# Patient Record
Sex: Male | Born: 1960 | Race: White | Hispanic: No | Marital: Single | Smoking: Current every day smoker
Health system: Southern US, Community
[De-identification: ages and names within clinical notes are randomized; demographics above are authoritative.]

---

## 2017-04-02 DIAGNOSIS — F1010 Alcohol abuse, uncomplicated: Secondary | ICD-10-CM | POA: Diagnosis not present

## 2017-04-02 DIAGNOSIS — K639 Disease of intestine, unspecified: Secondary | ICD-10-CM | POA: Diagnosis not present

## 2017-04-02 DIAGNOSIS — R109 Unspecified abdominal pain: Secondary | ICD-10-CM

## 2017-04-02 DIAGNOSIS — E876 Hypokalemia: Secondary | ICD-10-CM

## 2017-04-03 DIAGNOSIS — F1010 Alcohol abuse, uncomplicated: Secondary | ICD-10-CM | POA: Diagnosis not present

## 2017-04-03 DIAGNOSIS — K639 Disease of intestine, unspecified: Secondary | ICD-10-CM | POA: Diagnosis not present

## 2017-04-03 DIAGNOSIS — R109 Unspecified abdominal pain: Secondary | ICD-10-CM | POA: Diagnosis not present

## 2017-04-03 DIAGNOSIS — E876 Hypokalemia: Secondary | ICD-10-CM | POA: Diagnosis not present

## 2017-04-04 DIAGNOSIS — E876 Hypokalemia: Secondary | ICD-10-CM | POA: Diagnosis not present

## 2017-04-04 DIAGNOSIS — R109 Unspecified abdominal pain: Secondary | ICD-10-CM | POA: Diagnosis not present

## 2017-04-04 DIAGNOSIS — K639 Disease of intestine, unspecified: Secondary | ICD-10-CM | POA: Diagnosis not present

## 2017-04-04 DIAGNOSIS — F1010 Alcohol abuse, uncomplicated: Secondary | ICD-10-CM | POA: Diagnosis not present

## 2017-04-05 DIAGNOSIS — R109 Unspecified abdominal pain: Secondary | ICD-10-CM | POA: Diagnosis not present

## 2017-04-05 DIAGNOSIS — E876 Hypokalemia: Secondary | ICD-10-CM | POA: Diagnosis not present

## 2017-04-05 DIAGNOSIS — F1010 Alcohol abuse, uncomplicated: Secondary | ICD-10-CM | POA: Diagnosis not present

## 2017-04-05 DIAGNOSIS — K639 Disease of intestine, unspecified: Secondary | ICD-10-CM | POA: Diagnosis not present

## 2017-04-06 DIAGNOSIS — E876 Hypokalemia: Secondary | ICD-10-CM | POA: Diagnosis not present

## 2017-04-06 DIAGNOSIS — R109 Unspecified abdominal pain: Secondary | ICD-10-CM | POA: Diagnosis not present

## 2017-04-06 DIAGNOSIS — F1010 Alcohol abuse, uncomplicated: Secondary | ICD-10-CM | POA: Diagnosis not present

## 2017-04-06 DIAGNOSIS — K639 Disease of intestine, unspecified: Secondary | ICD-10-CM | POA: Diagnosis not present

## 2017-04-07 DIAGNOSIS — K639 Disease of intestine, unspecified: Secondary | ICD-10-CM | POA: Diagnosis not present

## 2017-04-07 DIAGNOSIS — F1010 Alcohol abuse, uncomplicated: Secondary | ICD-10-CM | POA: Diagnosis not present

## 2017-04-07 DIAGNOSIS — E876 Hypokalemia: Secondary | ICD-10-CM | POA: Diagnosis not present

## 2017-04-07 DIAGNOSIS — R109 Unspecified abdominal pain: Secondary | ICD-10-CM | POA: Diagnosis not present

## 2017-04-08 DIAGNOSIS — K639 Disease of intestine, unspecified: Secondary | ICD-10-CM | POA: Diagnosis not present

## 2017-04-08 DIAGNOSIS — R109 Unspecified abdominal pain: Secondary | ICD-10-CM | POA: Diagnosis not present

## 2017-04-08 DIAGNOSIS — F1010 Alcohol abuse, uncomplicated: Secondary | ICD-10-CM | POA: Diagnosis not present

## 2017-04-08 DIAGNOSIS — E876 Hypokalemia: Secondary | ICD-10-CM | POA: Diagnosis not present

## 2017-04-09 DIAGNOSIS — R109 Unspecified abdominal pain: Secondary | ICD-10-CM | POA: Diagnosis not present

## 2017-04-09 DIAGNOSIS — E876 Hypokalemia: Secondary | ICD-10-CM | POA: Diagnosis not present

## 2017-04-09 DIAGNOSIS — K639 Disease of intestine, unspecified: Secondary | ICD-10-CM | POA: Diagnosis not present

## 2017-04-09 DIAGNOSIS — F1010 Alcohol abuse, uncomplicated: Secondary | ICD-10-CM | POA: Diagnosis not present

## 2017-04-10 DIAGNOSIS — F1010 Alcohol abuse, uncomplicated: Secondary | ICD-10-CM | POA: Diagnosis not present

## 2017-04-10 DIAGNOSIS — K639 Disease of intestine, unspecified: Secondary | ICD-10-CM | POA: Diagnosis not present

## 2017-04-10 DIAGNOSIS — R109 Unspecified abdominal pain: Secondary | ICD-10-CM | POA: Diagnosis not present

## 2017-04-10 DIAGNOSIS — E876 Hypokalemia: Secondary | ICD-10-CM | POA: Diagnosis not present

## 2017-04-11 ENCOUNTER — Inpatient Hospital Stay (HOSPITAL_COMMUNITY): Payer: Medicaid Other

## 2017-04-11 ENCOUNTER — Inpatient Hospital Stay (HOSPITAL_COMMUNITY)
Admission: AD | Admit: 2017-04-11 | Discharge: 2017-05-20 | DRG: 853 | Disposition: A | Discharge status: Home / Self Care | Payer: Medicaid Other | Source: Other Acute Inpatient Hospital | Attending: Internal Medicine | Admitting: Internal Medicine

## 2017-04-11 DIAGNOSIS — R0600 Dyspnea, unspecified: Secondary | ICD-10-CM

## 2017-04-11 DIAGNOSIS — J80 Acute respiratory distress syndrome: Secondary | ICD-10-CM | POA: Diagnosis not present

## 2017-04-11 DIAGNOSIS — B3749 Other urogenital candidiasis: Secondary | ICD-10-CM | POA: Diagnosis not present

## 2017-04-11 DIAGNOSIS — J811 Chronic pulmonary edema: Secondary | ICD-10-CM

## 2017-04-11 DIAGNOSIS — E162 Hypoglycemia, unspecified: Secondary | ICD-10-CM | POA: Diagnosis not present

## 2017-04-11 DIAGNOSIS — Z515 Encounter for palliative care: Secondary | ICD-10-CM

## 2017-04-11 DIAGNOSIS — N189 Chronic kidney disease, unspecified: Secondary | ICD-10-CM | POA: Diagnosis present

## 2017-04-11 DIAGNOSIS — Z0189 Encounter for other specified special examinations: Secondary | ICD-10-CM | POA: Diagnosis not present

## 2017-04-11 DIAGNOSIS — F10231 Alcohol dependence with withdrawal delirium: Secondary | ICD-10-CM | POA: Diagnosis present

## 2017-04-11 DIAGNOSIS — R000 Tachycardia, unspecified: Secondary | ICD-10-CM

## 2017-04-11 DIAGNOSIS — R0902 Hypoxemia: Secondary | ICD-10-CM | POA: Diagnosis not present

## 2017-04-11 DIAGNOSIS — B9620 Unspecified Escherichia coli [E. coli] as the cause of diseases classified elsewhere: Secondary | ICD-10-CM | POA: Diagnosis not present

## 2017-04-11 DIAGNOSIS — R0689 Other abnormalities of breathing: Secondary | ICD-10-CM

## 2017-04-11 DIAGNOSIS — R64 Cachexia: Secondary | ICD-10-CM | POA: Diagnosis present

## 2017-04-11 DIAGNOSIS — J9601 Acute respiratory failure with hypoxia: Secondary | ICD-10-CM | POA: Diagnosis not present

## 2017-04-11 DIAGNOSIS — Y848 Other medical procedures as the cause of abnormal reaction of the patient, or of later complication, without mention of misadventure at the time of the procedure: Secondary | ICD-10-CM | POA: Diagnosis not present

## 2017-04-11 DIAGNOSIS — E43 Unspecified severe protein-calorie malnutrition: Secondary | ICD-10-CM | POA: Diagnosis present

## 2017-04-11 DIAGNOSIS — D638 Anemia in other chronic diseases classified elsewhere: Secondary | ICD-10-CM

## 2017-04-11 DIAGNOSIS — Z720 Tobacco use: Secondary | ICD-10-CM | POA: Diagnosis not present

## 2017-04-11 DIAGNOSIS — Z9889 Other specified postprocedural states: Secondary | ICD-10-CM | POA: Diagnosis not present

## 2017-04-11 DIAGNOSIS — K660 Peritoneal adhesions (postprocedural) (postinfection): Secondary | ICD-10-CM | POA: Diagnosis present

## 2017-04-11 DIAGNOSIS — K651 Peritoneal abscess: Secondary | ICD-10-CM | POA: Diagnosis not present

## 2017-04-11 DIAGNOSIS — T82898A Other specified complication of vascular prosthetic devices, implants and grafts, initial encounter: Secondary | ICD-10-CM | POA: Diagnosis present

## 2017-04-11 DIAGNOSIS — T17908A Unspecified foreign body in respiratory tract, part unspecified causing other injury, initial encounter: Secondary | ICD-10-CM

## 2017-04-11 DIAGNOSIS — G8918 Other acute postprocedural pain: Secondary | ICD-10-CM

## 2017-04-11 DIAGNOSIS — R109 Unspecified abdominal pain: Secondary | ICD-10-CM | POA: Diagnosis not present

## 2017-04-11 DIAGNOSIS — N179 Acute kidney failure, unspecified: Secondary | ICD-10-CM

## 2017-04-11 DIAGNOSIS — Z681 Body mass index (BMI) 19 or less, adult: Secondary | ICD-10-CM | POA: Diagnosis not present

## 2017-04-11 DIAGNOSIS — Z452 Encounter for adjustment and management of vascular access device: Secondary | ICD-10-CM | POA: Diagnosis not present

## 2017-04-11 DIAGNOSIS — R0682 Tachypnea, not elsewhere classified: Secondary | ICD-10-CM | POA: Diagnosis not present

## 2017-04-11 DIAGNOSIS — K567 Ileus, unspecified: Secondary | ICD-10-CM | POA: Diagnosis not present

## 2017-04-11 DIAGNOSIS — D72829 Elevated white blood cell count, unspecified: Secondary | ICD-10-CM

## 2017-04-11 DIAGNOSIS — R188 Other ascites: Secondary | ICD-10-CM | POA: Diagnosis not present

## 2017-04-11 DIAGNOSIS — L8990 Pressure ulcer of unspecified site, unspecified stage: Secondary | ICD-10-CM | POA: Diagnosis present

## 2017-04-11 DIAGNOSIS — R6521 Severe sepsis with septic shock: Secondary | ICD-10-CM | POA: Diagnosis present

## 2017-04-11 DIAGNOSIS — Z0000 Encounter for general adult medical examination without abnormal findings: Secondary | ICD-10-CM | POA: Diagnosis not present

## 2017-04-11 DIAGNOSIS — K5720 Diverticulitis of large intestine with perforation and abscess without bleeding: Secondary | ICD-10-CM | POA: Diagnosis not present

## 2017-04-11 DIAGNOSIS — X58XXXA Exposure to other specified factors, initial encounter: Secondary | ICD-10-CM | POA: Diagnosis not present

## 2017-04-11 DIAGNOSIS — E876 Hypokalemia: Secondary | ICD-10-CM | POA: Diagnosis present

## 2017-04-11 DIAGNOSIS — E8770 Fluid overload, unspecified: Secondary | ICD-10-CM | POA: Diagnosis present

## 2017-04-11 DIAGNOSIS — J690 Pneumonitis due to inhalation of food and vomit: Secondary | ICD-10-CM | POA: Diagnosis not present

## 2017-04-11 DIAGNOSIS — E7220 Disorder of urea cycle metabolism, unspecified: Secondary | ICD-10-CM | POA: Diagnosis not present

## 2017-04-11 DIAGNOSIS — K750 Abscess of liver: Secondary | ICD-10-CM | POA: Diagnosis present

## 2017-04-11 DIAGNOSIS — K632 Fistula of intestine: Secondary | ICD-10-CM | POA: Diagnosis present

## 2017-04-11 DIAGNOSIS — J189 Pneumonia, unspecified organism: Secondary | ICD-10-CM | POA: Diagnosis not present

## 2017-04-11 DIAGNOSIS — L988 Other specified disorders of the skin and subcutaneous tissue: Secondary | ICD-10-CM | POA: Diagnosis present

## 2017-04-11 DIAGNOSIS — F17210 Nicotine dependence, cigarettes, uncomplicated: Secondary | ICD-10-CM | POA: Diagnosis present

## 2017-04-11 DIAGNOSIS — R0603 Acute respiratory distress: Secondary | ICD-10-CM | POA: Diagnosis not present

## 2017-04-11 DIAGNOSIS — F1010 Alcohol abuse, uncomplicated: Secondary | ICD-10-CM | POA: Diagnosis not present

## 2017-04-11 DIAGNOSIS — N170 Acute kidney failure with tubular necrosis: Secondary | ICD-10-CM | POA: Diagnosis present

## 2017-04-11 DIAGNOSIS — R0602 Shortness of breath: Secondary | ICD-10-CM | POA: Diagnosis not present

## 2017-04-11 DIAGNOSIS — D62 Acute posthemorrhagic anemia: Secondary | ICD-10-CM | POA: Diagnosis not present

## 2017-04-11 DIAGNOSIS — Y95 Nosocomial condition: Secondary | ICD-10-CM | POA: Diagnosis not present

## 2017-04-11 DIAGNOSIS — G4700 Insomnia, unspecified: Secondary | ICD-10-CM | POA: Diagnosis not present

## 2017-04-11 DIAGNOSIS — Z978 Presence of other specified devices: Secondary | ICD-10-CM

## 2017-04-11 DIAGNOSIS — J90 Pleural effusion, not elsewhere classified: Secondary | ICD-10-CM | POA: Diagnosis present

## 2017-04-11 DIAGNOSIS — J449 Chronic obstructive pulmonary disease, unspecified: Secondary | ICD-10-CM | POA: Diagnosis present

## 2017-04-11 DIAGNOSIS — Z781 Physical restraint status: Secondary | ICD-10-CM

## 2017-04-11 DIAGNOSIS — R627 Adult failure to thrive: Secondary | ICD-10-CM | POA: Diagnosis present

## 2017-04-11 DIAGNOSIS — D649 Anemia, unspecified: Secondary | ICD-10-CM | POA: Diagnosis not present

## 2017-04-11 DIAGNOSIS — Z9289 Personal history of other medical treatment: Secondary | ICD-10-CM | POA: Diagnosis not present

## 2017-04-11 DIAGNOSIS — T17918A Gastric contents in respiratory tract, part unspecified causing other injury, initial encounter: Secondary | ICD-10-CM | POA: Diagnosis not present

## 2017-04-11 DIAGNOSIS — A419 Sepsis, unspecified organism: Principal | ICD-10-CM | POA: Diagnosis present

## 2017-04-11 DIAGNOSIS — G9340 Encephalopathy, unspecified: Secondary | ICD-10-CM | POA: Diagnosis not present

## 2017-04-11 DIAGNOSIS — R197 Diarrhea, unspecified: Secondary | ICD-10-CM | POA: Diagnosis not present

## 2017-04-11 DIAGNOSIS — J939 Pneumothorax, unspecified: Secondary | ICD-10-CM

## 2017-04-11 DIAGNOSIS — R34 Anuria and oliguria: Secondary | ICD-10-CM | POA: Diagnosis not present

## 2017-04-11 DIAGNOSIS — T17908S Unspecified foreign body in respiratory tract, part unspecified causing other injury, sequela: Secondary | ICD-10-CM | POA: Diagnosis not present

## 2017-04-11 DIAGNOSIS — T17908D Unspecified foreign body in respiratory tract, part unspecified causing other injury, subsequent encounter: Secondary | ICD-10-CM | POA: Diagnosis not present

## 2017-04-11 DIAGNOSIS — K639 Disease of intestine, unspecified: Secondary | ICD-10-CM | POA: Diagnosis not present

## 2017-04-11 DIAGNOSIS — F10239 Alcohol dependence with withdrawal, unspecified: Secondary | ICD-10-CM | POA: Diagnosis not present

## 2017-04-11 LAB — POCT I-STAT 3, ART BLOOD GAS (G3+)
BICARBONATE: 27.3 mmol/L (ref 20.0–28.0)
O2 SAT: 97 %
TCO2: 29 mmol/L (ref 22–32)
pCO2 arterial: 55 mmHg — ABNORMAL HIGH (ref 32.0–48.0)
pH, Arterial: 7.304 — ABNORMAL LOW (ref 7.350–7.450)
pO2, Arterial: 103 mmHg (ref 83.0–108.0)

## 2017-04-11 LAB — BASIC METABOLIC PANEL
ANION GAP: 10 (ref 5–15)
BUN: 31 mg/dL — ABNORMAL HIGH (ref 6–20)
CHLORIDE: 97 mmol/L — AB (ref 101–111)
CO2: 26 mmol/L (ref 22–32)
Calcium: 7.6 mg/dL — ABNORMAL LOW (ref 8.9–10.3)
Creatinine, Ser: 3.61 mg/dL — ABNORMAL HIGH (ref 0.61–1.24)
GFR calc non Af Amer: 18 mL/min — ABNORMAL LOW (ref >60)
GFR, EST AFRICAN AMERICAN: 20 mL/min — AB (ref >60)
Glucose, Bld: 143 mg/dL — ABNORMAL HIGH (ref 65–99)
POTASSIUM: 3.6 mmol/L (ref 3.5–5.1)
Sodium: 133 mmol/L — ABNORMAL LOW (ref 135–145)

## 2017-04-11 LAB — CBC WITH DIFFERENTIAL/PLATELET
Basophils Absolute: 0 10*3/uL (ref 0.0–0.1)
Basophils Relative: 0 %
Eosinophils Absolute: 0.1 10*3/uL (ref 0.0–0.7)
Eosinophils Relative: 1 %
HEMATOCRIT: 24 % — AB (ref 39.0–52.0)
HEMOGLOBIN: 7.9 g/dL — AB (ref 13.0–17.0)
LYMPHS ABS: 0.7 10*3/uL (ref 0.7–4.0)
LYMPHS PCT: 5 %
MCH: 31.7 pg (ref 26.0–34.0)
MCHC: 32.9 g/dL (ref 30.0–36.0)
MCV: 96.4 fL (ref 78.0–100.0)
MONOS PCT: 5 %
Monocytes Absolute: 0.7 10*3/uL (ref 0.1–1.0)
NEUTROS ABS: 12.6 10*3/uL — AB (ref 1.7–7.7)
NEUTROS PCT: 89 %
Platelets: 261 10*3/uL (ref 150–400)
RBC: 2.49 MIL/uL — ABNORMAL LOW (ref 4.22–5.81)
RDW: 17.4 % — ABNORMAL HIGH (ref 11.5–15.5)
WBC: 14.2 10*3/uL — ABNORMAL HIGH (ref 4.0–10.5)

## 2017-04-11 LAB — MRSA PCR SCREENING: MRSA by PCR: NEGATIVE

## 2017-04-11 LAB — GLUCOSE, CAPILLARY
GLUCOSE-CAPILLARY: 136 mg/dL — AB (ref 65–99)
Glucose-Capillary: 133 mg/dL — ABNORMAL HIGH (ref 65–99)

## 2017-04-11 MED ORDER — NOREPINEPHRINE BITARTRATE 1 MG/ML IV SOLN
0.0000 ug/min | INTRAVENOUS | Status: DC
  Administered 2017-04-11 (×2): 2 ug/min via INTRAVENOUS
  Filled 2017-04-11: qty 4

## 2017-04-11 MED ORDER — ASPIRIN 300 MG RE SUPP: 300.0000 mg | RECTAL | Status: DC

## 2017-04-11 MED ORDER — PANTOPRAZOLE SODIUM 40 MG IV SOLR
40.0000 mg | Freq: Every day | INTRAVENOUS | Status: DC
  Administered 2017-04-12 – 2017-04-16 (×5): 40 mg via INTRAVENOUS
  Filled 2017-04-11 (×6): qty 40

## 2017-04-11 MED ORDER — SODIUM CHLORIDE 0.9 % IV SOLN
0.4000 ug/kg/h | INTRAVENOUS | Status: DC
  Administered 2017-04-11: 0.5 ug/kg/h via INTRAVENOUS
  Filled 2017-04-11: qty 2

## 2017-04-11 MED ORDER — LEVOFLOXACIN IN D5W 750 MG/150ML IV SOLN: 750.0000 mg | INTRAVENOUS | Status: DC

## 2017-04-11 MED ORDER — DEXTROSE 10 % IV SOLN
INTRAVENOUS | Status: DC
  Administered 2017-04-11: 40 mL/h via INTRAVENOUS

## 2017-04-11 MED ORDER — FENTANYL CITRATE (PF) 100 MCG/2ML IJ SOLN
50.0000 ug | Freq: Once | INTRAMUSCULAR | Status: AC
  Administered 2017-04-11: 50 ug via INTRAVENOUS

## 2017-04-11 MED ORDER — ASPIRIN 81 MG PO CHEW: 324.0000 mg | CHEWABLE_TABLET | ORAL | Status: DC

## 2017-04-11 MED ORDER — MIDAZOLAM HCL 2 MG/2ML IJ SOLN
2.0000 mg | INTRAMUSCULAR | Status: DC | PRN
  Administered 2017-04-12 – 2017-04-17 (×27): 2 mg via INTRAVENOUS
  Filled 2017-04-11 (×27): qty 2

## 2017-04-11 MED ORDER — FENTANYL CITRATE (PF) 100 MCG/2ML IJ SOLN
INTRAMUSCULAR | Status: AC
  Filled 2017-04-11: qty 2

## 2017-04-11 MED ORDER — ALTEPLASE 2 MG IJ SOLR
2.0000 mg | Freq: Once | INTRAMUSCULAR | Status: AC
  Administered 2017-04-11: 2 mg
  Filled 2017-04-11: qty 2

## 2017-04-11 MED ORDER — SODIUM CHLORIDE 0.9 % IV SOLN
250.0000 mL | INTRAVENOUS | Status: DC | PRN
  Administered 2017-04-29: 250 mL via INTRAVENOUS

## 2017-04-11 MED ORDER — DEXMEDETOMIDINE HCL IN NACL 400 MCG/100ML IV SOLN
0.4000 ug/kg/h | INTRAVENOUS | Status: DC
  Administered 2017-04-11: 1.4 ug/kg/h via INTRAVENOUS
  Administered 2017-04-11: 1.2 ug/kg/h via INTRAVENOUS
  Administered 2017-04-12 (×3): 1.1 ug/kg/h via INTRAVENOUS
  Administered 2017-04-12 (×2): 1.7 ug/kg/h via INTRAVENOUS
  Administered 2017-04-13: 0.6 ug/kg/h via INTRAVENOUS
  Administered 2017-04-13: 1.2 ug/kg/h via INTRAVENOUS
  Administered 2017-04-13 (×2): 1 ug/kg/h via INTRAVENOUS
  Administered 2017-04-14 (×2): 1.2 ug/kg/h via INTRAVENOUS
  Administered 2017-04-14: 1.4 ug/kg/h via INTRAVENOUS
  Administered 2017-04-14: 1 ug/kg/h via INTRAVENOUS
  Administered 2017-04-14: 1.4 ug/kg/h via INTRAVENOUS
  Administered 2017-04-15: 1.5 ug/kg/h via INTRAVENOUS
  Administered 2017-04-15: 1.2 ug/kg/h via INTRAVENOUS
  Administered 2017-04-15: 1.6 ug/kg/h via INTRAVENOUS
  Administered 2017-04-15: 1.5 ug/kg/h via INTRAVENOUS
  Administered 2017-04-15 – 2017-04-16 (×2): 1.6 ug/kg/h via INTRAVENOUS
  Administered 2017-04-16: 1 ug/kg/h via INTRAVENOUS
  Administered 2017-04-16: 1.6 ug/kg/h via INTRAVENOUS
  Administered 2017-04-16: 1.4 ug/kg/h via INTRAVENOUS
  Administered 2017-04-16: 1.6 ug/kg/h via INTRAVENOUS
  Administered 2017-04-16: 1.4 ug/kg/h via INTRAVENOUS
  Administered 2017-04-17 (×3): 1.6 ug/kg/h via INTRAVENOUS
  Filled 2017-04-11 (×30): qty 100

## 2017-04-11 MED ORDER — ASPIRIN 81 MG PO CHEW
324.0000 mg | CHEWABLE_TABLET | ORAL | Status: AC
  Administered 2017-04-11: 324 mg
  Filled 2017-04-11: qty 4

## 2017-04-11 MED ORDER — SODIUM CHLORIDE 0.9 % IV SOLN
INTRAVENOUS | Status: DC
  Administered 2017-04-11 – 2017-04-14 (×2): via INTRAVENOUS

## 2017-04-11 MED ORDER — ASPIRIN 300 MG RE SUPP: 300.0000 mg | RECTAL | Status: AC

## 2017-04-11 MED ORDER — ENOXAPARIN SODIUM 30 MG/0.3ML ~~LOC~~ SOLN
30.0000 mg | SUBCUTANEOUS | Status: DC
  Filled 2017-04-11: qty 0.3

## 2017-04-11 NOTE — Progress Notes (Signed)
Pt arrived on the unit. Vent 60/5/16/600 NG lws (green), foley, abdominal drain tan/green drainage,  Flexiseal, restraints IVs: Precedex at 1, TPN at 40 ml, Levo at 2. VS stable 123/89, 75, 92% 18 RASS is -2 to 2 during admission  Restraints are removed, Levo paused.    [facility]link is notified. Waiting on MD and orders

## 2017-04-11 NOTE — Progress Notes (Signed)
eLink Physician-Brief Progress Note Patient Name: Dominic Phillips DOB: 09-Feb-1961 MRN: [id number]   Date of Service  04/11/2017  HPI/Events of Note  Agitation - Currently on Precedex IV infusion at 1.2 mcg/kg/hour.   eICU Interventions  Will order: 1. Increase Precedex IV infusion ceiling to 1.7 mcg/kg/hour. 2. Versed 2 mg IV Q 2 hours PRN sedation or agitation.      Intervention Category Major Interventions: Other: Minor Interventions: Agitation / anxiety - evaluation and management  Lysle Dingwall 04/11/2017, 9:13 PM

## 2017-04-11 NOTE — Progress Notes (Signed)
Family state that pt drinks 12 beers a day, takes "a lot Goody powders". Lives and caregiver to his 54 yo son who is disable and total care. Care giver helps at times. Son is in care of caregiver.

## 2017-04-11 NOTE — H&P (Signed)
PULMONARY / CRITICAL CARE MEDICINE   Name: Dominic Phillips MRN: [id number] DOB: 1961-03-03    ADMISSION DATE:  04/11/2017 CONSULTATION DATE:  04/11/2017  REFERRING MD:  Emeline Gins Elgergawy  CHIEF COMPLAINT:  ARDS secondary to aspiration pneumonia complicating a diverticular abscess  HISTORY OF PRESENT ILLNESS:   This is a 56 y.o. W M transferred from [facility] for management of ARDS and progressive renal insufficiency. This gentleman has a long history of heavy ETOH abuse (~ 18 beers per day) and presented to [place] on 04/02/2017 with a c/o of abdominal pain without fevers, leukocytosis or diarrhea. An abd CT revealed pericolonic inflammation/fluid collection, for which he was admitted for diverticular abscess. Was consulted by Gen Surg who suggested placement of a catheter by interventional radiology; subsequent cultures grew E. Coli, for which Zosyn was initiated. Hospital course was complicated by the development of DTs, treated with 1 beer tid and Ativan prn. He slowly improved but worsened on 10/23 with increased WBC and progressive hypoxemia. Repeat abdominal CT showed improving abscess but developing SBO and multilobar pneumonia. Hypoxemia could not be corrected with BiPAP, along with worsening renal function such that he was intubated on 10/25 and required pressors for hemodynamic support. CT chest/abdomen/pelvis on 10/26 showed bilateral pneumonia with volume overload; no change in the RLQ percutaneous drain catheter. With rising creat to 3.1 and 290 cc U/O over 24 hours, transfer was requested.   PAST MEDICAL HISTORY :  He  has no past medical history on file. Daughter is not knowledgeable about the patient's Phx other than his heavy ETOH abuse and heavy smoking  PAST SURGICAL HISTORY: He  has no past surgical history on file.  No Known Allergies  No current facility-administered medications on file prior to encounter.    No current outpatient prescriptions on file prior to  encounter.    FAMILY HISTORY:  His has no family status information on file.    SOCIAL HISTORY: He  has been a 2-3ppd smoker for many years per the daughter  REVIEW OF SYSTEMS:   Patient is intubated and daughter is unable to provide  SUBJECTIVE:  Sedated  VITAL SIGNS: BP (!) 80/61   Pulse 70   Temp 98.5 F (36.9 C) (Oral)   Resp 17   Ht 5\' 11"  (1.803 m)   Wt 68.3 kg (150 lb 9.2 oz)   SpO2 95%   BMI 21.00 kg/m   HEMODYNAMICS: On Levophed at 2 mcg    VENTILATOR SETTINGS: Vent Mode: PRVC FiO2 (%):  [50 %-60 %] 50 % Set Rate:  [16 bmp] 16 bmp Vt Set:  [600 mL] 600 mL PEEP:  [5 cmH20] 5 cmH20 Plateau Pressure:  [22 cmH20-24 cmH20] 22 cmH20  INTAKE / OUTPUT: I/O last 3 completed shifts: In: 219.6 [I.V.:89.6; Other:130] Out: 110 [Urine:100; Stool:10]  PHYSICAL EXAMINATION: General:  WD slightly thin W M sedated, intubated and ventilated Neuro:  No focal findings HEENT:  PERRL. ETT/OGT in place Cardiovascular:  RRR, no murmurs. Symmetric pulses without bruits but decreased in feet Lungs:  No crackles anteriorly and laterally Abdomen:  Somewhat tense with dec. BS. Unable to appreciate any organomegaly Musculoskeletal:  No joint deformities Skin:  Tr-1+ edema  LABS:  BMET  Recent Labs Lab 04/11/17 1850  NA 133*  K 3.6  CL 97*  CO2 26  BUN 31*  CREATININE 3.61*  GLUCOSE 143*    Electrolytes  Recent Labs Lab 04/11/17 1850  CALCIUM 7.6*    CBC  Recent Labs Lab 04/11/17  1850  WBC 14.2*  HGB 7.9*  HCT 24.0*  PLT 261    Coag's No results for input(s): APTT, INR in the last 168 hours.  Sepsis Markers No results for input(s): LATICACIDVEN, PROCALCITON, O2SATVEN in the last 168 hours.  ABG  Recent Labs Lab 04/11/17 1703  PHART 7.304*  PCO2ART 55.0*  PO2ART 103.0    Liver Enzymes No results for input(s): AST, ALT, ALKPHOS, BILITOT, ALBUMIN in the last 168 hours.  Cardiac Enzymes No results for input(s): TROPONINI, PROBNP in  the last 168 hours.  Glucose  Recent Labs Lab 04/11/17 1435 04/11/17 2043  GLUCAP 133* 136*    Imaging Dg Chest 1 View  Result Date: 04/11/2017 CLINICAL DATA:  Intubation. EXAM: CHEST 1 VIEW COMPARISON:  None. FINDINGS: Endotracheal tube is in place with tip approximately 6.0 cm above the carina. A nasogastric tube is in place with tip to level of the stomach. Left IJ central line tip overlies the level of the brachiocephalic confluence with the superior vena cava. Heart size is probably normal. There are diffuse pulmonary opacities throughout the lungs bilaterally, more confluent at the right lung base. Moderate right pleural effusion. IMPRESSION: 1. Diffuse bilateral pulmonary infiltrates, right greater than left. 2. Considerations include asymmetric pulmonary edema and/or infectious process. Electronically Signed   By: Nolon Nations M.D.   On: 04/11/2017 15:19   Dg Abd Portable 1v  Result Date: 04/11/2017 CLINICAL DATA:  Encounter for imaging study to confirm orogastric tube placement. EXAM: PORTABLE ABDOMEN - 1 VIEW COMPARISON:  None. FINDINGS: Nasogastric tube is in the left upper abdomen probably in the gastric fundus. There is contrast in the colon but there are dilated loops of small bowel. Small bowel loops measure up to 4.3 cm. Patchy densities in the lower chest. IMPRESSION: Nasogastric tube is in the proximal stomach region. Dilated loops of small bowel in the abdomen. Findings are concerning for at least a partial small bowel obstruction. No comparison abdominal radiographs. Incompletely visualized lung disease. Electronically Signed   By: Markus Daft M.D.   On: 04/11/2017 15:32     STUDIES:  PCXR reviewed  CULTURES: None  ANTIBIOTICS: Will continue Zosyn + Levo  SIGNIFICANT EVENTS: None  LINES/TUBES: ETT; L IJ, Foley  DISCUSSION: PCXR appears more compatible with interstitial edema as I do not see definite acinar shadows. Will adjust vent settings to minimize  FIO2 but increase VE to correct hypercapnea and respiratory acidosis. 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  ASSESSMENT / PLAN:  PULMONARY A: Currently on 50% FIO2 with adequate sats P:   Adjust VE to correct resp. accidosis  CARDIOVASCULAR A:  Was transiently weaned off Levophed, but per RN had to restart P:  Will check CVP to ensure adequate volume status  RENAL A:   AKI P:   Will recheck U/A and urine lytes. Nephrol consult  GASTROINTESTINAL A:   Diverticular abscess P:   On GI prophylaxis. Consult with CCS gen surgery for continued f/u  HEMATOLOGIC A:   [place] anemia, likely secondary to acl hx and acute disease. No indication for tx P:  Follow serially  INFECTIOUS A:   Leukocytosis and procalcitonin of 3.67 yesterday P:   Continue Zosyn + Levo  ENDOCRINE A:   No known prior hx.   P:   However, with continued need for pressors, will check cortisol   NEUROLOGIC A:   Head CT 10/26: No intracranial mass, hemorrhage or extra-axial fluid  collection P:   RASS goal: -2    FAMILY  - Updates: Spoke with family regarding plans     Pulmonary and [place] Pager: [phone]  04/11/2017, 9:15 PM

## 2017-04-11 NOTE — Progress Notes (Signed)
Per Dr. Rennis Harding to keep TPN going until it is run out. Pharmacy was reached about TPN by MD per MD.

## 2017-04-12 ENCOUNTER — Inpatient Hospital Stay (HOSPITAL_COMMUNITY): Payer: Medicaid Other

## 2017-04-12 ENCOUNTER — Encounter (HOSPITAL_COMMUNITY): Payer: Self-pay | Admitting: *Deleted

## 2017-04-12 LAB — COMPREHENSIVE METABOLIC PANEL
ALT: 12 U/L — AB (ref 17–63)
ANION GAP: 11 (ref 5–15)
AST: 44 U/L — ABNORMAL HIGH (ref 15–41)
Albumin: 1.5 g/dL — ABNORMAL LOW (ref 3.5–5.0)
Alkaline Phosphatase: 201 U/L — ABNORMAL HIGH (ref 38–126)
BUN: 35 mg/dL — ABNORMAL HIGH (ref 6–20)
CHLORIDE: 95 mmol/L — AB (ref 101–111)
CO2: 26 mmol/L (ref 22–32)
CREATININE: 3.93 mg/dL — AB (ref 0.61–1.24)
Calcium: 7.9 mg/dL — ABNORMAL LOW (ref 8.9–10.3)
GFR, EST AFRICAN AMERICAN: 18 mL/min — AB (ref >60)
GFR, EST NON AFRICAN AMERICAN: 16 mL/min — AB (ref >60)
Glucose, Bld: 139 mg/dL — ABNORMAL HIGH (ref 65–99)
Potassium: 3.7 mmol/L (ref 3.5–5.1)
SODIUM: 132 mmol/L — AB (ref 135–145)
Total Bilirubin: 0.7 mg/dL (ref 0.3–1.2)
Total Protein: 5.8 g/dL — ABNORMAL LOW (ref 6.5–8.1)

## 2017-04-12 LAB — POCT I-STAT 3, ART BLOOD GAS (G3+)
ACID-BASE DEFICIT: 1 mmol/L (ref 0.0–2.0)
BICARBONATE: 25.3 mmol/L (ref 20.0–28.0)
Bicarbonate: 26.3 mmol/L (ref 20.0–28.0)
O2 SAT: 98 %
O2 SAT: 98 %
PCO2 ART: 48.4 mmHg — AB (ref 32.0–48.0)
PCO2 ART: 47.3 mmHg (ref 32.0–48.0)
PO2 ART: 108 mmHg (ref 83.0–108.0)
PO2 ART: 106 mmHg (ref 83.0–108.0)
Patient temperature: 98.6
TCO2: 28 mmol/L (ref 22–32)
TCO2: 27 mmol/L (ref 22–32)
pH, Arterial: 7.344 — ABNORMAL LOW (ref 7.350–7.450)
pH, Arterial: 7.337 — ABNORMAL LOW (ref 7.350–7.450)

## 2017-04-12 LAB — ECHOCARDIOGRAM COMPLETE
CHL CUP DOP CALC LVOT VTI: 22 cm
CHL CUP MV DEC (S): 135
E decel time: 135 msec
EERAT: 6.31
HEIGHTINCHES: 71 in
LA vol A4C: 41.9 ml
LA vol index: 20.9 mL/m2
LAVOL: 39.7 mL
LV E/e' medial: 6.31
LV e' LATERAL: 13.1 cm/s
LVEEAVG: 6.31
LVOTPV: 90.7 cm/s
MV pk A vel: 93.8 m/s
MVPG: 3 mmHg
MVPKEVEL: 82.7 m/s
RV LATERAL S' VELOCITY: 11.5 cm/s
TAPSE: 20.7 mm
TDI e' lateral: 13.1
TDI e' medial: 10.6
Weight: 2539.7 oz

## 2017-04-12 LAB — DIFFERENTIAL
BASOS PCT: 0 %
Basophils Absolute: 0 10*3/uL (ref 0.0–0.1)
EOS ABS: 0.1 10*3/uL (ref 0.0–0.7)
Eosinophils Relative: 1 %
Lymphocytes Relative: 6 %
Lymphs Abs: 0.9 10*3/uL (ref 0.7–4.0)
MONO ABS: 0.6 10*3/uL (ref 0.1–1.0)
Monocytes Relative: 4 %
NEUTROS ABS: 13.1 10*3/uL — AB (ref 1.7–7.7)
NEUTROS PCT: 89 %

## 2017-04-12 LAB — CBC
HCT: 25.4 % — ABNORMAL LOW (ref 39.0–52.0)
Hemoglobin: 8.4 g/dL — ABNORMAL LOW (ref 13.0–17.0)
MCH: 31.2 pg (ref 26.0–34.0)
MCHC: 33.1 g/dL (ref 30.0–36.0)
MCV: 94.4 fL (ref 78.0–100.0)
PLATELETS: 257 10*3/uL (ref 150–400)
RBC: 2.69 MIL/uL — ABNORMAL LOW (ref 4.22–5.81)
RDW: 17.5 % — AB (ref 11.5–15.5)
WBC: 14.7 10*3/uL — AB (ref 4.0–10.5)

## 2017-04-12 LAB — GLUCOSE, CAPILLARY
Glucose-Capillary: 125 mg/dL — ABNORMAL HIGH (ref 65–99)
Glucose-Capillary: 130 mg/dL — ABNORMAL HIGH (ref 65–99)
Glucose-Capillary: 140 mg/dL — ABNORMAL HIGH (ref 65–99)
Glucose-Capillary: 144 mg/dL — ABNORMAL HIGH (ref 65–99)
Glucose-Capillary: 152 mg/dL — ABNORMAL HIGH (ref 65–99)

## 2017-04-12 LAB — BASIC METABOLIC PANEL
ANION GAP: 11 (ref 5–15)
BUN: 37 mg/dL — AB (ref 6–20)
CHLORIDE: 97 mmol/L — AB (ref 101–111)
CO2: 25 mmol/L (ref 22–32)
Calcium: 7.8 mg/dL — ABNORMAL LOW (ref 8.9–10.3)
Creatinine, Ser: 4.19 mg/dL — ABNORMAL HIGH (ref 0.61–1.24)
GFR calc Af Amer: 17 mL/min — ABNORMAL LOW (ref >60)
GFR, EST NON AFRICAN AMERICAN: 15 mL/min — AB (ref >60)
GLUCOSE: 126 mg/dL — AB (ref 65–99)
POTASSIUM: 3.5 mmol/L (ref 3.5–5.1)
Sodium: 133 mmol/L — ABNORMAL LOW (ref 135–145)

## 2017-04-12 LAB — PREALBUMIN

## 2017-04-12 LAB — MAGNESIUM: MAGNESIUM: 2.2 mg/dL (ref 1.7–2.4)

## 2017-04-12 LAB — TRIGLYCERIDES: Triglycerides: 214 mg/dL — ABNORMAL HIGH (ref <150)

## 2017-04-12 LAB — PHOSPHORUS: PHOSPHORUS: 2 mg/dL — AB (ref 2.5–4.6)

## 2017-04-12 MED ORDER — SODIUM CHLORIDE 0.9% FLUSH
10.0000 mL | INTRAVENOUS | Status: DC | PRN
  Administered 2017-04-13: 10 mL
  Filled 2017-04-12: qty 40

## 2017-04-12 MED ORDER — FOLIC ACID 5 MG/ML IJ SOLN
1.0000 mg | Freq: Every day | INTRAMUSCULAR | Status: AC
  Administered 2017-04-12: 1 mg via INTRAVENOUS
  Filled 2017-04-12: qty 0.2

## 2017-04-12 MED ORDER — DEXTROSE-NACL 5-0.45 % IV SOLN
INTRAVENOUS | Status: DC
  Administered 2017-04-12: 09:00:00 via INTRAVENOUS
  Administered 2017-04-13: 50 mL/h via INTRAVENOUS

## 2017-04-12 MED ORDER — THIAMINE HCL 100 MG/ML IJ SOLN
100.0000 mg | Freq: Every day | INTRAMUSCULAR | Status: AC
  Administered 2017-04-12: 100 mg via INTRAVENOUS
  Filled 2017-04-12: qty 2

## 2017-04-12 MED ORDER — LEVOFLOXACIN IN D5W 500 MG/100ML IV SOLN: 500.0000 mg | INTRAVENOUS | Status: DC

## 2017-04-12 MED ORDER — SODIUM CHLORIDE 0.9% FLUSH
10.0000 mL | Freq: Two times a day (BID) | INTRAVENOUS | Status: DC
  Administered 2017-04-12: 10 mL
  Administered 2017-04-13: 40 mL
  Administered 2017-04-14: 30 mL
  Administered 2017-04-17 – 2017-05-03 (×19): 10 mL

## 2017-04-12 MED ORDER — TRACE MINERALS CR-CU-MN-SE-ZN 10-1000-500-60 MCG/ML IV SOLN
INTRAVENOUS | Status: AC
  Filled 2017-04-12: qty 364.8

## 2017-04-12 MED ORDER — FUROSEMIDE 10 MG/ML IJ SOLN
40.0000 mg | Freq: Once | INTRAMUSCULAR | Status: AC
  Administered 2017-04-12: 40 mg via INTRAVENOUS
  Filled 2017-04-12: qty 4

## 2017-04-12 MED ORDER — ALTEPLASE 2 MG IJ SOLR
2.0000 mg | Freq: Once | INTRAMUSCULAR | Status: AC
  Administered 2017-04-12: 2 mg
  Filled 2017-04-12: qty 2

## 2017-04-12 MED ORDER — IPRATROPIUM-ALBUTEROL 0.5-2.5 (3) MG/3ML IN SOLN
3.0000 mL | Freq: Four times a day (QID) | RESPIRATORY_TRACT | Status: DC
  Administered 2017-04-12 – 2017-04-18 (×24): 3 mL via RESPIRATORY_TRACT
  Filled 2017-04-12 (×23): qty 3

## 2017-04-12 MED ORDER — CHLORHEXIDINE GLUCONATE CLOTH 2 % EX PADS
6.0000 | MEDICATED_PAD | Freq: Every day | CUTANEOUS | Status: DC
  Administered 2017-04-12 – 2017-04-20 (×8): 6 via TOPICAL

## 2017-04-12 MED ORDER — POTASSIUM CHLORIDE 10 MEQ/50ML IV SOLN: 10.0000 meq | INTRAVENOUS | Status: DC

## 2017-04-12 MED ORDER — ORAL CARE MOUTH RINSE
15.0000 mL | OROMUCOSAL | Status: DC
  Administered 2017-04-12 – 2017-04-17 (×49): 15 mL via OROMUCOSAL

## 2017-04-12 MED ORDER — DEXTROSE 5 % IV SOLN
20.0000 mmol | Freq: Once | INTRAVENOUS | Status: DC
  Filled 2017-04-12: qty 6.67

## 2017-04-12 MED ORDER — PIPERACILLIN-TAZOBACTAM 3.375 G IVPB
3.3750 g | Freq: Three times a day (TID) | INTRAVENOUS | Status: DC
  Administered 2017-04-12 – 2017-04-13 (×5): 3.375 g via INTRAVENOUS
  Filled 2017-04-12 (×5): qty 50

## 2017-04-12 MED ORDER — CHLORHEXIDINE GLUCONATE 0.12% ORAL RINSE (MEDLINE KIT)
15.0000 mL | Freq: Two times a day (BID) | OROMUCOSAL | Status: DC
  Administered 2017-04-12 – 2017-04-17 (×10): 15 mL via OROMUCOSAL

## 2017-04-12 MED ORDER — SODIUM PHOSPHATES 45 MMOLE/15ML IV SOLN
10.0000 mmol | Freq: Once | INTRAVENOUS | Status: AC
  Administered 2017-04-12: 10 mmol via INTRAVENOUS
  Filled 2017-04-12: qty 3.33

## 2017-04-12 NOTE — Progress Notes (Signed)
Left IJ Central line cath tip not in proper position for TNA administration. Notified Dr. Chase Caller. Order received to hold TNA for now. Will address new central line placement for TNA in AM.

## 2017-04-12 NOTE — Progress Notes (Signed)
  Echocardiogram 2D Echocardiogram has been performed.  Dominic Phillips 04/12/2017, 11:54 AM

## 2017-04-12 NOTE — Progress Notes (Signed)
[facility] Progress Note Patient Name: Dominic Phillips DOB: 27-Jan-1961 MRN: [id number]   Date of Service  04/12/2017  HPI/Events of Note  Occlusion of distal port of CVL. No response to first dose of Cathflo.  eICU Interventions  Will order second dose of Cathflo.     Intervention Category Major Interventions: Other:  [place] Britten Cornelia Copa 04/12/2017, 5:05 AM

## 2017-04-12 NOTE — Progress Notes (Addendum)
Pharmacy Antibiotic Note  Dominic Phillips is a 56 y.o. male transferred from [facility] on 04/11/2017 with diverticular abcess s/p drainage, now with VDRF and ARF .  Fluid from diverticular abcess grew pan-sensitive E. Coli.  Zosyn and Levaquin started at [facility] has been consulted for Zosyn dosing.  Plan: Zosyn 3.375 g IV q8h Change Levaquin 500 mg IV q48h  Height: 5\' 11"  (180.3 cm) Weight: 150 lb 9.2 oz (68.3 kg) IBW/kg (Calculated) : 75.3  Temp (24hrs), Avg:98.4 F (36.9 C), Min:98 F (36.7 C), Max:98.7 F (37.1 C)   Recent Labs Lab 04/11/17 1850  WBC 14.2*  CREATININE 3.61*    Estimated Creatinine Clearance: 22.6 mL/min (A) (by C-G formula based on SCr of 3.61 mg/dL (H)).    No Known Allergies   Verlena Marlette, Bronson Curb 04/12/2017 1:10 AM

## 2017-04-12 NOTE — Progress Notes (Signed)
Subjective/Chief Complaint: Pt with no acute changes overnight.  Cont' to require pressors    Objective: Vital signs in last 24 hours: Temp:  [98 F (36.7 C)-98.7 F (37.1 C)] 98.5 F (36.9 C) (10/28 0745) Pulse Rate:  [66-77] 77 (10/28 0952) Resp:  [16-30] 17 (10/28 0952) BP: (80-124)/(61-89) 86/61 (10/28 0952) SpO2:  [93 %-100 %] 97 % (10/28 0952) Arterial Line BP: (82-137)/(45-69) 82/45 (10/28 0900) FiO2 (%):  [50 %-60 %] 50 % (10/28 0952) Weight:  [68.3 kg (150 lb 9.2 oz)-72 kg (158 lb 11.7 oz)] 72 kg (158 lb 11.7 oz) (10/28 0500) Last BM Date: 04/11/17  Intake/Output from previous day: 10/27 0701 - 10/28 0700 In: 1520.3 [I.V.:1390.3] Out: 390 [Urine:280; Emesis/NG output:50; Stool:60] Intake/Output this shift: Total I/O In: 250.4 [I.V.:250.4] Out: 20 [Urine:20]  Constitutional: No acute distress,  appears states age. Eyes: Anicteric sclerae, moist conjunctiva, no lid lag Lungs: Clear to auscultation bilaterally, normal respiratory effort CV: regular rate and rhythm, no murmurs, no peripheral edema, pedal pulses 2+ GI: soft, distended, some generalized ttp, hypoactive BS Skin: No rashes, palpation reveals normal turgor Psychiatric: intubated and sedated  Lab Results:   Recent Labs  04/11/17 1850 04/12/17 0600  WBC 14.2* 14.7*  HGB 7.9* 8.4*  HCT 24.0* 25.4*  PLT 261 257   BMET  Recent Labs  04/11/17 1850 04/12/17 0600  NA 133* 132*  K 3.6 3.7  CL 97* 95*  CO2 26 26  GLUCOSE 143* 139*  BUN 31* 35*  CREATININE 3.61* 3.93*  CALCIUM 7.6* 7.9*   ABG  Recent Labs  04/12/17 0227 04/12/17 0852  PHART 7.344* 7.337*  HCO3 26.3 25.3    Studies/Results: Dg Chest 1 View  Result Date: 04/11/2017 CLINICAL DATA:  Intubation. EXAM: CHEST 1 VIEW COMPARISON:  None. FINDINGS: Endotracheal tube is in place with tip approximately 6.0 cm above the carina. A nasogastric tube is in place with tip to level of the stomach. Left IJ central line tip overlies  the level of the brachiocephalic confluence with the superior vena cava. Heart size is probably normal. There are diffuse pulmonary opacities throughout the lungs bilaterally, more confluent at the right lung base. Moderate right pleural effusion. IMPRESSION: 1. Diffuse bilateral pulmonary infiltrates, right greater than left. 2. Considerations include asymmetric pulmonary edema and/or infectious process. Electronically Signed   By: Nolon Nations M.D.   On: 04/11/2017 15:19   Dg Chest Port 1 View  Result Date: 04/12/2017 CLINICAL DATA:  ARDS and progressive renal insufficiency. This gentleman has a long history of heavy ETOH abuse. Pt remains on ventilator. EXAM: PORTABLE CHEST 1 VIEW COMPARISON:  Chest x-ray dated 04/11/2017. FINDINGS: Endotracheal tube well positioned with tip approximately 4 cm above the carina. Left IJ central line appears stable in position with tip approximating the junction of the brachiocephalic vein and upper SVC. Enteric tube passes below the stomach. Hazy reticulonodular opacities are again seen bilaterally, slightly worsened on the left, perhaps slightly improved on the right. Veiled opacities at each lung base suggests small layering pleural effusions and/or atelectasis. No pneumothorax seen. IMPRESSION: 1. Hazy reticulonodular opacities bilaterally, left greater than right, compatible with the given history of ARDS, possibly some component of pulmonary edema. 2. Bibasilar opacities, suggesting small layering pleural effusions and/or atelectasis. 3. Support apparatus appears stable in position, as detailed above. Electronically Signed   By: Franki Cabot M.D.   On: 04/12/2017 07:35   Dg Abd Portable 1v  Result Date: 04/11/2017 CLINICAL DATA:  Encounter for  imaging study to confirm orogastric tube placement. EXAM: PORTABLE ABDOMEN - 1 VIEW COMPARISON:  None. FINDINGS: Nasogastric tube is in the left upper abdomen probably in the gastric fundus. There is contrast in the colon  but there are dilated loops of small bowel. Small bowel loops measure up to 4.3 cm. Patchy densities in the lower chest. IMPRESSION: Nasogastric tube is in the proximal stomach region. Dilated loops of small bowel in the abdomen. Findings are concerning for at least a partial small bowel obstruction. No comparison abdominal radiographs. Incompletely visualized lung disease. Electronically Signed   By: Markus Daft M.D.   On: 04/11/2017 15:32    Anti-infectives: Anti-infectives    Start     Dose/Rate Route Frequency Ordered Stop   04/13/17 2200  levofloxacin (LEVAQUIN) IVPB 500 mg  Status:  Discontinued     500 mg 100 mL/hr over 60 Minutes Intravenous Every 48 hours 04/12/17 0114 04/12/17 0846   04/12/17 0200  piperacillin-tazobactam (ZOSYN) IVPB 3.375 g     3.375 g 12.5 mL/hr over 240 Minutes Intravenous Every 8 hours 04/12/17 0103     04/12/17 0000  levofloxacin (LEVAQUIN) IVPB 750 mg  Status:  Discontinued     750 mg 100 mL/hr over 90 Minutes Intravenous Every 48 hours 04/11/17 2230 04/12/17 0114      Assessment/Plan: 56 y/o M with diverticulitis with abscess s/p IR drain Active Problems:   Acute respiratory distress syndrome (ARDS) (HCC) EtOH dependency Sepsis  -Pt will require con't of TNA.  Consulted pharmacy -Will review CT with RADS -IR drain placed for evac of abscess -Likely ileus -Con't abx Vent weaning per CCM  LOS: 1 day    Rosario Jacks., Anne Hahn 04/12/2017

## 2017-04-12 NOTE — Progress Notes (Signed)
PHARMACY - ADULT TOTAL PARENTERAL NUTRITION CONSULT NOTE   Pharmacy Consult for TPN Indication: prolonged ileus, possible SBO  Patient Measurements: Height: 5\' 11"  (180.3 cm) Weight: 150 lb 9.2 oz (68.3 kg) IBW/kg (Calculated) : 75.3   Body mass index is 21 kg/m. Usual Weight: 58.9 kg  Assessment:  56 year old male with PMH notable for heavy ETOH use and smoking admitted to [facility] on 04/02/17 with abdominal pain and fever found to have diverticular abscess (IR catheter) with course complicated by DTs, possible SBO, pneumonia progressing into respiratory failure requiring intubation 10/25 and pressors for hemodynamic instability and was subsequently transferred to [facility] on 04/11/17 for ARDS and progressive renal insufficiency.   GI: Pre-albumin <5. NGT output 50 mL (bile). Rectal tube output 60 mL.  Endo: CBGs 125-152 Insulin requirements in the past 24 hours:  Lytes: Na/Cl low (132/95), K 3.7. Phos low 2. Mg 2.2. CoCa ~9.9. Renal: SCr up 3.93, BUN 35, est CrCl ~ 20-25 mL/min. UOP low at 0.2 ml/kg/hr. I/O + 1.1L. Pulm: Vent, FiO2 50%, respiratory acidosis.  Cards: BP soft, Levophed at 1 mcg/min. NSR. Hepatobil: AST/ALT up 44/12, AlkPhos up 201. Tbili wnl. TG elevated 214. Neuro: Precedex at 1.7 mcg/kg/hr, prn versed. RASS -2, CPOT 0-2. GCS 8. ID: Zosyn and Levaquin for intra-abdominal infection/HCAP. WBC 14.7, afebrile. MRSA pcr negative.  Best Practices: PPI IV, Lovenox 30 mg TPN Access: Triple Lumen CVC TPN start date: 04/09/17 (at [facility], received 2 days prior to transfer; last received lipids on 10/26)  Nutritional Goals (per RD recommendation on pending): KCal: 1700-2000 Protein: 88-102g *Using total body weight - 68 kg   Current Nutrition:  D10 at 40 mL/hr until TPN starts.   Plan:  Restart Customized TPN (AA15%/CHO/Lip: 57/18/26) at 40 ml/hr Hold 20% lipid emulsion today - monitor TG and clinical status. Stop D10 at 1800 PM when TPN is hung. Today's  TPN has increased Na with Chloride:Acetate ratio set to 2:1. Potassium was reduced by 1/2 from standard. Magnesium was reduced by 1/2 from standard. Phosphorus was reduced by 1/3 from standard.   This provides 55 g of protein and 1056 kCals per day meeting 62% of needs.  Give NaPhos 15mmol IV x1 today. Add MVI and trace elements in TPN Add Thiamine 100mg  and Folic Acid 1mg  to TPN Initiate sensitive SSI q6h and adjust as needed Monitor TPN labs F/U lytes and CBG control   Sloan Leiter, PharmD, BCPS, BCCCP Clinical Pharmacist Clinical phone 04/12/2017 until 3:30PM- [phone] After hours, please call #[phone] 04/12/2017,7:07 AM

## 2017-04-12 NOTE — Progress Notes (Signed)
PULMONARY / CRITICAL CARE MEDICINE   Name: Dominic Phillips MRN: [id number] DOB: 11/26/1960    ADMISSION DATE:  04/11/2017 CONSULTATION DATE:  04/11/2017  REFERRING MD:  Emeline Gins Elgergawy  CHIEF COMPLAINT:  ARDS secondary to aspiration pneumonia complicating a diverticular abscess  BRIEF  This is a 56 y.o. W M transferred from [facility] for management of ARDS and progressive renal insufficiency. This gentleman has a long history of heavy ETOH abuse (~ 18 beers per day) and presented to [facility] on 04/02/2017 with a c/o of abdominal pain without fevers, leukocytosis or diarrhea. An abd CT revealed pericolonic inflammation/fluid collection, for which he was admitted for diverticular abscess. Was consulted by Gen Surg who suggested placement of a catheter by interventional radiology; subsequent cultures grew E. Coli, for which Zosyn was initiated. Hospital course was complicated by the development of DTs, treated with 1 beer tid and Ativan prn. He slowly improved but worsened on 10/23 with increased WBC and progressive hypoxemia. Repeat abdominal CT showed improving abscess but developing SBO and multilobar pneumonia. Hypoxemia could not be corrected with BiPAP, along with worsening renal function such that he was intubated on 10/25 and required pressors for hemodynamic support. CT chest/abdomen/pelvis on 10/26 showed bilateral pneumonia with volume overload; no change in the RLQ percutaneous drain catheter. With rising creat to 3.1 and 290 cc U/O over 24 hours, transfer was requested.  :   EVENTS 10/18 - admit [place] -  04/11/2017 - [facility] (in [place] had Left IJ CVL and RLQ drain and aline)   SUBJECTIVE/OVERNIGHT/INTERVAL HX 04/12/2017 - > on vent 50%, Sedation: precedex 1.5 gttt + prn fent/versed, Off levophed byt might nee d to go back on it. Afebrile. No family at bedside. Making some urine but creat worse to 3.9   VITAL SIGNS: BP 115/79   Pulse 72   Temp 98.5 F (36.9 C)  (Oral)   Resp (!) 23   Ht 5\' 11"  (1.803 m)   Wt 72 kg (158 lb 11.7 oz)   SpO2 94%   BMI 22.14 kg/m   HEMODYNAMICS: Off Levophed at 2 mcg    VENTILATOR SETTINGS: Vent Mode: PRVC FiO2 (%):  [50 %-60 %] 50 % Set Rate:  [16 bmp] 16 bmp Vt Set:  [600 mL] 600 mL PEEP:  [5 cmH20] 5 cmH20 Plateau Pressure:  [22 cmH20-28 cmH20] 27 cmH20  INTAKE / OUTPUT: I/O last 3 completed shifts: In: 1520.3 [I.V.:1390.3; Other:130] Out: 390 [Urine:280; Emesis/NG output:50; Stool:60]  PHYSICAL EXAMINATION:  General Appearance:    Looks criticall ill  Head:    Normocephalic, without obvious abnormality, atraumatic  Eyes:    PERRL - yes, conjunctiva/corneas - clear      Ears:    Normal external ear canals, both ears  Nose:   NG tube - yes  Throat:  ETT TUBE - yes , OG tube - no  Neck:   Supple,  No enlargement/tenderness/nodules     Lungs:     Bialteral Crackles. Barreel chest, Ventilator   Synchrony - yes  Chest wall:    No deformity  Heart:    S1 and S2 normal, no murmur, CVP - no.  Pressors - just came off . Now on precedex  Abdomen:     Soft, no masses, no organomegaly  Genitalia:    Not done  Rectal:   not done  Extremities:   Extremities- cachectic     Skin:   Intact in exposed areas . Sacral area - none reported per rn  Neurologic:   Sedation - precedex -> RASS - -3     PULMONARY  Recent Labs Lab 04/11/17 1703 04/12/17 0227  PHART 7.304* 7.344*  PCO2ART 55.0* 48.4*  PO2ART 103.0 108.0  HCO3 27.3 26.3  TCO2 29 28  O2SAT 97.0 98.0    CBC  Recent Labs Lab 04/11/17 1850 04/12/17 0600  HGB 7.9* 8.4*  HCT 24.0* 25.4*  WBC 14.2* 14.7*  PLT 261 257    COAGULATION No results for input(s): INR in the last 168 hours.  CARDIAC  No results for input(s): TROPONINI in the last 168 hours. No results for input(s): PROBNP in the last 168 hours.   CHEMISTRY  Recent Labs Lab 04/11/17 1850 04/12/17 0600  NA 133* 132*  K 3.6 3.7  CL 97* 95*  CO2 26 26  GLUCOSE  143* 139*  BUN 31* 35*  CREATININE 3.61* 3.93*  CALCIUM 7.6* 7.9*  MG  --  2.2  PHOS  --  2.0*   Estimated Creatinine Clearance: 21.9 mL/min (A) (by C-G formula based on SCr of 3.93 mg/dL (H)).   LIVER  Recent Labs Lab 04/12/17 0600  AST 44*  ALT 12*  ALKPHOS 201*  BILITOT 0.7  PROT 5.8*  ALBUMIN 1.5*     INFECTIOUS No results for input(s): LATICACIDVEN, PROCALCITON in the last 168 hours.   ENDOCRINE CBG (last 3)   Recent Labs  04/12/17 0038 04/12/17 0353 04/12/17 0743  GLUCAP 125* 130* 152*         IMAGING x48h  - image(s) personally visualized  -   highlighted in bold Dg Chest 1 View  Result Date: 04/11/2017 CLINICAL DATA:  Intubation. EXAM: CHEST 1 VIEW COMPARISON:  None. FINDINGS: Endotracheal tube is in place with tip approximately 6.0 cm above the carina. A nasogastric tube is in place with tip to level of the stomach. Left IJ central line tip overlies the level of the brachiocephalic confluence with the superior vena cava. Heart size is probably normal. There are diffuse pulmonary opacities throughout the lungs bilaterally, more confluent at the right lung base. Moderate right pleural effusion. IMPRESSION: 1. Diffuse bilateral pulmonary infiltrates, right greater than left. 2. Considerations include asymmetric pulmonary edema and/or infectious process. Electronically Signed   By: Nolon Nations M.D.   On: 04/11/2017 15:19   Dg Chest Port 1 View  Result Date: 04/12/2017 CLINICAL DATA:  ARDS and progressive renal insufficiency. This gentleman has a long history of heavy ETOH abuse. Pt remains on ventilator. EXAM: PORTABLE CHEST 1 VIEW COMPARISON:  Chest x-ray dated 04/11/2017. FINDINGS: Endotracheal tube well positioned with tip approximately 4 cm above the carina. Left IJ central line appears stable in position with tip approximating the junction of the brachiocephalic vein and upper SVC. Enteric tube passes below the stomach. Hazy reticulonodular  opacities are again seen bilaterally, slightly worsened on the left, perhaps slightly improved on the right. Veiled opacities at each lung base suggests small layering pleural effusions and/or atelectasis. No pneumothorax seen. IMPRESSION: 1. Hazy reticulonodular opacities bilaterally, left greater than right, compatible with the given history of ARDS, possibly some component of pulmonary edema. 2. Bibasilar opacities, suggesting small layering pleural effusions and/or atelectasis. 3. Support apparatus appears stable in position, as detailed above. Electronically Signed   By: Franki Cabot M.D.   On: 04/12/2017 07:35   Dg Abd Portable 1v  Result Date: 04/11/2017 CLINICAL DATA:  Encounter for imaging study to confirm orogastric tube placement. EXAM: PORTABLE ABDOMEN - 1 VIEW COMPARISON:  None.  FINDINGS: Nasogastric tube is in the left upper abdomen probably in the gastric fundus. There is contrast in the colon but there are dilated loops of small bowel. Small bowel loops measure up to 4.3 cm. Patchy densities in the lower chest. IMPRESSION: Nasogastric tube is in the proximal stomach region. Dilated loops of small bowel in the abdomen. Findings are concerning for at least a partial small bowel obstruction. No comparison abdominal radiographs. Incompletely visualized lung disease. Electronically Signed   By: Markus Daft M.D.   On: 04/11/2017 15:32   ;  STUDIES:  PCXR reviewed  CULTURES: None  ANTIBIOTICS: Will continue Zosyn + Levo  SIGNIFICANT EVENTS: None  LINES/TUBES: ETT; L IJ, Foley  DISCUSSION: PCXR appears more compatible with interstitial edema as I do not see definite acinar shadows. Will adjust vent settings to minimize FIO2 but increase VE to correct hypercapnea and respiratory acidosis. 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  ASSESSMENT / PLAN:  PULMONARY A: Seems to have copd at baseline ARDS on cXR but ddx on volume  overload   P:   PRVC  CARDIOVASCULAR A:  Circulatory shock  - fluctuating between on and off levophed need  P:  Lasix x 1 dose Get echo Check trop  RENAL A:   AKI and now in ATN range  - making urine , k ok, bic ok on 04/12/2017  Mild low phos   P:   Hold off nephrol consult Give lasix and reasses Replete phos  GASTROINTESTINAL A:   Diverticular abscess with +/- SBO- s/p RLQ drain at [place] between 04/02/17 and 04/11/17 P:    Consult with [place] gen surgery for continued f/u  - paged 04/12/2017  PPI IV Ask [facility] about TF ; hold on    HEMATOLOGIC  Recent Labs Lab 04/11/17 1850 04/12/17 0600  HGB 7.9* 8.4*  HCT 24.0* 25.4*  WBC 14.2* 14.7*  PLT 261 257    A:   Anemia of critical illness P:  - PRBC for hgb </= 6.9gm%    - exceptions are   -  if ACS susepcted/confirmed then transfuse for hgb </= 8.0gm%,  or    -  active bleeding with hemodynamic instability, then transfuse regardless of hemoglobin value   At at all times try to transfuse 1 unit prbc as possible with exception of active hemorrhage    INFECTIOUS A:   Diverticular abscess at [facility] - s/p drain P:   Continue Zosyn  DC Levo  ENDOCRINE A:   No known prior hx.   P:   ICU hyperglucemia protoocl  NEUROLOGIC A:   Head CT 10/26: No intracranial mass, hemorrhage or extra-axial fluid collection DT at  P:   RASS goal: -2 Precedex gtt to contije    FAMILY  - Updates: Spoke with family regarding plans at admit 04/11/2017 . None at bedside 04/12/2017   The patient is critically ill with multiple organ systems failure and requires high complexity decision making for assessment and support, frequent evaluation and titration of therapies, application of advanced monitoring technologies and extensive interpretation of multiple databases.   Critical Care Time devoted to patient care services described in this note is  30  Minutes. This time reflects time of care of this signee Dr  Brand Males. This critical care time does not reflect procedure time, or teaching time or supervisory time of PA/NP/Med student/Med Resident etc but could involve care discussion time    Dr.  Brand Males, M.D., F.C.C.P Pulmonary and Critical Care Medicine Staff Physician [facility] Pager: [phone], If no answer or between  15:00h - 7:00h: call [phone]  04/12/2017 8:27 AM

## 2017-04-13 ENCOUNTER — Inpatient Hospital Stay (HOSPITAL_COMMUNITY): Payer: Medicaid Other

## 2017-04-13 LAB — HEMOGLOBIN AND HEMATOCRIT, BLOOD
HEMATOCRIT: 23.7 % — AB (ref 39.0–52.0)
Hemoglobin: 7.9 g/dL — ABNORMAL LOW (ref 13.0–17.0)

## 2017-04-13 LAB — PROTEIN / CREATININE RATIO, URINE
Creatinine, Urine: 56.9 mg/dL
PROTEIN CREATININE RATIO: 0.79 mg/mg{creat} — AB (ref 0.00–0.15)
TOTAL PROTEIN, URINE: 45 mg/dL

## 2017-04-13 LAB — IRON AND TIBC
Iron: 16 ug/dL — ABNORMAL LOW (ref 45–182)
Saturation Ratios: 13 % — ABNORMAL LOW (ref 17.9–39.5)
TIBC: 123 ug/dL — ABNORMAL LOW (ref 250–450)
UIBC: 107 ug/dL

## 2017-04-13 LAB — PHOSPHORUS: PHOSPHORUS: 3.1 mg/dL (ref 2.5–4.6)

## 2017-04-13 LAB — POCT I-STAT 3, ART BLOOD GAS (G3+)
Acid-base deficit: 2 mmol/L (ref 0.0–2.0)
Bicarbonate: 24.9 mmol/L (ref 20.0–28.0)
O2 Saturation: 99 %
PCO2 ART: 48.1 mmHg — AB (ref 32.0–48.0)
PH ART: 7.321 — AB (ref 7.350–7.450)
PO2 ART: 179 mmHg — AB (ref 83.0–108.0)
Patient temperature: 98.2
TCO2: 26 mmol/L (ref 22–32)

## 2017-04-13 LAB — CBC WITH DIFFERENTIAL/PLATELET
BASOS PCT: 0 %
Basophils Absolute: 0 10*3/uL (ref 0.0–0.1)
EOS ABS: 0.1 10*3/uL (ref 0.0–0.7)
Eosinophils Relative: 1 %
HCT: 23 % — ABNORMAL LOW (ref 39.0–52.0)
HEMOGLOBIN: 7.7 g/dL — AB (ref 13.0–17.0)
LYMPHS PCT: 7 %
Lymphs Abs: 0.8 10*3/uL (ref 0.7–4.0)
MCH: 31.6 pg (ref 26.0–34.0)
MCHC: 33.5 g/dL (ref 30.0–36.0)
MCV: 94.3 fL (ref 78.0–100.0)
MONOS PCT: 7 %
Monocytes Absolute: 0.8 10*3/uL (ref 0.1–1.0)
NEUTROS ABS: 9.9 10*3/uL — AB (ref 1.7–7.7)
Neutrophils Relative %: 85 %
Platelets: 296 10*3/uL (ref 150–400)
RBC: 2.44 MIL/uL — ABNORMAL LOW (ref 4.22–5.81)
RDW: 17.5 % — ABNORMAL HIGH (ref 11.5–15.5)
WBC: 11.6 10*3/uL — ABNORMAL HIGH (ref 4.0–10.5)

## 2017-04-13 LAB — GLUCOSE, CAPILLARY
GLUCOSE-CAPILLARY: 105 mg/dL — AB (ref 65–99)
GLUCOSE-CAPILLARY: 106 mg/dL — AB (ref 65–99)
GLUCOSE-CAPILLARY: 109 mg/dL — AB (ref 65–99)
GLUCOSE-CAPILLARY: 114 mg/dL — AB (ref 65–99)
GLUCOSE-CAPILLARY: 116 mg/dL — AB (ref 65–99)
GLUCOSE-CAPILLARY: 134 mg/dL — AB (ref 65–99)
Glucose-Capillary: 112 mg/dL — ABNORMAL HIGH (ref 65–99)
Glucose-Capillary: 132 mg/dL — ABNORMAL HIGH (ref 65–99)

## 2017-04-13 LAB — BASIC METABOLIC PANEL
ANION GAP: 12 (ref 5–15)
BUN: 39 mg/dL — ABNORMAL HIGH (ref 6–20)
CALCIUM: 7.8 mg/dL — AB (ref 8.9–10.3)
CO2: 25 mmol/L (ref 22–32)
CREATININE: 4.34 mg/dL — AB (ref 0.61–1.24)
Chloride: 96 mmol/L — ABNORMAL LOW (ref 101–111)
GFR, EST AFRICAN AMERICAN: 16 mL/min — AB (ref >60)
GFR, EST NON AFRICAN AMERICAN: 14 mL/min — AB (ref >60)
GLUCOSE: 119 mg/dL — AB (ref 65–99)
Potassium: 3.6 mmol/L (ref 3.5–5.1)
Sodium: 133 mmol/L — ABNORMAL LOW (ref 135–145)

## 2017-04-13 LAB — COMPREHENSIVE METABOLIC PANEL
ALK PHOS: 188 U/L — AB (ref 38–126)
ALT: 16 U/L — AB (ref 17–63)
AST: 44 U/L — ABNORMAL HIGH (ref 15–41)
Albumin: 1.3 g/dL — ABNORMAL LOW (ref 3.5–5.0)
Anion gap: 10 (ref 5–15)
BILIRUBIN TOTAL: 0.7 mg/dL (ref 0.3–1.2)
BUN: 41 mg/dL — ABNORMAL HIGH (ref 6–20)
CALCIUM: 7.8 mg/dL — AB (ref 8.9–10.3)
CO2: 25 mmol/L (ref 22–32)
CREATININE: 4.56 mg/dL — AB (ref 0.61–1.24)
Chloride: 98 mmol/L — ABNORMAL LOW (ref 101–111)
GFR calc non Af Amer: 13 mL/min — ABNORMAL LOW (ref >60)
GFR, EST AFRICAN AMERICAN: 15 mL/min — AB (ref >60)
Glucose, Bld: 113 mg/dL — ABNORMAL HIGH (ref 65–99)
Potassium: 3.6 mmol/L (ref 3.5–5.1)
SODIUM: 133 mmol/L — AB (ref 135–145)
Total Protein: 5.7 g/dL — ABNORMAL LOW (ref 6.5–8.1)

## 2017-04-13 LAB — PROTIME-INR
INR: 1.24
PROTHROMBIN TIME: 15.5 s — AB (ref 11.4–15.2)

## 2017-04-13 LAB — PREALBUMIN: Prealbumin: <5 mg/dL — ABNORMAL LOW (ref 18–38)

## 2017-04-13 LAB — TROPONIN I: Troponin I: <0.03 ng/mL (ref <0.03)

## 2017-04-13 LAB — FIBRINOGEN: Fibrinogen: >800 mg/dL — ABNORMAL HIGH (ref 210–475)

## 2017-04-13 LAB — MAGNESIUM: Magnesium: 1.9 mg/dL (ref 1.7–2.4)

## 2017-04-13 LAB — SODIUM, URINE, RANDOM: Sodium, Ur: 60 mmol/L

## 2017-04-13 LAB — PLATELET COUNT: Platelets: 298 10*3/uL (ref 150–400)

## 2017-04-13 LAB — LACTIC ACID, PLASMA: Lactic Acid, Venous: 0.9 mmol/L (ref 0.5–1.9)

## 2017-04-13 LAB — TRIGLYCERIDES: Triglycerides: 203 mg/dL — ABNORMAL HIGH (ref <150)

## 2017-04-13 LAB — APTT: APTT: 35 s (ref 24–36)

## 2017-04-13 MED ORDER — HEPARIN SODIUM (PORCINE) 5000 UNIT/ML IJ SOLN
5000.0000 [IU] | Freq: Three times a day (TID) | INTRAMUSCULAR | Status: DC
  Administered 2017-04-13 – 2017-05-05 (×63): 5000 [IU] via SUBCUTANEOUS
  Filled 2017-04-13 (×67): qty 1

## 2017-04-13 MED ORDER — FUROSEMIDE 10 MG/ML IJ SOLN
80.0000 mg | Freq: Once | INTRAMUSCULAR | Status: AC
  Administered 2017-04-13: 80 mg via INTRAVENOUS
  Filled 2017-04-13: qty 8

## 2017-04-13 MED ORDER — DEXTROSE-NACL 5-0.45 % IV SOLN: INTRAVENOUS | Status: DC

## 2017-04-13 MED ORDER — FOLIC ACID 5 MG/ML IJ SOLN
1.0000 mg | Freq: Once | INTRAMUSCULAR | Status: AC
  Administered 2017-04-13: 1 mg via INTRAVENOUS
  Filled 2017-04-13: qty 0.2

## 2017-04-13 MED ORDER — PIPERACILLIN-TAZOBACTAM IN DEX 2-0.25 GM/50ML IV SOLN
2.2500 g | Freq: Three times a day (TID) | INTRAVENOUS | Status: DC
  Administered 2017-04-13 – 2017-04-15 (×5): 2.25 g via INTRAVENOUS
  Filled 2017-04-13 (×6): qty 50

## 2017-04-13 MED ORDER — THIAMINE HCL 100 MG/ML IJ SOLN
100.0000 mg | Freq: Once | INTRAMUSCULAR | Status: AC
  Administered 2017-04-13: 100 mg via INTRAVENOUS
  Filled 2017-04-13: qty 2

## 2017-04-13 MED ORDER — TRACE MINERALS CR-CU-MN-SE-ZN 10-1000-500-60 MCG/ML IV SOLN
INTRAVENOUS | Status: AC
  Administered 2017-04-13: 18:00:00 via INTRAVENOUS
  Filled 2017-04-13: qty 345.6

## 2017-04-13 NOTE — Progress Notes (Addendum)
Pharmacy Antibiotic Note  Dominic Phillips is a 56 y.o. male transferred from [facility] on 04/11/2017 with diverticular abcess s/p drainage, now with VDRF and ARF.  Fluid from diverticular abcess grew pan-sensitive E. Coli.  Zosyn and Levaquin started at [place] has been discontinued. Pharmacy consulted to dose zosyn.   Renal function is worsening - Scr 4.56, CrCl ~19 ml/min. WBC remains elevated at 11.6, pt is afebrile.   Plan: Decrease Zosyn 2.25 g IV q8h  Height: 5\' 11"  (180.3 cm) Weight: 162 lb 0.6 oz (73.5 kg) IBW/kg (Calculated) : 75.3  Temp (24hrs), Avg:98.2 F (36.8 C), Min:97.8 F (36.6 C), Max:98.5 F (36.9 C)   Recent Labs Lab 04/11/17 1850 04/12/17 0600 04/12/17 1843 04/13/17 0050 04/13/17 0520  WBC 14.2* 14.7*  --   --  11.6*  CREATININE 3.61* 3.93* 4.19* 4.34* 4.56*  LATICACIDVEN  --   --   --  0.9  --     Estimated Creatinine Clearance: 19.3 mL/min (A) (by C-G formula based on SCr of 4.56 mg/dL (H)).    No Known Allergies   Leroy Libman, PharmD Pharmacy Resident Pager: [phone]

## 2017-04-13 NOTE — Consult Note (Signed)
Reason for Consult: worsening of renal function  Referring Physician: Dr. Jamelle Rushing Dominic Phillips is an 56 y.o. male PMH ETOH abuse, ? COPD  HPI: Presented to [facility]/18 with 3 weeks abdominal pain and diarrhea, was found to have leukocytosis and a diverticular abscess. A catheter was placed by IR, drainage grew E.coli and he was started on Zosyn. On 10/23 he developed worsening of leukocytosis and progressive hypoxemia and CT revealed SBO and multilobar pneumonia. He was intubated 10/25 and was started on pressors. [facility] when he had a rising crt to 3.1 and 290 ml urine output over 24 hours for consideration of dialysis.  On arrival he had Crt 3.61 which has continued to worsen to 4.56 today with increasing BUN. His urine output has been stable at out 350 ml per day since admission. Phosphorous, potassium, and anion gap have remained stable. This is his first time in the [facility] so we do not have data available regarding his baseline renal function. His sister is at bedside and says that she did not get the sense that he was following with a general practitioner regularly.   Trend in Creatinine: Creatinine, Ser  Date/Time Value Ref Range Status  04/13/2017 05:20 AM 4.56 (H) 0.61 - 1.24 mg/dL Final  04/13/2017 12:50 AM 4.34 (H) 0.61 - 1.24 mg/dL Final  04/12/2017 06:43 PM 4.19 (H) 0.61 - 1.24 mg/dL Final  04/12/2017 06:00 AM 3.93 (H) 0.61 - 1.24 mg/dL Final  04/11/2017 06:50 PM 3.61 (H) 0.61 - 1.24 mg/dL Final    PMH:  History reviewed. No pertinent past medical history.  PSH:  History reviewed. No pertinent surgical history.  Allergies: No Known Allergies  Medications:   Prior to Admission medications   Not on File    Inpatient medications: . chlorhexidine gluconate (MEDLINE KIT)  15 mL Mouth Rinse BID  . Chlorhexidine Gluconate Cloth  6 each Topical Daily  . heparin subcutaneous  5,000 Units Subcutaneous Q8H  . ipratropium-albuterol   3 mL Nebulization Q6H  . mouth rinse  15 mL Mouth Rinse 10 times per day  . pantoprazole (PROTONIX) IV  40 mg Intravenous QHS  . sodium chloride flush  10-40 mL Intracatheter Q12H    Discontinued Meds:   Medications Discontinued During This Encounter  Medication Reason  . dexmedetomidine (PRECEDEX) 200 mcg in sodium chloride 0.9 % 50 mL (4 mcg/mL) infusion   . aspirin chewable tablet 324 mg   . aspirin suppository 300 mg   . levofloxacin (LEVAQUIN) IVPB 750 mg   . potassium chloride 10 mEq in 50 mL *CENTRAL LINE* IVPB   . sodium phosphate 20 mmol in dextrose 5 % 250 mL infusion   . dextrose 10 % infusion   . levofloxacin (LEVAQUIN) IVPB 500 mg   . enoxaparin (LOVENOX) injection 30 mg   . dextrose 5 %-0.45 % sodium chloride infusion   . dextrose 5 %-0.45 % sodium chloride infusion   . piperacillin-tazobactam (ZOSYN) IVPB 3.375 g     Social History:  reports that he has been smoking Cigarettes.  He has been smoking about 3.00 packs per day. He has never used smokeless tobacco. He reports that he drinks about 10.8 oz of alcohol per week . He reports that he does not use drugs.  Family History:  History reviewed. No pertinent family history.  Pertinent items are noted in HPI. Weight change: 11 lb 7.4 oz (5.2 kg)  Intake/Output Summary (Last 24 hours)  at 04/13/17 1242 Last data filed at 04/13/17 1200  Gross per 24 hour  Intake          1503.53 ml  Output              325 ml  Net          1178.53 ml   BP 110/67 (BP Location: Right Arm)   Pulse 89   Temp 98.5 F (36.9 C) (Axillary)   Resp 20   Ht '5\' 11"'$  (1.803 m)   Wt 162 lb 0.6 oz (73.5 kg)   SpO2 100%   BMI 22.60 kg/m  Vitals:   04/13/17 1100 04/13/17 1142 04/13/17 1146 04/13/17 1200  BP: (!) 85/63  (!) 86/63 110/67  Pulse: 85  76 89  Resp: (!) '25  20 20  '$ Temp:  98.5 F (36.9 C)    TempSrc:  Axillary    SpO2: 100%  100% 100%  Weight:      Height:         General: appears older than stated age  HEENT: ETT, NG  tube, L IJ cath  Cardiac: RRR, no murmur appreciated, + JVD  Lungs: intubated but sound clear to auscultation  Abdomen: distended, soft, non tender, foley catheter in place, RLQ drain with brown solid drainage  Ext: 1+ b/l upper ext pitting edema, no lower ext edema   Labs: Basic Metabolic Panel:  Recent Labs Lab 04/11/17 1850 04/12/17 0600 04/12/17 1843 04/13/17 0050 04/13/17 0520  NA 133* 132* 133* 133* 133*  K 3.6 3.7 3.5 3.6 3.6  CL 97* 95* 97* 96* 98*  CO2 '26 26 25 25 25  '$ GLUCOSE 143* 139* 126* 119* 113*  BUN 31* 35* 37* 39* 41*  CREATININE 3.61* 3.93* 4.19* 4.34* 4.56*  ALBUMIN  --  1.5*  --   --  1.3*  CALCIUM 7.6* 7.9* 7.8* 7.8* 7.8*  PHOS  --  2.0*  --   --  3.1   Liver Function Tests:  Recent Labs Lab 04/12/17 0600 04/13/17 0520  AST 44* 44*  ALT 12* 16*  ALKPHOS 201* 188*  BILITOT 0.7 0.7  PROT 5.8* 5.7*  ALBUMIN 1.5* 1.3*   No results for input(s): LIPASE, AMYLASE in the last 168 hours. No results for input(s): AMMONIA in the last 168 hours. CBC:  Recent Labs Lab 04/11/17 1850 04/12/17 0600 04/13/17 0520 04/13/17 0952  WBC 14.2* 14.7* 11.6*  --   NEUTROABS 12.6* 13.1* 9.9*  --   HGB 7.9* 8.4* 7.7* 7.9*  HCT 24.0* 25.4* 23.0* 23.7*  MCV 96.4 94.4 94.3  --   PLT 261 257 296 298   PT/INR: '@LABRCNTIP'$ (inr:5) Cardiac Enzymes: ) Recent Labs Lab 04/13/17 0520  TROPONINI <0.03   CBG:  Recent Labs Lab 04/12/17 2043 04/13/17 0026 04/13/17 0324 04/13/17 0748 04/13/17 1221  GLUCAP 116* 112* 109* 105* 106*    Iron Studies: No results for input(s): IRON, TIBC, TRANSFERRIN, FERRITIN in the last 168 hours.  Xrays/Other Studies: Dg Chest 1 View  Result Date: 04/11/2017 CLINICAL DATA:  Intubation. EXAM: CHEST 1 VIEW COMPARISON:  None. FINDINGS: Endotracheal tube is in place with tip approximately 6.0 cm above the carina. A nasogastric tube is in place with tip to level of the stomach. Left IJ central line tip overlies the level of the  brachiocephalic confluence with the superior vena cava. Heart size is probably normal. There are diffuse pulmonary opacities throughout the lungs bilaterally, more confluent at the right lung base. Moderate right pleural  effusion. IMPRESSION: 1. Diffuse bilateral pulmonary infiltrates, right greater than left. 2. Considerations include asymmetric pulmonary edema and/or infectious process. Electronically Signed   By: Nolon Nations M.D.   On: 04/11/2017 15:19   Dg Chest Port 1 View  Result Date: 04/13/2017 CLINICAL DATA:  Cough, shortness of breath. EXAM: PORTABLE CHEST 1 VIEW COMPARISON:  Radiograph of April 12, 2017. FINDINGS: The heart size and mediastinal contours are within normal limits. Endotracheal and nasogastric tubes are unchanged in position. Stable position of left internal jugular catheter is noted with distal tip in expected position of left brachiocephalic vein. No pneumothorax is noted. Diffuse reticular interstitial densities are noted throughout both lungs most consistent with edema or inflammation. Mild right pleural effusion is noted. The visualized skeletal structures are unremarkable. IMPRESSION: Stable support apparatus. Stable bilateral diffuse lung densities are noted concerning for edema or inflammation. Mild right pleural effusion. Electronically Signed   By: Marijo Conception, M.D.   On: 04/13/2017 08:52   Dg Chest Port 1 View  Result Date: 04/12/2017 CLINICAL DATA:  ARDS and progressive renal insufficiency. This gentleman has a long history of heavy ETOH abuse. Pt remains on ventilator. EXAM: PORTABLE CHEST 1 VIEW COMPARISON:  Chest x-ray dated 04/11/2017. FINDINGS: Endotracheal tube well positioned with tip approximately 4 cm above the carina. Left IJ central line appears stable in position with tip approximating the junction of the brachiocephalic vein and upper SVC. Enteric tube passes below the stomach. Hazy reticulonodular opacities are again seen bilaterally, slightly  worsened on the left, perhaps slightly improved on the right. Veiled opacities at each lung base suggests small layering pleural effusions and/or atelectasis. No pneumothorax seen. IMPRESSION: 1. Hazy reticulonodular opacities bilaterally, left greater than right, compatible with the given history of ARDS, possibly some component of pulmonary edema. 2. Bibasilar opacities, suggesting small layering pleural effusions and/or atelectasis. 3. Support apparatus appears stable in position, as detailed above. Electronically Signed   By: Franki Cabot M.D.   On: 04/12/2017 07:35   Dg Abd Portable 1v  Result Date: 04/11/2017 CLINICAL DATA:  Encounter for imaging study to confirm orogastric tube placement. EXAM: PORTABLE ABDOMEN - 1 VIEW COMPARISON:  None. FINDINGS: Nasogastric tube is in the left upper abdomen probably in the gastric fundus. There is contrast in the colon but there are dilated loops of small bowel. Small bowel loops measure up to 4.3 cm. Patchy densities in the lower chest. IMPRESSION: Nasogastric tube is in the proximal stomach region. Dilated loops of small bowel in the abdomen. Findings are concerning for at least a partial small bowel obstruction. No comparison abdominal radiographs. Incompletely visualized lung disease. Electronically Signed   By: Markus Daft M.D.   On: 04/11/2017 15:32   Assessment/Plan: 1. AKI (unknown b/l) with oliguria possibly secondary to hypoperfusion - Pt presents with worsening crt and BUN and volume overload on exam and CXR with mild right pleural effusion and diffuse opacities concerning for edema v inflammation. Despite this questionable pulmonary edema on CXR he has an FiO2 of 40%. Trial of IV lasix 40 mg yesterday without adequate response in urine output, will try a lasix challenge of IV Lasix 80 mg today and monitor for response.  1. Follow up renal ultrasound and urine protein/crt ratio  2. Anemia - ordered iron studies  3. Mineral metabolic disorder - calcium  wnl when corrected for low albumin, phos WNL  4. VDRF - attempted SBT today, PCCM is managing 5. Cardiac shock - stable, not requiring pressors  6. Diverticular abscess with ?SBO s/p drain placed by IR, on zosyn  7. ETOH dependence - on precedex gtt    Ledell Noss 04/13/2017, 12:42 PM

## 2017-04-13 NOTE — Clinical Social Work Note (Signed)
Clinical Social Work Assessment  Patient Details  Name: Dominic Phillips MRN: [id number] Date of Birth: 1960/09/03  Date of referral:  04/13/17               Reason for consult:  Family Concerns                Permission sought to share information with:  Case Manager Permission granted to share information::  Yes, Verbal Permission Granted  Name::     Lynnea Ferrier   Agency::     Relationship::     Contact Information:     Housing/Transportation Living arrangements for the past 2 months:  Single Family Home (with son ) Source of Information:  Other (Comment Required) (pt's sister ) Patient Interpreter Needed:  None Criminal Activity/Legal Involvement Pertinent to Current Situation/Hospitalization:  No - Comment as needed Significant Relationships:  Adult Children, Other Family Members Lives with:  Self Do you feel safe going back to the place where you live?  Yes Need for family participation in patient care:  Yes (Comment)  Care giving concerns:  CSW spoke with pt's sister and brother-in-law at bedside. During this time there are a number of concerns for this family. Pt has a disable son who needs care around the clock and has a caregiver who provides that, however caregiver is unable to make medical decisions for pt's son. Pt's sister is also concerned about who will make medical decisions for pt being that pt has not expressed who pt would like to be responsible for this.    Social Worker assessment / plan:  CSW spoke with pt's sister and brother-in-law at bedside. During this time CSW was informed that pt is from home where pt cares for pt's son who is disabled. Pt has support from sister and other family members but family is struggling to make medical decisions for pt at this time. CSW offered support and led family to resources that may be able to assist in getting temporary legal guardianship for pt's son with his caretaker [name]) at this time.   Employment status:  Other  (Comment) Insurance information:  Self Pay (Medicaid Pending) PT Recommendations:  Not assessed at this time Information / Referral to community resources:  APS (Comment Required: [place], Name & Number of worker spoken with) ([place])  Patient/Family's Response to care:  Pt's sister is understanding and agreeable to plan of care at this time.   Patient/Family's Understanding of and Emotional Response to Diagnosis, Current Treatment, and Prognosis:  No further questions or concerns have been presented to CSW at this time.   Emotional Assessment Appearance:  Appears older than stated age Attitude/Demeanor/Rapport:  Intubated (Following Commands or Not Following Commands) (pt was a bit agitated. ) Affect (typically observed):  Unable to Assess Orientation:   (pt is intubated at this time. ) Alcohol / Substance use:  Not Applicable Psych involvement (Current and /or in the community):  No (Comment)  Discharge Needs  Concerns to be addressed:  Decision making concerns, Care Coordination, Lack of Support Readmission within the last 30 days:  No Current discharge risk:  None Barriers to Discharge:  No Barriers Identified   Wetzel Bjornstad, [place] 04/13/2017, 11:27 AM

## 2017-04-13 NOTE — Progress Notes (Addendum)
CSW spoke with pt's family at bedside. Family is seeking assistance in getting pt's son the care that he needs while pt in the hospital. Per pt's sister, [name] has a care taker [name]) that has been providing care to pt's son for some time now. Pt's family as well as [name] are worried about if pt's son needs medical treatment, how would he get it if pt is unable to legally give permission to [name] to get the medical help Coralyn Mark needs. CSW reached out to [facility] and was informed that [name] can file a petition for temporary custody and they process would start there. At this time  CSW has given the information to pt's family as well as reached out to [place] (pt's CAP worker) to inform her of this.[name] is support for this family and has been dealing/working with them for awhile now. At this time there are no further CSW needs. CSW signing off, please re consult if new need arises.     [name], MSW, [facility] Clinical Social Worker [phone]

## 2017-04-13 NOTE — Progress Notes (Signed)
PULMONARY / CRITICAL CARE MEDICINE   Name: Dominic Phillips MRN: [id number] DOB: Apr 20, 1961    ADMISSION DATE:  04/11/2017 CONSULTATION DATE:  04/11/2017  REFERRING MD:  Emeline Gins Elgergawy  CHIEF COMPLAINT:  ARDS secondary to aspiration pneumonia complicating a diverticular abscess  BRIEF  56 y.o. W M transferred from [facility] for management of ARDS and progressive renal insufficiency. This gentleman has a long history of heavy ETOH abuse (~ 18 beers per day) and presented to [facility] on 04/02/2017 with a c/o of abdominal pain without fevers, leukocytosis or diarrhea. An abd CT revealed pericolonic inflammation/fluid collection, for which he was admitted for diverticular abscess. Was consulted by Gen Surg who suggested placement of a catheter by interventional radiology; subsequent cultures grew E. Coli, for which Zosyn was initiated. Hospital course was complicated by the development of DTs, treated with 1 beer tid and Ativan prn. He slowly improved but worsened on 10/23 with increased WBC and progressive hypoxemia. Repeat abdominal CT showed improving abscess but developing SBO and multilobar pneumonia. Hypoxemia could not be corrected with BiPAP, along with worsening renal function such that he was intubated on 10/25 and required pressors for hemodynamic support. CT chest/abdomen/pelvis on 10/26 showed bilateral pneumonia with volume overload; no change in the RLQ percutaneous drain catheter. With rising creat to 3.1 and 290 cc U/O over 24 hours, transfer was requested.   EVENTS 10/18 - admit [name]  04/11/2017 - admit [facility] (at [facility] had Left IJ CVL, RLQ drain and Art line)  SUBJECTIVE/OVERNIGHT/INTERVAL HX Afebrile overnight,  Sedation: Precedex 0.6 gtt with intermittent versed for agitation.  Currently off levophed with stable blood pressures in low 144Y systolic.  Making some urine but creat worse to 4.56 this morning. Sister and brother in law at bedside.   VITAL SIGNS: BP  108/89   Pulse 82   Temp 97.8 F (36.6 C) (Axillary)   Resp (!) 26   Ht 5\' 11"  (1.803 m)   Wt 162 lb 0.6 oz (73.5 kg)   SpO2 97%   BMI 22.60 kg/m   HEMODYNAMICS: Off Levophed at 2 mcg  VENTILATOR SETTINGS: Vent Mode: PSV;CPAP FiO2 (%):  [50 %] 50 % Set Rate:  [16 bmp-21 bmp] 21 bmp Vt Set:  [450 mL-600 mL] 450 mL PEEP:  [5 cmH20] 5 cmH20 Pressure Support:  [10 cmH20] 10 cmH20 Plateau Pressure:  [18 cmH20-25 cmH20] 19 cmH20  INTAKE / OUTPUT: I/O last 3 completed shifts: In: 3276.2 [I.V.:2972.8; IV Piggyback:303.3] Out: 645 [Urine:545; Emesis/NG output:50; Stool:50]  UOP 365 cc  PHYSICAL EXAMINATION:  General Appearance:    56 yo male, NAD  Head:    NCAT, ETT in place   Eyes:    PERRL, EOMI         Nose:   NG tube - yes  Throat:  ETT TUBE in place   Neck:   Supple,  No enlargement/tenderness/nodules     Lungs:     Bilateral Crackles. Barrel chest, Synchronous with vent   Chest wall:    No deformity  Heart:    S1 and S2 normal, no MRG   Abdomen:     Soft, no masses, no organomegaly        Extremities:   Extremities- cachectic     Skin:   Intact in exposed areas . Sacral area - none reported per rn     Neurologic:   Sedation - precedex -> RASS - -3   LABS:  LA 0.9 Trop <0.03 Pre-alb <5 Phos 3.1 Mag  1.9  ABG - pH 7.3, pCO2 48.1, bicarb 24.9   PULMONARY  Recent Labs Lab 04/11/17 1703 04/12/17 0227 04/12/17 0852 04/13/17 0218  PHART 7.304* 7.344* 7.337* 7.321*  PCO2ART 55.0* 48.4* 47.3 48.1*  PO2ART 103.0 108.0 106.0 179.0*  HCO3 27.3 26.3 25.3 24.9  TCO2 29 28 27 26   O2SAT 97.0 98.0 98.0 99.0   CBC  Recent Labs Lab 04/11/17 1850 04/12/17 0600 04/13/17 0520  HGB 7.9* 8.4* 7.7*  HCT 24.0* 25.4* 23.0*  WBC 14.2* 14.7* 11.6*  PLT 261 257 296   COAGULATION No results for input(s): INR in the last 168 hours.  CARDIAC    Recent Labs Lab 04/13/17 0520  TROPONINI <0.03   No results for input(s): PROBNP in the last 168  hours.  CHEMISTRY  Recent Labs Lab 04/11/17 1850 04/12/17 0600 04/12/17 1843 04/13/17 0050 04/13/17 0520  NA 133* 132* 133* 133* 133*  K 3.6 3.7 3.5 3.6 3.6  CL 97* 95* 97* 96* 98*  CO2 26 26 25 25 25   GLUCOSE 143* 139* 126* 119* 113*  BUN 31* 35* 37* 39* 41*  CREATININE 3.61* 3.93* 4.19* 4.34* 4.56*  CALCIUM 7.6* 7.9* 7.8* 7.8* 7.8*  MG  --  2.2  --   --  1.9  PHOS  --  2.0*  --   --  3.1   Estimated Creatinine Clearance: 19.3 mL/min (A) (by C-G formula based on SCr of 4.56 mg/dL (H)).  LIVER  Recent Labs Lab 04/12/17 0600 04/13/17 0520  AST 44* 44*  ALT 12* 16*  ALKPHOS 201* 188*  BILITOT 0.7 0.7  PROT 5.8* 5.7*  ALBUMIN 1.5* 1.3*   INFECTIOUS  Recent Labs Lab 04/13/17 0050  LATICACIDVEN 0.9   ENDOCRINE CBG (last 3)   Recent Labs  04/13/17 0026 04/13/17 0324 04/13/17 0748  GLUCAP 112* 109* 105*    IMAGING x48h  - image(s) personally visualized  -   highlighted in bold Dg Chest 1 View  Result Date: 04/11/2017 CLINICAL DATA:  Intubation. EXAM: CHEST 1 VIEW COMPARISON:  None. FINDINGS: Endotracheal tube is in place with tip approximately 6.0 cm above the carina. A nasogastric tube is in place with tip to level of the stomach. Left IJ central line tip overlies the level of the brachiocephalic confluence with the superior vena cava. Heart size is probably normal. There are diffuse pulmonary opacities throughout the lungs bilaterally, more confluent at the right lung base. Moderate right pleural effusion. IMPRESSION: 1. Diffuse bilateral pulmonary infiltrates, right greater than left. 2. Considerations include asymmetric pulmonary edema and/or infectious process. Electronically Signed   By: Nolon Nations M.D.   On: 04/11/2017 15:19   Dg Chest Port 1 View  Result Date: 04/12/2017 CLINICAL DATA:  ARDS and progressive renal insufficiency. This gentleman has a long history of heavy ETOH abuse. Pt remains on ventilator. EXAM: PORTABLE CHEST 1 VIEW COMPARISON:   Chest x-ray dated 04/11/2017. FINDINGS: Endotracheal tube well positioned with tip approximately 4 cm above the carina. Left IJ central line appears stable in position with tip approximating the junction of the brachiocephalic vein and upper SVC. Enteric tube passes below the stomach. Hazy reticulonodular opacities are again seen bilaterally, slightly worsened on the left, perhaps slightly improved on the right. Veiled opacities at each lung base suggests small layering pleural effusions and/or atelectasis. No pneumothorax seen. IMPRESSION: 1. Hazy reticulonodular opacities bilaterally, left greater than right, compatible with the given history of ARDS, possibly some component of pulmonary edema. 2. Bibasilar opacities, suggesting  small layering pleural effusions and/or atelectasis. 3. Support apparatus appears stable in position, as detailed above. Electronically Signed   By: Franki Cabot M.D.   On: 04/12/2017 07:35   Dg Abd Portable 1v  Result Date: 04/11/2017 CLINICAL DATA:  Encounter for imaging study to confirm orogastric tube placement. EXAM: PORTABLE ABDOMEN - 1 VIEW COMPARISON:  None. FINDINGS: Nasogastric tube is in the left upper abdomen probably in the gastric fundus. There is contrast in the colon but there are dilated loops of small bowel. Small bowel loops measure up to 4.3 cm. Patchy densities in the lower chest. IMPRESSION: Nasogastric tube is in the proximal stomach region. Dilated loops of small bowel in the abdomen. Findings are concerning for at least a partial small bowel obstruction. No comparison abdominal radiographs. Incompletely visualized lung disease. Electronically Signed   By: Markus Daft M.D.   On: 04/11/2017 15:32   IMAGING CXR 10.28 - Hazy reticulonodular opacities bilaterally, L>R, compatible with the given history of ARDS, possibly some component of pulmonary edema; bibasilar opacities suggesting small layering pleural effusions and/or atelectasis  ECHO 10/28 >> EF 55-60%  , no regional wall abnormality  CXR 10/29 >>   CULTURES: None  ANTIBIOTICS: Zosyn 10/27 >>   LINES/TUBES: ETT 10/25>>> L IJ 10/25>>> L radial 10/25>>> Liver drain 04/02/22>>> Foley  DISCUSSION: PCXR appears more compatible with interstitial edema as I do not see definite acinar shadows. Will adjust vent settings to minimize FIO2 but increase VE to correct hypercapnea and respiratory acidosis. 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  ASSESSMENT / PLAN:  PULMONARY Seems to have copd at baseline ARDS on cXR but ddx on volume overload  Full vent support Titrate O2 for sat of 88-92%  CARDIOVASCULAR Circulatory shock Stable off levo ECHO with EF 55-60% , no regional wall abnormality  Lasix x 1 dose Neg trop  May need new central line  RENAL AKI and now in ATN range Oliguric Consider nephro consult given worsening SCr  Assess fluid status daily  Daily BMET  Replete lytes as needed   GASTROINTESTINAL Diverticular abscess with +/- SBO- s/p RLQ drain at [place] between 04/02/17 and 04/11/17 IV Protonix  TPN per pharmacy Hold TF given SBO per surgery's recommendations  HEMATOLOGIC  Recent Labs Lab 04/11/17 1850 04/12/17 0600 04/13/17 0520  HGB 7.9* 8.4* 7.7*  HCT 24.0* 25.4* 23.0*  WBC 14.2* 14.7* 11.6*  PLT 261 257 296   Anemia of critical illness Hgb stable 7.7 Monitor CBC  INFECTIOUS Diverticular abscess at [facility] - s/p drain placed by IR  Afebrile with WBC 14.7, Continue Zosyn  DC Levo  ENDOCRINE No known prior hx  CBG's 105-120 ICU hyperglycemia protocol  NEUROLOGIC Head CT 10/26: No intracranial mass, hemorrhage or extra-axial fluid collection ETOH dependency with DT at [place] goal: -2 Precedex gtt to continue   FAMILY  - Updates: Spoke with family regarding plans at admit 04/11/2017 .  Sister and brother in law updated at bedside this morning.   Lovenia Kim, MD [facility] PGY-2   Attending Note:  56 year old alcoholic male presenting with SBO, liver abscess, pulmonary edema, renal failure and respiratory failure.  On exam, diffuse crackles.  I reviewed CXR myself, ETT ok, pulmonary edema and infiltrate noted.  Will start TPN, continue weaning as able.  KVO IVF.  Renal consult to be called.  Hold off extubation at this point.  Family updated bedside.  Continue precedex  drip.  CIWA.  Spoke with family, not sure if patient would want dialysis.  They are discussing this currently.  Social situation evidently is a major issue with a handicapped son that the patient takes care off.  Case manager to be called.  Will continue support for now.  The patient is critically ill with multiple organ systems failure and requires high complexity decision making for assessment and support, frequent evaluation and titration of therapies, application of advanced monitoring technologies and extensive interpretation of multiple databases.   Critical Care Time devoted to patient care services described in this note is  35  Minutes. This time reflects time of care of this signee Dr Jennet Maduro. This critical care time does not reflect procedure time, or teaching time or supervisory time of PA/NP/Med student/Med Resident etc but could involve care discussion time.  Rush Farmer, M.D. [facility] Pulmonary/Critical Care Medicine. Pager: [phone]. After hours pager: [phone].

## 2017-04-13 NOTE — Progress Notes (Signed)
Patient placed back on full support for rest as he is tiring out. Vitals are stable. RT will continue to monitor.

## 2017-04-13 NOTE — Progress Notes (Signed)
[facility] Progress Note Patient Name: Dominic Phillips DOB: [birth year] MRN: [id number]   Date of Service  04/13/2017  HPI/Events of Note  Request to renew bilateral soft wrist restraints.   eICU Interventions  Will renew bilateral soft wrist restraints.      Intervention Category Minor Interventions: Agitation / anxiety - evaluation and management  Alivya Wegman [place] 04/13/2017, 6:29 AM

## 2017-04-13 NOTE — Progress Notes (Signed)
Initial Nutrition Assessment  DOCUMENTATION CODES:   Non-severe (moderate) malnutrition in context of chronic illness  INTERVENTION:   TPN dosing per Pharmacy to meet 100% of nutrition needs  NUTRITION DIAGNOSIS:   Moderate Malnutrition related to chronic illness (ETOH abuse) as evidenced by mild fat depletion, mild muscle depletion.  GOAL:   Patient will meet greater than or equal to 90% of their needs  MONITOR:   Vent status, I & O's, Labs  REASON FOR ASSESSMENT:   Consult New TPN/TNA  ASSESSMENT:   56 year old male with PMH of ETOH abuse who was admitted to [facility] on 10/18 with diverticular abscess; hospital course complicated by DT's, development of a SBO, and PNA; required intubation on 10/25. He was transferred to [facility] on 10/27 with worsening renal failure, ARDS, aspiration PNA.  Discussed patient in ICU rounds and with RN today. Patient was receiving TPN at [facility] for prolonged ileus and possible SBO prior to transfer to [facility].  TPN being resumed today at 30 ml/h to provide 967 kcal, 52 gm protein per day. Patient is currently intubated on ventilator support MV: 9.8 L/min Temp (24hrs), Avg:98.2 F (36.8 C), Min:97.8 F (36.6 C), Max:98.5 F (36.9 C)   Labs reviewed. Sodium 133 (L), triglycerides 203 (H) CBG's: 109-105 Medications reviewed. I/O +2.95 L since admission, admit weight 150 lbs, up 12 lbs since admission to [place]:    Most Recent Value  Orbital Region  Mild depletion  Upper Arm Region  Mild depletion  Thoracic and Lumbar Region  Unable to assess  Buccal Region  Unable to assess  Temple Region  Mild depletion  Clavicle Bone Region  Mild depletion  Clavicle and Acromion Bone Region  Mild depletion  Scapular Bone Region  Unable to assess  Dorsal Hand  Unable to assess  Patellar Region  No depletion  Anterior Thigh Region  No depletion  Posterior Calf Region  Mild depletion  Edema (RD  Assessment)  None  Hair  Reviewed  Eyes  Unable to assess  Mouth  Unable to assess  Skin  Reviewed  Nails  Unable to assess       Diet Order:  Diet NPO time specified TPN ADULT (ION) TPN ADULT (ION)  EDUCATION NEEDS:   No education needs have been identified at this time  Skin:  Skin Assessment: Reviewed RN Assessment  Last BM:  10/28 (rectal tube)  Height:   Ht Readings from Last 1 Encounters:  04/11/17 5\' 11"  (1.803 m)    Weight:   Wt Readings from Last 1 Encounters:  04/13/17 162 lb 0.6 oz (73.5 kg)    Ideal Body Weight:  78.2 kg  BMI:  Body mass index is 22.6 kg/m.  Estimated Nutritional Needs:   Kcal:  1790  Protein:  105-120 gm  Fluid:  1.8 L    Molli Barrows, RD, LDN, [place] Pager [phone] After Hours Pager [phone]

## 2017-04-13 NOTE — Progress Notes (Signed)
Subjective/Chief Complaint: On vent, but awake   Objective: Vital signs in last 24 hours: Temp:  [97.8 F (36.6 C)-98.5 F (36.9 C)] 97.8 F (36.6 C) (10/29 0750) Pulse Rate:  [67-90] 82 (10/29 0804) Resp:  [13-26] 26 (10/29 0804) BP: (86-112)/(61-89) 108/89 (10/29 0804) SpO2:  [94 %-100 %] 97 % (10/29 0804) Arterial Line BP: (82-133)/(45-67) 118/58 (10/29 0700) FiO2 (%):  [50 %] 50 % (10/29 0804) Weight:  [73.5 kg (162 lb 0.6 oz)] 73.5 kg (162 lb 0.6 oz) (10/29 0333) Last BM Date: 04/11/17  Intake/Output from previous day: 10/28 0701 - 10/29 0700 In: 1975.5 [I.V.:1672.1; IV Piggyback:303.3] Out: 365 [Urine:365] Intake/Output this shift: No intake/output data recorded.  Exam: Abdomen full, minimally tender, drain with fluid that is almost stool tinged  Lab Results:   Recent Labs  04/12/17 0600 04/13/17 0520  WBC 14.7* PENDING  HGB 8.4* 7.7*  HCT 25.4* 23.0*  PLT 257 PENDING   BMET  Recent Labs  04/13/17 0050 04/13/17 0520  NA 133* 133*  K 3.6 3.6  CL 96* 98*  CO2 25 25  GLUCOSE 119* 113*  BUN 39* 41*  CREATININE 4.34* 4.56*  CALCIUM 7.8* 7.8*   PT/INR No results for input(s): LABPROT, INR in the last 72 hours. ABG  Recent Labs  04/12/17 0852 04/13/17 0218  PHART 7.337* 7.321*  HCO3 25.3 24.9    Studies/Results: Dg Chest 1 View  Result Date: 04/11/2017 CLINICAL DATA:  Intubation. EXAM: CHEST 1 VIEW COMPARISON:  None. FINDINGS: Endotracheal tube is in place with tip approximately 6.0 cm above the carina. A nasogastric tube is in place with tip to level of the stomach. Left IJ central line tip overlies the level of the brachiocephalic confluence with the superior vena cava. Heart size is probably normal. There are diffuse pulmonary opacities throughout the lungs bilaterally, more confluent at the right lung base. Moderate right pleural effusion. IMPRESSION: 1. Diffuse bilateral pulmonary infiltrates, right greater than left. 2. Considerations  include asymmetric pulmonary edema and/or infectious process. Electronically Signed   By: Nolon Nations M.D.   On: 04/11/2017 15:19   Dg Chest Port 1 View  Result Date: 04/12/2017 CLINICAL DATA:  ARDS and progressive renal insufficiency. This gentleman has a long history of heavy ETOH abuse. Pt remains on ventilator. EXAM: PORTABLE CHEST 1 VIEW COMPARISON:  Chest x-ray dated 04/11/2017. FINDINGS: Endotracheal tube well positioned with tip approximately 4 cm above the carina. Left IJ central line appears stable in position with tip approximating the junction of the brachiocephalic vein and upper SVC. Enteric tube passes below the stomach. Hazy reticulonodular opacities are again seen bilaterally, slightly worsened on the left, perhaps slightly improved on the right. Veiled opacities at each lung base suggests small layering pleural effusions and/or atelectasis. No pneumothorax seen. IMPRESSION: 1. Hazy reticulonodular opacities bilaterally, left greater than right, compatible with the given history of ARDS, possibly some component of pulmonary edema. 2. Bibasilar opacities, suggesting small layering pleural effusions and/or atelectasis. 3. Support apparatus appears stable in position, as detailed above. Electronically Signed   By: Franki Cabot M.D.   On: 04/12/2017 07:35   Dg Abd Portable 1v  Result Date: 04/11/2017 CLINICAL DATA:  Encounter for imaging study to confirm orogastric tube placement. EXAM: PORTABLE ABDOMEN - 1 VIEW COMPARISON:  None. FINDINGS: Nasogastric tube is in the left upper abdomen probably in the gastric fundus. There is contrast in the colon but there are dilated loops of small bowel. Small bowel loops measure up to  4.3 cm. Patchy densities in the lower chest. IMPRESSION: Nasogastric tube is in the proximal stomach region. Dilated loops of small bowel in the abdomen. Findings are concerning for at least a partial small bowel obstruction. No comparison abdominal radiographs.  Incompletely visualized lung disease. Electronically Signed   By: Markus Daft M.D.   On: 04/11/2017 15:32    Anti-infectives: Anti-infectives    Start     Dose/Rate Route Frequency Ordered Stop   04/13/17 2200  levofloxacin (LEVAQUIN) IVPB 500 mg  Status:  Discontinued     500 mg 100 mL/hr over 60 Minutes Intravenous Every 48 hours 04/12/17 0114 04/12/17 0846   04/12/17 0200  piperacillin-tazobactam (ZOSYN) IVPB 3.375 g     3.375 g 12.5 mL/hr over 240 Minutes Intravenous Every 8 hours 04/12/17 0103     04/12/17 0000  levofloxacin (LEVAQUIN) IVPB 750 mg  Status:  Discontinued     750 mg 100 mL/hr over 90 Minutes Intravenous Every 48 hours 04/11/17 2230 04/12/17 0114      Assessment/Plan: 56 y/o M with diverticulitis with abscess s/p IR drain Active Problems:   Acute respiratory distress syndrome (ARDS) (HCC) EtOH dependency Sepsis  Continue NPO and IR placed drain. TNA Could be developing a fistula.  Will leave drain in for a while. No frank peritonitis on exam, so hopefully can avoid an urgent partial colectomy/colostomy  LOS: 2 days    Ninah Moccio A 04/13/2017

## 2017-04-13 NOTE — Progress Notes (Signed)
PHARMACY - ADULT TOTAL PARENTERAL NUTRITION CONSULT NOTE   Pharmacy Consult:  TPN Indication:  Prolonged ileus, possible SBO  Patient Measurements: Height: 5\' 11"  (180.3 cm) Weight: 162 lb 0.6 oz (73.5 kg) IBW/kg (Calculated) : 75.3   Body mass index is 22.6 kg/m. *Using TBW on admit - 68 kg  Assessment:  19 YOM with PMH notable for heavy ETOH use and smoking admitted to [facility] on 04/02/17 with abdominal pain and fever.  Found to have diverticular abscess (IR catheter) and course complicated by DTs, possible SBO, PNA progressing into respiratory failure requiring intubation on 04/09/17 and pressors for hemodynamic instability.  Patient was transferred to [facility] on 04/11/17 for ARDS and progressive renal insufficiency.   TPN was not hung yesterday d/t access issues and D10W was not initiated.  Unsure whether patient had refeeding or not.  GI: BL prealbumin <5. NGT and rectal tube outputs not documented.  PPI IV Endo: no hx DM - CBGs 100-110s Insulin requirements in the past 24 hours: N/A Lytes: Na/CL low 133/98, K+ 3.6 (goal >/= 4 for ileus), Mag 1.9 (goal >/= 2 for ileus) Renal: AKI - SCr up 4.56, BUN up to 41, UOP low at 0.1 ml/kg/hr, net +2.9L since admit Pulm: respiratory acidosis, FiO2 50% - Duonebs Cards: VSS - off Levophed gtt Hepatobil: AST/ALT 44/16, AlkPhos down to 188, tbili WNL.  TG elevated at 203. Neuro: Precedex gtt, PRN Versed.  GCS 12, CPOT 0-4, RASS -2 (goal 0) ID: Zosyn/LVQ for IAI/HCAP - afebrile, WBC 11.6 Best Practices: PPI IV, Lovenox, CHG TPN Access: triple lumen CVC placed 04/11/17 TPN start date: 04/09/17 (at [place], last received lipids on 10/26)  Nutritional Goals (RD rec pending): 1700-2000 kCal and 88-102gm of protein per day   Current Nutrition:  NPO   Plan:  Once access established, restart customized TPN (AA/CHO: 72/31) at 30 ml/hr (goal 55 ml/hr) Hold 20% ILE d/t transfer to the ICU and elevated TG (resume by 11/10 to prevent  EFAD) TPN providing 966 kCal and 52gm of protein per day, meeting >57% of patient's needs  Lytes in TPN: K, Mag and Phos are reduced d/t AKI, max CL Daily multivitamin and trace elements in TPN Add thiamine 100mg  and folic acid 1mg  to TPN d/t hx EtOH use Continue sensitive SSI Q6H.  Watch CBGs closely given that CHO content in TPN is high d/t no lipids D/C IVF when TPN starts per administration order F/U AM labs   Hoa Briggs D. [name], PharmD, BCPS Pager:  [phone] 04/13/2017, 9:34 AM

## 2017-04-14 ENCOUNTER — Inpatient Hospital Stay (HOSPITAL_COMMUNITY): Payer: Medicaid Other

## 2017-04-14 DIAGNOSIS — G9340 Encephalopathy, unspecified: Secondary | ICD-10-CM

## 2017-04-14 DIAGNOSIS — F10239 Alcohol dependence with withdrawal, unspecified: Secondary | ICD-10-CM

## 2017-04-14 LAB — PHOSPHORUS: Phosphorus: 3.2 mg/dL (ref 2.5–4.6)

## 2017-04-14 LAB — BASIC METABOLIC PANEL
Anion gap: 10 (ref 5–15)
BUN: 47 mg/dL — AB (ref 6–20)
CHLORIDE: 100 mmol/L — AB (ref 101–111)
CO2: 24 mmol/L (ref 22–32)
Calcium: 7.9 mg/dL — ABNORMAL LOW (ref 8.9–10.3)
Creatinine, Ser: 4.77 mg/dL — ABNORMAL HIGH (ref 0.61–1.24)
GFR calc Af Amer: 15 mL/min — ABNORMAL LOW (ref >60)
GFR calc non Af Amer: 13 mL/min — ABNORMAL LOW (ref >60)
GLUCOSE: 145 mg/dL — AB (ref 65–99)
POTASSIUM: 3.4 mmol/L — AB (ref 3.5–5.1)
Sodium: 134 mmol/L — ABNORMAL LOW (ref 135–145)

## 2017-04-14 LAB — CBC WITH DIFFERENTIAL/PLATELET
BASOS PCT: 0 %
Basophils Absolute: 0 10*3/uL (ref 0.0–0.1)
EOS PCT: 2 %
Eosinophils Absolute: 0.2 10*3/uL (ref 0.0–0.7)
HEMATOCRIT: 22.5 % — AB (ref 39.0–52.0)
Hemoglobin: 7.5 g/dL — ABNORMAL LOW (ref 13.0–17.0)
LYMPHS PCT: 9 %
Lymphs Abs: 0.9 10*3/uL (ref 0.7–4.0)
MCH: 31.3 pg (ref 26.0–34.0)
MCHC: 33.3 g/dL (ref 30.0–36.0)
MCV: 93.8 fL (ref 78.0–100.0)
MONO ABS: 0.7 10*3/uL (ref 0.1–1.0)
MONOS PCT: 7 %
NEUTROS ABS: 8.7 10*3/uL — AB (ref 1.7–7.7)
Neutrophils Relative %: 82 %
Platelets: 277 10*3/uL (ref 150–400)
RBC: 2.4 MIL/uL — ABNORMAL LOW (ref 4.22–5.81)
RDW: 17.3 % — AB (ref 11.5–15.5)
WBC: 10.6 10*3/uL — ABNORMAL HIGH (ref 4.0–10.5)

## 2017-04-14 LAB — MAGNESIUM: Magnesium: 2 mg/dL (ref 1.7–2.4)

## 2017-04-14 LAB — BLOOD GAS, ARTERIAL
Acid-base deficit: 1.1 mmol/L (ref 0.0–2.0)
Bicarbonate: 24 mmol/L (ref 20.0–28.0)
DRAWN BY: 51133
FIO2: 40
MECHVT: 450 mL
O2 SAT: 97.4 %
PATIENT TEMPERATURE: 98.6
PCO2 ART: 46.9 mmHg (ref 32.0–48.0)
PEEP: 5 cmH2O
PH ART: 7.33 — AB (ref 7.350–7.450)
RATE: 21 resp/min
pO2, Arterial: 107 mmHg (ref 83.0–108.0)

## 2017-04-14 LAB — GLUCOSE, CAPILLARY
GLUCOSE-CAPILLARY: 139 mg/dL — AB (ref 65–99)
GLUCOSE-CAPILLARY: 154 mg/dL — AB (ref 65–99)
GLUCOSE-CAPILLARY: 118 mg/dL — AB (ref 65–99)
Glucose-Capillary: 114 mg/dL — ABNORMAL HIGH (ref 65–99)
Glucose-Capillary: 144 mg/dL — ABNORMAL HIGH (ref 65–99)
Glucose-Capillary: 147 mg/dL — ABNORMAL HIGH (ref 65–99)
Glucose-Capillary: 169 mg/dL — ABNORMAL HIGH (ref 65–99)

## 2017-04-14 MED ORDER — POTASSIUM CHLORIDE 10 MEQ/50ML IV SOLN
10.0000 meq | INTRAVENOUS | Status: AC
  Administered 2017-04-14 (×3): 10 meq via INTRAVENOUS
  Filled 2017-04-14 (×3): qty 50

## 2017-04-14 MED ORDER — LORAZEPAM 2 MG/ML IJ SOLN
0.5000 mg | Freq: Once | INTRAMUSCULAR | Status: AC
  Administered 2017-04-14: 0.5 mg via INTRAVENOUS
  Filled 2017-04-14: qty 1

## 2017-04-14 MED ORDER — TRACE MINERALS CR-CU-MN-SE-ZN 10-1000-500-60 MCG/ML IV SOLN
INTRAVENOUS | Status: AC
  Administered 2017-04-14: 18:00:00 via INTRAVENOUS
  Filled 2017-04-14: qty 536

## 2017-04-14 MED ORDER — CLONAZEPAM 1 MG PO TABS
1.0000 mg | ORAL_TABLET | Freq: Two times a day (BID) | ORAL | Status: DC
  Administered 2017-04-14: 1 mg via ORAL
  Filled 2017-04-14: qty 1

## 2017-04-14 MED ORDER — FUROSEMIDE 10 MG/ML IJ SOLN
80.0000 mg | Freq: Three times a day (TID) | INTRAMUSCULAR | Status: DC
  Administered 2017-04-14 – 2017-04-15 (×5): 80 mg via INTRAVENOUS
  Filled 2017-04-14 (×8): qty 8

## 2017-04-14 MED ORDER — INSULIN ASPART 100 UNIT/ML ~~LOC~~ SOLN
0.0000 [IU] | Freq: Four times a day (QID) | SUBCUTANEOUS | Status: DC
  Administered 2017-04-14 – 2017-04-15 (×3): 2 [IU] via SUBCUTANEOUS

## 2017-04-14 MED ORDER — CLONIDINE ORAL SUSPENSION 10 MCG/ML
0.2000 mg | Freq: Three times a day (TID) | ORAL | Status: DC
  Administered 2017-04-14 – 2017-04-15 (×4): 0.2 mg
  Filled 2017-04-14 (×10): qty 20

## 2017-04-14 NOTE — Progress Notes (Addendum)
PULMONARY / CRITICAL CARE MEDICINE   Name: Dominic Phillips MRN: [id number] DOB: [birth year]    ADMISSION DATE:  04/11/2017 CONSULTATION DATE:  04/11/2017  REFERRING MD:  Emeline Gins Elgergawy  CHIEF COMPLAINT:  ARDS secondary to aspiration pneumonia complicating a diverticular abscess  BRIEF  56 year old alcoholic male presenting with SBO, liver abscess, pulmonary edema, renal failure and respiratory failure. Transferred from [facility] for management of ARDS and progressive renal insufficiency. This gentleman has a long history of heavy ETOH abuse (~ 18 beers per day) and presented to [place] on 04/02/2017 with a c/o of abdominal pain without fevers, leukocytosis or diarrhea. An abd CT revealed pericolonic inflammation/fluid collection, for which he was admitted for diverticular abscess. Was consulted by Gen Surg who suggested placement of a catheter by interventional radiology; subsequent cultures grew E. Coli, for which Zosyn was initiated. Hospital course was complicated by the development of DTs, treated with 1 beer tid and Ativan prn. He slowly improved but worsened on 10/23 with increased WBC and progressive hypoxemia. Repeat abdominal CT showed improving abscess but developing SBO and multilobar pneumonia. Hypoxemia could not be corrected with BiPAP, along with worsening renal function such that he was intubated on 10/25 and required pressors for hemodynamic support. CT chest/abdomen/pelvis on 10/26 showed bilateral pneumonia with volume overload; no change in the RLQ percutaneous drain catheter. With rising creat to 3.1 and 290 cc U/O over 24 hours, transfer was requested.   EVENTS 10/18 - Admit Dominic Phillips  04/11/2017 - Admit Dominic Phillips (at [facility] had Left IJ CVL, RLQ drain and Art line)  SUBJECTIVE/OVERNIGHT/INTERVAL HX Remained afebrile with no acute events overnight.  Sedation: Precedex gtt at 1.2 with intermittent versed given 3x for agitation throughout the night.  Currently off levophed  with stable blood pressures in low 086P systolic.   VITAL SIGNS: BP 103/74   Pulse 82   Temp 97.8 F (36.6 C) (Oral)   Resp (!) 28   Ht 5\' 11"  (1.803 m)   Wt 162 lb 11.2 oz (73.8 kg)   SpO2 97%   BMI 22.69 kg/m   HEMODYNAMICS:  VENTILATOR SETTINGS: Vent Mode: PSV;CPAP FiO2 (%):  [40 %-50 %] 40 % Set Rate:  [21 bmp] 21 bmp Vt Set:  [450 mL] 450 mL PEEP:  [5 cmH20] 5 cmH20 Pressure Support:  [10 cmH20] 10 cmH20 Plateau Pressure:  [18 cmH20-19 cmH20] 18 cmH20  INTAKE / OUTPUT: I/O last 3 completed shifts: In: 1958.9 [I.V.:1858.9; IV Piggyback:100] Out: 455 [Urine:455]  UOP 255 cc  PHYSICAL EXAMINATION:  General Appearance:    56 yo male, NAD   Head:    NCAT, ETT in place   Eyes:    PERRL        Nose:   NG tube - yes  Throat:  ETT TUBE in place   Neck:   Supple,  No enlargement/tenderness/nodules     Lungs:     Bilateral Crackles. Barrel chest, Synchronous with vent   Chest wall:    No deformity  Heart:    S1 and S2 normal, no MRG   Abdomen:     Mildly distended, no masses, no organomegaly        Extremities:   Extremities- cachectic     Skin:   Intact in exposed areas     Neurologic:   Sedation - precedex -> RASS - -3   LABS:  LA 0.9 Trop <0.03 Pre-alb <5 ABG - pH 7.3, pCO2 48.1, bicarb 24.9  Fibrinogen >800 plt 298 INR  15.5 high  APTT 35 wnl Iron 16 low TIBC low 123 Urine Na 60 Phos 3.2 Mag 2.0   PULMONARY  Recent Labs Lab 04/11/17 1703 04/12/17 0227 04/12/17 0852 04/13/17 0218 04/14/17 0425  PHART 7.304* 7.344* 7.337* 7.321* 7.330*  PCO2ART 55.0* 48.4* 47.3 48.1* 46.9  PO2ART 103.0 108.0 106.0 179.0* 107  HCO3 27.3 26.3 25.3 24.9 24.0  TCO2 29 28 27 26   --   O2SAT 97.0 98.0 98.0 99.0 97.4   CBC  Recent Labs Lab 04/12/17 0600 04/13/17 0520 04/13/17 0952 04/14/17 0333  HGB 8.4* 7.7* 7.9* 7.5*  HCT 25.4* 23.0* 23.7* 22.5*  WBC 14.7* 11.6*  --  10.6*  PLT 257 296 298 277   COAGULATION  Recent Labs Lab 04/13/17 0952   INR 1.24   CARDIAC  Recent Labs Lab 04/13/17 0520  TROPONINI <0.03   No results for input(s): PROBNP in the last 168 hours.  CHEMISTRY  Recent Labs Lab 04/12/17 0600 04/12/17 1843 04/13/17 0050 04/13/17 0520 04/14/17 0333  NA 132* 133* 133* 133* 134*  K 3.7 3.5 3.6 3.6 3.4*  CL 95* 97* 96* 98* 100*  CO2 26 25 25 25 24   GLUCOSE 139* 126* 119* 113* 145*  BUN 35* 37* 39* 41* 47*  CREATININE 3.93* 4.19* 4.34* 4.56* 4.77*  CALCIUM 7.9* 7.8* 7.8* 7.8* 7.9*  MG 2.2  --   --  1.9 2.0  PHOS 2.0*  --   --  3.1 3.2   Estimated Creatinine Clearance: 18.5 mL/min (A) (by C-G formula based on SCr of 4.77 mg/dL (H)).  LIVER  Recent Labs Lab 04/12/17 0600 04/13/17 0520 04/13/17 0952  AST 44* 44*  --   ALT 12* 16*  --   ALKPHOS 201* 188*  --   BILITOT 0.7 0.7  --   PROT 5.8* 5.7*  --   ALBUMIN 1.5* 1.3*  --   INR  --   --  1.24   INFECTIOUS  Recent Labs Lab 04/13/17 0050  LATICACIDVEN 0.9   ENDOCRINE CBG (last 3)   Recent Labs  04/13/17 2335 04/14/17 0336 04/14/17 0723  GLUCAP 134* 139* 144*   IMAGING x48h  - image(s) personally visualized  -   highlighted in bold US Renal  Result Date: 04/14/2017 CLINICAL DATA:  Acute kidney injury. EXAM: RENAL / URINARY TRACT ULTRASOUND COMPLETE COMPARISON:  None. FINDINGS: Right Kidney: Length: 12.0 cm. Borderline increased echogenicity. No mass or hydronephrosis visualized. Left Kidney: Length: 11.8 cm. Borderline increased echogenicity. No mass or hydronephrosis visualized. Bladder: Decompressed by Foley catheter and not assessed. Bilateral pleural effusions and intra-abdominal ascites is incidentally noted. Right upper quadrant fluid appears complex. IMPRESSION: 1. No obstructive uropathy. Borderline increased renal echogenicity which can be seen with chronic medical renal disease. 2. Incidental note of intra-abdominal ascites and bilateral pleural effusions. Right upper quadrant fluid is complex. Recommend correlation with  any outside imaging if available. Electronically Signed   By: Jeb Levering M.D.   On: 04/14/2017 00:07   Dg Chest Port 1 View  Result Date: 04/13/2017 CLINICAL DATA:  Cough, shortness of breath. EXAM: PORTABLE CHEST 1 VIEW COMPARISON:  Radiograph of April 12, 2017. FINDINGS: The heart size and mediastinal contours are within normal limits. Endotracheal and nasogastric tubes are unchanged in position. Stable position of left internal jugular catheter is noted with distal tip in expected position of left brachiocephalic vein. No pneumothorax is noted. Diffuse reticular interstitial densities are noted throughout both lungs most consistent with edema or inflammation. Mild  right pleural effusion is noted. The visualized skeletal structures are unremarkable. IMPRESSION: Stable support apparatus. Stable bilateral diffuse lung densities are noted concerning for edema or inflammation. Mild right pleural effusion. Electronically Signed   By: Marijo Conception, M.D.   On: 04/13/2017 08:52   IMAGING CXR 10.28 - Hazy reticulonodular opacities bilaterally, L>R, compatible with the given history of ARDS, possibly some component of pulmonary edema; bibasilar opacities suggesting small layering pleural effusions and/or atelectasis  ECHO 10/28 >> EF 55-60% , no regional wall abnormality  CXR 10/29 >>Stable bilateral diffuse lung densities are noted concerning for edema or inflammation. Mild right pleural effusion. Renal US 10/29>> no obstructive uropathy, borderline increased echogenicity which can be seen with chronic medical renal disease.  Intra-abdominal ascites present and bilateral pleural effusions.   CXR 10/30 >>   CULTURES: None  ANTIBIOTICS: Zosyn 10/27 >>   LINES/TUBES: ETT 10/25>>> L IJ 10/25>>> L radial 10/25>>> Liver drain 04/02/22>>> Foley   ASSESSMENT / PLAN:  PULMONARY Seems to have copd at baseline ARDS on cXR but ddx on volume overload  Full vent support  Titrate O2 for sat of  88-92%  CARDIOVASCULAR Circulatory shock Tele CVP monitoring Pressures are stable off Levophed ECHO with EF 55-60% , no regional wall abnormality  CVP 8 10/29 and lasix held, improved to 14 this morning   RENAL AKI with oliguria  Nephro consulted, lasix today and if no response will consider dialysis in AM Renal US 10/29 with no obstructive uropathy  Iron studies, urine protein/cr ratio Consider HD placement for temporary dialysis  IVF @ KVO  Assess fluid status daily  Daily BMET  Replete lytes as needed   GASTROINTESTINAL Diverticular abscess with +/- SBO- s/p RLQ drain at [place] between 04/02/17 and 04/11/17 IV Protonix  TPN per pharmacy  HEMATOLOGIC  Recent Labs Lab 04/12/17 0600 04/13/17 0520 04/13/17 0952 04/14/17 0333  HGB 8.4* 7.7* 7.9* 7.5*  HCT 25.4* 23.0* 23.7* 22.5*  WBC 14.7* 11.6*  --  10.6*  PLT 257 296 298 277   Anemia of critical illness Hgb stable 7.7 Monitor CBC  INFECTIOUS Diverticular abscess at [facility] - s/p drain placed by IR  Afebrile with WBC 10.6 Continue Zosyn   ENDOCRINE No known prior hx  CBG's 130-140 ICU hyperglycemia protocol  NEUROLOGIC Head CT 10/26: No intracranial mass, hemorrhage or extra-axial fluid collection ETOH dependency with DT at [place] goal: -2 Precedex gtt to continue Versed PRN for agitation  Will wean as able    FAMILY  - Updates: Spoke with family regarding plans at admit 04/11/2017 .  Sister and brother in law updated at bedside this morning.  Case manager called regarding complex social situation at home and support provided.   Lovenia Kim, MD [place] PGY-2   Attending Note:  56 year male with etoh history in withdrawal and with a liver abscess and SBO on TPN.  Renal failure, off pressors.  On exam, lungs are clear.  I reviewed CXR myself, ETT ok.  E. Coli from abscess from [place].  Will consult with ID about choice of abx.  High dose lasix today.  Hold off HD for now.  Continue  weaning.  Hold off intubation.  Replace electrolytes.  Labs in AM ordered.  PCCM will continue to follow.  Add clonidine and clonazepam.  The patient is critically ill with multiple organ systems failure and requires high complexity decision making for assessment and support, frequent evaluation and titration of therapies, application of advanced monitoring  technologies and extensive interpretation of multiple databases.   Critical Care Time devoted to patient care services described in this note is  35  Minutes. This time reflects time of care of this signee Dr Jennet Maduro. This critical care time does not reflect procedure time, or teaching time or supervisory time of PA/NP/Med student/Med Resident etc but could involve care discussion time.  Rush Farmer, M.D. [facility] Pulmonary/Critical Care Medicine. Pager: [phone]. After hours pager: [phone].

## 2017-04-14 NOTE — Progress Notes (Signed)
S: Dominic Phillips remains intubated today, he does not appear uncomfortable. Sister and brother in law are at bedside, they are updated on the plan.   O:BP 121/79   Pulse (!) 102   Temp 97.8 F (36.6 C) (Oral)   Resp (!) 31   Ht _0  (1.803 m)   Wt 162 lb 11.2 oz (73.8 kg)   SpO2 98%   BMI 22.69 kg/m   Intake/Output Summary (Last 24 hours) at 04/14/17 1146 Last data filed at 04/14/17 1025  Gross per 24 hour  Intake          1311.03 ml  Output              275 ml  Net          1036.03 ml   Intake/Output: I/O last 3 completed shifts: In: 1958.9 [I.V.:1858.9; IV Piggyback:100] Out: 455 [Urine:455]  Intake/Output this shift:  Total I/O In: 269.6 [I.V.:269.6] Out: 60 [Urine:60] Weight change: 10.6 oz (0.3 kg) Gen: no acute distress  CVS: distant heart sounds, RRR, no murmur appreciated  Resp: ETT, lungs sound clear over anterior lung fields  Abd: BS -, distended, soft, non tender Ext: 1+ b/l upper extremity pitting edema   Recent Labs Lab 04/11/17 1850 04/12/17 0600 04/12/17 1843 04/13/17 0050 04/13/17 0520 04/14/17 0333  NA 133* 132* 133* 133* 133* 134*  K 3.6 3.7 3.5 3.6 3.6 3.4*  CL 97* 95* 97* 96* 98* 100*  CO2 _1 GLUCOSE 143* 139* 126* 119* 113* 145*  BUN 31* 35* 37* 39* 41* 47*  CREATININE 3.61* 3.93* 4.19* 4.34* 4.56* 4.77*  ALBUMIN  --  1.5*  --   --  1.3*  --   CALCIUM 7.6* 7.9* 7.8* 7.8* 7.8* 7.9*  PHOS  --  2.0*  --   --  3.1 3.2  AST  --  44*  --   --  44*  --   ALT  --  12*  --   --  16*  --    Liver Function Tests:  Recent Labs Lab 04/12/17 0600 04/13/17 0520  AST 44* 44*  ALT 12* 16*  ALKPHOS 201* 188*  BILITOT 0.7 0.7  PROT 5.8* 5.7*  ALBUMIN 1.5* 1.3*   No results for input(s): LIPASE, AMYLASE in the last 168 hours. No results for input(s): AMMONIA in the last 168 hours. CBC:  Recent Labs Lab 04/11/17 1850 04/12/17 0600 04/13/17 0520 04/13/17 0952 04/14/17 0333  WBC 14.2* 14.7* 11.6*  --  10.6*  NEUTROABS  12.6* 13.1* 9.9*  --  8.7*  HGB 7.9* 8.4* 7.7* 7.9* 7.5*  HCT 24.0* 25.4* 23.0* 23.7* 22.5*  MCV 96.4 94.4 94.3  --  93.8  PLT 261 257 296 298 277   Cardiac Enzymes:  Recent Labs Lab 04/13/17 0520  TROPONINI <0.03   CBG:  Recent Labs Lab 04/13/17 1954 04/13/17 2335 04/14/17 0336 04/14/17 0723 04/14/17 1143  GLUCAP 132* 134* 139* 144* 154*    Iron Studies:  Recent Labs  04/13/17 1639  IRON 16*  TIBC 123*   Studies/Results: US Renal  Result Date: 04/14/2017 CLINICAL DATA:  Acute kidney injury. EXAM: RENAL / URINARY TRACT ULTRASOUND COMPLETE COMPARISON:  None. FINDINGS: Right Kidney: Length: 12.0 cm. Borderline increased echogenicity. No mass or hydronephrosis visualized. Left Kidney: Length: 11.8 cm. Borderline increased echogenicity. No mass or hydronephrosis visualized. Bladder: Decompressed by Foley catheter and not assessed. Bilateral pleural effusions and intra-abdominal ascites is incidentally noted. Right  upper quadrant fluid appears complex. IMPRESSION: 1. No obstructive uropathy. Borderline increased renal echogenicity which can be seen with chronic medical renal disease. 2. Incidental note of intra-abdominal ascites and bilateral pleural effusions. Right upper quadrant fluid is complex. Recommend correlation with any outside imaging if available. Electronically Signed   By: Jeb Levering M.D.   On: 04/14/2017 00:07   Dg Chest Port 1 View  Result Date: 04/14/2017 CLINICAL DATA:  History of the ET tube placement EXAM: PORTABLE CHEST 1 VIEW COMPARISON:  04/13/2017 FINDINGS: The ET tube tip is above the carina. Left IJ catheter tip is in the projection of the SVC. There is an enteric tube with tip below the field of view. There is diffuse pulmonary edema. Moderate size right pleural effusion. IMPRESSION: 1. ARDS pattern is unchanged when compared with the previous exam. Electronically Signed   By: Kerby Moors M.D.   On: 04/14/2017 08:23   Dg Chest Port 1  View  Result Date: 04/13/2017 CLINICAL DATA:  Cough, shortness of breath. EXAM: PORTABLE CHEST 1 VIEW COMPARISON:  Radiograph of April 12, 2017. FINDINGS: The heart size and mediastinal contours are within normal limits. Endotracheal and nasogastric tubes are unchanged in position. Stable position of left internal jugular catheter is noted with distal tip in expected position of left brachiocephalic vein. No pneumothorax is noted. Diffuse reticular interstitial densities are noted throughout both lungs most consistent with edema or inflammation. Mild right pleural effusion is noted. The visualized skeletal structures are unremarkable. IMPRESSION: Stable support apparatus. Stable bilateral diffuse lung densities are noted concerning for edema or inflammation. Mild right pleural effusion. Electronically Signed   By: Marijo Conception, M.D.   On: 04/13/2017 08:52   . chlorhexidine gluconate (MEDLINE KIT)  15 mL Mouth Rinse BID  . Chlorhexidine Gluconate Cloth  6 each Topical Daily  . clonazePAM  1 mg Oral BID  . cloNIDine  0.2 mg Per Tube TID  . furosemide  80 mg Intravenous TID  . heparin subcutaneous  5,000 Units Subcutaneous Q8H  . insulin aspart  0-9 Units Subcutaneous Q6H  . ipratropium-albuterol  3 mL Nebulization Q6H  . mouth rinse  15 mL Mouth Rinse 10 times per day  . pantoprazole (PROTONIX) IV  40 mg Intravenous QHS  . sodium chloride flush  10-40 mL Intracatheter Q12H    BMET    Component Value Date/Time   NA 134 (L) 04/14/2017 0333   K 3.4 (L) 04/14/2017 0333   CL 100 (L) 04/14/2017 0333   CO2 24 04/14/2017 0333   GLUCOSE 145 (H) 04/14/2017 0333   BUN 47 (H) 04/14/2017 0333   CREATININE 4.77 (H) 04/14/2017 0333   CALCIUM 7.9 (L) 04/14/2017 0333   GFRNONAA 13 (L) 04/14/2017 0333   GFRAA 15 (L) 04/14/2017 0333   CBC    Component Value Date/Time   WBC 10.6 (H) 04/14/2017 0333   RBC 2.40 (L) 04/14/2017 0333   HGB 7.5 (L) 04/14/2017 0333   HCT 22.5 (L) 04/14/2017 0333   PLT  277 04/14/2017 0333   MCV 93.8 04/14/2017 0333   MCH 31.3 04/14/2017 0333   MCHC 33.3 04/14/2017 0333   RDW 17.3 (H) 04/14/2017 0333   LYMPHSABS 0.9 04/14/2017 0333   MONOABS 0.7 04/14/2017 0333   EOSABS 0.2 04/14/2017 0333   BASOSABS 0.0 04/14/2017 0333     Assessment/Plan:  1. AKI (unknown b/l) - Crt trended up slightly however he had significant increase in his UOP with increased dose of lasix yesterday.  Will increased the frequency of lasix today to 80 mg TID and continue to monitor for crt plateau. UPC is not consistent with nephrotic range proteinuria and Ultrasound showed borderline renal echogenicity.  2. Hypokalemia - repleted with 2 runs of K-Cl 3. Anemia of chronic disease - iron studies are consistent with anemia of chronic disease, T sat 13 so he may benefit from IV iron  4. Mineral metabolic disorder - calcium wnl when corrected for low albumin, phos WNL  5. VDRF - PCCM is managing 1. Cardiac shock - stable, not requiring pressors   2. Diverticular abscess with ?SBO s/p drain placed by IR, on zosyn  Ledell Noss, PGY 2

## 2017-04-14 NOTE — Progress Notes (Signed)
PHARMACY - ADULT TOTAL PARENTERAL NUTRITION CONSULT NOTE   Pharmacy Consult:  TPN Indication:  Prolonged ileus, possible SBO  Patient Measurements: Height: 5\' 11"  (180.3 cm) Weight: 162 lb 11.2 oz (73.8 kg) IBW/kg (Calculated) : 75.3   Body mass index is 22.69 kg/m. *Using TBW on admit - 68 kg  Assessment:  82 YOM with PMH notable for heavy ETOH use and smoking admitted to [facility] on 04/[facility]/17 with abdominal pain and fever.  Found to have diverticular abscess (IR catheter) and course complicated by DTs, possible SBO, PNA progressing into respiratory failure requiring intubation on 04/09/17 and pressors for hemodynamic instability.  Patient was transferred to [facility] on 04/11/17 for ARDS and progressive renal insufficiency.   TPN was not hung yesterday d/t access issues and D10W was not initiated.  Unsure whether patient had refeeding or not.  GI: BL prealbumin <5. NGT and rectal tube outputs not documented. Last BM 10/28. Albumin low at 1.3. Prealbumin less than 5. PPI IV Endo: No hx DM. CBGs remain controlled but trending up to 130-140s. Insulin requirements in the past 24 hours: N/A Lytes: Na/CL remain low but stable. K+ down to 3.4 today (goal >/= 4 for ileus), Mag 2.0 (goal >/= 2 for ileus). Phos wnl. CoCa 10.1. Renal: AKI - SCr up to 4.77, BUN up to 47, UOP low but up a little to 0.3 ml/kg/hr, net +3.7 L since admit. Will shoot for lower fluid goal since net positive so far on admit. Pulm: respiratory acidosis, FiO2 50% - Duonebs Cards: VSS - off Levophed gtt Hepatobil: AST/ALT 44/16, AlkPhos down to 188, tbili WNL. TG elevated at 203. Neuro: Precedex gtt, PRN Versed.  GCS 12, CPOT 0-4, RASS 3 (goal 0) ID: Zosyn for IAI/HCAP. Has diverticular abscess now s/p IR drainage. Afebrile, WBC 11.6  Best Practices: PPI IV, Hep Solomon TPN Access: triple lumen CVC placed 04/11/17 TPN start date: 04/09/17 (at [place], last received lipids on 10/26)  Nutritional Goals (RD rec from  10/29): 1790 kCal  105-120 gm of protein per day Fluid: 1.8 L (remains on Precedex ~469mL per day)   Current Nutrition:  NPO Customized TPN  Plan:  Increase customized TPN to 50 ml/hr Lytes adjusted in TPN: increased K, max CL Hold 20% lipid emulsion for first 7 days for ICU patients per ASPEN guidelines (Start date 11/4) This TPN provides 80.4 g of protein and 312 g of dextrose which gives a total of 1,382 kcals per day. Will continue to increase to goal as tolerated Add thiamine 100mg  and folic acid 1mg  to TPN Add MVI and trace elements to TPN Continue NS at 8ml/hr Add moderate SSI Q6h and adjust as needed Monitor TPN labs, Bmet, Mg, and Phos tomorrow, BUN F/U   Give KCl 23mEq IV x 3 runs this morning   Elenor Quinones, PharmD, [facility] Clinical Pharmacist Pager [phone] 04/14/2017 7:41 AM

## 2017-04-14 NOTE — Progress Notes (Signed)
Subjective/Chief Complaint: On vent   Objective: Vital signs in last 24 hours: Temp:  [97.8 F (36.6 C)-98.6 F (37 C)] 97.8 F (36.6 C) (10/30 0724) Pulse Rate:  [72-101] 78 (10/30 0800) Resp:  [13-28] 22 (10/30 0800) BP: (85-130)/(63-85) 98/66 (10/30 0800) SpO2:  [94 %-100 %] 95 % (10/30 0800) Arterial Line BP: (156-203)/(114-140) 170/115 (10/29 1700) FiO2 (%):  [40 %] 40 % (10/30 0734) Weight:  [73.8 kg (162 lb 11.2 oz)] 73.8 kg (162 lb 11.2 oz) (10/30 0345) Last BM Date: 04/12/17  Intake/Output from previous day: 10/29 0701 - 10/30 0700 In: 1291.2 [I.V.:1191.2; IV Piggyback:100] Out: 255 [Urine:255] Intake/Output this shift: Total I/O In: 155.4 [I.V.:155.4] Out: 20 [Urine:20]  General appearance: no distress Resp: few rhonchi Cardio: regular rate and rhythm and tachy GI: soft, ? TTP lower, clearly no peritonitis, drain with stool/pus  Lab Results:   Recent Labs  04/13/17 0520 04/13/17 0952 04/14/17 0333  WBC 11.6*  --  10.6*  HGB 7.7* 7.9* 7.5*  HCT 23.0* 23.7* 22.5*  PLT 296 298 277   BMET  Recent Labs  04/13/17 0520 04/14/17 0333  NA 133* 134*  K 3.6 3.4*  CL 98* 100*  CO2 25 24  GLUCOSE 113* 145*  BUN 41* 47*  CREATININE 4.56* 4.77*  CALCIUM 7.8* 7.9*   PT/INR  Recent Labs  04/13/17 0952  LABPROT 15.5*  INR 1.24   ABG  Recent Labs  04/13/17 0218 04/14/17 0425  PHART 7.321* 7.330*  HCO3 24.9 24.0    Studies/Results: US Renal  Result Date: 04/14/2017 CLINICAL DATA:  Acute kidney injury. EXAM: RENAL / URINARY TRACT ULTRASOUND COMPLETE COMPARISON:  None. FINDINGS: Right Kidney: Length: 12.0 cm. Borderline increased echogenicity. No mass or hydronephrosis visualized. Left Kidney: Length: 11.8 cm. Borderline increased echogenicity. No mass or hydronephrosis visualized. Bladder: Decompressed by Foley catheter and not assessed. Bilateral pleural effusions and intra-abdominal ascites is incidentally noted. Right upper quadrant  fluid appears complex. IMPRESSION: 1. No obstructive uropathy. Borderline increased renal echogenicity which can be seen with chronic medical renal disease. 2. Incidental note of intra-abdominal ascites and bilateral pleural effusions. Right upper quadrant fluid is complex. Recommend correlation with any outside imaging if available. Electronically Signed   By: Jeb Levering M.D.   On: 04/14/2017 00:07   Dg Chest Port 1 View  Result Date: 04/14/2017 CLINICAL DATA:  History of the ET tube placement EXAM: PORTABLE CHEST 1 VIEW COMPARISON:  04/13/2017 FINDINGS: The ET tube tip is above the carina. Left IJ catheter tip is in the projection of the SVC. There is an enteric tube with tip below the field of view. There is diffuse pulmonary edema. Moderate size right pleural effusion. IMPRESSION: 1. ARDS pattern is unchanged when compared with the previous exam. Electronically Signed   By: Kerby Moors M.D.   On: 04/14/2017 08:23   Dg Chest Port 1 View  Result Date: 04/13/2017 CLINICAL DATA:  Cough, shortness of breath. EXAM: PORTABLE CHEST 1 VIEW COMPARISON:  Radiograph of April 12, 2017. FINDINGS: The heart size and mediastinal contours are within normal limits. Endotracheal and nasogastric tubes are unchanged in position. Stable position of left internal jugular catheter is noted with distal tip in expected position of left brachiocephalic vein. No pneumothorax is noted. Diffuse reticular interstitial densities are noted throughout both lungs most consistent with edema or inflammation. Mild right pleural effusion is noted. The visualized skeletal structures are unremarkable. IMPRESSION: Stable support apparatus. Stable bilateral diffuse lung densities are noted concerning  for edema or inflammation. Mild right pleural effusion. Electronically Signed   By: Marijo Conception, M.D.   On: 04/13/2017 08:52    Anti-infectives: Anti-infectives    Start     Dose/Rate Route Frequency Ordered Stop   04/13/17  2200  levofloxacin (LEVAQUIN) IVPB 500 mg  Status:  Discontinued     500 mg 100 mL/hr over 60 Minutes Intravenous Every 48 hours 04/12/17 0114 04/12/17 0846   04/13/17 1800  piperacillin-tazobactam (ZOSYN) IVPB 2.25 g     2.25 g 100 mL/hr over 30 Minutes Intravenous Every 8 hours 04/13/17 1050     04/12/17 0200  piperacillin-tazobactam (ZOSYN) IVPB 3.375 g  Status:  Discontinued     3.375 g 12.5 mL/hr over 240 Minutes Intravenous Every 8 hours 04/12/17 0103 04/13/17 1050   04/12/17 0000  levofloxacin (LEVAQUIN) IVPB 750 mg  Status:  Discontinued     750 mg 100 mL/hr over 90 Minutes Intravenous Every 48 hours 04/11/17 2230 04/12/17 0114      Assessment/Plan: 56 y/o M with diverticulitis with abscess s/p IR drain Active Problems:   Acute respiratory distress syndrome (ARDS) (HCC) EtOH dependency Sepsis  Continue NPO and IR placed drain. TNA No peritonitis on exam I spoke with his family at the bedside  LOS: 3 days    Rubbie Goostree E 04/14/2017

## 2017-04-15 ENCOUNTER — Inpatient Hospital Stay (HOSPITAL_COMMUNITY): Payer: Medicaid Other

## 2017-04-15 ENCOUNTER — Other Ambulatory Visit (HOSPITAL_COMMUNITY): Payer: Self-pay | Admitting: Radiology

## 2017-04-15 DIAGNOSIS — N179 Acute kidney failure, unspecified: Secondary | ICD-10-CM

## 2017-04-15 DIAGNOSIS — J9601 Acute respiratory failure with hypoxia: Secondary | ICD-10-CM

## 2017-04-15 DIAGNOSIS — K750 Abscess of liver: Secondary | ICD-10-CM

## 2017-04-15 LAB — CBC WITH DIFFERENTIAL/PLATELET
BASOS PCT: 0 %
Basophils Absolute: 0 10*3/uL (ref 0.0–0.1)
Eosinophils Absolute: 0.2 10*3/uL (ref 0.0–0.7)
Eosinophils Relative: 2 %
HEMATOCRIT: 23.5 % — AB (ref 39.0–52.0)
Hemoglobin: 7.7 g/dL — ABNORMAL LOW (ref 13.0–17.0)
LYMPHS ABS: 0.8 10*3/uL (ref 0.7–4.0)
Lymphocytes Relative: 7 %
MCH: 30.8 pg (ref 26.0–34.0)
MCHC: 32.8 g/dL (ref 30.0–36.0)
MCV: 94 fL (ref 78.0–100.0)
MONO ABS: 1.1 10*3/uL — AB (ref 0.1–1.0)
MONOS PCT: 10 %
NEUTROS ABS: 9.2 10*3/uL — AB (ref 1.7–7.7)
Neutrophils Relative %: 81 %
Platelets: 278 10*3/uL (ref 150–400)
RBC: 2.5 MIL/uL — ABNORMAL LOW (ref 4.22–5.81)
RDW: 17.4 % — AB (ref 11.5–15.5)
WBC: 11.4 10*3/uL — ABNORMAL HIGH (ref 4.0–10.5)

## 2017-04-15 LAB — RENAL FUNCTION PANEL
Albumin: 1.4 g/dL — ABNORMAL LOW (ref 3.5–5.0)
Anion gap: 9 (ref 5–15)
BUN: 46 mg/dL — AB (ref 6–20)
CHLORIDE: 103 mmol/L (ref 101–111)
CO2: 24 mmol/L (ref 22–32)
CREATININE: 4.18 mg/dL — AB (ref 0.61–1.24)
Calcium: 7.9 mg/dL — ABNORMAL LOW (ref 8.9–10.3)
GFR calc Af Amer: 17 mL/min — ABNORMAL LOW (ref >60)
GFR calc non Af Amer: 15 mL/min — ABNORMAL LOW (ref >60)
Glucose, Bld: 142 mg/dL — ABNORMAL HIGH (ref 65–99)
POTASSIUM: 3.7 mmol/L (ref 3.5–5.1)
Phosphorus: 2.5 mg/dL (ref 2.5–4.6)
Sodium: 136 mmol/L (ref 135–145)

## 2017-04-15 LAB — BASIC METABOLIC PANEL
Anion gap: 10 (ref 5–15)
BUN: 53 mg/dL — AB (ref 6–20)
CHLORIDE: 102 mmol/L (ref 101–111)
CO2: 23 mmol/L (ref 22–32)
CREATININE: 5.22 mg/dL — AB (ref 0.61–1.24)
Calcium: 8 mg/dL — ABNORMAL LOW (ref 8.9–10.3)
GFR calc Af Amer: 13 mL/min — ABNORMAL LOW (ref >60)
GFR calc non Af Amer: 11 mL/min — ABNORMAL LOW (ref >60)
Glucose, Bld: 205 mg/dL — ABNORMAL HIGH (ref 65–99)
Potassium: 3.5 mmol/L (ref 3.5–5.1)
SODIUM: 135 mmol/L (ref 135–145)

## 2017-04-15 LAB — BLOOD GAS, ARTERIAL
Acid-base deficit: 2.4 mmol/L — ABNORMAL HIGH (ref 0.0–2.0)
Bicarbonate: 22.8 mmol/L (ref 20.0–28.0)
Drawn by: 51133
FIO2: 40
O2 Saturation: 97.2 %
PATIENT TEMPERATURE: 98.6
PCO2 ART: 45.8 mmHg (ref 32.0–48.0)
PEEP: 5 cmH2O
PO2 ART: 105 mmHg (ref 83.0–108.0)
RATE: 21 resp/min
VT: 450 mL
pH, Arterial: 7.318 — ABNORMAL LOW (ref 7.350–7.450)

## 2017-04-15 LAB — PHOSPHORUS: Phosphorus: 3.1 mg/dL (ref 2.5–4.6)

## 2017-04-15 LAB — GLUCOSE, CAPILLARY
GLUCOSE-CAPILLARY: 155 mg/dL — AB (ref 65–99)
GLUCOSE-CAPILLARY: 147 mg/dL — AB (ref 65–99)
GLUCOSE-CAPILLARY: 120 mg/dL — AB (ref 65–99)
GLUCOSE-CAPILLARY: 121 mg/dL — AB (ref 65–99)

## 2017-04-15 LAB — MAGNESIUM: MAGNESIUM: 1.9 mg/dL (ref 1.7–2.4)

## 2017-04-15 MED ORDER — SODIUM CHLORIDE 0.9 % IV SOLN
3.0000 g | Freq: Three times a day (TID) | INTRAVENOUS | Status: DC
  Administered 2017-04-15 – 2017-04-17 (×5): 3 g via INTRAVENOUS
  Filled 2017-04-15 (×6): qty 3

## 2017-04-15 MED ORDER — PRISMASOL BGK 4/2.5 32-4-2.5 MEQ/L IV SOLN
INTRAVENOUS | Status: DC
  Administered 2017-04-15 – 2017-04-17 (×3): via INTRAVENOUS_CENTRAL
  Filled 2017-04-15 (×5): qty 5000

## 2017-04-15 MED ORDER — CLONAZEPAM 1 MG PO TABS
2.0000 mg | ORAL_TABLET | Freq: Two times a day (BID) | ORAL | Status: DC
  Administered 2017-04-15: 2 mg via ORAL
  Filled 2017-04-15: qty 2

## 2017-04-15 MED ORDER — POTASSIUM CHLORIDE 10 MEQ/50ML IV SOLN
10.0000 meq | INTRAVENOUS | Status: AC
  Administered 2017-04-15 (×3): 10 meq via INTRAVENOUS
  Filled 2017-04-15 (×4): qty 50

## 2017-04-15 MED ORDER — PRISMASOL BGK 4/2.5 32-4-2.5 MEQ/L IV SOLN
INTRAVENOUS | Status: DC
  Administered 2017-04-15 – 2017-04-16 (×2): via INTRAVENOUS_CENTRAL
  Filled 2017-04-15 (×3): qty 5000

## 2017-04-15 MED ORDER — PRISMASOL BGK 4/2.5 32-4-2.5 MEQ/L IV SOLN
INTRAVENOUS | Status: DC
  Filled 2017-04-15: qty 5000

## 2017-04-15 MED ORDER — SODIUM CHLORIDE 0.9 % FOR CRRT
INTRAVENOUS_CENTRAL | Status: DC | PRN
  Filled 2017-04-15: qty 1000

## 2017-04-15 MED ORDER — INSULIN ASPART 100 UNIT/ML ~~LOC~~ SOLN
0.0000 [IU] | SUBCUTANEOUS | Status: DC
  Administered 2017-04-15 (×2): 2 [IU] via SUBCUTANEOUS
  Administered 2017-04-15: 3 [IU] via SUBCUTANEOUS
  Administered 2017-04-16 (×3): 2 [IU] via SUBCUTANEOUS

## 2017-04-15 MED ORDER — SCOPOLAMINE 1 MG/3DAYS TD PT72
1.0000 | MEDICATED_PATCH | TRANSDERMAL | Status: AC
  Administered 2017-04-15: 1.5 mg via TRANSDERMAL
  Filled 2017-04-15: qty 1

## 2017-04-15 MED ORDER — PRISMASOL BGK 4/2.5 32-4-2.5 MEQ/L IV SOLN
INTRAVENOUS | Status: DC
  Administered 2017-04-15 – 2017-04-17 (×14): via INTRAVENOUS_CENTRAL
  Filled 2017-04-15 (×18): qty 5000

## 2017-04-15 MED ORDER — HEPARIN 1000 UNIT/ML FOR PERITONEAL DIALYSIS
3000.0000 [IU] | INTRAMUSCULAR | Status: DC | PRN
  Filled 2017-04-15 (×3): qty 3

## 2017-04-15 MED ORDER — HEPARIN SODIUM (PORCINE) 1000 UNIT/ML DIALYSIS
1000.0000 [IU] | INTRAMUSCULAR | Status: DC | PRN
  Administered 2017-04-15: 2400 [IU] via INTRAVENOUS_CENTRAL

## 2017-04-15 MED ORDER — TRACE MINERALS CR-CU-MN-SE-ZN 10-1000-500-60 MCG/ML IV SOLN
INTRAVENOUS | Status: AC
  Administered 2017-04-15: 17:00:00 via INTRAVENOUS
  Filled 2017-04-15: qty 640

## 2017-04-15 MED ORDER — SODIUM CHLORIDE 0.9 % IV SOLN
1.5000 g | Freq: Two times a day (BID) | INTRAVENOUS | Status: DC
  Administered 2017-04-15: 1.5 g via INTRAVENOUS
  Filled 2017-04-15 (×2): qty 1.5

## 2017-04-15 NOTE — Progress Notes (Signed)
PHARMACY - ADULT TOTAL PARENTERAL NUTRITION CONSULT NOTE   Pharmacy Consult:  TPN Indication:  Prolonged ileus, possible SBO  Patient Measurements: Height: 5\' 11"  (180.3 cm) Weight: 164 lb 7.4 oz (74.6 kg) IBW/kg (Calculated) : 75.3   Body mass index is 22.94 kg/m. *Using TBW on admit - 68 kg  Assessment:  21 YOM with PMH notable for heavy ETOH use and smoking admitted to [facility] on 04/02/17 with abdominal pain and fever.  Found to have diverticular abscess (IR catheter) and course complicated by DTs, possible SBO, PNA progressing into respiratory failure requiring intubation on 04/09/17 and pressors for hemodynamic instability.  Patient was transferred to [facility] on 04/11/17 for ARDS and progressive renal insufficiency.   GI: BL prealbumin <5. NGT and rectal tube outputs not documented. Last BM 10/30, ileus resolved? Worrisome for possible fistula. Albumin low at 1.3. Prealbumin less than 5. PPI IV Endo: No hx DM. CBGs remain controlled but trending up to 110-160s. Insulin requirements in the past 24 hours: 6 units of SSI Lytes: Na/Cl have slowly trended up to wnl. K+ remains low at 3.5 despite replacement and increase in K in TPN(goal >/= 4 for ileus), Mag 1.9 (goal >/= 2 for ileus). Phos wnl. CoCa 10.1. Renal: AKI - SCr continues to rise to 5.22, BUN up to 53, UOP low but up a little to 0.3 ml/kg/hr, net +4.9 L since admit. Will shoot for lower fluid goal since net positive so far on admit. Lasix IV TID Pulm: respiratory acidosis, FiO2 40% - Duonebs Cards: VSS - off Levophed gtt Hepatobil: AST/ALT 44/16, AlkPhos down to 188, tbili WNL. TG elevated at 203. Neuro: Precedex gtt, PRN Versed.  GCS 11, CPOT 0-2, RASS -1 (goal -1) ID: Zosyn for IAI/HCAP. Has diverticular abscess now s/p IR drainage. Afebrile, WBC 11.4  Best Practices: PPI IV, Hep [place] TPN Access: triple lumen CVC placed 04/11/17 TPN start date: 04/09/17 (at [place], last received lipids on 10/26)   Nutritional  Goals (RD rec from 10/29): KCal: 9476  Potein: 105-120 gm per day Fluid: 1.8 L (remains on Precedex ~654mL yesterday)   Current Nutrition:  NPO Customized TPN  Plan:  Concentrate customized TPN today (increasing protein, decreasing dextrose, and adding lipids) and continue at 50 ml/hr This TPN provides 96 g of protein, 240 g of dextrose, and 36 g of lipids which gives a total of 1,560 kcals per day meeting ~90% of nutritional needs Lytes adjusted in TPN: increased K further, increased Mg, changed Cl:Ac to 1:1 Add thiamine 100mg  and folic acid 1mg  to TPN Add MVI and trace elements to TPN Continue NS at 6ml/hr Increase to moderate SSI Q4h and adjust as needed Monitor TPN labs, BUN  Give KCl 30mEq IV x 3 runs this morning again   Elenor Quinones, PharmD, [facility] Clinical Pharmacist Pager [phone] 04/15/2017 7:26 AM

## 2017-04-15 NOTE — Progress Notes (Signed)
PULMONARY / CRITICAL CARE MEDICINE   Name: Dominic Phillips MRN: [id number] DOB: December 21, 1960    ADMISSION DATE:  04/11/2017 CONSULTATION DATE:  04/11/2017  REFERRING MD:  Dominic Phillips  CHIEF COMPLAINT:  ARDS secondary to aspiration pneumonia complicating a diverticular abscess  BRIEF  56 year male with etoh history in withdrawal with a liver abscess and SBO on TPN.   Transferred from [facility] for management of ARDS and progressive renal insufficiency. This gentleman has a long history of heavy ETOH abuse (~ 18 beers per day) and presented to [place] on 04/02/2017 with a c/o of abdominal pain without fevers, leukocytosis or diarrhea. An abd CT revealed pericolonic inflammation/fluid collection, for which he was admitted for diverticular abscess. Was consulted by Gen Surg who suggested placement of a catheter by interventional radiology; subsequent cultures grew E. Coli, for which Zosyn was initiated. Hospital course was complicated by the development of DTs, treated with 1 beer tid and Ativan prn. He slowly improved but worsened on 10/23 with increased WBC and progressive hypoxemia. Repeat abdominal CT showed improving abscess but developing SBO and multilobar pneumonia. Hypoxemia could not be corrected with BiPAP, along with worsening renal function such that he was intubated on 10/25 and required pressors for hemodynamic support. CT chest/abdomen/pelvis on 10/26 showed bilateral pneumonia with volume overload; no change in the RLQ percutaneous drain catheter. With rising creat to 3.1 and 290 cc U/O over 24 hours, transfer was requested.   EVENTS 10/18 - Admit Dominic Phillips  04/11/2017 - Admit Dominic Phillips (at [facility] had Left IJ CVL, RLQ drain and Art line)  SUBJECTIVE/OVERNIGHT/INTERVAL HX Remained afebrile with no acute events.  Sedation: Precedex gtt at 1.6 with intermittent versed given every 2-3 hours for agitation throughout the night.  He is off Levophed with stable blood pressures of  systolics in low 616W.  VITAL SIGNS: BP 112/76   Pulse 75   Temp 98.1 F (36.7 C) (Oral)   Resp (!) 29   Ht 5\' 11"  (1.803 m)   Wt 164 lb 7.4 oz (74.6 kg)   SpO2 100%   BMI 22.94 kg/m   HEMODYNAMICS:  VENTILATOR SETTINGS: Vent Mode: PRVC FiO2 (%):  [40 %] 40 % Set Rate:  [21 bmp] 21 bmp Vt Set:  [450 mL] 450 mL PEEP:  [5 cmH20] 5 cmH20 Pressure Support:  [10 cmH20] 10 cmH20 Plateau Pressure:  [18 cmH20-21 cmH20] 18 cmH20  INTAKE / OUTPUT: I/O last 3 completed shifts: In: 2496.5 [I.V.:2296.5; IV Piggyback:200] Out: 690 [Urine:690]  UOP: 540 cc  PHYSICAL EXAMINATION:  General Appearance:    56 yo male, NAD   Head:    NCAT, ETT in place   Eyes:    PERRL        Nose:   NG tube - yes  Throat:  ETT TUBE in place   Neck:   Supple,  No enlargement/tenderness/nodules     Lungs:     Coarse, Barrel chest, Synchronous with vent   Chest wall:    No deformity  Heart:    S1 and S2 normal, no MRG   Abdomen:     Mildly distended, no masses, no organomegaly, +BS         Extremities:   Extremities- cachectic      Skin:   Intact in exposed areas     Neurologic:   Sedation - precedex -> RASS - -3   LABS:  LA 0.9  Trop <0.03  Pre-alb <5  ABG - pH 7.3, pCO2 48.1, bicarb  24.9  Fibrinogen >800  plt 298  INR 15.5 high  APTT 35 wnl Iron 16 low TIBC low 123 Urine Na 60 Phos 3.1 wnl Mag 1.9 wnl   PULMONARY  Recent Labs Lab 04/11/17 1703 04/12/17 0227 04/12/17 0852 04/13/17 0218 04/14/17 0425 04/15/17 0415  PHART 7.304* 7.344* 7.337* 7.321* 7.330* 7.318*  PCO2ART 55.0* 48.4* 47.3 48.1* 46.9 45.8  PO2ART 103.0 108.0 106.0 179.0* 107 105  HCO3 27.3 26.3 25.3 24.9 24.0 22.8  TCO2 29 28 27 26   --   --   O2SAT 97.0 98.0 98.0 99.0 97.4 97.2   CBC  Recent Labs Lab 04/13/17 0520 04/13/17 0952 04/14/17 0333 04/15/17 0448  HGB 7.7* 7.9* 7.5* 7.7*  HCT 23.0* 23.7* 22.5* 23.5*  WBC 11.6*  --  10.6* 11.4*  PLT 296 298 277 278   COAGULATION  Recent Labs Lab  04/13/17 0952  INR 1.24   CARDIAC  Recent Labs Lab 04/13/17 0520  TROPONINI <0.03   No results for input(s): PROBNP in the last 168 hours.  CHEMISTRY  Recent Labs Lab 04/12/17 0600 04/12/17 1843 04/13/17 0050 04/13/17 0520 04/14/17 0333 04/15/17 0448  NA 132* 133* 133* 133* 134* 135  K 3.7 3.5 3.6 3.6 3.4* 3.5  CL 95* 97* 96* 98* 100* 102  CO2 26 25 25 25 24 23   GLUCOSE 139* 126* 119* 113* 145* 205*  BUN 35* 37* 39* 41* 47* 53*  CREATININE 3.93* 4.19* 4.34* 4.56* 4.77* 5.22*  CALCIUM 7.9* 7.8* 7.8* 7.8* 7.9* 8.0*  MG 2.2  --   --  1.9 2.0 1.9  PHOS 2.0*  --   --  3.1 3.2 3.1   Estimated Creatinine Clearance: 17.1 mL/min (A) (by C-G formula based on SCr of 5.22 mg/dL (H)).  LIVER  Recent Labs Lab 04/12/17 0600 04/13/17 0520 04/13/17 0952  AST 44* 44*  --   ALT 12* 16*  --   ALKPHOS 201* 188*  --   BILITOT 0.7 0.7  --   PROT 5.8* 5.7*  --   ALBUMIN 1.5* 1.3*  --   INR  --   --  1.24   INFECTIOUS  Recent Labs Lab 04/13/17 0050  LATICACIDVEN 0.9   ENDOCRINE CBG (last 3)   Recent Labs  04/14/17 2016 04/15/17 0004 04/15/17 0546  GLUCAP 147* 169* 155*   IMAGING x48h  - image(s) personally visualized  -   highlighted in bold US Renal  Result Date: 04/14/2017 CLINICAL DATA:  Acute kidney injury. EXAM: RENAL / URINARY TRACT ULTRASOUND COMPLETE COMPARISON:  None. FINDINGS: Right Kidney: Length: 12.0 cm. Borderline increased echogenicity. No mass or hydronephrosis visualized. Left Kidney: Length: 11.8 cm. Borderline increased echogenicity. No mass or hydronephrosis visualized. Bladder: Decompressed by Foley catheter and not assessed. Bilateral pleural effusions and intra-abdominal ascites is incidentally noted. Right upper quadrant fluid appears complex. IMPRESSION: 1. No obstructive uropathy. Borderline increased renal echogenicity which can be seen with chronic medical renal disease. 2. Incidental note of intra-abdominal ascites and bilateral pleural  effusions. Right upper quadrant fluid is complex. Recommend correlation with any outside imaging if available. Electronically Signed   By: Dominic Phillips M.D.   On: 04/14/2017 00:07   Dg Chest Port 1 View  Result Date: 04/15/2017 CLINICAL DATA:  Hypoxia EXAM: PORTABLE CHEST 1 VIEW COMPARISON:  April 14, 2017 FINDINGS: Endotracheal tube tip is 4.0 cm above the carina. Nasogastric tube tip and side port are below the diaphragm in the stomach. Central catheter tip is in the left  innominate vein, stable. No pneumothorax. There is diffuse interstitial and patchy alveolar edema, stable. There are pleural effusions bilaterally, stable on the right and slightly larger on the left. There is a calcified granuloma in the left base. Heart size is within normal limits. Pulmonary vascularity is within normal limits. No adenopathy appreciable. No bone lesions. IMPRESSION: Tube and catheter positions as described without pneumothorax. Interstitial and alveolar pulmonary edema without cardiomegaly. Pleural effusions bilaterally. Suspect ARDS. Superimposed atypical infection cannot be excluded radiographically. Both entities may exist concurrently. Electronically Signed   By: Lowella Grip III M.D.   On: 04/15/2017 07:41   Dg Chest Port 1 View  Result Date: 04/14/2017 CLINICAL DATA:  History of the ET tube placement EXAM: PORTABLE CHEST 1 VIEW COMPARISON:  04/13/2017 FINDINGS: The ET tube tip is above the carina. Left IJ catheter tip is in the projection of the SVC. There is an enteric tube with tip below the field of view. There is diffuse pulmonary edema. Moderate size right pleural effusion. IMPRESSION: 1. ARDS pattern is unchanged when compared with the previous exam. Electronically Signed   By: Kerby Moors M.D.   On: 04/14/2017 08:23   Dg Chest Port 1 View  Result Date: 04/13/2017 CLINICAL DATA:  Cough, shortness of breath. EXAM: PORTABLE CHEST 1 VIEW COMPARISON:  Radiograph of April 12, 2017.  FINDINGS: The heart size and mediastinal contours are within normal limits. Endotracheal and nasogastric tubes are unchanged in position. Stable position of left internal jugular catheter is noted with distal tip in expected position of left brachiocephalic vein. No pneumothorax is noted. Diffuse reticular interstitial densities are noted throughout both lungs most consistent with edema or inflammation. Mild right pleural effusion is noted. The visualized skeletal structures are unremarkable. IMPRESSION: Stable support apparatus. Stable bilateral diffuse lung densities are noted concerning for edema or inflammation. Mild right pleural effusion. Electronically Signed   By: Marijo Conception, M.D.   On: 04/13/2017 08:52   IMAGING CXR 10.28 - Hazy reticulonodular opacities bilaterally, L>R, compatible with the given history of ARDS, possibly some component of pulmonary edema; bibasilar opacities suggesting small layering pleural effusions and/or atelectasis  ECHO 10/28 >> EF 55-60% , no regional wall abnormality  CXR 10/29 >>Stable bilateral diffuse lung densities are noted concerning for edema or inflammation. Mild right pleural effusion. Renal US 10/29>> no obstructive uropathy, borderline increased echogenicity which can be seen with chronic medical renal disease.  Intra-abdominal ascites present and bilateral pleural effusions.   CXR 10/30 >> ARDS pattern is unchanged when compared with the previous exam, diffuse pulmonary edema and moderate sized R pleural effusion  CXR 10/31>>   CULTURES: None   ANTIBIOTICS: Zosyn 10/27 >>   LINES/TUBES: ETT 10/25>>> L IJ 10/25>>> L radial 10/25>>> Liver drain 04/02/22>>> Foley   ASSESSMENT / PLAN:  PULMONARY Seems to have copd at baseline ARDS  Full vent support  Titrate O2 for sat of 88-92%   CARDIOVASCULAR Circulatory shock Tele CVP monitoring Pressures stable off Levophed  ECHO with EF 55-60% , no regional wall abnormality   RENAL AKI with  oliguria  Renal US 10/29 with no obstructive uropathy  S/p high dose Lasix 10/30  CRRT today with a lot of volume negative IVF @ KVO  Assess fluid status daily  Daily BMET  Replete lytes as needed   GASTROINTESTINAL Diverticular abscess with +/- SBO- s/p RLQ drain at Normal between 04/02/17 and 04/11/17 Per surgery >> continue NPO and TNA  IV Protonix  TPN per pharmacy  HEMATOLOGIC  Recent Labs Lab 04/13/17 0520 04/13/17 0952 04/14/17 0333 04/15/17 0448  HGB 7.7* 7.9* 7.5* 7.7*  HCT 23.0* 23.7* 22.5* 23.5*  WBC 11.6*  --  10.6* 11.4*  PLT 296 298 277 278   Anemia of critical illness Hgb stable 7.7 Monitor CBC  INFECTIOUS Diverticular abscess at [facility] - s/p drain placed by IR  Afebrile with WBC 11.4  Abx d/w ID given multiple species - recommend continue Zosyn  Monitor fever curve   ENDOCRINE No known prior hx  CBG's 150-170 ICU hyperglycemia protocol  NEUROLOGIC Head CT 10/26: No intracranial mass, hemorrhage or extra-axial fluid collection ETOH dependency with DT at [place] goal: -2 Continue Precedex gtt Versed PRN for agitation  Continue Clonidine 0.2 mg TID Clonazepam 1 mg BID Will sedation wean as able   FAMILY  - Updates:   No family at bedside this morning - will update later today.  Case manager called regarding complex social situation at home and support provided.   Lovenia Kim, MD [place] PGY-2   Attending Note:  56 year old male alcoholic with liver abscess who remains in respiratory failure and renal failure.  On exam, awake and arousable but not weaning weaning well.  I reviewed CXR myself, pulmonary edema noted.  Discussed with Dr. Hollie Salk, will begin CRRT today after placement of an HD catheter.  Spoke with family, if not extubatable by Friday will proceed with trach.  Hold of PEG given ascites.  Surgery following, appreciate input.  Continue abx for now.  The patient is critically ill with multiple organ systems failure and  requires high complexity decision making for assessment and support, frequent evaluation and titration of therapies, application of advanced monitoring technologies and extensive interpretation of multiple databases.   Critical Care Time devoted to patient care services described in this note is  35  Minutes. This time reflects time of care of this signee Dr Jennet Maduro. This critical care time does not reflect procedure time, or teaching time or supervisory time of PA/NP/Med student/Med Resident etc but could involve care discussion time.  Rush Farmer, M.D. [facility] Pulmonary/Critical Care Medicine. Pager: [phone]. After hours pager: [phone].

## 2017-04-15 NOTE — Progress Notes (Signed)
Vent switched out due to a "technical error 27". Pt stable throughout with no complications. VS within normal limits. Pt taken off ventilator and manually ventilated throughout switch. RT will continue to monitor.

## 2017-04-15 NOTE — Progress Notes (Signed)
S: Dominic Phillips is intubated with sedation, his sister and brother and law are in the room and updated on the plan  O:BP 121/76   Pulse 82   Temp 97.7 F (36.5 C) (Axillary)   Resp (!) 22   Ht '5\' 11"'$  (1.803 m)   Wt 164 lb 7.4 oz (74.6 kg)   SpO2 93%   BMI 22.94 kg/m   Intake/Output Summary (Last 24 hours) at 04/15/17 1603 Last data filed at 04/15/17 1600  Gross per 24 hour  Intake          2333.25 ml  Output              865 ml  Net          1468.25 ml   Intake/Output: I/O last 3 completed shifts: In: 2669.9 [I.V.:2469.9; IV Piggyback:200] Out: 690 [Urine:690]  Intake/Output this shift:  Total I/O In: 1090.3 [I.V.:770.3; NG/GT:120; IV Piggyback:200] Out: 515 [Urine:360; Other:155] Weight change: 1 lb 12.2 oz (0.8 kg) Gen: no acute distress, sedated  HEENT: NG tube, L neck catheter  CVS: RRR, no murmur appreciated  Resp: coarse breath sounds, ETT  AJO:INOMVEHMC, soft, non tender Ext: no peripheral edema    Recent Labs Lab 04/11/17 1850 04/12/17 0600 04/12/17 1843 04/13/17 0050 04/13/17 0520 04/14/17 0333 04/15/17 0448  NA 133* 132* 133* 133* 133* 134* 135  K 3.6 3.7 3.5 3.6 3.6 3.4* 3.5  CL 97* 95* 97* 96* 98* 100* 102  CO2 '26 26 25 25 25 24 23  '$ GLUCOSE 143* 139* 126* 119* 113* 145* 205*  BUN 31* 35* 37* 39* 41* 47* 53*  CREATININE 3.61* 3.93* 4.19* 4.34* 4.56* 4.77* 5.22*  ALBUMIN  --  1.5*  --   --  1.3*  --   --   CALCIUM 7.6* 7.9* 7.8* 7.8* 7.8* 7.9* 8.0*  PHOS  --  2.0*  --   --  3.1 3.2 3.1  AST  --  44*  --   --  44*  --   --   ALT  --  12*  --   --  16*  --   --    Liver Function Tests:  Recent Labs Lab 04/12/17 0600 04/13/17 0520  AST 44* 44*  ALT 12* 16*  ALKPHOS 201* 188*  BILITOT 0.7 0.7  PROT 5.8* 5.7*  ALBUMIN 1.5* 1.3*   No results for input(s): LIPASE, AMYLASE in the last 168 hours. No results for input(s): AMMONIA in the last 168 hours. CBC:  Recent Labs Lab 04/11/17 1850 04/12/17 0600 04/13/17 0520 04/13/17 0952  04/14/17 0333 04/15/17 0448  WBC 14.2* 14.7* 11.6*  --  10.6* 11.4*  NEUTROABS 12.6* 13.1* 9.9*  --  8.7* 9.2*  HGB 7.9* 8.4* 7.7* 7.9* 7.5* 7.7*  HCT 24.0* 25.4* 23.0* 23.7* 22.5* 23.5*  MCV 96.4 94.4 94.3  --  93.8 94.0  PLT 261 257 296 298 277 278   Cardiac Enzymes:  Recent Labs Lab 04/13/17 0520  TROPONINI <0.03   CBG:  Recent Labs Lab 04/14/17 2016 04/15/17 0004 04/15/17 0546 04/15/17 1225 04/15/17 1535  GLUCAP 147* 169* 155* 147* 120*    Iron Studies:  Recent Labs  04/13/17 1639  IRON 16*  TIBC 123*   Studies/Results: US Renal  Result Date: 04/14/2017 CLINICAL DATA:  Acute kidney injury. EXAM: RENAL / URINARY TRACT ULTRASOUND COMPLETE COMPARISON:  None. FINDINGS: Right Kidney: Length: 12.0 cm. Borderline increased echogenicity. No mass or hydronephrosis visualized. Left Kidney: Length: 11.8 cm. Borderline  increased echogenicity. No mass or hydronephrosis visualized. Bladder: Decompressed by Foley catheter and not assessed. Bilateral pleural effusions and intra-abdominal ascites is incidentally noted. Right upper quadrant fluid appears complex. IMPRESSION: 1. No obstructive uropathy. Borderline increased renal echogenicity which can be seen with chronic medical renal disease. 2. Incidental note of intra-abdominal ascites and bilateral pleural effusions. Right upper quadrant fluid is complex. Recommend correlation with any outside imaging if available. Electronically Signed   By: Rubye Oaks M.D.   On: 04/14/2017 00:07   Dg Chest Port 1 View  Result Date: 04/15/2017 CLINICAL DATA:  Central line placement. EXAM: PORTABLE CHEST 1 VIEW COMPARISON:  04/15/2017 FINDINGS: Double lumen right IJ catheter tip is just below the carina with the distal port near the cavoatrial junction. The left IJ catheter is stable. The endotracheal tube is stable. The NG tube is stable. Persistent but slightly improved diffuse interstitial and airspace process. Persistent effusions and  atelectasis. IMPRESSION: Right IJ catheter in good position without complicating features. Other support apparatus is unchanged. Stable interstitial and airspace process in the lungs along with effusions and atelectasis. Electronically Signed   By: Rudie Meyer M.D.   On: 04/15/2017 12:13   Dg Chest Port 1 View  Result Date: 04/15/2017 CLINICAL DATA:  Hypoxia EXAM: PORTABLE CHEST 1 VIEW COMPARISON:  April 14, 2017 FINDINGS: Endotracheal tube tip is 4.0 cm above the carina. Nasogastric tube tip and side port are below the diaphragm in the stomach. Central catheter tip is in the left innominate vein, stable. No pneumothorax. There is diffuse interstitial and patchy alveolar edema, stable. There are pleural effusions bilaterally, stable on the right and slightly larger on the left. There is a calcified granuloma in the left base. Heart size is within normal limits. Pulmonary vascularity is within normal limits. No adenopathy appreciable. No bone lesions. IMPRESSION: Tube and catheter positions as described without pneumothorax. Interstitial and alveolar pulmonary edema without cardiomegaly. Pleural effusions bilaterally. Suspect ARDS. Superimposed atypical infection cannot be excluded radiographically. Both entities may exist concurrently. Electronically Signed   By: Bretta Bang III M.D.   On: 04/15/2017 07:41   Dg Chest Port 1 View  Result Date: 04/14/2017 CLINICAL DATA:  History of the ET tube placement EXAM: PORTABLE CHEST 1 VIEW COMPARISON:  04/13/2017 FINDINGS: The ET tube tip is above the carina. Left IJ catheter tip is in the projection of the SVC. There is an enteric tube with tip below the field of view. There is diffuse pulmonary edema. Moderate size right pleural effusion. IMPRESSION: 1. ARDS pattern is unchanged when compared with the previous exam. Electronically Signed   By: Signa Kell M.D.   On: 04/14/2017 08:23   . chlorhexidine gluconate (MEDLINE KIT)  15 mL Mouth Rinse BID  .  Chlorhexidine Gluconate Cloth  6 each Topical Daily  . clonazePAM  2 mg Oral BID  . cloNIDine  0.2 mg Per Tube TID  . furosemide  80 mg Intravenous TID  . heparin subcutaneous  5,000 Units Subcutaneous Q8H  . insulin aspart  0-15 Units Subcutaneous Q4H  . ipratropium-albuterol  3 mL Nebulization Q6H  . mouth rinse  15 mL Mouth Rinse 10 times per day  . pantoprazole (PROTONIX) IV  40 mg Intravenous QHS  . scopolamine  1 patch Transdermal Q72H  . sodium chloride flush  10-40 mL Intracatheter Q12H    BMET    Component Value Date/Time   NA 135 04/15/2017 0448   K 3.5 04/15/2017 0448   CL 102  04/15/2017 0448   CO2 23 04/15/2017 0448   GLUCOSE 205 (H) 04/15/2017 0448   BUN 53 (H) 04/15/2017 0448   CREATININE 5.22 (H) 04/15/2017 0448   CALCIUM 8.0 (L) 04/15/2017 0448   GFRNONAA 11 (L) 04/15/2017 0448   GFRAA 13 (L) 04/15/2017 0448   CBC    Component Value Date/Time   WBC 11.4 (H) 04/15/2017 0448   RBC 2.50 (L) 04/15/2017 0448   HGB 7.7 (L) 04/15/2017 0448   HCT 23.5 (L) 04/15/2017 0448   PLT 278 04/15/2017 0448   MCV 94.0 04/15/2017 0448   MCH 30.8 04/15/2017 0448   MCHC 32.8 04/15/2017 0448   RDW 17.4 (H) 04/15/2017 0448   LYMPHSABS 0.8 04/15/2017 0448   MONOABS 1.1 (H) 04/15/2017 0448   EOSABS 0.2 04/15/2017 0448   BASOSABS 0.0 04/15/2017 0448     Assessment/Plan:  1. AKI (unknown b/l) - Crt continues to worsen, Crt not responding to lasix as we had hoped. Will initiate CRRT today as this is the will allow for the most optimal fluid removal. He is tolerating CRRT with good bp and 260 cc/hr rate.  2. Hypokalemia, resolved  3. Anemia of chronic disease - initiated po iron, can give IV when he is off abx 4. Mineral metabolic disorder - calcium wnl when corrected for low albumin, phos WNL  5. VDRF - PCCM is managing, hopeful that SBT improves with fluid removal  1. Cardiac shock - stable, not requiring pressors  2. Diverticular abscess with ?SBO s/p drain placed by IR,  on unasyn   Ledell Noss

## 2017-04-15 NOTE — Progress Notes (Signed)
eLink Physician-Brief Progress Note Patient Name: Dominic Phillips DOB: [birth year] MRN: [id number]   Date of Service  04/15/2017  HPI/Events of Note  Currently on ventilator with endotracheal intubation. Patient nothing by mouth per surgery. Has Klonopin and clonidine ordered via tube scheduled. Also Lasix IV still ordered scheduled. Currently on CRRT for acute renal failure with oliguria.   eICU Interventions  1. Discontinuing Lasix, clonidine, and Klonopin. 2. Continuing Versed IV as needed 3. Continuing Precedex infusion 4. Monitoring vitals per unit protocol 5. Renal replacement therapy as per nephrology      Intervention Category Major Interventions: Acute renal failure - evaluation and management  Tera Partridge 04/15/2017, 9:24 PM

## 2017-04-15 NOTE — Progress Notes (Signed)
Subjective/Chief Complaint: Remains of the vent   Objective: Vital signs in last 24 hours: Temp:  [97.9 F (36.6 C)-98.6 F (37 C)] 98.1 F (36.7 C) (10/31 0720) Pulse Rate:  [70-131] 81 (10/31 0800) Resp:  [20-40] 40 (10/31 0800) BP: (97-142)/(67-83) 103/70 (10/31 0800) SpO2:  [90 %-100 %] 99 % (10/31 0800) FiO2 (%):  [40 %] 40 % (10/31 0800) Weight:  [74.6 kg (164 lb 7.4 oz)] 74.6 kg (164 lb 7.4 oz) (10/31 0438) Last BM Date: 04/14/17  Intake/Output from previous day: 10/30 0701 - 10/31 0700 In: 1849.9 [I.V.:1749.9; IV Piggyback:100] Out: 540 [Urine:540] Intake/Output this shift: Total I/O In: 87.3 [I.V.:87.3] Out: 125 [Urine:125]  Exam: Abdomen with anasarca, tender in LLQ, questionable erythema of the abdominal wall. Drain with stool and purulence  Lab Results:   Recent Labs  04/14/17 0333 04/15/17 0448  WBC 10.6* 11.4*  HGB 7.5* 7.7*  HCT 22.5* 23.5*  PLT 277 278   BMET  Recent Labs  04/14/17 0333 04/15/17 0448  NA 134* 135  K 3.4* 3.5  CL 100* 102  CO2 24 23  GLUCOSE 145* 205*  BUN 47* 53*  CREATININE 4.77* 5.22*  CALCIUM 7.9* 8.0*   PT/INR  Recent Labs  04/13/17 0952  LABPROT 15.5*  INR 1.24   ABG  Recent Labs  04/14/17 0425 04/15/17 0415  PHART 7.330* 7.318*  HCO3 24.0 22.8    Studies/Results: US Renal  Result Date: 04/14/2017 CLINICAL DATA:  Acute kidney injury. EXAM: RENAL / URINARY TRACT ULTRASOUND COMPLETE COMPARISON:  None. FINDINGS: Right Kidney: Length: 12.0 cm. Borderline increased echogenicity. No mass or hydronephrosis visualized. Left Kidney: Length: 11.8 cm. Borderline increased echogenicity. No mass or hydronephrosis visualized. Bladder: Decompressed by Foley catheter and not assessed. Bilateral pleural effusions and intra-abdominal ascites is incidentally noted. Right upper quadrant fluid appears complex. IMPRESSION: 1. No obstructive uropathy. Borderline increased renal echogenicity which can be seen with  chronic medical renal disease. 2. Incidental note of intra-abdominal ascites and bilateral pleural effusions. Right upper quadrant fluid is complex. Recommend correlation with any outside imaging if available. Electronically Signed   By: Jeb Levering M.D.   On: 04/14/2017 00:07   Dg Chest Port 1 View  Result Date: 04/15/2017 CLINICAL DATA:  Hypoxia EXAM: PORTABLE CHEST 1 VIEW COMPARISON:  April 14, 2017 FINDINGS: Endotracheal tube tip is 4.0 cm above the carina. Nasogastric tube tip and side port are below the diaphragm in the stomach. Central catheter tip is in the left innominate vein, stable. No pneumothorax. There is diffuse interstitial and patchy alveolar edema, stable. There are pleural effusions bilaterally, stable on the right and slightly larger on the left. There is a calcified granuloma in the left base. Heart size is within normal limits. Pulmonary vascularity is within normal limits. No adenopathy appreciable. No bone lesions. IMPRESSION: Tube and catheter positions as described without pneumothorax. Interstitial and alveolar pulmonary edema without cardiomegaly. Pleural effusions bilaterally. Suspect ARDS. Superimposed atypical infection cannot be excluded radiographically. Both entities may exist concurrently. Electronically Signed   By: Lowella Grip III M.D.   On: 04/15/2017 07:41   Dg Chest Port 1 View  Result Date: 04/14/2017 CLINICAL DATA:  History of the ET tube placement EXAM: PORTABLE CHEST 1 VIEW COMPARISON:  04/13/2017 FINDINGS: The ET tube tip is above the carina. Left IJ catheter tip is in the projection of the SVC. There is an enteric tube with tip below the field of view. There is diffuse pulmonary edema. Moderate size right  pleural effusion. IMPRESSION: 1. ARDS pattern is unchanged when compared with the previous exam. Electronically Signed   By: Kerby Moors M.D.   On: 04/14/2017 08:23    Anti-infectives: Anti-infectives    Start     Dose/Rate Route  Frequency Ordered Stop   04/13/17 2200  levofloxacin (LEVAQUIN) IVPB 500 mg  Status:  Discontinued     500 mg 100 mL/hr over 60 Minutes Intravenous Every 48 hours 04/12/17 0114 04/12/17 0846   04/13/17 1800  piperacillin-tazobactam (ZOSYN) IVPB 2.25 g     2.25 g 100 mL/hr over 30 Minutes Intravenous Every 8 hours 04/13/17 1050     04/12/17 0200  piperacillin-tazobactam (ZOSYN) IVPB 3.375 g  Status:  Discontinued     3.375 g 12.5 mL/hr over 240 Minutes Intravenous Every 8 hours 04/12/17 0103 04/13/17 1050   04/12/17 0000  levofloxacin (LEVAQUIN) IVPB 750 mg  Status:  Discontinued     750 mg 100 mL/hr over 90 Minutes Intravenous Every 48 hours 04/11/17 2230 04/12/17 0114      Assessment/Plan:  Diverticulitis with abscess and IR placed drain, probable fistula, pneumonia, ARDS Remains critically ill. Will continue conservative management.  If worsens, will repeat abdominal CT.  Still may require resection and colostomy    LOS: 4 days    Jet Traynham A 04/15/2017

## 2017-04-15 NOTE — Procedures (Signed)
Hemodialysis Catheter Insertion Procedure Note Dominic Phillips [id number] 08/06/1960  Procedure: Insertion of Hemodialysis Catheter Indications: CRRT  Procedure Details Consent: Risks of procedure as well as the alternatives and risks of each were explained to the (patient/caregiver).  Consent for procedure obtained. Time Out: Verified patient identification, verified procedure, site/side was marked, verified correct patient position, special equipment/implants available, medications/allergies/relevent history reviewed, required imaging and test results available.  Performed  Maximum sterile technique was used including antiseptics, cap, gloves, gown, hand hygiene, mask and sheet. Skin prep: Chlorhexidine; local anesthetic administered A antimicrobial bonded/coated triple lumen catheter was placed in the right internal jugular vein using the Seldinger technique.  Evaluation Blood flow good Complications: No apparent complications Patient did tolerate procedure well. Chest X-ray ordered to verify placement.  CXR: pending.  Procedure performed under direct ultrasound guidance for real time vessel cannulation.      Montey Hora, [facility] Pgr: [phone]  or [phone] 04/15/2017, 11:37 AM  I was present for procedure.  Rush Farmer, M.D. [facility] Pulmonary/Critical Care Medicine. Pager: [phone]. After hours pager: [phone].

## 2017-04-15 NOTE — Progress Notes (Signed)
Pharmacy Antibiotic Note  Dominic Phillips is a 55 y.o. male transferred from [facility] on 04/11/2017 with diverticular abcess s/p drainage, VDRF and ARF.  Fluid from diverticular abcess grew pan-sensitive E. Coli, Finegoldia magna (formerly peptostreptococcus), Staph saccharolyticus, and Prevotella loescheii.    Renal function is worsening - Scr 5.22, CrCl ~15 ml/min. WBC remains elevated at 11.4, pt is afebrile. Pharmacy consulted to dose Unasyn and discontinue Zosyn. Patient received Unasyn 1.5g x 1 today, but has now started CRRT so dose needs adjustment.   Plan: Change to Unasyn 3gm IV Q8H while on CRRT Follow-up on LOT and clinical progression  Height: 5\' 11"  (180.3 cm) Weight: 164 lb 7.4 oz (74.6 kg) IBW/kg (Calculated) : 75.3  Temp (24hrs), Avg:98.2 F (36.8 C), Min:97.9 F (36.6 C), Max:98.9 F (37.2 C)   Recent Labs Lab 04/11/17 1850 04/12/17 0600 04/12/17 1843 04/13/17 0050 04/13/17 0520 04/14/17 0333 04/15/17 0448  WBC 14.2* 14.7*  --   --  11.6* 10.6* 11.4*  CREATININE 3.61* 3.93* 4.19* 4.34* 4.56* 4.77* 5.22*  LATICACIDVEN  --   --   --  0.9  --   --   --     Estimated Creatinine Clearance: 17.1 mL/min (A) (by C-G formula based on SCr of 5.22 mg/dL (H)).    No Known Allergies   Zosyn 10/28 >> 10/31 Unasyn 10/31 >>  10/27 MRSA PCR - negative  Leroy Libman, PharmD Pharmacy Resident Pager: [phone]

## 2017-04-16 DIAGNOSIS — R6521 Severe sepsis with septic shock: Secondary | ICD-10-CM

## 2017-04-16 DIAGNOSIS — A419 Sepsis, unspecified organism: Principal | ICD-10-CM

## 2017-04-16 LAB — COMPREHENSIVE METABOLIC PANEL
ALK PHOS: 92 U/L (ref 38–126)
ALT: 11 U/L — ABNORMAL LOW (ref 17–63)
AST: 20 U/L (ref 15–41)
Albumin: 1.4 g/dL — ABNORMAL LOW (ref 3.5–5.0)
Anion gap: 8 (ref 5–15)
BILIRUBIN TOTAL: 0.4 mg/dL (ref 0.3–1.2)
BUN: 37 mg/dL — AB (ref 6–20)
CALCIUM: 7.9 mg/dL — AB (ref 8.9–10.3)
CO2: 24 mmol/L (ref 22–32)
Chloride: 105 mmol/L (ref 101–111)
Creatinine, Ser: 3.36 mg/dL — ABNORMAL HIGH (ref 0.61–1.24)
GFR calc Af Amer: 22 mL/min — ABNORMAL LOW (ref >60)
GFR, EST NON AFRICAN AMERICAN: 19 mL/min — AB (ref >60)
Glucose, Bld: 155 mg/dL — ABNORMAL HIGH (ref 65–99)
POTASSIUM: 3.9 mmol/L (ref 3.5–5.1)
Sodium: 137 mmol/L (ref 135–145)
TOTAL PROTEIN: 5.5 g/dL — AB (ref 6.5–8.1)

## 2017-04-16 LAB — GLUCOSE, CAPILLARY
GLUCOSE-CAPILLARY: 140 mg/dL — AB (ref 65–99)
Glucose-Capillary: 109 mg/dL — ABNORMAL HIGH (ref 65–99)
Glucose-Capillary: 133 mg/dL — ABNORMAL HIGH (ref 65–99)
Glucose-Capillary: 116 mg/dL — ABNORMAL HIGH (ref 65–99)
Glucose-Capillary: 134 mg/dL — ABNORMAL HIGH (ref 65–99)
Glucose-Capillary: 84 mg/dL (ref 65–99)
Glucose-Capillary: 105 mg/dL — ABNORMAL HIGH (ref 65–99)

## 2017-04-16 LAB — CBC WITH DIFFERENTIAL/PLATELET
BASOS ABS: 0 10*3/uL (ref 0.0–0.1)
BASOS PCT: 0 %
Eosinophils Absolute: 0.1 10*3/uL (ref 0.0–0.7)
Eosinophils Relative: 1 %
HEMATOCRIT: 24.5 % — AB (ref 39.0–52.0)
HEMOGLOBIN: 8.2 g/dL — AB (ref 13.0–17.0)
LYMPHS ABS: 0.8 10*3/uL (ref 0.7–4.0)
Lymphocytes Relative: 6 %
MCH: 31.4 pg (ref 26.0–34.0)
MCHC: 33.5 g/dL (ref 30.0–36.0)
MCV: 93.9 fL (ref 78.0–100.0)
MONOS PCT: 7 %
Monocytes Absolute: 1 10*3/uL (ref 0.1–1.0)
NEUTROS ABS: 12.1 10*3/uL — AB (ref 1.7–7.7)
Neutrophils Relative %: 86 %
Platelets: 233 10*3/uL (ref 150–400)
RBC: 2.61 MIL/uL — ABNORMAL LOW (ref 4.22–5.81)
RDW: 17.7 % — ABNORMAL HIGH (ref 11.5–15.5)
WBC: 14 10*3/uL — ABNORMAL HIGH (ref 4.0–10.5)

## 2017-04-16 LAB — PHOSPHORUS: PHOSPHORUS: 2.1 mg/dL — AB (ref 2.5–4.6)

## 2017-04-16 LAB — RENAL FUNCTION PANEL
ANION GAP: 7 (ref 5–15)
Albumin: 1.4 g/dL — ABNORMAL LOW (ref 3.5–5.0)
BUN: 28 mg/dL — ABNORMAL HIGH (ref 6–20)
CHLORIDE: 105 mmol/L (ref 101–111)
CO2: 26 mmol/L (ref 22–32)
Calcium: 7.8 mg/dL — ABNORMAL LOW (ref 8.9–10.3)
Creatinine, Ser: 2.46 mg/dL — ABNORMAL HIGH (ref 0.61–1.24)
GFR calc Af Amer: 33 mL/min — ABNORMAL LOW (ref >60)
GFR calc non Af Amer: 28 mL/min — ABNORMAL LOW (ref >60)
GLUCOSE: 155 mg/dL — AB (ref 65–99)
POTASSIUM: 4 mmol/L (ref 3.5–5.1)
Phosphorus: 1.7 mg/dL — ABNORMAL LOW (ref 2.5–4.6)
SODIUM: 138 mmol/L (ref 135–145)

## 2017-04-16 LAB — MAGNESIUM: Magnesium: 2 mg/dL (ref 1.7–2.4)

## 2017-04-16 MED ORDER — TRACE MINERALS CR-CU-MN-SE-ZN 10-1000-500-60 MCG/ML IV SOLN
INTRAVENOUS | Status: AC
  Filled 2017-04-16: qty 640

## 2017-04-16 MED ORDER — VITAL HIGH PROTEIN PO LIQD: 1000.0000 mL | ORAL | Status: DC

## 2017-04-16 MED ORDER — NOREPINEPHRINE BITARTRATE 1 MG/ML IV SOLN
0.0000 ug/min | INTRAVENOUS | Status: DC
  Administered 2017-04-16: 3 ug/min via INTRAVENOUS
  Filled 2017-04-16: qty 16

## 2017-04-16 MED ORDER — SODIUM PHOSPHATES 45 MMOLE/15ML IV SOLN
10.0000 mmol | Freq: Once | INTRAVENOUS | Status: AC
  Administered 2017-04-16: 10 mmol via INTRAVENOUS
  Filled 2017-04-16: qty 3.33

## 2017-04-16 MED ORDER — VITAL AF 1.2 CAL PO LIQD
1000.0000 mL | ORAL | Status: DC
  Administered 2017-04-16: 1000 mL
  Filled 2017-04-16 (×5): qty 1000

## 2017-04-16 NOTE — Progress Notes (Signed)
CSW continues to follow for any discharge needs.     Virgie Dad Cayman Brogden, MSW, [facility] Clinical Social Worker [phone]

## 2017-04-16 NOTE — Progress Notes (Signed)
eLink Physician-Brief Progress Note Patient Name: Dominic Phillips DOB: 03/08/1961 MRN: [id number]   Date of Service  04/16/2017  HPI/Events of Note  jhigh risk self harm Requires reatrints  eICU Interventions       Intervention Category Minor Interventions: Routine modifications to care plan (e.g. PRN medications for pain, fever)  Raylene Miyamoto. 04/16/2017, 8:39 PM

## 2017-04-16 NOTE — Progress Notes (Signed)
S: CRRT ongoing.  Tolerating well.    O:BP 113/74   Pulse 83   Temp 97.6 F (36.4 C) (Axillary)   Resp (!) 24   Ht _0  (1.803 m)   Wt 73.2 kg (161 lb 6 oz)   SpO2 100%   BMI 22.51 kg/m   Intake/Output Summary (Last 24 hours) at 04/16/17 1230 Last data filed at 04/16/17 1200  Gross per 24 hour  Intake          2376.61 ml  Output             5659 ml  Net         -3282.39 ml   Intake/Output: I/O last 3 completed shifts: In: 3799.9 [I.V.:3129.9; NG/GT:170; IV MYTRZNBVA:701] Out: 4103 [UDTHY:3888; Emesis/NG output:200; LNZVJ:2820; Stool:100]  Intake/Output this shift:  Total I/O In: 379.6 [I.V.:379.6] Out: 1081 [Urine:68; Emesis/NG output:50; Other:963] Weight change: -1.4 kg (-3 lb 1.4 oz) Gen: no acute distress, intubated HEENT: NG tube, nontunneled HD cath in place CVS: RRR, no murmur appreciated  Resp: breath sounds improved  UOR:VIFBPPHKF, hypoactive bowel sounds Ext: no peripheral edema    Recent Labs Lab 04/12/17 0600 04/12/17 1843 04/13/17 0050 04/13/17 0520 04/14/17 0333 04/15/17 0448 04/15/17 1700 04/16/17 0411  NA 132* 133* 133* 133* 134* 135 136 137  K 3.7 3.5 3.6 3.6 3.4* 3.5 3.7 3.9  CL 95* 97* 96* 98* 100* 102 103 105  CO2 _1 GLUCOSE 139* 126* 119* 113* 145* 205* 142* 155*  BUN 35* 37* 39* 41* 47* 53* 46* 37*  CREATININE 3.93* 4.19* 4.34* 4.56* 4.77* 5.22* 4.18* 3.36*  ALBUMIN 1.5*  --   --  1.3*  --   --  1.4* 1.4*  CALCIUM 7.9* 7.8* 7.8* 7.8* 7.9* 8.0* 7.9* 7.9*  PHOS 2.0*  --   --  3.1 3.2 3.1 2.5 2.1*  AST 44*  --   --  44*  --   --   --  20  ALT 12*  --   --  16*  --   --   --  11*   Liver Function Tests:  Recent Labs Lab 04/12/17 0600 04/13/17 0520 04/15/17 1700 04/16/17 0411  AST 44* 44*  --  20  ALT 12* 16*  --  11*  ALKPHOS 201* 188*  --  92  BILITOT 0.7 0.7  --  0.4  PROT 5.8* 5.7*  --  5.5*  ALBUMIN 1.5* 1.3* 1.4* 1.4*   No results for input(s): LIPASE, AMYLASE in the last 168 hours. No results  for input(s): AMMONIA in the last 168 hours. CBC:  Recent Labs Lab 04/12/17 0600 04/13/17 0520  04/14/17 0333 04/15/17 0448 04/16/17 0411  WBC 14.7* 11.6*  --  10.6* 11.4* 14.0*  NEUTROABS 13.1* 9.9*  --  8.7* 9.2* 12.1*  HGB 8.4* 7.7*  < > 7.5* 7.7* 8.2*  HCT 25.4* 23.0*  < > 22.5* 23.5* 24.5*  MCV 94.4 94.3  --  93.8 94.0 93.9  PLT 257 296  < > 277 278 233  < > = values in this interval not displayed. Cardiac Enzymes:  Recent Labs Lab 04/13/17 0520  TROPONINI <0.03   CBG:  Recent Labs Lab 04/15/17 2001 04/16/17 0043 04/16/17 0425 04/16/17 0722 04/16/17 1207  GLUCAP 121* 109* 140* 133* 116*    Iron Studies:   Recent Labs  04/13/17 1639  IRON 16*  TIBC 123*   Studies/Results: Dg Chest Port 1 [address]  Result Date: 04/15/2017 CLINICAL DATA:  Central line placement. EXAM: PORTABLE CHEST 1 VIEW COMPARISON:  04/15/2017 FINDINGS: Double lumen right IJ catheter tip is just below the carina with the distal port near the cavoatrial junction. The left IJ catheter is stable. The endotracheal tube is stable. The NG tube is stable. Persistent but slightly improved diffuse interstitial and airspace process. Persistent effusions and atelectasis. IMPRESSION: Right IJ catheter in good position without complicating features. Other support apparatus is unchanged. Stable interstitial and airspace process in the lungs along with effusions and atelectasis. Electronically Signed   By: Marijo Sanes M.D.   On: 04/15/2017 12:13   Dg Chest Port 1 View  Result Date: 04/15/2017 CLINICAL DATA:  Hypoxia EXAM: PORTABLE CHEST 1 VIEW COMPARISON:  April 14, 2017 FINDINGS: Endotracheal tube tip is 4.0 cm above the carina. Nasogastric tube tip and side port are below the diaphragm in the stomach. Central catheter tip is in the left innominate vein, stable. No pneumothorax. There is diffuse interstitial and patchy alveolar edema, stable. There are pleural effusions bilaterally, stable on the right and  slightly larger on the left. There is a calcified granuloma in the left base. Heart size is within normal limits. Pulmonary vascularity is within normal limits. No adenopathy appreciable. No bone lesions. IMPRESSION: Tube and catheter positions as described without pneumothorax. Interstitial and alveolar pulmonary edema without cardiomegaly. Pleural effusions bilaterally. Suspect ARDS. Superimposed atypical infection cannot be excluded radiographically. Both entities may exist concurrently. Electronically Signed   By: Lowella Grip III M.D.   On: 04/15/2017 07:41   . chlorhexidine gluconate (MEDLINE KIT)  15 mL Mouth Rinse BID  . Chlorhexidine Gluconate Cloth  6 each Topical Daily  . heparin subcutaneous  5,000 Units Subcutaneous Q8H  . insulin aspart  0-15 Units Subcutaneous Q4H  . ipratropium-albuterol  3 mL Nebulization Q6H  . mouth rinse  15 mL Mouth Rinse 10 times per day  . pantoprazole (PROTONIX) IV  40 mg Intravenous QHS  . scopolamine  1 patch Transdermal Q72H  . sodium chloride flush  10-40 mL Intracatheter Q12H    BMET    Component Value Date/Time   NA 137 04/16/2017 0411   K 3.9 04/16/2017 0411   CL 105 04/16/2017 0411   CO2 24 04/16/2017 0411   GLUCOSE 155 (H) 04/16/2017 0411   BUN 37 (H) 04/16/2017 0411   CREATININE 3.36 (H) 04/16/2017 0411   CALCIUM 7.9 (L) 04/16/2017 0411   GFRNONAA 19 (L) 04/16/2017 0411   GFRAA 22 (L) 04/16/2017 0411   CBC    Component Value Date/Time   WBC 14.0 (H) 04/16/2017 0411   RBC 2.61 (L) 04/16/2017 0411   HGB 8.2 (L) 04/16/2017 0411   HCT 24.5 (L) 04/16/2017 0411   PLT 233 04/16/2017 0411   MCV 93.9 04/16/2017 0411   MCH 31.4 04/16/2017 0411   MCHC 33.5 04/16/2017 0411   RDW 17.7 (H) 04/16/2017 0411   LYMPHSABS 0.8 04/16/2017 0411   MONOABS 1.0 04/16/2017 0411   EOSABS 0.1 04/16/2017 0411   BASOSABS 0.0 04/16/2017 0411     Assessment/Plan:  1. AKI (unknown b/l) - baseline Cr unknown.  Continue CRRT for vol  removeal 2. Hypokalemia, resolved  3. Anemia of chronic disease - initiated po iron, can give IV when he is off abx 4. Mineral metabolic disorder - calcium wnl when corrected for low albumin, phos WNL  5. VDRF - PCCM is managing, hopeful that SBT improves with fluid removal  6. Diverticular abscess:  s/p drain placed by IR, on unasyn and TPN   Madelon Lips MD [facility] pgr [phone]

## 2017-04-16 NOTE — Progress Notes (Signed)
CSW provided note to pt's RN for pt's daughter informing employer that py's daughter has been at the hospital with pt for the past two days. At this time there are no further CSW needs. CSW signing off.     Virgie Dad Amilia Vandenbrink, MSW, [facility] Clinical Social Worker [phone]

## 2017-04-16 NOTE — Progress Notes (Signed)
PULMONARY / CRITICAL CARE MEDICINE   Name: Dominic Phillips MRN: [id number] DOB: August 12, 1960    ADMISSION DATE:  04/11/2017 CONSULTATION DATE:  04/11/2017  REFERRING MD:  Dominic Phillips  CHIEF COMPLAINT:  ARDS secondary to aspiration pneumonia complicating a diverticular abscess  BRIEF  56 year male with etoh history in withdrawal with a liver abscess and SBO on TPN.   Transferred from [facility] for management of ARDS and progressive renal insufficiency. This gentleman has a long history of heavy ETOH abuse (~ 18 beers per day) and presented to [place] on 04/02/2017 with a c/o of abdominal pain without fevers, leukocytosis or diarrhea. An abd CT revealed pericolonic inflammation/fluid collection, for which he was admitted for diverticular abscess. Was consulted by Gen Surg who suggested placement of a catheter by interventional radiology; subsequent cultures grew E. Coli, for which Zosyn was initiated. Hospital course was complicated by the development of DTs, treated with 1 beer tid and Ativan prn. He slowly improved but worsened on 10/23 with increased WBC and progressive hypoxemia. Repeat abdominal CT showed improving abscess but developing SBO and multilobar pneumonia. Hypoxemia could not be corrected with BiPAP, along with worsening renal function such that he was intubated on 10/25 and required pressors for hemodynamic support. CT chest/abdomen/pelvis on 10/26 showed bilateral pneumonia with volume overload; no change in the RLQ percutaneous drain catheter. With rising creat to 3.1 and 290 cc U/O over 24 hours, transfer was requested.   EVENTS 10/18 - Admit Dominic Phillips  04/11/2017 - Admit Dominic Phillips (at [facility] had Left IJ CVL, RLQ drain and Art line)  SUBJECTIVE/OVERNIGHT/INTERVAL HX Afebrile overnight without acute events.  Remains lightly sedated at goal while on Precedex drip at 13.7 mL/hr with intermittent Versed every 2-3 hours for agitation.  He remains off levo fed with stable BP  systolics high 50Y-DXA 128N.  VITAL SIGNS: BP 94/70   Pulse 73   Temp (!) 97.3 F (36.3 C) (Axillary)   Resp 17   Ht 5\' 11"  (1.803 m)   Wt 161 lb 6 oz (73.2 kg)   SpO2 100%   BMI 22.51 kg/m   HEMODYNAMICS:  VENTILATOR SETTINGS: Vent Mode: CPAP;PSV FiO2 (%):  [40 %] 40 % Set Rate:  [21 bmp] 21 bmp Vt Set:  [450 mL] 450 mL PEEP:  [5 cmH20] 5 cmH20 Pressure Support:  [10 cmH20] 10 cmH20 Plateau Pressure:  [18 cmH20-24 cmH20] 24 cmH20  INTAKE / OUTPUT: I/O last 3 completed shifts: In: 3799.9 [I.V.:3129.9; NG/GT:170; IV OMVEHMCNO:709] Out: 6283 [MOQHU:7654; Emesis/NG output:200; YTKPT:4656; Stool:100]  UOP: 870 cc  PHYSICAL EXAMINATION:  General Appearance:    56 yo male, NAD   Head:    NCAT, ETT in place   Eyes:    PERRL        Nose:   NG tube - with minimal OP  Throat:  ETT TUBE in place   Neck:  Supple,  No enlargement/tenderness/nodules     Lungs:     Coarse, Barrel chest, Synchronous with vent   Chest wall:    No deformity  Heart:    S1 and S2 normal, no MRG   Abdomen:     Mildly distended, no masses, no organomegaly, +BS         Extremities:   Extremities- cachectic      Skin:   Intact in exposed areas     Neurologic:   Sedation - precedex -> RASS score -2   LABS:  LA 0.9  Trop <0.03  Pre-alb <5  ABG - pH 7.3, pCO2 48.1, bicarb 24.9  Fibrinogen >800  plt 298  INR 15.5 high  APTT 35 wnl Iron 16 low TIBC low 123 Urine Na 60 Phos 3.1 wnl Mag 1.9 wnl   PULMONARY  Recent Labs Lab 04/11/17 1703 04/12/17 0227 04/12/17 0852 04/13/17 0218 04/14/17 0425 04/15/17 0415  PHART 7.304* 7.344* 7.337* 7.321* 7.330* 7.318*  PCO2ART 55.0* 48.4* 47.3 48.1* 46.9 45.8  PO2ART 103.0 108.0 106.0 179.0* 107 105  HCO3 27.3 26.3 25.3 24.9 24.0 22.8  TCO2 29 28 27 26   --   --   O2SAT 97.0 98.0 98.0 99.0 97.4 97.2   CBC  Recent Labs Lab 04/14/17 0333 04/15/17 0448 04/16/17 0411  HGB 7.5* 7.7* 8.2*  HCT 22.5* 23.5* 24.5*  WBC 10.6* 11.4* 14.0*  PLT  277 278 233   COAGULATION  Recent Labs Lab 04/13/17 0952  INR 1.24   CARDIAC  Recent Labs Lab 04/13/17 0520  TROPONINI <0.03   No results for input(s): PROBNP in the last 168 hours.  CHEMISTRY  Recent Labs Lab 04/12/17 0600  04/13/17 0520 04/14/17 0333 04/15/17 0448 04/15/17 1700 04/16/17 0411  NA 132*  < > 133* 134* 135 136 137  K 3.7  < > 3.6 3.4* 3.5 3.7 3.9  CL 95*  < > 98* 100* 102 103 105  CO2 26  < > 25 24 23 24 24   GLUCOSE 139*  < > 113* 145* 205* 142* 155*  BUN 35*  < > 41* 47* 53* 46* 37*  CREATININE 3.93*  < > 4.56* 4.77* 5.22* 4.18* 3.36*  CALCIUM 7.9*  < > 7.8* 7.9* 8.0* 7.9* 7.9*  MG 2.2  --  1.9 2.0 1.9  --  2.0  PHOS 2.0*  --  3.1 3.2 3.1 2.5 2.1*  < > = values in this interval not displayed. Estimated Creatinine Clearance: 26 mL/min (A) (by C-G formula based on SCr of 3.36 mg/dL (H)).  LIVER  Recent Labs Lab 04/12/17 0600 04/13/17 0520 04/13/17 0952 04/15/17 1700 04/16/17 0411  AST 44* 44*  --   --  20  ALT 12* 16*  --   --  11*  ALKPHOS 201* 188*  --   --  92  BILITOT 0.7 0.7  --   --  0.4  PROT 5.8* 5.7*  --   --  5.5*  ALBUMIN 1.5* 1.3*  --  1.4* 1.4*  INR  --   --  1.24  --   --    INFECTIOUS  Recent Labs Lab 04/13/17 0050  LATICACIDVEN 0.9   ENDOCRINE CBG (last 3)   Recent Labs  04/16/17 0043 04/16/17 0425 04/16/17 0722  GLUCAP 109* 140* 133*   IMAGING x48h  - image(s) personally visualized  -   highlighted in bold Dg Chest Port 1 View  Result Date: 04/15/2017 CLINICAL DATA:  Central line placement. EXAM: PORTABLE CHEST 1 VIEW COMPARISON:  04/15/2017 FINDINGS: Double lumen right IJ catheter tip is just below the carina with the distal port near the cavoatrial junction. The left IJ catheter is stable. The endotracheal tube is stable. The NG tube is stable. Persistent but slightly improved diffuse interstitial and airspace process. Persistent effusions and atelectasis. IMPRESSION: Right IJ catheter in good position  without complicating features. Other support apparatus is unchanged. Stable interstitial and airspace process in the lungs along with effusions and atelectasis. Electronically Signed   By: Marijo Sanes M.D.   On: 04/15/2017 12:13   Dg Chest [facility]  1 View  Result Date: 04/15/2017 CLINICAL DATA:  Hypoxia EXAM: PORTABLE CHEST 1 VIEW COMPARISON:  April 14, 2017 FINDINGS: Endotracheal tube tip is 4.0 cm above the carina. Nasogastric tube tip and side port are below the diaphragm in the stomach. Central catheter tip is in the left innominate vein, stable. No pneumothorax. There is diffuse interstitial and patchy alveolar edema, stable. There are pleural effusions bilaterally, stable on the right and slightly larger on the left. There is a calcified granuloma in the left base. Heart size is within normal limits. Pulmonary vascularity is within normal limits. No adenopathy appreciable. No bone lesions. IMPRESSION: Tube and catheter positions as described without pneumothorax. Interstitial and alveolar pulmonary edema without cardiomegaly. Pleural effusions bilaterally. Suspect ARDS. Superimposed atypical infection cannot be excluded radiographically. Both entities may exist concurrently. Electronically Signed   By: Lowella Grip III M.D.   On: 04/15/2017 07:41   IMAGING CXR 10.28 - Hazy reticulonodular opacities bilaterally, L>R, compatible with the given history of ARDS, possibly some component of pulmonary edema; bibasilar opacities suggesting small layering pleural effusions and/or atelectasis  ECHO 10/28 >> EF 55-60% , no regional wall abnormality  CXR 10/29 >>Stable bilateral diffuse lung densities are noted concerning for edema or inflammation. Mild right pleural effusion. Renal US 10/29>> no obstructive uropathy, borderline increased echogenicity which can be seen with chronic medical renal disease.  Intra-abdominal ascites present and bilateral pleural effusions.   CXR 10/30 >> ARDS pattern is  unchanged when compared with the previous exam, diffuse pulmonary edema and moderate sized R pleural effusion  CXR 10/31 right IJ catheter in place, stable interstitial and airspace processes along with effusions and atelectasis  CULTURES: None   ANTIBIOTICS: Zosyn 10/27 > 10/31 Unasyn 10/31 >>  LINES/TUBES: ETT 10/25 >> L IJ 10/25 >> L radial 10/25 > 10/27 Liver drain 04/02/22 >> Foley 10/27 >> NGT 10/27 >>  ASSESSMENT / PLAN:  PULMONARY Seems to have copd at baseline ARDS  - Begin PS trials - Titrate O2 for sat of 88-92%   CARDIOVASCULAR Circulatory shock - Tele - CVP monitoring - Pressures stable off Levophed  - ECHO with EF 55-60% , no regional wall abnormality   RENAL AKI with oliguria Renal US 10/29 with no obstructive uropathy Hypokalemia resolved - S/p high dose Lasix 10/30  - CRRT today with a lot of volume negative s/p HD cath 10/31 - IVF @ [facility]  - Assess fluid status daily  - Daily BMET  - Replete lytes as needed   GASTROINTESTINAL Diverticular abscess with +/- SBO s/p RLQ drain at [facility] between 04/02/17 and 04/11/17 Rectal tube with OP - Per surgery continue conservative management, repeat abd CT within 24-48 hours to determine stability of drain/abscess, may need resection and colostomy, okay to restart TF - IV Protonix  - TPN per pharmacy  HEMATOLOGIC  Recent Labs Lab 04/14/17 0333 04/15/17 0448 04/16/17 0411  HGB 7.5* 7.7* 8.2*  HCT 22.5* 23.5* 24.5*  WBC 10.6* 11.4* 14.0*  PLT 277 278 233   Anemia of critical illness Hgb stable 7.7 - Monitor CBC - Initiated po iron, can give IV when he is off abx  INFECTIOUS Diverticular abscess at [facility] - s/p drain placed by IR  - Afebrile with WBC 14 - Abx d/w ID given multiple species - rec Zosyn then switch Unasyn per pharm on CRRT  - Monitor fever curve   ENDOCRINE - No known prior hx  - CBG's 100-140 - ICU hyperglycemia protocol  NEUROLOGIC Head CT  10/26: No intracranial mass,  hemorrhage or extra-axial fluid collection ETOH dependency with DT at [facility] - RASS goal: -2 - Continue Precedex gtt - Versed PRN for agitation  - D/c Clonazepam 1 mg BID and Clonidine 0.2 mg TID as of 10/31 - Will sedation wean as able   FAMILY  - Updates:   No family at bedside this morning - will update later today.  Case manager called regarding complex social situation at home and support provided.   Harriet Butte, DO [place] PGY-2   Attending Note:  56 year old alcoholic male with liver abscess and pulmonary edema with aspiration pneumonia resulting in VDRF and septic shock.  On exam, less edema than yesterday with coarse BS diffusely.  I reviewed CXR, pulmonary edema noted.  Continue unasyn.  Clonidine 0.2 mg TID to spare precedex.  Wean sedation as able.  D/C clonazepam.  Bein PS trials but no extubation til volume negative enough to wean.  Hold off trach for now. Repeat CT in AM for ?need resection.  D/C TPN if tolerating TF, begin TF.  The patient is critically ill with multiple organ systems failure and requires high complexity decision making for assessment and support, frequent evaluation and titration of therapies, application of advanced monitoring technologies and extensive interpretation of multiple databases.   Critical Care Time devoted to patient care services described in this note is  35  Minutes. This time reflects time of care of this signee Dr Jennet Maduro. This critical care time does not reflect procedure time, or teaching time or supervisory time of PA/NP/Med student/Med Resident etc but could involve care discussion time.  Rush Farmer, M.D. [facility] Pulmonary/Critical Care Medicine. Pager: [phone]. After hours pager: [phone].

## 2017-04-16 NOTE — Progress Notes (Signed)
Subjective/Chief Complaint: Remains on the vent NG output less Having stool out of rectum   Objective: Vital signs in last 24 hours: Temp:  [97.3 F (36.3 C)-98.9 F (37.2 C)] 97.3 F (36.3 C) (11/01 0824) Pulse Rate:  [63-119] 69 (11/01 0800) Resp:  [17-40] 26 (11/01 0800) BP: (92-140)/(58-98) 96/64 (11/01 0800) SpO2:  [90 %-100 %] 100 % (11/01 0800) FiO2 (%):  [40 %] 40 % (11/01 0800) Weight:  [73.2 kg (161 lb 6 oz)] 73.2 kg (161 lb 6 oz) (11/01 0412) Last BM Date: 04/15/17  Intake/Output from previous day: 10/31 0701 - 11/01 0700 In: 2690.9 [I.V.:2120.9; NG/GT:170; IV Piggyback:400] Out: 4808 [Urine:870; Emesis/NG output:200; Stool:100] Intake/Output this shift: Total I/O In: 81.6 [I.V.:81.6] Out: 217 [Urine:10; Other:207]  Exam: Abdomen still full with guarding in the LLQ.  Otherwise unchanged Drain purulent vs stool  Lab Results:   Recent Labs  04/15/17 0448 04/16/17 0411  WBC 11.4* 14.0*  HGB 7.7* 8.2*  HCT 23.5* 24.5*  PLT 278 233   BMET  Recent Labs  04/15/17 1700 04/16/17 0411  NA 136 137  K 3.7 3.9  CL 103 105  CO2 24 24  GLUCOSE 142* 155*  BUN 46* 37*  CREATININE 4.18* 3.36*  CALCIUM 7.9* 7.9*   PT/INR  Recent Labs  04/13/17 0952  LABPROT 15.5*  INR 1.24   ABG  Recent Labs  04/14/17 0425 04/15/17 0415  PHART 7.330* 7.318*  HCO3 24.0 22.8    Studies/Results: Dg Chest Port 1 View  Result Date: 04/15/2017 CLINICAL DATA:  Central line placement. EXAM: PORTABLE CHEST 1 VIEW COMPARISON:  04/15/2017 FINDINGS: Double lumen right IJ catheter tip is just below the carina with the distal port near the cavoatrial junction. The left IJ catheter is stable. The endotracheal tube is stable. The NG tube is stable. Persistent but slightly improved diffuse interstitial and airspace process. Persistent effusions and atelectasis. IMPRESSION: Right IJ catheter in good position without complicating features. Other support apparatus is  unchanged. Stable interstitial and airspace process in the lungs along with effusions and atelectasis. Electronically Signed   By: Marijo Sanes M.D.   On: 04/15/2017 12:13   Dg Chest Port 1 View  Result Date: 04/15/2017 CLINICAL DATA:  Hypoxia EXAM: PORTABLE CHEST 1 VIEW COMPARISON:  April 14, 2017 FINDINGS: Endotracheal tube tip is 4.0 cm above the carina. Nasogastric tube tip and side port are below the diaphragm in the stomach. Central catheter tip is in the left innominate vein, stable. No pneumothorax. There is diffuse interstitial and patchy alveolar edema, stable. There are pleural effusions bilaterally, stable on the right and slightly larger on the left. There is a calcified granuloma in the left base. Heart size is within normal limits. Pulmonary vascularity is within normal limits. No adenopathy appreciable. No bone lesions. IMPRESSION: Tube and catheter positions as described without pneumothorax. Interstitial and alveolar pulmonary edema without cardiomegaly. Pleural effusions bilaterally. Suspect ARDS. Superimposed atypical infection cannot be excluded radiographically. Both entities may exist concurrently. Electronically Signed   By: Lowella Grip III M.D.   On: 04/15/2017 07:41    Anti-infectives: Anti-infectives    Start     Dose/Rate Route Frequency Ordered Stop   04/15/17 2125  Ampicillin-Sulbactam (UNASYN) 3 g in sodium chloride 0.9 % 100 mL IVPB     3 g 200 mL/hr over 30 Minutes Intravenous Every 8 hours 04/15/17 1529     04/15/17 1100  ampicillin-sulbactam (UNASYN) 1.5 g in sodium chloride 0.9 % 50 mL IVPB  Status:  Discontinued     1.5 g 100 mL/hr over 30 Minutes Intravenous Every 12 hours 04/15/17 0937 04/15/17 1529   04/13/17 2200  levofloxacin (LEVAQUIN) IVPB 500 mg  Status:  Discontinued     500 mg 100 mL/hr over 60 Minutes Intravenous Every 48 hours 04/12/17 0114 04/12/17 0846   04/13/17 1800  piperacillin-tazobactam (ZOSYN) IVPB 2.25 g  Status:  Discontinued      2.25 g 100 mL/hr over 30 Minutes Intravenous Every 8 hours 04/13/17 1050 04/15/17 0937   04/12/17 0200  piperacillin-tazobactam (ZOSYN) IVPB 3.375 g  Status:  Discontinued     3.375 g 12.5 mL/hr over 240 Minutes Intravenous Every 8 hours 04/12/17 0103 04/13/17 1050   04/12/17 0000  levofloxacin (LEVAQUIN) IVPB 750 mg  Status:  Discontinued     750 mg 100 mL/hr over 90 Minutes Intravenous Every 48 hours 04/11/17 2230 04/12/17 0114      Assessment/Plan:  Diverticulitis with abscess s/p drain, possible fistula, ARDS on vent, CRRT  Ok from surgical standpoint to start tube feeds per the NG. Will repeat the CT on his abdomen with NG contrast in next 24 to 48 hours to assess drain/abscess if he is stable to go down to CT  LOS: 5 days    Dominic Phillips A 04/16/2017

## 2017-04-16 NOTE — Progress Notes (Signed)
PHARMACY - ADULT TOTAL PARENTERAL NUTRITION CONSULT NOTE   Pharmacy Consult:  TPN Indication:  Prolonged ileus, possible SBO  Patient Measurements: Height: 5\' 11"  (180.3 cm) Weight: 161 lb 6 oz (73.2 kg) IBW/kg (Calculated) : 75.3   Body mass index is 22.51 kg/m. *Using TBW on admit - 68 kg  Assessment:  14 YOM with PMH notable for heavy ETOH use and smoking admitted to [facility] on 04/02/17 with abdominal pain and fever.  Found to have diverticular abscess (IR catheter) and course complicated by DTs, possible SBO, PNA progressing into respiratory failure requiring intubation on 04/09/17 and pressors for hemodynamic instability.  Patient was transferred to [facility] on 04/11/17 for ARDS and progressive renal insufficiency.   GI: BL prealbumin <5. NGT output was 189ml yesterday and rectal tube output 159ml. Last BM 10/31, ileus seems to have resolved although worrisome for possible fistula. Albumin low at 1.4. Prealbumin less than 5. PPI IV Endo: No hx DM. CBGs remain controlled but trending up to 100-150s. Insulin requirements in the past 24 hours: 9 units of SSI Lytes: Na/Cl have slowly trended up to wnl. K+ trending up well to 3.9 (goal >/= 4 for ileus), Mag 2.0 (goal >/= 2 for ileus). Phos down to 2.1 today, paged Renal but no reponse, will increase Phos some in TPN and follow up tomorrow. CoCa 10. Planning to continue electrolytes in TPN while on CRRT since patient also having UOP and electrolytes were trending down when we used lower amounts Renal: AKI - SCr continued to worsen so started on CRRT on 10/31. BUN down to 37, UOP low but up a little to 0.5 ml/kg/hr, net +3.5 L since admit but down since CRRT started. Lasix IV TID Pulm: respiratory acidosis, FiO2 40% - Duonebs Cards: VSS - off Levophed gtt Hepatobil: LFTs ok and Tbili wnl. TG elevated at 203. Neuro: Precedex gtt, PRN Versed.  GCS 11, CPOT 0-3, RASS 1 (goal -1) ID: Now on Unasyn for IAI/HCAP. Has diverticular  abscess now s/p IR drainage. Afebrile, WBC 14  Best Practices: PPI IV, Hep [place] TPN Access: triple lumen CVC placed 04/11/17 TPN start date: 04/09/17 (at [facility], last received lipids on 10/26)   Nutritional Goals (RD rec from 10/29): KCal: 4696  Potein: 105-120 gm per day Fluid: 1.8 L (remains on Precedex ~644mL yesterday)   Current Nutrition:  NPO Customized TPN  Plan:   Continue customized TPN at 50 ml/hr This TPN provides 96 g of protein, 240 g of dextrose, and 36 g of lipids which gives a total of 1,560 kcals per day meeting ~90% of nutritional needs Electrolytes in TPN: continue current amounts exc increase Phos, Cl:Ac to 1:1 Add thiamine 100mg  and folic acid 1mg  to TPN Add MVI and trace elements to TPN Continue NS at 31ml/hr Continue moderate SSI Q4h and adjust as needed Monitor TPN labs, renal panel tomorrow  May start TFs today, follow up tomorrow for a chance to start weaning [place], PharmD, [facility] Clinical Pharmacist Pager [phone] 04/16/2017 7:36 AM

## 2017-04-16 NOTE — Progress Notes (Signed)
Nutrition Consult / Follow-up  DOCUMENTATION CODES:   Non-severe (moderate) malnutrition in context of chronic illness  INTERVENTION:    Start trickle feeding via NGT with Vital AF 1.2 at 20 ml/h.  If tolerating TF well tomorrow, can increase by 10 ml every 4 hours to goal rate of 65 ml/h (1560 ml, 1872 kcal, 117 gm protein, 1265 ml free water daily.  NUTRITION DIAGNOSIS:   Moderate Malnutrition related to chronic illness (ETOH abuse) as evidenced by mild fat depletion, mild muscle depletion.  Ongoing  GOAL:   Patient will meet greater than or equal to 90% of their needs  Met with TPN, now transitioning to TF  MONITOR:   Vent status, I & O's, Labs  REASON FOR ASSESSMENT:   Consult Enteral/tube feeding initiation and management  ASSESSMENT:   56 year old male with PMH of ETOH abuse who was admitted to [facility] on 10/18 with diverticular abscess; hospital course complicated by DT's, development of a SBO, and PNA; required intubation on 10/25. He was transferred to [facility] on 10/27 with worsening renal failure, ARDS, aspiration PNA.  Discussed patient in ICU rounds and with RN today. Remains on CRRT. Received MD Consult for TF initiation and management. Per review of surgery notes, okay to begin TF today. Currently receiving TPN at 50 ml/h providing 1200 ml, 1524 kcal (81% of re-estimated needs), 96 gm protein (91% of estimated needs) per day. Patient is currently intubated on ventilator support MV: 12 L/min Temp (24hrs), Avg:97.6 F (36.4 C), Min:97.3 F (36.3 C), Max:97.8 F (36.6 C)   Labs reviewed. Phosphorus 2.1 (L) CBG's: 140-133-116 Medications reviewed.  Diet Order:  Diet NPO time specified TPN ADULT (ION) TPN ADULT (ION)  EDUCATION NEEDS:   No education needs have been identified at this time  Skin:  Skin Assessment: Skin Integrity Issues: Skin Integrity Issues:: Stage I Stage I: sacrum  Last BM:  10/31 (rectal tube)  Height:   Ht  Readings from Last 1 Encounters:  04/11/17 '5\' 11"'$  (1.803 m)    Weight:   Wt Readings from Last 1 Encounters:  04/16/17 161 lb 6 oz (73.2 kg)    Ideal Body Weight:  78.2 kg  BMI:  Body mass index is 22.51 kg/m.  Estimated Nutritional Needs:   Kcal:  1875  Protein:  105-120 gm  Fluid:  1.8 L    Molli Barrows, RD, LDN, CNSC Pager [phone] After Hours Pager [phone]

## 2017-04-17 ENCOUNTER — Inpatient Hospital Stay (HOSPITAL_COMMUNITY): Payer: Medicaid Other

## 2017-04-17 LAB — BLOOD GAS, ARTERIAL
ACID-BASE EXCESS: 1.2 mmol/L (ref 0.0–2.0)
Bicarbonate: 26.3 mmol/L (ref 20.0–28.0)
DRAWN BY: 41977
FIO2: 40
MECHVT: 450 mL
O2 SAT: 98.4 %
PATIENT TEMPERATURE: 98.6
PCO2 ART: 49.8 mmHg — AB (ref 32.0–48.0)
PEEP/CPAP: 5 cmH2O
PH ART: 7.342 — AB (ref 7.350–7.450)
RATE: 21 resp/min
pO2, Arterial: 134 mmHg — ABNORMAL HIGH (ref 83.0–108.0)

## 2017-04-17 LAB — RENAL FUNCTION PANEL
ANION GAP: 6 (ref 5–15)
ANION GAP: 8 (ref 5–15)
Albumin: 1.5 g/dL — ABNORMAL LOW (ref 3.5–5.0)
Albumin: 1.7 g/dL — ABNORMAL LOW (ref 3.5–5.0)
BUN: 19 mg/dL (ref 6–20)
BUN: 16 mg/dL (ref 6–20)
CALCIUM: 7.9 mg/dL — AB (ref 8.9–10.3)
CHLORIDE: 102 mmol/L (ref 101–111)
CO2: 27 mmol/L (ref 22–32)
CO2: 27 mmol/L (ref 22–32)
CREATININE: 1.84 mg/dL — AB (ref 0.61–1.24)
Calcium: 7.8 mg/dL — ABNORMAL LOW (ref 8.9–10.3)
Chloride: 104 mmol/L (ref 101–111)
Creatinine, Ser: 1.9 mg/dL — ABNORMAL HIGH (ref 0.61–1.24)
GFR calc Af Amer: 45 mL/min — ABNORMAL LOW (ref >60)
GFR calc non Af Amer: 38 mL/min — ABNORMAL LOW (ref >60)
GFR, EST AFRICAN AMERICAN: 46 mL/min — AB (ref >60)
GFR, EST NON AFRICAN AMERICAN: 40 mL/min — AB (ref >60)
GLUCOSE: 118 mg/dL — AB (ref 65–99)
Glucose, Bld: 102 mg/dL — ABNORMAL HIGH (ref 65–99)
POTASSIUM: 3.9 mmol/L (ref 3.5–5.1)
Phosphorus: 1.9 mg/dL — ABNORMAL LOW (ref 2.5–4.6)
Phosphorus: 2.8 mg/dL (ref 2.5–4.6)
Potassium: 4.2 mmol/L (ref 3.5–5.1)
Sodium: 137 mmol/L (ref 135–145)
Sodium: 137 mmol/L (ref 135–145)

## 2017-04-17 LAB — GLUCOSE, CAPILLARY
GLUCOSE-CAPILLARY: 119 mg/dL — AB (ref 65–99)
GLUCOSE-CAPILLARY: 98 mg/dL (ref 65–99)
Glucose-Capillary: 106 mg/dL — ABNORMAL HIGH (ref 65–99)
Glucose-Capillary: 94 mg/dL (ref 65–99)
Glucose-Capillary: 97 mg/dL (ref 65–99)
Glucose-Capillary: 118 mg/dL — ABNORMAL HIGH (ref 65–99)

## 2017-04-17 LAB — CBC
HCT: 25.2 % — ABNORMAL LOW (ref 39.0–52.0)
Hemoglobin: 8.4 g/dL — ABNORMAL LOW (ref 13.0–17.0)
MCH: 31.7 pg (ref 26.0–34.0)
MCHC: 33.3 g/dL (ref 30.0–36.0)
MCV: 95.1 fL (ref 78.0–100.0)
Platelets: 215 10*3/uL (ref 150–400)
RBC: 2.65 MIL/uL — AB (ref 4.22–5.81)
RDW: 18.1 % — AB (ref 11.5–15.5)
WBC: 15.5 10*3/uL — AB (ref 4.0–10.5)

## 2017-04-17 LAB — MAGNESIUM: MAGNESIUM: 2.2 mg/dL (ref 1.7–2.4)

## 2017-04-17 MED ORDER — FOLIC ACID 1 MG PO TABS
1.0000 mg | ORAL_TABLET | Freq: Every day | ORAL | Status: DC
  Filled 2017-04-17: qty 1

## 2017-04-17 MED ORDER — HEPARIN 1000 UNIT/ML FOR PERITONEAL DIALYSIS: 3000.0000 [IU] | INTRAMUSCULAR | Status: DC | PRN

## 2017-04-17 MED ORDER — ORAL CARE MOUTH RINSE
15.0000 mL | Freq: Two times a day (BID) | OROMUCOSAL | Status: DC
  Administered 2017-04-17 – 2017-05-20 (×50): 15 mL via OROMUCOSAL

## 2017-04-17 MED ORDER — PIPERACILLIN-TAZOBACTAM 3.375 G IVPB
3.3750 g | Freq: Three times a day (TID) | INTRAVENOUS | Status: DC
  Filled 2017-04-17: qty 50

## 2017-04-17 MED ORDER — HEPARIN 1000 UNIT/ML FOR PERITONEAL DIALYSIS
3000.0000 [IU] | INTRAMUSCULAR | Status: DC | PRN
  Administered 2017-04-17: 2400 [IU] via INTRAVENOUS_CENTRAL
  Filled 2017-04-17 (×3): qty 3

## 2017-04-17 MED ORDER — METOPROLOL TARTRATE 5 MG/5ML IV SOLN
5.0000 mg | Freq: Four times a day (QID) | INTRAVENOUS | Status: DC | PRN
  Administered 2017-04-17 – 2017-04-27 (×8): 5 mg via INTRAVENOUS
  Filled 2017-04-17 (×8): qty 5

## 2017-04-17 MED ORDER — METOPROLOL TARTRATE 5 MG/5ML IV SOLN: 2.5000 mg | Freq: Four times a day (QID) | INTRAVENOUS | Status: DC

## 2017-04-17 MED ORDER — METOPROLOL TARTRATE 5 MG/5ML IV SOLN
2.5000 mg | Freq: Four times a day (QID) | INTRAVENOUS | Status: DC | PRN
  Administered 2017-04-17: 2.5 mg via INTRAVENOUS
  Filled 2017-04-17: qty 5

## 2017-04-17 MED ORDER — SODIUM PHOSPHATES 45 MMOLE/15ML IV SOLN
25.0000 mmol | Freq: Once | INTRAVENOUS | Status: DC
  Filled 2017-04-17: qty 8.33

## 2017-04-17 MED ORDER — INSULIN ASPART 100 UNIT/ML ~~LOC~~ SOLN
0.0000 [IU] | SUBCUTANEOUS | Status: DC
  Administered 2017-04-18 – 2017-04-28 (×7): 1 [IU] via SUBCUTANEOUS

## 2017-04-17 MED ORDER — PANTOPRAZOLE SODIUM 40 MG PO PACK
40.0000 mg | PACK | Freq: Every day | ORAL | Status: DC
  Filled 2017-04-17: qty 20

## 2017-04-17 MED ORDER — VITAMIN B-1 100 MG PO TABS
100.0000 mg | ORAL_TABLET | Freq: Every day | ORAL | Status: DC
  Filled 2017-04-17: qty 1

## 2017-04-17 MED ORDER — VITAMIN B-1 100 MG PO TABS
100.0000 mg | ORAL_TABLET | Freq: Every day | ORAL | Status: DC
  Administered 2017-04-17 – 2017-05-12 (×26): 100 mg via ORAL
  Filled 2017-04-17 (×27): qty 1

## 2017-04-17 MED ORDER — METOPROLOL SUCCINATE 12.5 MG HALF TABLET
12.5000 mg | ORAL_TABLET | Freq: Every day | ORAL | Status: DC
  Filled 2017-04-17: qty 1

## 2017-04-17 MED ORDER — METOPROLOL TARTRATE 5 MG/5ML IV SOLN: 2.5000 mg | Freq: Two times a day (BID) | INTRAVENOUS | Status: DC

## 2017-04-17 MED ORDER — DEXMEDETOMIDINE HCL IN NACL 400 MCG/100ML IV SOLN
0.4000 ug/kg/h | INTRAVENOUS | Status: DC
  Administered 2017-04-17: 0.5 ug/kg/h via INTRAVENOUS
  Filled 2017-04-17: qty 100

## 2017-04-17 MED ORDER — SODIUM PHOSPHATES 45 MMOLE/15ML IV SOLN
30.0000 mmol | Freq: Once | INTRAVENOUS | Status: AC
  Administered 2017-04-17: 30 mmol via INTRAVENOUS
  Filled 2017-04-17: qty 10

## 2017-04-17 MED ORDER — FOLIC ACID 1 MG PO TABS
1.0000 mg | ORAL_TABLET | Freq: Every day | ORAL | Status: DC
  Administered 2017-04-17 – 2017-05-12 (×26): 1 mg via ORAL
  Filled 2017-04-17 (×27): qty 1

## 2017-04-17 MED ORDER — PANTOPRAZOLE SODIUM 40 MG PO TBEC
40.0000 mg | DELAYED_RELEASE_TABLET | Freq: Every day | ORAL | Status: DC
  Administered 2017-04-17 – 2017-05-12 (×26): 40 mg via ORAL
  Filled 2017-04-17 (×26): qty 1

## 2017-04-17 MED ORDER — PIPERACILLIN-TAZOBACTAM 3.375 G IVPB 30 MIN
3.3750 g | Freq: Four times a day (QID) | INTRAVENOUS | Status: DC
  Administered 2017-04-17 – 2017-04-18 (×4): 3.375 g via INTRAVENOUS
  Filled 2017-04-17 (×5): qty 50

## 2017-04-17 MED ORDER — FENTANYL CITRATE (PF) 100 MCG/2ML IJ SOLN
25.0000 ug | INTRAMUSCULAR | Status: DC | PRN
  Administered 2017-04-17 – 2017-04-18 (×3): 50 ug via INTRAVENOUS
  Administered 2017-04-18 (×3): 25 ug via INTRAVENOUS
  Administered 2017-04-18: 40 ug via INTRAVENOUS
  Administered 2017-04-19 – 2017-04-22 (×14): 50 ug via INTRAVENOUS
  Administered 2017-04-23: 100 ug via INTRAVENOUS
  Administered 2017-04-23 (×2): 25 ug via INTRAVENOUS
  Administered 2017-04-23: 50 ug via INTRAVENOUS
  Administered 2017-04-24 (×2): 25 ug via INTRAVENOUS
  Administered 2017-04-24 – 2017-04-25 (×2): 50 ug via INTRAVENOUS
  Administered 2017-04-25 – 2017-04-26 (×3): 25 ug via INTRAVENOUS
  Administered 2017-04-27 (×2): 50 ug via INTRAVENOUS
  Administered 2017-04-29 (×2): 25 ug via INTRAVENOUS
  Filled 2017-04-17 (×36): qty 2

## 2017-04-17 NOTE — Progress Notes (Signed)
Called CCM regarding tremors in both arms and legs. Patient did experience DT's at [facility] before he was transferred to [facility] as he is a heavy drinker at home. CCM said to continue to monitor. Bartholomew Crews, RN 04/17/2017 3:58 PM

## 2017-04-17 NOTE — Progress Notes (Signed)
S: CRRT ongoing.  Tolerating well.  For extubation today  O:BP 113/68   Pulse (!) 111   Temp 98.2 F (36.8 C) (Oral)   Resp (!) 23   Ht 5\' 11"  (1.803 m)   Wt 71.5 kg (157 lb 10.1 oz)   SpO2 99%   BMI 21.98 kg/m   Intake/Output Summary (Last 24 hours) at 04/17/17 1205 Last data filed at 04/17/17 1100  Gross per 24 hour  Intake          2044.16 ml  Output             5464 ml  Net         -3419.84 ml   Intake/Output: I/O last 3 completed shifts: In: 3553.3 [I.V.:2523; NG/GT:278.3; IV Piggyback:752] Out: 7867 [Urine:508; Emesis/NG output:250; Other:7921]  Intake/Output this shift:  Total I/O In: 140.1 [I.V.:98.4; NG/GT:41.7] Out: 895 [Urine:62; Other:833] Weight change: -1.7 kg (-3 lb 12 oz) Gen: no acute distress, intubated, able to follow commands/ respond to questioning HEENT: NG tube, nontunneled HD cath in place, ETT CVS: RRR, no murmur appreciated  Resp: breath sounds coarse rhonchi but improved EHM:[id number], hypoactive bowel sounds Ext: no peripheral edema, some dependent sacral edema   Recent Labs Lab 04/12/17 0600  04/13/17 0520 04/14/17 0333 04/15/17 0448 04/15/17 1700 04/16/17 0411 04/16/17 1505 04/17/17 0414  NA 132*  < > 133* 134* 135 136 137 138 137  K 3.7  < > 3.6 3.4* 3.5 3.7 3.9 4.0 3.9  CL 95*  < > 98* 100* 102 103 105 105 104  CO2 26  < > 25 24 23 24 24 26 27   GLUCOSE 139*  < > 113* 145* 205* 142* 155* 155* 118*  BUN 35*  < > 41* 47* 53* 46* 37* 28* 19  CREATININE 3.93*  < > 4.56* 4.77* 5.22* 4.18* 3.36* 2.46* 1.90*  ALBUMIN 1.5*  --  1.3*  --   --  1.4* 1.4* 1.4* 1.5*  CALCIUM 7.9*  < > 7.8* 7.9* 8.0* 7.9* 7.9* 7.8* 7.8*  PHOS 2.0*  --  3.1 3.2 3.1 2.5 2.1* 1.7* 1.9*  AST 44*  --  44*  --   --   --  20  --   --   ALT 12*  --  16*  --   --   --  11*  --   --   < > = values in this interval not displayed. Liver Function Tests:  Recent Labs Lab 04/12/17 0600 04/13/17 0520  04/16/17 0411 04/16/17 1505 04/17/17 0414  AST 44* 44*  --  20   --   --   ALT 12* 16*  --  11*  --   --   ALKPHOS 201* 188*  --  92  --   --   BILITOT 0.7 0.7  --  0.4  --   --   PROT 5.8* 5.7*  --  5.5*  --   --   ALBUMIN 1.5* 1.3*  < > 1.4* 1.4* 1.5*  < > = values in this interval not displayed. No results for input(s): LIPASE, AMYLASE in the last 168 hours. No results for input(s): AMMONIA in the last 168 hours. CBC:  Recent Labs Lab 04/13/17 0520  04/14/17 0333 04/15/17 0448 04/16/17 0411 04/17/17 0414  WBC 11.6*  --  10.6* 11.4* 14.0* 15.5*  NEUTROABS 9.9*  --  8.7* 9.2* 12.1*  --   HGB 7.7*  < > 7.5* 7.7* 8.2* 8.4*  HCT 23.0*  < >  22.5* 23.5* 24.5* 25.2*  MCV 94.3  --  93.8 94.0 93.9 95.1  PLT 296  < > 277 278 233 215  < > = values in this interval not displayed. Cardiac Enzymes:  Recent Labs Lab 04/13/17 0520  TROPONINI <0.03   CBG:  Recent Labs Lab 04/16/17 1931 04/16/17 2323 04/17/17 0315 04/17/17 0829 04/17/17 1117  GLUCAP 84 105* 106* 119* 94    Iron Studies:  No results for input(s): IRON, TIBC, TRANSFERRIN, FERRITIN in the last 72 hours. Studies/Results: Dg Chest Port 1 View  Result Date: 04/17/2017 CLINICAL DATA:  Intubation. EXAM: PORTABLE CHEST 1 VIEW COMPARISON:  04/15/2017 . FINDINGS: Endotracheal tube, left IJ line, right IJ dual-lumen catheter, NG tube in stable position. Heart size normal. Interim slight improvement of bilateral interstitial prominence and basilar infiltrates/edema. Small right pleural effusion again noted. No pneumothorax P. IMPRESSION: 1.  Lines and tubes in stable position. 2. Interim slight improvement of bilateral interstitial prominence and basilar infiltrates and edema. Persistent small right pleural effusion . Electronically Signed   By: Marcello Moores  Register   On: 04/17/2017 07:37   Dg Chest Port 1 View  Result Date: 04/15/2017 CLINICAL DATA:  Central line placement. EXAM: PORTABLE CHEST 1 VIEW COMPARISON:  04/15/2017 FINDINGS: Double lumen right IJ catheter tip is just below the  carina with the distal port near the cavoatrial junction. The left IJ catheter is stable. The endotracheal tube is stable. The NG tube is stable. Persistent but slightly improved diffuse interstitial and airspace process. Persistent effusions and atelectasis. IMPRESSION: Right IJ catheter in good position without complicating features. Other support apparatus is unchanged. Stable interstitial and airspace process in the lungs along with effusions and atelectasis. Electronically Signed   By: Marijo Sanes M.D.   On: 04/15/2017 12:13   . Chlorhexidine Gluconate Cloth  6 each Topical Daily  . folic acid  1 mg Per Tube QHS  . heparin subcutaneous  5,000 Units Subcutaneous Q8H  . insulin aspart  0-9 Units Subcutaneous Q4H  . ipratropium-albuterol  3 mL Nebulization Q6H  . mouth rinse  15 mL Mouth Rinse BID  . pantoprazole (PROTONIX) IV  40 mg Intravenous QHS  . scopolamine  1 patch Transdermal Q72H  . sodium chloride flush  10-40 mL Intracatheter Q12H  . thiamine  100 mg Per Tube QHS    BMET    Component Value Date/Time   NA 137 04/17/2017 0414   K 3.9 04/17/2017 0414   CL 104 04/17/2017 0414   CO2 27 04/17/2017 0414   GLUCOSE 118 (H) 04/17/2017 0414   BUN 19 04/17/2017 0414   CREATININE 1.90 (H) 04/17/2017 0414   CALCIUM 7.8 (L) 04/17/2017 0414   GFRNONAA 38 (L) 04/17/2017 0414   GFRAA 45 (L) 04/17/2017 0414   CBC    Component Value Date/Time   WBC 15.5 (H) 04/17/2017 0414   RBC 2.65 (L) 04/17/2017 0414   HGB 8.4 (L) 04/17/2017 0414   HCT 25.2 (L) 04/17/2017 0414   PLT 215 04/17/2017 0414   MCV 95.1 04/17/2017 0414   MCH 31.7 04/17/2017 0414   MCHC 33.3 04/17/2017 0414   RDW 18.1 (H) 04/17/2017 0414   LYMPHSABS 0.8 04/16/2017 0411   MONOABS 1.0 04/16/2017 0411   EOSABS 0.1 04/16/2017 0411   BASOSABS 0.0 04/16/2017 0411     Assessment/Plan:  1. AKI (unknown b/l) - baseline Cr unknown.  Continue CRRT for vol removal for several hours post-extubation, then d/c this afternoon  if going well 2.  Hypokalemia, resolved  3. Anemia of chronic disease - initiated po iron, can give IV when he is off abx 4. Mineral metabolic disorder - calcium wnl when corrected for low albumin, phos WNL  5. VDRF - for extubation 6. Diverticular abscess: s/p drain placed by IR, on unasyn and TF now, TPN off 7. Hypophosphatemia: repleting with 30 mmol Na-Phos, expect this to resolve when we d/c CRRT    Madelon Lips MD Anon Raices pgr [phone]

## 2017-04-17 NOTE — Progress Notes (Signed)
[facility] Progress Note Patient Name: Dominic Phillips DOB: Nov 10, 1960 MRN: [id number]   Date of Service  04/17/2017  HPI/Events of Note  Camera check on patient post extubation. Early partial code. Family members at bedside. Tachypneic with saturation 93-94 percent. Patient also sinus tachycardia.   eICU Interventions  Continuing close monitoring postextubation.      Intervention Category Major Interventions: Respiratory failure - evaluation and management  Tera Partridge 04/17/2017, 7:44 PM

## 2017-04-17 NOTE — Care Management Note (Signed)
Case Management Note  Patient Details  Name: Dominic Phillips MRN: [id number] Date of Birth: 05/28/1961  Subjective/Objective:    Pt admitted with AKI and ARDS       Action/Plan:  PTA from home.  Pt extubated 04/17/17 but still requiring CRRT.  CSW consulted for substance abuse and potential placement.  CM will continue to follow for discharge needs   Expected Discharge Date:                  Expected Discharge Plan:  [place]  In-House Referral:  Clinical Social Work  Discharge planning Services  CM Consult  Post Acute Care Choice:    Choice offered to:     DME Arranged:    DME Agency:     HH Arranged:    [place] Agency:     Status of Service:     If discussed at [employer], dates discussed:    Additional Comments:  Maryclare Labrador, RN 04/17/2017, 3:35 PM

## 2017-04-17 NOTE — Progress Notes (Signed)
[facility] Progress Note Patient Name: Dominic Phillips DOB: [birth year] MRN: [id number]   Date of Service  04/17/2017  HPI/Events of Note  Notified by bedside nurse. Patient with short run of tachycardia to the 180s. Currently off renal replacement therapy. Patient asymptomatic per nurse. Blood pressure currently stable. Telemetry reviewed and showing mild sinus tachycardia.   eICU Interventions  1. Ordering stat BMP & magnesium 2. Continuing telemetry monitoring      Intervention Category Major Interventions: Arrhythmia - evaluation and management  Tera Partridge 04/17/2017, 11:39 PM

## 2017-04-17 NOTE — Progress Notes (Signed)
PULMONARY / CRITICAL CARE MEDICINE   Name: Dominic Phillips MRN: [id number] DOB: May 14, 1961    ADMISSION DATE:  04/11/2017 CONSULTATION DATE:  04/11/2017  REFERRING MD:  Emeline Gins Elgergawy  CHIEF COMPLAINT:  ARDS secondary to aspiration pneumonia complicating a diverticular abscess  BRIEF  56 year male with etoh history in withdrawal with a liver abscess and SBO on TPN.   Transferred from [facility] for management of ARDS and progressive renal insufficiency. This gentleman has a long history of heavy ETOH abuse (~ 18 beers per day) and presented to [facility] on 04/02/2017 with a c/o of abdominal pain without fevers, leukocytosis or diarrhea. An abd CT revealed pericolonic inflammation/fluid collection, for which he was admitted for diverticular abscess. Was consulted by Gen Surg who suggested placement of a catheter by interventional radiology; subsequent cultures grew E. Coli, for which Zosyn was initiated. Hospital course was complicated by the development of DTs, treated with 1 beer tid and Ativan prn. He slowly improved but worsened on 10/23 with increased WBC and progressive hypoxemia. Repeat abdominal CT showed improving abscess but developing SBO and multilobar pneumonia. Hypoxemia could not be corrected with BiPAP, along with worsening renal function such that he was intubated on 10/25 and required pressors for hemodynamic support. CT chest/abdomen/pelvis on 10/26 showed bilateral pneumonia with volume overload; no change in the RLQ percutaneous drain catheter. With rising creat to 3.1 and 290 cc U/O over 24 hours, transfer was requested.   EVENTS 10/18 - Admit [name]  04/11/2017 - Admit [facility] (at [facility] had Left IJ CVL, RLQ drain and Art line)  SUBJECTIVE/OVERNIGHT/INTERVAL HX Hypothermic overnight though likely related to CRRT. Required restraints overnight for agitation and pulling lines/tubes. Remains lightly sedated on Precedex at 0.586 mcg/kg/hr. Versed every 2-3 hours for  agitation.  On Levofed with stable BP systolics high [id number] 546F.  VITAL SIGNS: BP 98/65   Pulse 75   Temp 97.8 F (36.6 C) (Axillary)   Resp (!) 24   Ht 5\' 11"  (1.803 m)   Wt 157 lb 10.1 oz (71.5 kg)   SpO2 100%   BMI 21.98 kg/m   HEMODYNAMICS:  VENTILATOR SETTINGS: Vent Mode: PRVC FiO2 (%):  [40 %] 40 % Set Rate:  [21 bmp] 21 bmp Vt Set:  [450 mL] 450 mL PEEP:  [5 cmH20] 5 cmH20 Pressure Support:  [10 cmH20] 10 cmH20 Plateau Pressure:  [19 cmH20-25 cmH20] 20 cmH20  INTAKE / OUTPUT: I/O last 3 completed shifts: In: 3553.3 [I.V.:2523; NG/GT:278.3; IV Piggyback:752] Out: 6812 [Urine:508; Emesis/NG output:250; Other:7921]  UOP: 870 cc  PHYSICAL EXAMINATION:  General Appearance:    56 yo male, NAD   Head:    NCAT, ETT in place   Eyes:    PERRL        Nose:   NG tube - with minimal OP  Throat:  ETT TUBE in place   Neck:  Supple,  No enlargement/tenderness/nodules     Lungs:     Coarse, Barrel chest, Synchronous with vent   Chest wall:    No deformity  Heart:    S1 and S2 normal, no MRG   Abdomen:     Mildly distended, no masses, no organomegaly, +BS         Extremities:   Extremities- cachectic      Skin:   Intact in exposed areas     Neurologic:   Sedation - precedex -> RASS score -2   LABS:  LA 0.9  Trop <0.03  Pre-alb <5  ABG -  pH 7.3, pCO2 48.1, bicarb 24.9  Fibrinogen >800  plt 298  INR 15.5 high  APTT 35 wnl Iron 16 low TIBC low 123 Urine Na 60 Phos 3.1 wnl Mag 1.9 wnl   PULMONARY  Recent Labs Lab 04/11/17 1703 04/12/17 0227 04/12/17 0852 04/13/17 0218 04/14/17 0425 04/15/17 0415 04/17/17 0312  PHART 7.304* 7.344* 7.337* 7.321* 7.330* 7.318* 7.342*  PCO2ART 55.0* 48.4* 47.3 48.1* 46.9 45.8 49.8*  PO2ART 103.0 108.0 106.0 179.0* 107 105 134*  HCO3 27.3 26.3 25.3 24.9 24.0 22.8 26.3  TCO2 29 28 27 26   --   --   --   O2SAT 97.0 98.0 98.0 99.0 97.4 97.2 98.4   CBC  Recent Labs Lab 04/15/17 0448 04/16/17 0411 04/17/17 0414   HGB 7.7* 8.2* 8.4*  HCT 23.5* 24.5* 25.2*  WBC 11.4* 14.0* 15.5*  PLT 278 233 215   COAGULATION  Recent Labs Lab 04/13/17 0952  INR 1.24   CARDIAC  Recent Labs Lab 04/13/17 0520  TROPONINI <0.03   No results for input(s): PROBNP in the last 168 hours.  CHEMISTRY  Recent Labs Lab 04/13/17 0520 04/14/17 0333 04/15/17 0448 04/15/17 1700 04/16/17 0411 04/16/17 1505 04/17/17 0414  NA 133* 134* 135 136 137 138 137  K 3.6 3.4* 3.5 3.7 3.9 4.0 3.9  CL 98* 100* 102 103 105 105 104  CO2 25 24 23 24 24 26 27   GLUCOSE 113* 145* 205* 142* 155* 155* 118*  BUN 41* 47* 53* 46* 37* 28* 19  CREATININE 4.56* 4.77* 5.22* 4.18* 3.36* 2.46* 1.90*  CALCIUM 7.8* 7.9* 8.0* 7.9* 7.9* 7.8* 7.8*  MG 1.9 2.0 1.9  --  2.0  --  2.2  PHOS 3.1 3.2 3.1 2.5 2.1* 1.7* 1.9*   Estimated Creatinine Clearance: 44.9 mL/min (A) (by C-G formula based on SCr of 1.9 mg/dL (H)).  LIVER  Recent Labs Lab 04/12/17 0600 04/13/17 0520 04/13/17 0952 04/15/17 1700 04/16/17 0411 04/16/17 1505 04/17/17 0414  AST 44* 44*  --   --  20  --   --   ALT 12* 16*  --   --  11*  --   --   ALKPHOS 201* 188*  --   --  92  --   --   BILITOT 0.7 0.7  --   --  0.4  --   --   PROT 5.8* 5.7*  --   --  5.5*  --   --   ALBUMIN 1.5* 1.3*  --  1.4* 1.4* 1.4* 1.5*  INR  --   --  1.24  --   --   --   --    INFECTIOUS  Recent Labs Lab 04/13/17 0050  LATICACIDVEN 0.9   ENDOCRINE CBG (last 3)   Recent Labs  04/16/17 1931 04/16/17 2323 04/17/17 0315  GLUCAP 84 105* 106*   IMAGING x48h  - image(s) personally visualized  -   highlighted in bold Dg Chest Port 1 View  Result Date: 04/17/2017 CLINICAL DATA:  Intubation. EXAM: PORTABLE CHEST 1 VIEW COMPARISON:  04/15/2017 . FINDINGS: Endotracheal tube, left IJ line, right IJ dual-lumen catheter, NG tube in stable position. Heart size normal. Interim slight improvement of bilateral interstitial prominence and basilar infiltrates/edema. Small right pleural effusion  again noted. No pneumothorax P. IMPRESSION: 1.  Lines and tubes in stable position. 2. Interim slight improvement of bilateral interstitial prominence and basilar infiltrates and edema. Persistent small right pleural effusion . Electronically Signed   By: Marcello Moores  Register   On: 04/17/2017 07:37   Dg Chest Port 1 View  Result Date: 04/15/2017 CLINICAL DATA:  Central line placement. EXAM: PORTABLE CHEST 1 VIEW COMPARISON:  04/15/2017 FINDINGS: Double lumen right IJ catheter tip is just below the carina with the distal port near the cavoatrial junction. The left IJ catheter is stable. The endotracheal tube is stable. The NG tube is stable. Persistent but slightly improved diffuse interstitial and airspace process. Persistent effusions and atelectasis. IMPRESSION: Right IJ catheter in good position without complicating features. Other support apparatus is unchanged. Stable interstitial and airspace process in the lungs along with effusions and atelectasis. Electronically Signed   By: Marijo Sanes M.D.   On: 04/15/2017 12:13   IMAGING CXR 10.28 - Hazy reticulonodular opacities bilaterally, L>R, compatible with the given history of ARDS, possibly some component of pulmonary edema; bibasilar opacities suggesting small layering pleural effusions and/or atelectasis  ECHO 10/28 >> EF 55-60% , no regional wall abnormality  CXR 10/29 >>Stable bilateral diffuse lung densities are noted concerning for edema or inflammation. Mild right pleural effusion. Renal US 10/29>> no obstructive uropathy, borderline increased echogenicity which can be seen with chronic medical renal disease.  Intra-abdominal ascites present and bilateral pleural effusions.   CXR 10/30 >> ARDS pattern is unchanged when compared with the previous exam, diffuse pulmonary edema and moderate sized R pleural effusion  CXR 10/31 right IJ catheter in place, stable interstitial and airspace processes along with effusions and atelectasis CXR 11/2 stable  lines and tubes, slight improvement of b/l interstitial prominence and basilar infiltrates and edema, persistent sm R-pleural effusion  CULTURES: None   ANTIBIOTICS: Zosyn 10/27 > 10/31>>>11/2>>> Unasyn 10/31 >>11/2  LINES/TUBES: ETT 10/25 >>11/2 L IJ 10/25 >> L radial 10/25 > 10/27 Liver drain 04/02/22 >> Foley 10/27 >> NGT 10/27 >>  ASSESSMENT / PLAN:  PULMONARY Seems to have copd at baseline ARDS  - Extubate today - IS per RT protocol - Ambulate - PT - Titrate O2 for sat of 88-92%  - DuoNeb Q6H  CARDIOVASCULAR Circulatory shock - Tele - CVP monitoring - Titrate levophed down as being extubate - ECHO with EF 55-60% , no regional wall abnormality  - On heparin SQ for DVT prophylaxis  RENAL AKI with oliguria (BL Cr unknown) Renal US 10/29 with no obstructive uropathy Hypokalemia resolved - CRRT s/p HD cath 10/31, tolerating well - IVF @ [facility]  - Assess fluid status daily  - Daily BMET  - Replete lytes as needed   GASTROINTESTINAL Diverticular abscess with +/- SBO s/p RLQ drain at [facility] between 04/02/17 and 04/11/17 Rectal tube with OP - Per surgery continue conservative management, holding CT due to improvement of condition, may need resection and colostomy, okay to restart TF - IV Protonix  - TPN per pharmacy, plan to come off 11/2 and begin TF  HEMATOLOGIC  Recent Labs Lab 04/15/17 0448 04/16/17 0411 04/17/17 0414  HGB 7.7* 8.2* 8.4*  HCT 23.5* 24.5* 25.2*  WBC 11.4* 14.0* 15.5*  PLT 278 233 215   Anemia of critical illness Hgb stable 7.7 - Monitor CBC, Hgb stable - Initiated po iron, can give IV when he is off abx  INFECTIOUS Diverticular abscess at [facility] - s/p drain placed by IR  - Afebrile with WBC 15.5 - Abx d/w ID given multiple species - rec Zosyn then switch Unasyn per pharm on CRRT  - Monitor fever curve   ENDOCRINE - No known prior hx  - CBG's 80s-110s - ICU hyperglycemia  protocol  NEUROLOGIC Head CT 10/26: No  intracranial mass, hemorrhage or extra-axial fluid collection ETOH dependency with DT at [facility] - RASS goal: -2 - Continue Precedex gtt - Versed Q2H PRN for agitation  - Wean sedation as able   FAMILY  - Updates:   No family at bedside this morning - will update later today.  Case manager called regarding complex social situation at home and support provided.   Harriet Butte, DO [place] PGY-2   Attending Note:  56 year old alcoholic male with liver abscess that is in respiratory failure and septic shock.  Improving with CRRT negative balance on exam.  CXR that I reviewed myself with ETT is in good position.  Will proceed with extubation today.  IS, PT, OOB, replace electrolytes, ambulate, titrate O2 down as able and will hold in ICU for CRRT.  Hope is to get patient off levophed post extubation and d/c of precedex then transition to standard HD.  Spoke with daughter over the phone, will extubate upon her arrival and transition to LCB with no intubation/trach/peg.  Otherwise full support.  The patient is critically ill with multiple organ systems failure and requires high complexity decision making for assessment and support, frequent evaluation and titration of therapies, application of advanced monitoring technologies and extensive interpretation of multiple databases.   Critical Care Time devoted to patient care services described in this note is  35  Minutes. This time reflects time of care of this signee Dr Jennet Maduro. This critical care time does not reflect procedure time, or teaching time or supervisory time of PA/NP/Med student/Med Resident etc but could involve care discussion time.  Rush Farmer, M.D. [facility] Pulmonary/Critical Care Medicine. Pager: [phone]. After hours pager: [phone].

## 2017-04-17 NOTE — Progress Notes (Signed)
PHARMACY - ADULT TOTAL PARENTERAL NUTRITION CONSULT NOTE   Pharmacy Consult:  TPN Indication:  Prolonged ileus, possible SBO  Patient Measurements: Height: 5\' 11"  (180.3 cm) Weight: 157 lb 10.1 oz (71.5 kg) IBW/kg (Calculated) : 75.3   Body mass index is 21.98 kg/m. *Using TBW on admit - 68 kg  Assessment:  18 YOM with PMH notable for heavy ETOH use and smoking admitted to [facility] on 04/02/17 with abdominal pain and fever.  Found to have diverticular abscess (IR catheter) and course complicated by DTs, possible SBO, PNA progressing into respiratory failure requiring intubation on 04/09/17 and pressors for hemodynamic instability.  Patient was transferred to [facility] on 04/11/17 for ARDS and progressive renal insufficiency.   GI: ileus seems to have resolved although worrisome for possible fistula.  BL prealbumin <5. NGT O/P 193mL, no rectal tube O/P, LBM 11/1.  PPI IV Endo: No hx DM. CBGs tightly controlled Insulin requirements in the past 24 hours: 6 units SSI Lytes: K+ 3.9 (goal >/= 4 for ileus), Phos 1.9 s/p 10 mmol NaPhos yesterday, others WNL Renal: AKI - CRRT started 10/31 - SCr down 1.9, BUN WNL, low UOP at 0.1 ml/kg/hr, net -421mL since admit.  PRN Lasix Pulm: respiratory acidosis, FiO2 40% - Duonebs Cards: MAP 70-80s on Levophed at 3, HR controlled Hepatobil: LFTs ok and Tbili wnl. TG elevated at 203. Neuro: Precedex gtt, PRN Versed.  GCS 9, CPOT 0, RASS at goal -1 ID: Unasyn for IAI/HCAP. Has diverticular abscess now s/p IR drainage. Afebrile, WBC up 15.5 Best Practices: PPI IV, Hep [place] TPN Access: triple lumen CVC placed 04/11/17 TPN start date: 04/09/17 (at [facility], last received lipids on 10/26)   Nutritional Goals (RD rec from 10/29): 1800-1900 kCal and 105-120 gm per day   Current Nutrition:  Customized TPN Vital AF 1.2 at 20 ml/hr (goal 65 ml/hr) = 576 kCal and 36 gm protein per day   Plan:   D/C TPN per PCCM (will advance TF).  Continue TPN until  1800 tonight (finish current bag) Thiamine 100mg  and folic acid 1mg  PT QHS (was in TPN) Reduce SSI to sensitive Q4H  NaPhos 25 mmol IV x 1.  Further management per MD D/C TPN labs and nursing care orders   Kip Kautzman D. [name], PharmD, BCPS Pager:  [phone] 04/17/2017, 10:36 AM

## 2017-04-17 NOTE — Progress Notes (Signed)
Pharmacy Antibiotic Note  Dominic Phillips is a 56 y.o. male admitted on 04/11/2017 with diverticular abscess.  Pharmacy has been consulted to re-broaden to Zosyn dosing. Patient is hypothermic on CRRT. WBC continues to trend up.   Plan: Discontinue Unasyn. Start Zosyn 3.375g IV every 6 hours while on CRRT. Monitor renal function, clinical status, and culture results.   Height: 5\' 11"  (180.3 cm) Weight: 157 lb 10.1 oz (71.5 kg) IBW/kg (Calculated) : 75.3  Temp (24hrs), Avg:97.7 F (36.5 C), Min:97.5 F (36.4 C), Max:97.8 F (36.6 C)   Recent Labs Lab 04/13/17 0050 04/13/17 0520 04/14/17 0333 04/15/17 0448 04/15/17 1700 04/16/17 0411 04/16/17 1505 04/17/17 0414  WBC  --  11.6* 10.6* 11.4*  --  14.0*  --  15.5*  CREATININE 4.34* 4.56* 4.77* 5.22* 4.18* 3.36* 2.46* 1.90*  LATICACIDVEN 0.9  --   --   --   --   --   --   --     Estimated Creatinine Clearance: 44.9 mL/min (A) (by C-G formula based on SCr of 1.9 mg/dL (H)).    No Known Allergies  Antimicrobials this admission: Zosyn 10/28 >> 10/31; 11/2 >> Unasyn 10/31 >>11/2  Dose adjustments this admission:   Microbiology results: 10/27 MRSA PCR - negative  [place] cx: 10/19 Intra-op culture: Ecoli (pan sensitive), Finegoldia magna (formerly peptostreptococcus), Staph saccharolyticus, and Prevotella loescheii  Thank you for allowing pharmacy to be a part of this patient's care.  Sloan Leiter, PharmD, BCPS, BCCCP Clinical Pharmacist Clinical phone 04/17/2017 until 3:30PM [phone] After hours, please call #[phone] 04/17/2017 10:24 AM

## 2017-04-17 NOTE — Progress Notes (Signed)
Nutrition Consult / Follow-up  DOCUMENTATION CODES:   Non-severe (moderate) malnutrition in context of chronic illness  INTERVENTION:    If unable to begin PO diet, resume TF via NGT with Jevity 1.2 at 45 ml/h, increase by 10 ml every 4 hours to goal rate of 75 ml/h to provide 2160 kcal, 100 gm protein, 1458 ml free water daily.  NUTRITION DIAGNOSIS:   Moderate Malnutrition related to chronic illness (ETOH abuse) as evidenced by mild fat depletion, mild muscle depletion.  Ongoing  GOAL:   Patient will meet greater than or equal to 90% of their needs  Progressing  MONITOR:   Vent status, I & O's, Labs  ASSESSMENT:   56 year old male with PMH of ETOH abuse who was admitted to [facility] on 10/18 with diverticular abscess; hospital course complicated by DT's, development of a SBO, and PNA; required intubation on 10/25. He was transferred to [facility] on 10/27 with worsening renal failure, ARDS, aspiration PNA.  Patient was extubated this morning. Hopeful for diet advancement per RN. TPN being discontinued today.  NGT remains in place.  Labs reviewed. Phosphorus 2.1 (L) CBG's: 140-133-116 Medications reviewed.  Diet Order:  Diet NPO time specified TPN ADULT (ION)  EDUCATION NEEDS:   No education needs have been identified at this time  Skin:  Skin Assessment: Skin Integrity Issues: Skin Integrity Issues:: Stage I Stage I: sacrum  Last BM:  11/2 rectal tube  Height:   Ht Readings from Last 1 Encounters:  04/11/17 5\' 11"  (1.803 m)    Weight:   Wt Readings from Last 1 Encounters:  04/17/17 157 lb 10.1 oz (71.5 kg)    Ideal Body Weight:  78.2 kg  BMI:  Body mass index is 21.98 kg/m.  Estimated Nutritional Needs:   Kcal:  1950-2150  Protein:  90-110 gm  Fluid:  2 L    Molli Barrows, RD, LDN, [place] Pager [phone] After Hours Pager [phone]

## 2017-04-17 NOTE — Procedures (Signed)
Extubation Procedure Note  Patient Details:   Name: Dominic Phillips DOB: May 09, 1961 MRN: [id number]   Airway Documentation:     Evaluation  O2 sats: stable throughout Complications: No apparent complications Patient did tolerate procedure well. Bilateral Breath Sounds: Diminished, Rhonchi   Yes  Patient extubated to [place]. Vital signs stable at this time. No complications. RN and family at bedside. RT will continue to monitor.   Mcneil Sober 04/17/2017, 10:55 AM

## 2017-04-17 NOTE — Progress Notes (Signed)
PT Cancellation Note  Patient Details Name: Dominic Phillips MRN: [id number] DOB: [birth year]   Cancelled Treatment:    Reason Eval/Treat Not Completed: Medical issues which prohibited therapy (pt not yet 4 hrs post extubation and will plan to evaluate next date)   Griffin Gerrard B Ardena Gangl 04/17/2017, 12:40 PM  Elwyn Reach, [place]

## 2017-04-17 NOTE — Progress Notes (Signed)
Patient still having tremors with HR into 130s and BP is now 259/256 with multiple BP cuff changes and arm changed multiple times. Precedex drip orders for possible DT's and labetalol drip if Precedex can't bring BP down. Bartholomew Crews, RN 04/17/2017 4:24 PM

## 2017-04-17 NOTE — Progress Notes (Signed)
Subjective/Chief Complaint: More awake on the vent Tolerating trickle feeds    Objective: Vital signs in last 24 hours: Temp:  [97.3 F (36.3 C)-97.8 F (36.6 C)] 97.8 F (36.6 C) (11/02 0319) Pulse Rate:  [61-120] 81 (11/02 0800) Resp:  [12-34] 20 (11/02 0800) BP: (75-139)/(49-90) 101/67 (11/02 0800) SpO2:  [91 %-100 %] 100 % (11/02 0800) FiO2 (%):  [40 %] 40 % (11/02 0800) Weight:  [71.5 kg (157 lb 10.1 oz)] 71.5 kg (157 lb 10.1 oz) (11/02 0400) Last BM Date: 04/17/17  Intake/Output from previous day: 11/01 0701 - 11/02 0700 In: 2283.7 [I.V.:1453.4; NG/GT:278.3; IV Piggyback:552] Out: 5650 [Urine:223; Emesis/NG output:50] Intake/Output this shift: Total I/O In: 50.1 [I.V.:30.1; NG/GT:20] Out: 223 [Urine:20; Other:203]  Exam:   Less abdominal tenderness, having some BM, Drain looks the same, not increasing output  Lab Results:   Recent Labs  04/16/17 0411 04/17/17 0414  WBC 14.0* 15.5*  HGB 8.2* 8.4*  HCT 24.5* 25.2*  PLT 233 215   BMET  Recent Labs  04/16/17 1505 04/17/17 0414  NA 138 137  K 4.0 3.9  CL 105 104  CO2 26 27  GLUCOSE 155* 118*  BUN 28* 19  CREATININE 2.46* 1.90*  CALCIUM 7.8* 7.8*   PT/INR No results for input(s): LABPROT, INR in the last 72 hours. ABG  Recent Labs  04/15/17 0415 04/17/17 0312  PHART 7.318* 7.342*  HCO3 22.8 26.3    Studies/Results: Dg Chest Port 1 View  Result Date: 04/17/2017 CLINICAL DATA:  Intubation. EXAM: PORTABLE CHEST 1 VIEW COMPARISON:  04/15/2017 . FINDINGS: Endotracheal tube, left IJ line, right IJ dual-lumen catheter, NG tube in stable position. Heart size normal. Interim slight improvement of bilateral interstitial prominence and basilar infiltrates/edema. Small right pleural effusion again noted. No pneumothorax P. IMPRESSION: 1.  Lines and tubes in stable position. 2. Interim slight improvement of bilateral interstitial prominence and basilar infiltrates and edema. Persistent small right  pleural effusion . Electronically Signed   By: Marcello Moores  Register   On: 04/17/2017 07:37   Dg Chest Port 1 View  Result Date: 04/15/2017 CLINICAL DATA:  Central line placement. EXAM: PORTABLE CHEST 1 VIEW COMPARISON:  04/15/2017 FINDINGS: Double lumen right IJ catheter tip is just below the carina with the distal port near the cavoatrial junction. The left IJ catheter is stable. The endotracheal tube is stable. The NG tube is stable. Persistent but slightly improved diffuse interstitial and airspace process. Persistent effusions and atelectasis. IMPRESSION: Right IJ catheter in good position without complicating features. Other support apparatus is unchanged. Stable interstitial and airspace process in the lungs along with effusions and atelectasis. Electronically Signed   By: Marijo Sanes M.D.   On: 04/15/2017 12:13    Anti-infectives: Anti-infectives    Start     Dose/Rate Route Frequency Ordered Stop   04/15/17 2125  Ampicillin-Sulbactam (UNASYN) 3 g in sodium chloride 0.9 % 100 mL IVPB     3 g 200 mL/hr over 30 Minutes Intravenous Every 8 hours 04/15/17 1529     04/15/17 1100  ampicillin-sulbactam (UNASYN) 1.5 g in sodium chloride 0.9 % 50 mL IVPB  Status:  Discontinued     1.5 g 100 mL/hr over 30 Minutes Intravenous Every 12 hours 04/15/17 0937 04/15/17 1529   04/13/17 2200  levofloxacin (LEVAQUIN) IVPB 500 mg  Status:  Discontinued     500 mg 100 mL/hr over 60 Minutes Intravenous Every 48 hours 04/12/17 0114 04/12/17 0846   04/13/17 1800  piperacillin-tazobactam (ZOSYN)  IVPB 2.25 g  Status:  Discontinued     2.25 g 100 mL/hr over 30 Minutes Intravenous Every 8 hours 04/13/17 1050 04/15/17 0937   04/12/17 0200  piperacillin-tazobactam (ZOSYN) IVPB 3.375 g  Status:  Discontinued     3.375 g 12.5 mL/hr over 240 Minutes Intravenous Every 8 hours 04/12/17 0103 04/13/17 1050   04/12/17 0000  levofloxacin (LEVAQUIN) IVPB 750 mg  Status:  Discontinued     750 mg 100 mL/hr over 90 Minutes  Intravenous Every 48 hours 04/11/17 2230 04/12/17 0114      Assessment/Plan: s/p * No surgery found *  Diverticulitis with abscess, IR drain, ARDS, CRRT  Ok to advance tube feeds.  Clinically, his exam is improving to will hold on abdominal CT  LOS: 6 days    Dominic Phillips A 04/17/2017

## 2017-04-18 LAB — BASIC METABOLIC PANEL
ANION GAP: 11 (ref 5–15)
Anion gap: 9 (ref 5–15)
BUN: 20 mg/dL (ref 6–20)
BUN: 20 mg/dL (ref 6–20)
CALCIUM: 7.4 mg/dL — AB (ref 8.9–10.3)
CHLORIDE: 99 mmol/L — AB (ref 101–111)
CO2: 25 mmol/L (ref 22–32)
CO2: 25 mmol/L (ref 22–32)
CREATININE: 2.4 mg/dL — AB (ref 0.61–1.24)
Calcium: 7.6 mg/dL — ABNORMAL LOW (ref 8.9–10.3)
Chloride: 99 mmol/L — ABNORMAL LOW (ref 101–111)
Creatinine, Ser: 2.64 mg/dL — ABNORMAL HIGH (ref 0.61–1.24)
GFR, EST AFRICAN AMERICAN: 30 mL/min — AB (ref >60)
GFR, EST AFRICAN AMERICAN: 34 mL/min — AB (ref >60)
GFR, EST NON AFRICAN AMERICAN: 26 mL/min — AB (ref >60)
GFR, EST NON AFRICAN AMERICAN: 29 mL/min — AB (ref >60)
Glucose, Bld: 100 mg/dL — ABNORMAL HIGH (ref 65–99)
Glucose, Bld: 99 mg/dL (ref 65–99)
POTASSIUM: 3.7 mmol/L (ref 3.5–5.1)
Potassium: 3.7 mmol/L (ref 3.5–5.1)
SODIUM: 135 mmol/L (ref 135–145)
Sodium: 133 mmol/L — ABNORMAL LOW (ref 135–145)

## 2017-04-18 LAB — GLUCOSE, CAPILLARY
GLUCOSE-CAPILLARY: 105 mg/dL — AB (ref 65–99)
GLUCOSE-CAPILLARY: 110 mg/dL — AB (ref 65–99)
GLUCOSE-CAPILLARY: 145 mg/dL — AB (ref 65–99)
GLUCOSE-CAPILLARY: 95 mg/dL (ref 65–99)
GLUCOSE-CAPILLARY: 95 mg/dL (ref 65–99)
Glucose-Capillary: 97 mg/dL (ref 65–99)

## 2017-04-18 LAB — CBC
HCT: 23.1 % — ABNORMAL LOW (ref 39.0–52.0)
HEMOGLOBIN: 7.7 g/dL — AB (ref 13.0–17.0)
MCH: 31.2 pg (ref 26.0–34.0)
MCHC: 33.3 g/dL (ref 30.0–36.0)
MCV: 93.5 fL (ref 78.0–100.0)
PLATELETS: 267 10*3/uL (ref 150–400)
RBC: 2.47 MIL/uL — AB (ref 4.22–5.81)
RDW: 17.9 % — ABNORMAL HIGH (ref 11.5–15.5)
WBC: 22.5 10*3/uL — AB (ref 4.0–10.5)

## 2017-04-18 LAB — MAGNESIUM
MAGNESIUM: 1.9 mg/dL (ref 1.7–2.4)
Magnesium: 2 mg/dL (ref 1.7–2.4)

## 2017-04-18 LAB — HEPATITIS B SURFACE ANTIGEN: Hepatitis B Surface Ag: NEGATIVE

## 2017-04-18 LAB — PHOSPHORUS: PHOSPHORUS: 3.9 mg/dL (ref 2.5–4.6)

## 2017-04-18 MED ORDER — LIDOCAINE-PRILOCAINE 2.5-2.5 % EX CREA
1.0000 "application " | TOPICAL_CREAM | CUTANEOUS | Status: DC | PRN
  Filled 2017-04-18: qty 5

## 2017-04-18 MED ORDER — SODIUM CHLORIDE 0.9 % IV SOLN
100.0000 mL | INTRAVENOUS | Status: DC | PRN
Start: 2017-04-18 — End: 2017-04-29

## 2017-04-18 MED ORDER — IPRATROPIUM-ALBUTEROL 0.5-2.5 (3) MG/3ML IN SOLN
3.0000 mL | RESPIRATORY_TRACT | Status: DC | PRN
  Administered 2017-05-03: 3 mL via RESPIRATORY_TRACT

## 2017-04-18 MED ORDER — ALTEPLASE 2 MG IJ SOLR
2.0000 mg | Freq: Once | INTRAMUSCULAR | Status: DC | PRN
  Filled 2017-04-18: qty 2

## 2017-04-18 MED ORDER — PIPERACILLIN-TAZOBACTAM 3.375 G IVPB
3.3750 g | Freq: Three times a day (TID) | INTRAVENOUS | Status: DC
  Filled 2017-04-18: qty 50

## 2017-04-18 MED ORDER — LIDOCAINE HCL (PF) 1 % IJ SOLN: 5.0000 mL | INTRAMUSCULAR | Status: DC | PRN

## 2017-04-18 MED ORDER — FUROSEMIDE 10 MG/ML IJ SOLN
120.0000 mg | Freq: Three times a day (TID) | INTRAVENOUS | Status: DC
  Administered 2017-04-18 (×3): 120 mg via INTRAVENOUS
  Filled 2017-04-18 (×4): qty 12
  Filled 2017-04-18: qty 10
  Filled 2017-04-18: qty 12

## 2017-04-18 MED ORDER — PIPERACILLIN-TAZOBACTAM 3.375 G IVPB
3.3750 g | Freq: Two times a day (BID) | INTRAVENOUS | Status: DC
  Administered 2017-04-18 – 2017-04-22 (×9): 3.375 g via INTRAVENOUS
  Filled 2017-04-18 (×10): qty 50

## 2017-04-18 MED ORDER — FENTANYL CITRATE (PF) 100 MCG/2ML IJ SOLN
INTRAMUSCULAR | Status: AC
  Filled 2017-04-18: qty 2

## 2017-04-18 MED ORDER — PENTAFLUOROPROP-TETRAFLUOROETH EX AERO: 1.0000 "application " | INHALATION_SPRAY | CUTANEOUS | Status: DC | PRN

## 2017-04-18 MED ORDER — HEPARIN SODIUM (PORCINE) 1000 UNIT/ML DIALYSIS: 1000.0000 [IU] | INTRAMUSCULAR | Status: DC | PRN

## 2017-04-18 MED ORDER — ONDANSETRON HCL 4 MG/2ML IJ SOLN
4.0000 mg | Freq: Four times a day (QID) | INTRAMUSCULAR | Status: DC | PRN
  Administered 2017-04-18 – 2017-04-24 (×5): 4 mg via INTRAVENOUS
  Filled 2017-04-18 (×5): qty 2

## 2017-04-18 NOTE — Progress Notes (Signed)
Pt. Transferred to Hemodialysis, Vitals Stable, Pt. Tolerated transfer well. Family aware

## 2017-04-18 NOTE — Progress Notes (Signed)
S: off CRRT, hypertensive and tachy throughout the day.  Tolerated extubation  O:BP (!) 149/81   Pulse (!) 116   Temp 98.4 F (36.9 C) (Oral)   Resp (!) 33   Ht 5\' 11"  (1.803 m)   Wt 68.9 kg (151 lb 14.4 oz)   SpO2 96%   BMI 21.19 kg/m   Intake/Output Summary (Last 24 hours) at 04/18/17 0826 Last data filed at 04/18/17 0500  Gross per 24 hour  Intake           885.75 ml  Output             1940 ml  Net         -1054.25 ml   Intake/Output: I/O last 3 completed shifts: In: 2043.9 [I.V.:870.3; Other:152; NG/GT:281.7; IV Piggyback:740] Out: 4098 [Urine:347; Other:4828]  Intake/Output this shift:  No intake/output data recorded. Weight change: -2.6 kg (-5 lb 11.7 oz) Gen: no acute distress, sitting in bed HEENT: R nontunneled HD cathter in place CVS: RRR, no murmur appreciated  Resp: breath sounds coarse rhonchi but improved JXB:[id number], hypoactive bowel sounds Ext: no peripheral edema, some dependent sacral edema   Recent Labs Lab 04/12/17 0600  04/13/17 0520  04/15/17 0448 04/15/17 1700 04/16/17 0411 04/16/17 1505 04/17/17 0414 04/17/17 1608 04/17/17 2339 04/18/17 0440  NA 132*  < > 133*  < > 135 136 137 138 137 137 133* 135  K 3.7  < > 3.6  < > 3.5 3.7 3.9 4.0 3.9 4.2 3.7 3.7  CL 95*  < > 98*  < > 102 103 105 105 104 102 99* 99*  CO2 26  < > 25  < > 23 24 24 26 27 27 25 25   GLUCOSE 139*  < > 113*  < > 205* 142* 155* 155* 118* 102* 99 100*  BUN 35*  < > 41*  < > 53* 46* 37* 28* 19 16 20 20   CREATININE 3.93*  < > 4.56*  < > 5.22* 4.18* 3.36* 2.46* 1.90* 1.84* 2.40* 2.64*  ALBUMIN 1.5*  --  1.3*  --   --  1.4* 1.4* 1.4* 1.5* 1.7*  --   --   CALCIUM 7.9*  < > 7.8*  < > 8.0* 7.9* 7.9* 7.8* 7.8* 7.9* 7.4* 7.6*  PHOS 2.0*  --  3.1  < > 3.1 2.5 2.1* 1.7* 1.9* 2.8  --  3.9  AST 44*  --  44*  --   --   --  20  --   --   --   --   --   ALT 12*  --  16*  --   --   --  11*  --   --   --   --   --   < > = values in this interval not displayed. Liver Function  Tests:  Recent Labs Lab 04/12/17 0600 04/13/17 0520  04/16/17 0411 04/16/17 1505 04/17/17 0414 04/17/17 1608  AST 44* 44*  --  20  --   --   --   ALT 12* 16*  --  11*  --   --   --   ALKPHOS 201* 188*  --  92  --   --   --   BILITOT 0.7 0.7  --  0.4  --   --   --   PROT 5.8* 5.7*  --  5.5*  --   --   --   ALBUMIN 1.5* 1.3*  < > 1.4*  1.4* 1.5* 1.7*  < > = values in this interval not displayed. No results for input(s): LIPASE, AMYLASE in the last 168 hours. No results for input(s): AMMONIA in the last 168 hours. CBC:  Recent Labs Lab 04/14/17 0333 04/15/17 0448 04/16/17 0411 04/17/17 0414 04/18/17 0440  WBC 10.6* 11.4* 14.0* 15.5* 22.5*  NEUTROABS 8.7* 9.2* 12.1*  --   --   HGB 7.5* 7.7* 8.2* 8.4* 7.7*  HCT 22.5* 23.5* 24.5* 25.2* 23.1*  MCV 93.8 94.0 93.9 95.1 93.5  PLT 277 278 233 215 267   Cardiac Enzymes:  Recent Labs Lab 04/13/17 0520  TROPONINI <0.03   CBG:  Recent Labs Lab 04/17/17 1546 04/17/17 2038 04/17/17 2316 04/18/17 0316 04/18/17 0732  GLUCAP 97 118* 98 95 97    Iron Studies:  No results for input(s): IRON, TIBC, TRANSFERRIN, FERRITIN in the last 72 hours. Studies/Results: Dg Chest Port 1 View  Result Date: 04/17/2017 CLINICAL DATA:  Intubation. EXAM: PORTABLE CHEST 1 VIEW COMPARISON:  04/15/2017 . FINDINGS: Endotracheal tube, left IJ line, right IJ dual-lumen catheter, NG tube in stable position. Heart size normal. Interim slight improvement of bilateral interstitial prominence and basilar infiltrates/edema. Small right pleural effusion again noted. No pneumothorax P. IMPRESSION: 1.  Lines and tubes in stable position. 2. Interim slight improvement of bilateral interstitial prominence and basilar infiltrates and edema. Persistent small right pleural effusion . Electronically Signed   By: Marcello Moores  Register   On: 04/17/2017 07:37   . Chlorhexidine Gluconate Cloth  6 each Topical Daily  . folic acid  1 mg Oral QHS  . heparin subcutaneous  5,000  Units Subcutaneous Q8H  . insulin aspart  0-9 Units Subcutaneous Q4H  . ipratropium-albuterol  3 mL Nebulization Q6H  . mouth rinse  15 mL Mouth Rinse BID  . pantoprazole  40 mg Oral QHS  . scopolamine  1 patch Transdermal Q72H  . sodium chloride flush  10-40 mL Intracatheter Q12H  . thiamine  100 mg Oral QHS    BMET    Component Value Date/Time   NA 135 04/18/2017 0440   K 3.7 04/18/2017 0440   CL 99 (L) 04/18/2017 0440   CO2 25 04/18/2017 0440   GLUCOSE 100 (H) 04/18/2017 0440   BUN 20 04/18/2017 0440   CREATININE 2.64 (H) 04/18/2017 0440   CALCIUM 7.6 (L) 04/18/2017 0440   GFRNONAA 26 (L) 04/18/2017 0440   GFRAA 30 (L) 04/18/2017 0440   CBC    Component Value Date/Time   WBC 22.5 (H) 04/18/2017 0440   RBC 2.47 (L) 04/18/2017 0440   HGB 7.7 (L) 04/18/2017 0440   HCT 23.1 (L) 04/18/2017 0440   PLT 267 04/18/2017 0440   MCV 93.5 04/18/2017 0440   MCH 31.2 04/18/2017 0440   MCHC 33.3 04/18/2017 0440   RDW 17.9 (H) 04/18/2017 0440   LYMPHSABS 0.8 04/16/2017 0411   MONOABS 1.0 04/16/2017 0411   EOSABS 0.1 04/16/2017 0411   BASOSABS 0.0 04/16/2017 0411     Assessment/Plan:  1. AKI (unknown b/l) - baseline Cr unknown.  CRRT discontinued yesterday, still a lot of volume- IHD today 2. Hypokalemia, resolved  3. Anemia of chronic disease - initiated po iron, can give IV when he is off abx 4. VDRF - for extubation 5. Diverticular abscess: s/p drain placed by IR, on unasyn and TF now, TPN off; for repeat CT today 6. Hypophosphatemia: resolved   Madelon Lips MD [facility] pgr [phone]

## 2017-04-18 NOTE — Progress Notes (Signed)
Subjective/Chief Complaint: Awake , appropriate, extubated, some abd pain having stool   Objective: Vital signs in last 24 hours: Temp:  [97.5 F (36.4 C)-100.8 F (38.2 C)] 98.4 F (36.9 C) (11/03 0733) Pulse Rate:  [75-133] 116 (11/03 0600) Resp:  [17-38] 33 (11/03 0600) BP: (100-157)/(63-134) 149/81 (11/03 0600) SpO2:  [89 %-100 %] 96 % (11/03 0600) FiO2 (%):  [4 %-40 %] 4 % (11/02 1200) Weight:  [68.9 kg (151 lb 14.4 oz)] 68.9 kg (151 lb 14.4 oz) (11/03 0452) Last BM Date: 04/17/17  Intake/Output from previous day: 11/02 0701 - 11/03 0700 In: 935.9 [I.V.:370.2; NG/GT:41.7; IV Piggyback:372] Out: 2163 [Urine:257] Intake/Output this shift: No intake/output data recorded.  GI: mild distended minimally tender, drain with pus and stool in it  Lab Results:   Recent Labs  04/17/17 0414 04/18/17 0440  WBC 15.5* 22.5*  HGB 8.4* 7.7*  HCT 25.2* 23.1*  PLT 215 267   BMET  Recent Labs  04/17/17 2339 04/18/17 0440  NA 133* 135  K 3.7 3.7  CL 99* 99*  CO2 25 25  GLUCOSE 99 100*  BUN 20 20  CREATININE 2.40* 2.64*  CALCIUM 7.4* 7.6*   PT/INR No results for input(s): LABPROT, INR in the last 72 hours. ABG  Recent Labs  04/17/17 0312  PHART 7.342*  HCO3 26.3    Studies/Results: Dg Chest Port 1 View  Result Date: 04/17/2017 CLINICAL DATA:  Intubation. EXAM: PORTABLE CHEST 1 VIEW COMPARISON:  04/15/2017 . FINDINGS: Endotracheal tube, left IJ line, right IJ dual-lumen catheter, NG tube in stable position. Heart size normal. Interim slight improvement of bilateral interstitial prominence and basilar infiltrates/edema. Small right pleural effusion again noted. No pneumothorax P. IMPRESSION: 1.  Lines and tubes in stable position. 2. Interim slight improvement of bilateral interstitial prominence and basilar infiltrates and edema. Persistent small right pleural effusion . Electronically Signed   By: Marcello Moores  Register   On: 04/17/2017 07:37     Anti-infectives: Anti-infectives    Start     Dose/Rate Route Frequency Ordered Stop   04/17/17 1200  piperacillin-tazobactam (ZOSYN) IVPB 3.375 g     3.375 g 100 mL/hr over 30 Minutes Intravenous Every 6 hours 04/17/17 1030     04/17/17 1100  piperacillin-tazobactam (ZOSYN) IVPB 3.375 g  Status:  Discontinued     3.375 g 12.5 mL/hr over 240 Minutes Intravenous Every 8 hours 04/17/17 1019 04/17/17 1030   04/15/17 2125  Ampicillin-Sulbactam (UNASYN) 3 g in sodium chloride 0.9 % 100 mL IVPB  Status:  Discontinued     3 g 200 mL/hr over 30 Minutes Intravenous Every 8 hours 04/15/17 1529 04/17/17 1019   04/15/17 1100  ampicillin-sulbactam (UNASYN) 1.5 g in sodium chloride 0.9 % 50 mL IVPB  Status:  Discontinued     1.5 g 100 mL/hr over 30 Minutes Intravenous Every 12 hours 04/15/17 0937 04/15/17 1529   04/13/17 2200  levofloxacin (LEVAQUIN) IVPB 500 mg  Status:  Discontinued     500 mg 100 mL/hr over 60 Minutes Intravenous Every 48 hours 04/12/17 0114 04/12/17 0846   04/13/17 1800  piperacillin-tazobactam (ZOSYN) IVPB 2.25 g  Status:  Discontinued     2.25 g 100 mL/hr over 30 Minutes Intravenous Every 8 hours 04/13/17 1050 04/15/17 0937   04/12/17 0200  piperacillin-tazobactam (ZOSYN) IVPB 3.375 g  Status:  Discontinued     3.375 g 12.5 mL/hr over 240 Minutes Intravenous Every 8 hours 04/12/17 0103 04/13/17 1050   04/12/17 0000  levofloxacin (  LEVAQUIN) IVPB 750 mg  Status:  Discontinued     750 mg 100 mL/hr over 90 Minutes Intravenous Every 48 hours 04/11/17 2230 04/12/17 0114      Assessment/Plan: Diverticulitis with abscess- wbc up today, ct held yesterday, would repeat ct today if ccm thinks ok to do that, not sure of what drain output has been and dont see it recorded, continue abx   [facility] 04/18/2017

## 2017-04-18 NOTE — Progress Notes (Addendum)
PULMONARY / CRITICAL CARE MEDICINE   Name: Dominic Phillips MRN: [id number] DOB: [birth year]    ADMISSION DATE:  04/11/2017 CONSULTATION DATE:  04/11/2017  REFERRING MD:  Emeline Gins Elgergawy  CHIEF COMPLAINT:  ARDS secondary to aspiration pneumonia complicating a diverticular abscess  BRIEF  56 year male with etoh history in withdrawal with a liver abscess and SBO on TPN.   Transferred from [facility] for management of ARDS and progressive renal insufficiency. This gentleman has a long history of heavy ETOH abuse (~ 18 beers per day) and presented to [place] on 04/02/2017 with a c/o of abdominal pain without fevers, leukocytosis or diarrhea. An abd CT revealed pericolonic inflammation/fluid collection, for which he was admitted for diverticular abscess. Was consulted by Gen Surg who suggested placement of a catheter by interventional radiology; subsequent cultures grew E. Coli, for which Zosyn was initiated. Hospital course was complicated by the development of DTs, treated with 1 beer tid and Ativan prn. He slowly improved but worsened on 10/23 with increased WBC and progressive hypoxemia. Repeat abdominal CT showed improving abscess but developing SBO and multilobar pneumonia. Hypoxemia could not be corrected with BiPAP, along with worsening renal function such that he was intubated on 10/25 and required pressors for hemodynamic support. CT chest/abdomen/pelvis on 10/26 showed bilateral pneumonia with volume overload; no change in the RLQ percutaneous drain catheter. With rising creat to 3.1 and 290 cc U/O over 24 hours, transfer was requested.   EVENTS 10/18 - Admit Dominic Phillips  04/11/2017 - Admit Dominic Phillips (at [facility] had Left IJ CVL, RLQ drain and Art line)  SUBJECTIVE/OVERNIGHT/INTERVAL HX No events overnight, extubated and off CRRT  VITAL SIGNS: BP (!) 155/97   Pulse (!) 118   Temp 98.4 F (36.9 C) (Oral)   Resp (!) 23   Ht 5\' 11"  (1.803 m)   Wt 68.9 kg (151 lb 14.4 oz)   SpO2 97%   BMI  21.19 kg/m   HEMODYNAMICS:  VENTILATOR SETTINGS: FiO2 (%):  [4 %] 4 %  INTAKE / OUTPUT: I/O last 3 completed shifts: In: 2043.9 [I.V.:870.3; Other:152; NG/GT:281.7; IV Piggyback:740] Out: 6160 [Urine:347; Other:4828]  UOP: 870 cc  PHYSICAL EXAMINATION:  General Appearance:    56 yo male, NAD   Head:    NCAT, ETT in place   Eyes:    PERRL        Nose:   NG tube - with minimal OP  Throat:  ETT TUBE in place   Neck:  Supple,  No enlargement/tenderness/nodules     Lungs:     Coarse, Barrel chest, Synchronous with vent   Chest wall:    No deformity  Heart:    S1 and S2 normal, no MRG   Abdomen:     Mildly distended, no masses, no organomegaly, +BS         Extremities:   Extremities- cachectic      Skin:   Intact in exposed areas     Neurologic:   Sedation - precedex -> RASS score -2   LABS:  LA 0.9  Trop <0.03  Pre-alb <5  ABG - pH 7.3, pCO2 48.1, bicarb 24.9  Fibrinogen >800  plt 298  INR 15.5 high  APTT 35 wnl Iron 16 low TIBC low 123 Urine Na 60 Phos 3.1 wnl Mag 1.9 wnl  PULMONARY  Recent Labs Lab 04/11/17 1703 04/12/17 0227 04/12/17 0852 04/13/17 0218 04/14/17 0425 04/15/17 0415 04/17/17 0312  PHART 7.304* 7.344* 7.337* 7.321* 7.330* 7.318* 7.342*  PCO2ART 55.0* 48.4*  47.3 48.1* 46.9 45.8 49.8*  PO2ART 103.0 108.0 106.0 179.0* 107 105 134*  HCO3 27.3 26.3 25.3 24.9 24.0 22.8 26.3  TCO2 29 28 27 26   --   --   --   O2SAT 97.0 98.0 98.0 99.0 97.4 97.2 98.4   CBC  Recent Labs Lab 04/16/17 0411 04/17/17 0414 04/18/17 0440  HGB 8.2* 8.4* 7.7*  HCT 24.5* 25.2* 23.1*  WBC 14.0* 15.5* 22.5*  PLT 233 215 267   COAGULATION  Recent Labs Lab 04/13/17 0952  INR 1.24   CARDIAC  Recent Labs Lab 04/13/17 0520  TROPONINI <0.03   No results for input(s): PROBNP in the last 168 hours.  CHEMISTRY  Recent Labs Lab 04/15/17 0448  04/16/17 0411 04/16/17 1505 04/17/17 0414 04/17/17 1608 04/17/17 2339 04/18/17 0440  NA 135  < > 137 138  137 137 133* 135  K 3.5  < > 3.9 4.0 3.9 4.2 3.7 3.7  CL 102  < > 105 105 104 102 99* 99*  CO2 23  < > 24 26 27 27 25 25   GLUCOSE 205*  < > 155* 155* 118* 102* 99 100*  BUN 53*  < > 37* 28* 19 16 20 20   CREATININE 5.22*  < > 3.36* 2.46* 1.90* 1.84* 2.40* 2.64*  CALCIUM 8.0*  < > 7.9* 7.8* 7.8* 7.9* 7.4* 7.6*  MG 1.9  --  2.0  --  2.2  --  2.0 1.9  PHOS 3.1  < > 2.1* 1.7* 1.9* 2.8  --  3.9  < > = values in this interval not displayed. Estimated Creatinine Clearance: 31.2 mL/min (A) (by C-G formula based on SCr of 2.64 mg/dL (H)).  LIVER  Recent Labs Lab 04/12/17 0600 04/13/17 0520 04/13/17 0952 04/15/17 1700 04/16/17 0411 04/16/17 1505 04/17/17 0414 04/17/17 1608  AST 44* 44*  --   --  20  --   --   --   ALT 12* 16*  --   --  11*  --   --   --   ALKPHOS 201* 188*  --   --  92  --   --   --   BILITOT 0.7 0.7  --   --  0.4  --   --   --   PROT 5.8* 5.7*  --   --  5.5*  --   --   --   ALBUMIN 1.5* 1.3*  --  1.4* 1.4* 1.4* 1.5* 1.7*  INR  --   --  1.24  --   --   --   --   --    INFECTIOUS  Recent Labs Lab 04/13/17 0050  LATICACIDVEN 0.9   ENDOCRINE CBG (last 3)   Recent Labs  04/17/17 2316 04/18/17 0316 04/18/17 0732  GLUCAP 98 95 97   IMAGING x48h  - image(s) personally visualized  -   highlighted in bold Dg Chest Port 1 View  Result Date: 04/17/2017 CLINICAL DATA:  Intubation. EXAM: PORTABLE CHEST 1 VIEW COMPARISON:  04/15/2017 . FINDINGS: Endotracheal tube, left IJ line, right IJ dual-lumen catheter, NG tube in stable position. Heart size normal. Interim slight improvement of bilateral interstitial prominence and basilar infiltrates/edema. Small right pleural effusion again noted. No pneumothorax P. IMPRESSION: 1.  Lines and tubes in stable position. 2. Interim slight improvement of bilateral interstitial prominence and basilar infiltrates and edema. Persistent small right pleural effusion . Electronically Signed   By: Marcello Moores  Register   On: 04/17/2017 07:37  IMAGING CXR 10.28 - Hazy reticulonodular opacities bilaterally, L>R, compatible with the given history of ARDS, possibly some component of pulmonary edema; bibasilar opacities suggesting small layering pleural effusions and/or atelectasis  ECHO 10/28 >> EF 55-60% , no regional wall abnormality  CXR 10/29 >>Stable bilateral diffuse lung densities are noted concerning for edema or inflammation. Mild right pleural effusion. Renal US 10/29>> no obstructive uropathy, borderline increased echogenicity which can be seen with chronic medical renal disease.  Intra-abdominal ascites present and bilateral pleural effusions.   CXR 10/30 >> ARDS pattern is unchanged when compared with the previous exam, diffuse pulmonary edema and moderate sized R pleural effusion  CXR 10/31 right IJ catheter in place, stable interstitial and airspace processes along with effusions and atelectasis CXR 11/2 stable lines and tubes, slight improvement of b/l interstitial prominence and basilar infiltrates and edema, persistent sm R-pleural effusion  CULTURES: None   ANTIBIOTICS: Zosyn 10/27 > 10/31>>>11/2>>> Unasyn 10/31 >>11/2  LINES/TUBES: ETT 10/25 >>11/2 L IJ 10/25 >> L radial 10/25 > 10/27 Liver drain 04/02/22 >> Foley 10/27 >> NGT 10/27 >>  I reviewed CXR myself, improving pulmonary edema noted.  ASSESSMENT / PLAN:  PULMONARY Seems to have copd at baseline ARDS  - Extubate - IS per RT protocol - Ambulate - PT - Titrate O2 for sat of 88-92%  - DuoNeb Q6H  CARDIOVASCULAR Circulatory shock - Tele - Titrate levophed down as being extubate - ECHO with EF 55-60% , no regional wall abnormality  - On heparin SQ for DVT prophylaxis  RENAL AKI with oliguria (BL Cr unknown) Renal US 10/29 with no obstructive uropathy Hypokalemia resolved - HD today per renal - IVF @ [facility]  - Assess fluid status daily  - Daily BMET  - Replete lytes as needed   GASTROINTESTINAL Diverticular abscess with +/- SBO s/p  RLQ drain at [facility] between 04/02/17 and 04/11/17 Rectal tube with OP - CT of the abdomen today - Advance diet to heart healthy renal diet - IV Protonix  - TPN per pharmacy, plan to come off 11/2 and begin TF  HEMATOLOGIC  Recent Labs Lab 04/16/17 0411 04/17/17 0414 04/18/17 0440  HGB 8.2* 8.4* 7.7*  HCT 24.5* 25.2* 23.1*  WBC 14.0* 15.5* 22.5*  PLT 233 215 267   Anemia of critical illness Hgb stable  - Monitor CBC, Hgb stable - Initiated po iron, can give IV when he is off abx  INFECTIOUS Diverticular abscess at [facility] - s/p drain placed by IR  - Afebrile with WBC 15.5 - Abx d/w ID given multiple species - rec Zosyn - Monitor fever curve   ENDOCRINE - No known prior hx  - CBG's 80s-110s - ICU hyperglycemia protocol  NEUROLOGIC Head CT 10/26: No intracranial mass, hemorrhage or extra-axial fluid collection ETOH dependency with DT at [facility] - RASS goal: -2 - Continue Precedex gtt - Versed Q2H PRN for agitation  - Wean sedation as able   FAMILY  - Updates:  Family and patient updated bedside  Discussed with TRH-MD and PCCM-NP.  Transfer to SDU and to [facility] service with PCCM off 11/4.  Rush Farmer, M.D. [facility] Pulmonary/Critical Care Medicine. Pager: [phone]. After hours pager: [phone].

## 2017-04-18 NOTE — Progress Notes (Signed)
Pt transferred back to the room. VS stable. C/O abdominal pain and Nausea. MD notified. Pain and nausea medication given. Bed in low position, alarms are on, continue to monitor. Family at the bedside

## 2017-04-18 NOTE — Evaluation (Signed)
Physical Therapy Evaluation Patient Details Name: Dominic Phillips MRN: [id number] DOB: 1960/07/05 Today's Date: 04/18/2017   History of Present Illness  56 year male with etoh history in withdrawal with a liver abscess and SBO on TPN.   Transferred from [facility] for management of ARDS and progressive renal insufficiency. This gentleman has a long history of heavy ETOH abuse (~ 18 beers per day) and presented to [place] on 04/02/2017 with a c/o of abdominal pain without fevers, leukocytosis or diarrhea. An abd CT revealed pericolonic inflammation/fluid collection, for which he was admitted for diverticular abscess. Was consulted by Gen Surg who suggested placement of a catheter by interventional radiology; subsequent cultures grew E. Coli, for which Zosyn was initiated. Hospital course was complicated by the development of DTs, treated with 1 beer tid and Ativan prn. He slowly improved but worsened on 10/23 with increased WBC and progressive hypoxemia. Repeat abdominal CT showed improving abscess but developing SBO and multilobar pneumonia. Hypoxemia could not be corrected with BiPAP, along with worsening renal function such that he was intubated on 10/25 and required pressors for hemodynamic support. CT chest/abdomen/pelvis on 10/26 showed bilateral pneumonia with volume overload; no change in the RLQ percutaneous drain catheter. With rising creat to 3.1 and 290 cc U/O over 24 hours, transfer was requested. Extubated on 04/17/17.    Clinical Impression  Pt admitted with above diagnosis. Pt currently with functional limitations due to the deficits listed below (see PT Problem List). Pt was able to stand with SW with mod assist of 2 persons with pt leaning heavily to right and posterior. Had to follow pt with chair as he took a few steps.  Pt fatigues quickly and HR up to 158 bpm.  O2 on 3L initialy 94%, had to incr to 4LO2 with ambulation to keep sats >90%.  LEft pt in chair on 3LO2 with sats stable.  Pt  was left in recliner with pillows propped on right to facilitate midline as he tends to lean to right in sitting as well.  Will need SNF to recover prior to going home to care for son.  Pt will benefit from skilled PT to increase their independence and safety with mobility to allow discharge to the venue listed below.      Follow Up Recommendations SNF;Supervision/Assistance - 24 hour    Equipment Recommendations  Rolling walker with 5" wheels;3in1 (PT)    Recommendations for Other Services       Precautions / Restrictions Precautions Precautions: Fall Precaution Comments: Drain, Flexiseal, catheter Restrictions Weight Bearing Restrictions: No      Mobility  Bed Mobility Overal bed mobility: Needs Assistance Bed Mobility: Supine to Sit     Supine to sit: Mod assist;+2 for physical assistance     General bed mobility comments: Needs asssit for elevation of trunk and for LEs to bring off bed.   Transfers Overall transfer level: Needs assistance Equipment used: Standard walker Transfers: Sit to/from Stand Sit to Stand: Min assist;Mod assist;+2 physical assistance         General transfer comment: Pt needed assist to power up. Pt needed assist to stand as he was leaning to right.    Ambulation/Gait Ambulation/Gait assistance: Mod assist;+2 physical assistance Ambulation Distance (Feet): 5 Feet Assistive device: Standard walker Gait Pattern/deviations: Decreased step length - right;Decreased step length - left;Decreased stride length;Decreased weight shift to left;Decreased stance time - left;Leaning posteriorly;Staggering right;Drifts right/left;Trunk flexed   Gait velocity interpretation: Below normal speed for age/gender General Gait Details: Pt  was able to take steps with mod assist to control postural stability.  After 5 feet, pt c/o LEs giving out and was flexed at trunk slightly.  Pt needed chair to be brought to him.  Assisted pt to chair with mod assist and cues.     Stairs            Wheelchair Mobility    Modified Rankin (Stroke Patients Only)       Balance Overall balance assessment: Needs assistance Sitting-balance support: Bilateral upper extremity supported;Feet supported Sitting balance-Leahy Scale: Poor Sitting balance - Comments: Pt with right lateral lean and posterior lean needing assist to sit upright.  Postural control: Posterior lean;Right lateral lean Standing balance support: Bilateral upper extremity supported;During functional activity Standing balance-Leahy Scale: Poor Standing balance comment: relies on UE support and external support by PT as he was leaning right and posterior even with max cues.                              Pertinent Vitals/Pain Pain Assessment: Faces Faces Pain Scale: Hurts little more Pain Location: generalized Pain Descriptors / Indicators: Aching;Grimacing;Guarding Pain Intervention(s): Limited activity within patient's tolerance;Monitored during session;Repositioned    Home Living Family/patient expects to be discharged to:: Private residence Living Arrangements: Children (total depended child lives with him) Available Help at Discharge: Friend(s);Family;Available 24 hours/day;Personal care attendant (caregiver, CNA  takes care of son Coralyn Mark who is bedbound) Type of Home: House Home Access: Ramped entrance     Home Layout: One level Home Equipment: None      Prior Function Level of Independence: Independent               Hand Dominance        Extremity/Trunk Assessment   Upper Extremity Assessment Upper Extremity Assessment: Defer to OT evaluation    Lower Extremity Assessment Lower Extremity Assessment: Generalized weakness    Cervical / Trunk Assessment Cervical / Trunk Assessment: Kyphotic  Communication   Communication: No difficulties  Cognition Arousal/Alertness: Awake/alert Behavior During Therapy: Flat affect Overall Cognitive Status: Within  Functional Limits for tasks assessed                                        General Comments      Exercises     Assessment/Plan    PT Assessment Patient needs continued PT services  PT Problem List Decreased activity tolerance;Decreased balance;Decreased mobility;Decreased knowledge of use of DME;Decreased safety awareness;Decreased knowledge of precautions;Pain       PT Treatment Interventions DME instruction;Gait training;Functional mobility training;Therapeutic activities;Therapeutic exercise;Balance training;Patient/family education    PT Goals (Current goals can be found in the Care Plan section)  Acute Rehab PT Goals Patient Stated Goal: to get better PT Goal Formulation: With patient Time For Goal Achievement: 05/02/17 Potential to Achieve Goals: Good    Frequency Min 3X/week   Barriers to discharge   son Coralyn Mark is bedbound and requires 24 hour care    Co-evaluation               AM-PAC PT "6 Clicks" Daily Activity  Outcome Measure Difficulty turning over in bed (including adjusting bedclothes, sheets and blankets)?: A Lot Difficulty moving from lying on back to sitting on the side of the bed? : A Lot Difficulty sitting down on and standing up from a chair with  arms (e.g., wheelchair, bedside commode, etc,.)?: A Lot Help needed moving to and from a bed to chair (including a wheelchair)?: A Lot Help needed walking in hospital room?: A Lot Help needed climbing 3-5 steps with a railing? : Total 6 Click Score: 11    End of Session Equipment Utilized During Treatment: Gait belt;Oxygen Activity Tolerance: Patient limited by fatigue Patient left: in chair;with call bell/phone within reach;with chair alarm set;with family/visitor present Nurse Communication: Mobility status PT Visit Diagnosis: Unsteadiness on feet (R26.81);Muscle weakness (generalized) (M62.81);Other abnormalities of gait and mobility (R26.89)    Time: 9758-8325 PT Time  Calculation (min) (ACUTE ONLY): 36 min   Charges:   PT Evaluation $PT Eval Moderate Complexity: 1 Mod PT Treatments $Gait Training: 8-22 mins   PT G Codes:        Gerardo Territo,PT Acute Rehabilitation [phone] (pager)   Denice Paradise 04/18/2017, 2:26 PM

## 2017-04-18 NOTE — Progress Notes (Signed)
Report given to 2C07  Education given to patient and family regarding use of incentive spirometer and importance of turning patient frequently. Pt. And family verbalized understanding and pt was able to demonstrate appropriate use of Incentive Spirometer.

## 2017-04-19 ENCOUNTER — Inpatient Hospital Stay (HOSPITAL_COMMUNITY): Payer: Medicaid Other

## 2017-04-19 ENCOUNTER — Other Ambulatory Visit: Payer: Self-pay

## 2017-04-19 DIAGNOSIS — K5720 Diverticulitis of large intestine with perforation and abscess without bleeding: Secondary | ICD-10-CM

## 2017-04-19 DIAGNOSIS — L8990 Pressure ulcer of unspecified site, unspecified stage: Secondary | ICD-10-CM

## 2017-04-19 DIAGNOSIS — J449 Chronic obstructive pulmonary disease, unspecified: Secondary | ICD-10-CM

## 2017-04-19 LAB — BASIC METABOLIC PANEL
Anion gap: 9 (ref 5–15)
BUN: 12 mg/dL (ref 6–20)
CHLORIDE: 96 mmol/L — AB (ref 101–111)
CO2: 26 mmol/L (ref 22–32)
CREATININE: 1.99 mg/dL — AB (ref 0.61–1.24)
Calcium: 7.4 mg/dL — ABNORMAL LOW (ref 8.9–10.3)
GFR calc non Af Amer: 36 mL/min — ABNORMAL LOW (ref >60)
GFR, EST AFRICAN AMERICAN: 42 mL/min — AB (ref >60)
Glucose, Bld: 99 mg/dL (ref 65–99)
Potassium: 3.3 mmol/L — ABNORMAL LOW (ref 3.5–5.1)
Sodium: 131 mmol/L — ABNORMAL LOW (ref 135–145)

## 2017-04-19 LAB — PHOSPHORUS: PHOSPHORUS: 4.1 mg/dL (ref 2.5–4.6)

## 2017-04-19 LAB — GLUCOSE, CAPILLARY
GLUCOSE-CAPILLARY: 99 mg/dL (ref 65–99)
Glucose-Capillary: 84 mg/dL (ref 65–99)
Glucose-Capillary: 90 mg/dL (ref 65–99)
Glucose-Capillary: 75 mg/dL (ref 65–99)

## 2017-04-19 LAB — HEPATITIS B CORE ANTIBODY, IGM: Hep B C IgM: NEGATIVE

## 2017-04-19 LAB — CBC
HCT: 21 % — ABNORMAL LOW (ref 39.0–52.0)
HEMOGLOBIN: 7 g/dL — AB (ref 13.0–17.0)
MCH: 31.1 pg (ref 26.0–34.0)
MCHC: 33.3 g/dL (ref 30.0–36.0)
MCV: 93.3 fL (ref 78.0–100.0)
Platelets: 241 10*3/uL (ref 150–400)
RBC: 2.25 MIL/uL — AB (ref 4.22–5.81)
RDW: 17.8 % — ABNORMAL HIGH (ref 11.5–15.5)
WBC: 25.1 10*3/uL — AB (ref 4.0–10.5)

## 2017-04-19 LAB — MAGNESIUM: Magnesium: 1.8 mg/dL (ref 1.7–2.4)

## 2017-04-19 LAB — HEPATITIS B SURFACE ANTIBODY,QUALITATIVE: HEP B S AB: NONREACTIVE

## 2017-04-19 MED ORDER — TRAZODONE HCL 50 MG PO TABS
50.0000 mg | ORAL_TABLET | Freq: Every day | ORAL | Status: DC
  Administered 2017-04-19 – 2017-05-12 (×24): 50 mg via ORAL
  Filled 2017-04-19 (×23): qty 1

## 2017-04-19 MED ORDER — DARBEPOETIN ALFA 200 MCG/0.4ML IJ SOSY
200.0000 ug | PREFILLED_SYRINGE | INTRAMUSCULAR | Status: DC
  Filled 2017-04-19 (×2): qty 0.4

## 2017-04-19 MED ORDER — TRAZODONE HCL 50 MG PO TABS
ORAL_TABLET | ORAL | Status: AC
  Filled 2017-04-19: qty 1

## 2017-04-19 MED ORDER — IOPAMIDOL (ISOVUE-300) INJECTION 61%
INTRAVENOUS | Status: AC
  Filled 2017-04-19: qty 30

## 2017-04-19 NOTE — Progress Notes (Signed)
Patient ID: Dominic Phillips, male   DOB: [birth year], 56 y.o.   MRN: [id number]   Acute Care Surgery Service Progress Note:    Chief Complaint/Subjective: C/o of dilaysis. Reports multiple BMs. Some abdominal discomfort. No n/v  Objective: Vital signs in last 24 hours: Temp:  [97.5 F (36.4 C)-99 F (37.2 C)] 98.1 F (36.7 C) (11/04 0630) Pulse Rate:  [96-136] 123 (11/04 0630) Resp:  [18-37] 18 (11/04 0630) BP: (146-181)/(65-98) 158/84 (11/04 0630) SpO2:  [89 %-100 %] 96 % (11/04 0630) Weight:  [68.9 kg (151 lb 14.4 oz)-70.6 kg (155 lb 10.3 oz)] 68.9 kg (151 lb 14.4 oz) (11/03 1720) Last BM Date: 04/19/17  Intake/Output from previous day: 11/03 0701 - 11/04 0700 In: 191 [P.O.:300; I.V.:130; IV Piggyback:162] Out: 4885 [Urine:2535; Stool:350] Intake/Output this shift: Total I/O In: 120 [P.O.:120] Out: -   Lungs: cta, nonlabored  Cardiovascular: mild tachy  Abd: some distension, not really tender - perc drain with stool in it  Extremities: no edema, +SCDs, cachectic  Neuro: alert, nonfocal  Lab Results: CBC  Recent Labs    04/18/17 0440 04/19/17 0336  WBC 22.5* 25.1*  HGB 7.7* 7.0*  HCT 23.1* 21.0*  PLT 267 241   BMET Recent Labs    04/18/17 0440 04/19/17 0336  NA 135 131*  K 3.7 3.3*  CL 99* 96*  CO2 25 26  GLUCOSE 100* 99  BUN 20 12  CREATININE 2.64* 1.99*  CALCIUM 7.6* 7.4*   LFT Hepatic Function Latest Ref Rng & Units 04/17/2017 04/17/2017 04/16/2017  Total Protein 6.5 - 8.1 g/dL - - -  Albumin 3.5 - 5.0 g/dL 1.7(L) 1.5(L) 1.4(L)  AST 15 - 41 U/L - - -  ALT 17 - 63 U/L - - -  Alk Phosphatase 38 - 126 U/L - - -  Total Bilirubin 0.3 - 1.2 mg/dL - - -   PT/INR No results for input(s): LABPROT, INR in the last 72 hours. ABG Recent Labs    04/17/17 0312  PHART 7.342*  HCO3 26.3    Studies/Results:  Anti-infectives: Anti-infectives (From admission, onward)   Start     Dose/Rate Route Frequency Ordered Stop   04/18/17 1800   piperacillin-tazobactam (ZOSYN) IVPB 3.375 g     3.375 g 12.5 mL/hr over 240 Minutes Intravenous Every 12 hours 04/18/17 1032     04/18/17 1400  piperacillin-tazobactam (ZOSYN) IVPB 3.375 g  Status:  Discontinued     3.375 g 12.5 mL/hr over 240 Minutes Intravenous Every 8 hours 04/18/17 1031 04/18/17 1032   04/17/17 1200  piperacillin-tazobactam (ZOSYN) IVPB 3.375 g  Status:  Discontinued     3.375 g 100 mL/hr over 30 Minutes Intravenous Every 6 hours 04/17/17 1030 04/18/17 1031   04/17/17 1100  piperacillin-tazobactam (ZOSYN) IVPB 3.375 g  Status:  Discontinued     3.375 g 12.5 mL/hr over 240 Minutes Intravenous Every 8 hours 04/17/17 1019 04/17/17 1030   04/15/17 2125  Ampicillin-Sulbactam (UNASYN) 3 g in sodium chloride 0.9 % 100 mL IVPB  Status:  Discontinued     3 g 200 mL/hr over 30 Minutes Intravenous Every 8 hours 04/15/17 1529 04/17/17 1019   04/15/17 1100  ampicillin-sulbactam (UNASYN) 1.5 g in sodium chloride 0.9 % 50 mL IVPB  Status:  Discontinued     1.5 g 100 mL/hr over 30 Minutes Intravenous Every 12 hours 04/15/17 0937 04/15/17 1529   04/13/17 2200  levofloxacin (LEVAQUIN) IVPB 500 mg  Status:  Discontinued     500 mg  100 mL/hr over 60 Minutes Intravenous Every 48 hours 04/12/17 0114 04/12/17 0846   04/13/17 1800  piperacillin-tazobactam (ZOSYN) IVPB 2.25 g  Status:  Discontinued     2.25 g 100 mL/hr over 30 Minutes Intravenous Every 8 hours 04/13/17 1050 04/15/17 0937   04/12/17 0200  piperacillin-tazobactam (ZOSYN) IVPB 3.375 g  Status:  Discontinued     3.375 g 12.5 mL/hr over 240 Minutes Intravenous Every 8 hours 04/12/17 0103 04/13/17 1050   04/12/17 0000  levofloxacin (LEVAQUIN) IVPB 750 mg  Status:  Discontinued     750 mg 100 mL/hr over 90 Minutes Intravenous Every 48 hours 04/11/17 2230 04/12/17 0114      Medications: Scheduled Meds: . Chlorhexidine Gluconate Cloth  6 each Topical Daily  . [START ON 04/20/2017] darbepoetin (ARANESP) injection - DIALYSIS   200 mcg Intravenous Q Mon-HD  . folic acid  1 mg Oral QHS  . heparin subcutaneous  5,000 Units Subcutaneous Q8H  . insulin aspart  0-9 Units Subcutaneous Q4H  . mouth rinse  15 mL Mouth Rinse BID  . pantoprazole  40 mg Oral QHS  . sodium chloride flush  10-40 mL Intracatheter Q12H  . thiamine  100 mg Oral QHS  . traZODone      . traZODone  50 mg Oral QHS   Continuous Infusions: . sodium chloride    . sodium chloride 10 mL/hr at 04/18/17 1300  . sodium chloride    . sodium chloride    . dexmedetomidine (PRECEDEX) IV infusion Stopped (04/17/17 1700)  . feeding supplement (VITAL AF 1.2 CAL) Stopped (04/17/17 0905)  . piperacillin-tazobactam (ZOSYN)  IV 3.375 g (04/19/17 0930)   PRN Meds:.sodium chloride, sodium chloride, sodium chloride, alteplase, fentaNYL (SUBLIMAZE) injection, heparin, heparin, heparin, ipratropium-albuterol, lidocaine (PF), lidocaine-prilocaine, metoprolol tartrate, ondansetron (ZOFRAN) IV, pentafluoroprop-tetrafluoroeth, sodium chloride flush  Assessment/Plan: Patient Active Problem List   Diagnosis Date Noted  . Pressure injury of skin 04/19/2017  . Acute kidney injury ([place])   . Acute respiratory distress syndrome (ARDS) ([place]) 04/11/2017   Diverticulitis with abscess- wbc up today, ct held Friday & yesterday, would repeat ct today if ccm thinks ok to do that, not sure of what drain output has been and dont see it recorded, continue abx  Needs repeat CT abd/pelvis with oral contrast at least.  Record drain output Discussed with pt and family  Disposition:  LOS: 8 days    Leighton Ruff. Redmond Pulling, MD, FACS General, Bariatric, & Minimally Invasive Surgery [phone] [facility]

## 2017-04-19 NOTE — Progress Notes (Signed)
S: Tolerated dialysis yesterday.  Up, eating, talking with family.  O:BP (!) 158/84   Pulse (!) 123   Temp 98.1 F (36.7 C) (Oral)   Resp 18   Ht 5\' 11"  (1.803 m)   Wt 68.9 kg (151 lb 14.4 oz)   SpO2 96%   BMI 21.19 kg/m   Intake/Output Summary (Last 24 hours) at 04/19/2017 0954 Last data filed at 04/19/2017 0900 Gross per 24 hour  Intake 312 ml  Output 4780 ml  Net -4468 ml   Intake/Output: I/O last 3 completed shifts: In: 948 [P.O.:300; I.V.:330; IV Piggyback:212] Out: 5462 [VOJJK:0938; Other:2000; Stool:350]  Intake/Output this shift:  Total I/O In: 120 [P.O.:120] Out: -  Weight change: 1.7 kg (3 lb 12 oz)   Gen: no acute distress, sitting in bed, breakfast tray  HEENT: R nontunneled HD catheter in place CVS: RRR, no murmur appreciated  Resp: breath sounds coarse rhonchi but improved HWE:XHBZJIRCV, hypoactive bowel sounds Ext: no peripheral edema, some dependent sacral edema  Recent Labs  Lab 04/13/17 0520  04/15/17 1700 04/16/17 0411 04/16/17 1505 04/17/17 0414 04/17/17 1608 04/17/17 2339 04/18/17 0440 04/19/17 0336  NA 133*   < > 136 137 138 137 137 133* 135 131*  K 3.6   < > 3.7 3.9 4.0 3.9 4.2 3.7 3.7 3.3*  CL 98*   < > 103 105 105 104 102 99* 99* 96*  CO2 25   < > 24 24 26 27 27 25 25 26   GLUCOSE 113*   < > 142* 155* 155* 118* 102* 99 100* 99  BUN 41*   < > 46* 37* 28* 19 16 20 20 12   CREATININE 4.56*   < > 4.18* 3.36* 2.46* 1.90* 1.84* 2.40* 2.64* 1.99*  ALBUMIN 1.3*  --  1.4* 1.4* 1.4* 1.5* 1.7*  --   --   --   CALCIUM 7.8*   < > 7.9* 7.9* 7.8* 7.8* 7.9* 7.4* 7.6* 7.4*  PHOS 3.1   < > 2.5 2.1* 1.7* 1.9* 2.8  --  3.9 4.1  AST 44*  --   --  20  --   --   --   --   --   --   ALT 16*  --   --  11*  --   --   --   --   --   --    < > = values in this interval not displayed.   Liver Function Tests: Recent Labs  Lab 04/13/17 0520  04/16/17 0411 04/16/17 1505 04/17/17 0414 04/17/17 1608  AST 44*  --  20  --   --   --   ALT 16*  --  11*  --   --    --   ALKPHOS 188*  --  92  --   --   --   BILITOT 0.7  --  0.4  --   --   --   PROT 5.7*  --  5.5*  --   --   --   ALBUMIN 1.3*   < > 1.4* 1.4* 1.5* 1.7*   < > = values in this interval not displayed.   No results for input(s): LIPASE, AMYLASE in the last 168 hours. No results for input(s): AMMONIA in the last 168 hours. CBC: Recent Labs  Lab 04/14/17 0333 04/15/17 0448 04/16/17 0411 04/17/17 0414 04/18/17 0440 04/19/17 0336  WBC 10.6* 11.4* 14.0* 15.5* 22.5* 25.1*  NEUTROABS 8.7* 9.2* 12.1*  --   --   --  HGB 7.5* 7.7* 8.2* 8.4* 7.7* 7.0*  HCT 22.5* 23.5* 24.5* 25.2* 23.1* 21.0*  MCV 93.8 94.0 93.9 95.1 93.5 93.3  PLT 277 278 233 215 267 241   Cardiac Enzymes: Recent Labs  Lab 04/13/17 0520  TROPONINI <0.03   CBG: Recent Labs  Lab 04/18/17 1149 04/18/17 1752 04/18/17 2118 04/19/17 0002 04/19/17 0707  GLUCAP 145* 95 110* 105* 99    Iron Studies:  No results for input(s): IRON, TIBC, TRANSFERRIN, FERRITIN in the last 72 hours. Studies/Results: No results found. . Chlorhexidine Gluconate Cloth  6 each Topical Daily  . folic acid  1 mg Oral QHS  . heparin subcutaneous  5,000 Units Subcutaneous Q8H  . insulin aspart  0-9 Units Subcutaneous Q4H  . mouth rinse  15 mL Mouth Rinse BID  . pantoprazole  40 mg Oral QHS  . sodium chloride flush  10-40 mL Intracatheter Q12H  . thiamine  100 mg Oral QHS  . traZODone      . traZODone  50 mg Oral QHS    BMET    Component Value Date/Time   NA 131 (L) 04/19/2017 0336   K 3.3 (L) 04/19/2017 0336   CL 96 (L) 04/19/2017 0336   CO2 26 04/19/2017 0336   GLUCOSE 99 04/19/2017 0336   BUN 12 04/19/2017 0336   CREATININE 1.99 (H) 04/19/2017 0336   CALCIUM 7.4 (L) 04/19/2017 0336   GFRNONAA 36 (L) 04/19/2017 0336   GFRAA 42 (L) 04/19/2017 0336   CBC    Component Value Date/Time   WBC 25.1 (H) 04/19/2017 0336   RBC 2.25 (L) 04/19/2017 0336   HGB 7.0 (L) 04/19/2017 0336   HCT 21.0 (L) 04/19/2017 0336   PLT 241  04/19/2017 0336   MCV 93.3 04/19/2017 0336   MCH 31.1 04/19/2017 0336   MCHC 33.3 04/19/2017 0336   RDW 17.8 (H) 04/19/2017 0336   LYMPHSABS 0.8 04/16/2017 0411   MONOABS 1.0 04/16/2017 0411   EOSABS 0.1 04/16/2017 0411   BASOSABS 0.0 04/16/2017 0411     Assessment/Plan:  1. AKI (unknown b/l) - baseline Cr unknown.  Initially on CRRT, first IHD yesterday.  Will write IHD orders for tomorrow but will look for signs of renal recovery before bringing to HD unit. 2. Hypokalemia, will use higher K baths 3. Anemia of chronic disease - initiated po iron, Aranesp 200 mcg q Monday 4. VDRF - s/p extubation, resp status improving 5. Diverticular abscess: s/p drain placed by IR, on Zosyn, repeat imaging per primary 6. Hypophosphatemia: resolved   Madelon Lips MD [facility] pgr [phone]

## 2017-04-19 NOTE — Progress Notes (Signed)
PROGRESS NOTE    Dominic Phillips  NAT:[id number] DOB: 21-May-1961 DOA: 04/11/2017 PCP: No primary care provider on file.   Brief Narrative: Dominic Phillips is a 56 y.o. 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   Transferred from [facility] for management of ARDS and progressive renal insufficiency. This gentleman has a long history of heavy ETOH abuse (~ 18 beers per day) and presented to [place] on 04/02/2017 with a c/o of abdominal pain without fevers, leukocytosis or diarrhea. An abd CT revealed pericolonic inflammation/fluid collection, for which he was admitted for diverticular abscess. Was consulted by Gen Surg who suggested placement of a catheter by interventional radiology; subsequent cultures grew E. Coli, for which Zosyn was initiated. Hospital course was complicated by the development of DTs, treated with 1 beer tid and Ativan prn. He slowly improved but worsened on 10/23 with increased WBC and progressive hypoxemia. Repeat abdominal CT showed improving abscess but developing SBO and multilobar pneumonia. Hypoxemia could not be corrected with BiPAP, along with worsening renal function such that he was intubated on 10/25 and required pressors for hemodynamic support. CT chest/abdomen/pelvis on 10/26 showed bilateral pneumonia with volume overload; no change in the RLQ percutaneous drain catheter. With rising creat to 3.1 and 290 cc U/O over 24 hours, transfer was requested.   He required intubation from 10/27 to 11/2 for ARDS and aspiration pneumonia and pressors for circulatory shock in ICU. He was receiving CRRT for AKI and has tolerated IHD x1. Worsening leukocytosis possibly secondary abdominal abscess.   Assessment & Plan:   Active Problems:   Acute respiratory distress syndrome (ARDS) (HCC)   Acute kidney injury ([place])   Pressure injury of skin   Acute respiratory distress syndrome Seen on chest x-ray but possibly secondary to volume overload  in setting of acute kidney injury Patient required intubation from 10/27 to 11/2. Resolved.  Aspiration pneumonia Treated with Zosyn. Improved. Course completed. Mild sputum production which has improved.  Diverticular abscess S/p drain. On zosyn per ID. Leukocytosis worsened. -CT abdomen -continue Zosyn  Leukocytosis Possibly secondary to abdominal abscess. Afebrile. -trend CBC -management per above  Acute kidney injury Patient initially with oliguria requiring CRRT. Had IHD on 11/3 without issue -nephrology recommendations: IHD  COPD Patient states he has quit smoking. Mild cough -continue Duoneb prn  Circulatory shock Patient required Levophed and was weaned off on 04/17/17. Normotensive.  Ethanol abuse Out of window for withdrawals.   DVT prophylaxis: heparin Code Status: Partial code, DNI Family Communication: Daughter and friends at bedside Disposition Plan: When medically stable   Consultants:   General surgery  PCCM  Nephrology  Procedures:   CRRT  IHD  Antimicrobials:  Zosyn (10/27>>10/30; 11/2>>  Augmentin (10/31>>11/1)    Subjective: Intermittent abdominal pain.  Objective: Vitals:   04/19/17 0630 04/19/17 0921 04/19/17 1203 04/19/17 1523  BP: (!) 158/84 (!) 173/91 (!) 160/94 (!) 151/82  Pulse: (!) 123 (!) 111 (!) 117 (!) 113  Resp: 18 15 19 15   Temp: 98.1 F (36.7 C) 98.5 F (36.9 C) 99.1 F (37.3 C) 97.6 F (36.4 C)  TempSrc: Oral Oral Oral Axillary  SpO2: 96% 100% 94% 100%  Weight:      Height:        Intake/Output Summary (Last 24 hours) at 04/19/2017 1535 Last data filed at 04/19/2017 1500 Gross per 24 hour  Intake 602 ml  Output 4775 ml  Net -4173 ml   Filed Weights   04/18/17  4235 04/18/17 1345 04/18/17 1720  Weight: 68.9 kg (151 lb 14.4 oz) 70.6 kg (155 lb 10.3 oz) 68.9 kg (151 lb 14.4 oz)    Examination:  General exam: Appears calm and comfortable Respiratory system: Clear to auscultation, diminished.  Respiratory effort normal. Cardiovascular system: S1 & S2 heard, RRR. No murmurs. Gastrointestinal system: Abdomen is distended, soft and nontender. Normal bowel sounds heard. Tube in RLQ Central nervous system: Alert and oriented. No focal neurological deficits. Extremities: No edema. No calf tenderness Skin: No cyanosis. No rashes Psychiatry: Judgement and insight appear normal. Mood & affect appropriate.     Data Reviewed: I have personally reviewed following labs and imaging studies  CBC: Recent Labs  Lab 04/13/17 0520  04/14/17 0333 04/15/17 0448 04/16/17 0411 04/17/17 0414 04/18/17 0440 04/19/17 0336  WBC 11.6*  --  10.6* 11.4* 14.0* 15.5* 22.5* 25.1*  NEUTROABS 9.9*  --  8.7* 9.2* 12.1*  --   --   --   HGB 7.7*   < > 7.5* 7.7* 8.2* 8.4* 7.7* 7.0*  HCT 23.0*   < > 22.5* 23.5* 24.5* 25.2* 23.1* 21.0*  MCV 94.3  --  93.8 94.0 93.9 95.1 93.5 93.3  PLT 296   < > 277 278 233 215 267 241   < > = values in this interval not displayed.   Basic Metabolic Panel: Recent Labs  Lab 04/16/17 0411 04/16/17 1505 04/17/17 0414 04/17/17 1608 04/17/17 2339 04/18/17 0440 04/19/17 0336  NA 137 138 137 137 133* 135 131*  K 3.9 4.0 3.9 4.2 3.7 3.7 3.3*  CL 105 105 104 102 99* 99* 96*  CO2 24 26 27 27 25 25 26   GLUCOSE 155* 155* 118* 102* 99 100* 99  BUN 37* 28* 19 16 20 20 12   CREATININE 3.36* 2.46* 1.90* 1.84* 2.40* 2.64* 1.99*  CALCIUM 7.9* 7.8* 7.8* 7.9* 7.4* 7.6* 7.4*  MG 2.0  --  2.2  --  2.0 1.9 1.8  PHOS 2.1* 1.7* 1.9* 2.8  --  3.9 4.1   GFR: Estimated Creatinine Clearance: 41.4 mL/min (A) (by C-G formula based on SCr of 1.99 mg/dL (H)). Liver Function Tests: Recent Labs  Lab 04/13/17 0520 04/15/17 1700 04/16/17 0411 04/16/17 1505 04/17/17 0414 04/17/17 1608  AST 44*  --  20  --   --   --   ALT 16*  --  11*  --   --   --   ALKPHOS 188*  --  92  --   --   --   BILITOT 0.7  --  0.4  --   --   --   PROT 5.7*  --  5.5*  --   --   --   ALBUMIN 1.3* 1.4* 1.4* 1.4*  1.5* 1.7*   No results for input(s): LIPASE, AMYLASE in the last 168 hours. No results for input(s): AMMONIA in the last 168 hours. Coagulation Profile: Recent Labs  Lab 04/13/17 0952  INR 1.24   Cardiac Enzymes: Recent Labs  Lab 04/13/17 0520  TROPONINI <0.03   BNP (last 3 results) No results for input(s): PROBNP in the last 8760 hours. HbA1C: No results for input(s): HGBA1C in the last 72 hours. CBG: Recent Labs  Lab 04/18/17 1752 04/18/17 2118 04/19/17 0002 04/19/17 0707 04/19/17 1206  GLUCAP 95 110* 105* 99 84   Lipid Profile: No results for input(s): CHOL, HDL, LDLCALC, TRIG, CHOLHDL, LDLDIRECT in the last 72 hours. Thyroid Function Tests: No results for input(s): TSH, T4TOTAL, FREET4,  T3FREE, THYROIDAB in the last 72 hours. Anemia Panel: No results for input(s): VITAMINB12, FOLATE, FERRITIN, TIBC, IRON, RETICCTPCT in the last 72 hours. Sepsis Labs: Recent Labs  Lab 04/13/17 0050  LATICACIDVEN 0.9    Recent Results (from the past 240 hour(s))  MRSA PCR Screening     Status: None   Collection Time: 04/11/17  3:54 PM  Result Value Ref Range Status   MRSA by PCR NEGATIVE NEGATIVE Final    Comment:        The GeneXpert MRSA Assay (FDA approved for NASAL specimens only), is one component of a comprehensive MRSA colonization surveillance program. It is not intended to diagnose MRSA infection nor to guide or monitor treatment for MRSA infections.          Radiology Studies: No results found.      Scheduled Meds: . Chlorhexidine Gluconate Cloth  6 each Topical Daily  . [START ON 04/20/2017] darbepoetin (ARANESP) injection - DIALYSIS  200 mcg Intravenous Q Mon-HD  . folic acid  1 mg Oral QHS  . heparin subcutaneous  5,000 Units Subcutaneous Q8H  . insulin aspart  0-9 Units Subcutaneous Q4H  . mouth rinse  15 mL Mouth Rinse BID  . pantoprazole  40 mg Oral QHS  . sodium chloride flush  10-40 mL Intracatheter Q12H  . thiamine  100 mg Oral QHS    . traZODone      . traZODone  50 mg Oral QHS   Continuous Infusions: . sodium chloride    . sodium chloride 10 mL/hr at 04/19/17 1500  . sodium chloride    . sodium chloride    . dexmedetomidine (PRECEDEX) IV infusion Stopped (04/17/17 1700)  . feeding supplement (VITAL AF 1.2 CAL) Stopped (04/17/17 0905)  . piperacillin-tazobactam (ZOSYN)  IV Stopped (04/19/17 1330)     LOS: 8 days     Cordelia Poche, MD [facility] 04/19/2017, 3:35 PM Pager: [phone]  If 7PM-7AM, please contact night-coverage [email or web address] Password [email or web address] 04/19/2017, 3:35 PM

## 2017-04-19 NOTE — Progress Notes (Signed)
Patient states he is having anxiety about falling asleep and is restless. Repositioned, and turned lights down for comfort. Page sent to [facility] requesting medication to relieve anxiety and/ or sleep aid.

## 2017-04-19 NOTE — Progress Notes (Signed)
Removed charting of drain as incision and recharted it as a biliary drain so output could be charted on in Intake/Output. Confusion that no one had been charting on output of drain.

## 2017-04-20 DIAGNOSIS — D72829 Elevated white blood cell count, unspecified: Secondary | ICD-10-CM

## 2017-04-20 DIAGNOSIS — F1010 Alcohol abuse, uncomplicated: Secondary | ICD-10-CM

## 2017-04-20 DIAGNOSIS — R0603 Acute respiratory distress: Secondary | ICD-10-CM

## 2017-04-20 DIAGNOSIS — K5720 Diverticulitis of large intestine with perforation and abscess without bleeding: Secondary | ICD-10-CM

## 2017-04-20 DIAGNOSIS — G8918 Other acute postprocedural pain: Secondary | ICD-10-CM

## 2017-04-20 DIAGNOSIS — Z720 Tobacco use: Secondary | ICD-10-CM

## 2017-04-20 DIAGNOSIS — J189 Pneumonia, unspecified organism: Secondary | ICD-10-CM

## 2017-04-20 DIAGNOSIS — D638 Anemia in other chronic diseases classified elsewhere: Secondary | ICD-10-CM

## 2017-04-20 DIAGNOSIS — J80 Acute respiratory distress syndrome: Secondary | ICD-10-CM

## 2017-04-20 DIAGNOSIS — D62 Acute posthemorrhagic anemia: Secondary | ICD-10-CM

## 2017-04-20 DIAGNOSIS — R0682 Tachypnea, not elsewhere classified: Secondary | ICD-10-CM

## 2017-04-20 DIAGNOSIS — R0600 Dyspnea, unspecified: Secondary | ICD-10-CM

## 2017-04-20 DIAGNOSIS — N179 Acute kidney failure, unspecified: Secondary | ICD-10-CM

## 2017-04-20 DIAGNOSIS — R000 Tachycardia, unspecified: Secondary | ICD-10-CM

## 2017-04-20 LAB — GLUCOSE, CAPILLARY
GLUCOSE-CAPILLARY: 74 mg/dL (ref 65–99)
GLUCOSE-CAPILLARY: 86 mg/dL (ref 65–99)
GLUCOSE-CAPILLARY: 76 mg/dL (ref 65–99)
GLUCOSE-CAPILLARY: 75 mg/dL (ref 65–99)
Glucose-Capillary: 65 mg/dL (ref 65–99)
Glucose-Capillary: 71 mg/dL (ref 65–99)
Glucose-Capillary: 75 mg/dL (ref 65–99)

## 2017-04-20 LAB — BASIC METABOLIC PANEL
Anion gap: 12 (ref 5–15)
BUN: 19 mg/dL (ref 6–20)
CHLORIDE: 93 mmol/L — AB (ref 101–111)
CO2: 23 mmol/L (ref 22–32)
Calcium: 7.3 mg/dL — ABNORMAL LOW (ref 8.9–10.3)
Creatinine, Ser: 2.79 mg/dL — ABNORMAL HIGH (ref 0.61–1.24)
GFR calc Af Amer: 28 mL/min — ABNORMAL LOW (ref >60)
GFR calc non Af Amer: 24 mL/min — ABNORMAL LOW (ref >60)
GLUCOSE: 65 mg/dL (ref 65–99)
POTASSIUM: 2.9 mmol/L — AB (ref 3.5–5.1)
Sodium: 128 mmol/L — ABNORMAL LOW (ref 135–145)

## 2017-04-20 LAB — CBC
HCT: 21.8 % — ABNORMAL LOW (ref 39.0–52.0)
HEMOGLOBIN: 7.3 g/dL — AB (ref 13.0–17.0)
MCH: 31.2 pg (ref 26.0–34.0)
MCHC: 33.5 g/dL (ref 30.0–36.0)
MCV: 93.2 fL (ref 78.0–100.0)
Platelets: 254 10*3/uL (ref 150–400)
RBC: 2.34 MIL/uL — AB (ref 4.22–5.81)
RDW: 17.2 % — ABNORMAL HIGH (ref 11.5–15.5)
WBC: 21.2 10*3/uL — ABNORMAL HIGH (ref 4.0–10.5)

## 2017-04-20 MED ORDER — ZOLPIDEM TARTRATE 5 MG PO TABS
10.0000 mg | ORAL_TABLET | Freq: Every evening | ORAL | Status: DC | PRN
  Administered 2017-04-20: 10 mg via ORAL
  Filled 2017-04-20: qty 2

## 2017-04-20 MED ORDER — POTASSIUM CHLORIDE CRYS ER 20 MEQ PO TBCR
40.0000 meq | EXTENDED_RELEASE_TABLET | Freq: Once | ORAL | Status: AC
  Administered 2017-04-20: 40 meq via ORAL
  Filled 2017-04-20: qty 2

## 2017-04-20 MED ORDER — ENSURE ENLIVE PO LIQD
237.0000 mL | Freq: Two times a day (BID) | ORAL | Status: DC
  Administered 2017-04-21 – 2017-04-23 (×4): 237 mL via ORAL

## 2017-04-20 MED ORDER — FUROSEMIDE 10 MG/ML IJ SOLN
120.0000 mg | Freq: Once | INTRAVENOUS | Status: AC
  Administered 2017-04-20: 120 mg via INTRAVENOUS
  Filled 2017-04-20: qty 10

## 2017-04-20 MED ORDER — POTASSIUM CHLORIDE 10 MEQ/50ML IV SOLN
10.0000 meq | INTRAVENOUS | Status: AC
  Administered 2017-04-20 (×3): 10 meq via INTRAVENOUS
  Filled 2017-04-20 (×3): qty 50

## 2017-04-20 NOTE — Progress Notes (Signed)
PROGRESS NOTE    Dominic Phillips  TAV:[id number] DOB: Jun 29, 1960 DOA: 04/11/2017 PCP: No primary care provider on file.   Brief Narrative: Dominic Phillips is a 56 y.o. 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   Transferred from [facility] for management of ARDS and progressive renal insufficiency. This gentleman has a long history of heavy ETOH abuse (~ 18 beers per day) and presented to [place] on 04/02/2017 with a c/o of abdominal pain without fevers, leukocytosis or diarrhea. An abd CT revealed pericolonic inflammation/fluid collection, for which he was admitted for diverticular abscess. Was consulted by Gen Surg who suggested placement of a catheter by interventional radiology; subsequent cultures grew E. Coli, for which Zosyn was initiated. Hospital course was complicated by the development of DTs, treated with 1 beer tid and Ativan prn. He slowly improved but worsened on 10/23 with increased WBC and progressive hypoxemia. Repeat abdominal CT showed improving abscess but developing SBO and multilobar pneumonia. Hypoxemia could not be corrected with BiPAP, along with worsening renal function such that he was intubated on 10/25 and required pressors for hemodynamic support. CT chest/abdomen/pelvis on 10/26 showed bilateral pneumonia with volume overload; no change in the RLQ percutaneous drain catheter. With rising creat to 3.1 and 290 cc U/O over 24 hours, transfer was requested.   He required intubation from 10/27 to 11/2 for ARDS and aspiration pneumonia and pressors for circulatory shock in ICU. He was receiving CRRT for AKI and has tolerated IHD x1. Worsening leukocytosis possibly secondary abdominal abscess.   Assessment & Plan:   Active Problems:   Acute respiratory distress syndrome (ARDS) (HCC)   Acute kidney injury ([place])   Pressure injury of skin   Colonic diverticular abscess   COPD (chronic obstructive pulmonary disease) (HCC)   Acute  respiratory distress syndrome Seen on chest x-ray but possibly secondary to volume overload in setting of acute kidney injury Patient required intubation from 10/27 to 11/2. Resolved.  Bilateral pleural effusions Patient still on oxygen. No respiratory distress. -discussed with nephrology and recommended Lasix 120 mg IV once. Watch UOP.  Aspiration pneumonia Treated with Zosyn. Improved. Course completed. Mild sputum production which has improved.  Diverticular abscess S/p drain. On zosyn per ID. Leukocytosis improved today. CT scan shows minimally improved abscess that is not communicating with drain. -general surgery recommendations -continue Zosyn  Abdominal wound drainage -per general surgery recommendations  Leukocytosis Possibly secondary to abdominal abscess. Afebrile. -trend CBC -management per above  Acute kidney injury Patient initially with oliguria requiring CRRT. Had IHD on 11/3 without issue -nephrology recommendations: IHD, none today  COPD Patient states he has quit smoking. Mild cough -continue Duoneb prn  Circulatory shock Patient required Levophed and was weaned off on 04/17/17. Normotensive.  Ethanol abuse Out of window for withdrawals.  Hypokalemia -supplement with oral potassium  Insomnia -continue trazodone -start Ambien qhs prn   DVT prophylaxis: heparin Code Status: Partial code, DNI Family Communication: Daughter and friends at bedside Disposition Plan: When medically stable   Consultants:   General surgery  PCCM  Nephrology  Procedures:   CRRT  IHD  Antimicrobials:  Zosyn (10/27>>10/30; 11/2>>  Augmentin (10/31>>11/1)    Subjective: Intermittent abdominal pain.  Objective: Vitals:   04/19/17 0921 04/19/17 1203 04/19/17 1523 04/19/17 2000  BP: (!) 173/91 (!) 160/94 (!) 151/82   Pulse: (!) 111 (!) 117 (!) 113   Resp: 15 19 15    Temp: 98.5 F (36.9 C) 99.1 F (37.3 C)  97.6 F (36.4 C) 98.2 F (36.8 C)    TempSrc: Oral Oral Axillary Oral  SpO2: 100% 94% 100%   Weight:      Height:        Intake/Output Summary (Last 24 hours) at 04/20/2017 1134 Last data filed at 04/20/2017 0600 Gross per 24 hour  Intake 540 ml  Output 950 ml  Net -410 ml   Filed Weights   04/18/17 0452 04/18/17 1345 04/18/17 1720  Weight: 68.9 kg (151 lb 14.4 oz) 70.6 kg (155 lb 10.3 oz) 68.9 kg (151 lb 14.4 oz)    Examination:  General exam: Appears calm and comfortable Respiratory system: Clear to auscultation, diminished. Respiratory effort normal. Cardiovascular system: S1 & S2 heard, RRR. No murmurs. Gastrointestinal system: Abdomen is distended, soft and nontender. Normal bowel sounds heard. Tube in RLQ with surrounding green drainage from drain site. Central nervous system: Alert and oriented. No focal neurological deficits. Extremities: No edema. No calf tenderness Skin: No cyanosis. No rashes Psychiatry: Judgement and insight appear normal. Mood & affect appropriate.     Data Reviewed: I have personally reviewed following labs and imaging studies  CBC: Recent Labs  Lab 04/14/17 0333 04/15/17 0448 04/16/17 0411 04/17/17 0414 04/18/17 0440 04/19/17 0336 04/20/17 0444  WBC 10.6* 11.4* 14.0* 15.5* 22.5* 25.1* 21.2*  NEUTROABS 8.7* 9.2* 12.1*  --   --   --   --   HGB 7.5* 7.7* 8.2* 8.4* 7.7* 7.0* 7.3*  HCT 22.5* 23.5* 24.5* 25.2* 23.1* 21.0* 21.8*  MCV 93.8 94.0 93.9 95.1 93.5 93.3 93.2  PLT 277 278 233 215 267 241 196   Basic Metabolic Panel: Recent Labs  Lab 04/16/17 0411 04/16/17 1505 04/17/17 0414 04/17/17 1608 04/17/17 2339 04/18/17 0440 04/19/17 0336 04/20/17 0444  NA 137 138 137 137 133* 135 131* 128*  K 3.9 4.0 3.9 4.2 3.7 3.7 3.3* 2.9*  CL 105 105 104 102 99* 99* 96* 93*  CO2 24 26 27 27 25 25 26 23   GLUCOSE 155* 155* 118* 102* 99 100* 99 65  BUN 37* 28* 19 16 20 20 12 19   CREATININE 3.36* 2.46* 1.90* 1.84* 2.40* 2.64* 1.99* 2.79*  CALCIUM 7.9* 7.8* 7.8* 7.9* 7.4* 7.6*  7.4* 7.3*  MG 2.0  --  2.2  --  2.0 1.9 1.8  --   PHOS 2.1* 1.7* 1.9* 2.8  --  3.9 4.1  --    GFR: Estimated Creatinine Clearance: 29.5 mL/min (A) (by C-G formula based on SCr of 2.79 mg/dL (H)). Liver Function Tests: Recent Labs  Lab 04/15/17 1700 04/16/17 0411 04/16/17 1505 04/17/17 0414 04/17/17 1608  AST  --  20  --   --   --   ALT  --  11*  --   --   --   ALKPHOS  --  92  --   --   --   BILITOT  --  0.4  --   --   --   PROT  --  5.5*  --   --   --   ALBUMIN 1.4* 1.4* 1.4* 1.5* 1.7*   No results for input(s): LIPASE, AMYLASE in the last 168 hours. No results for input(s): AMMONIA in the last 168 hours. Coagulation Profile: No results for input(s): INR, PROTIME in the last 168 hours. Cardiac Enzymes: No results for input(s): CKTOTAL, CKMB, CKMBINDEX, TROPONINI in the last 168 hours. BNP (last 3 results) No results for input(s): PROBNP in the last 8760 hours. HbA1C: No results  for input(s): HGBA1C in the last 72 hours. CBG: Recent Labs  Lab 04/19/17 1602 04/19/17 2039 04/20/17 0126 04/20/17 0404 04/20/17 0828  GLUCAP 90 75 74 65 86   Lipid Profile: No results for input(s): CHOL, HDL, LDLCALC, TRIG, CHOLHDL, LDLDIRECT in the last 72 hours. Thyroid Function Tests: No results for input(s): TSH, T4TOTAL, FREET4, T3FREE, THYROIDAB in the last 72 hours. Anemia Panel: No results for input(s): VITAMINB12, FOLATE, FERRITIN, TIBC, IRON, RETICCTPCT in the last 72 hours. Sepsis Labs: No results for input(s): PROCALCITON, LATICACIDVEN in the last 168 hours.  Recent Results (from the past 240 hour(s))  MRSA PCR Screening     Status: None   Collection Time: 04/11/17  3:54 PM  Result Value Ref Range Status   MRSA by PCR NEGATIVE NEGATIVE Final    Comment:        The GeneXpert MRSA Assay (FDA approved for NASAL specimens only), is one component of a comprehensive MRSA colonization surveillance program. It is not intended to diagnose MRSA infection nor to guide  or monitor treatment for MRSA infections.          Radiology Studies: Ct Abdomen Pelvis Wo Contrast  Result Date: 04/20/2017 CLINICAL DATA:  Abdominal infection EXAM: CT ABDOMEN AND PELVIS WITHOUT CONTRAST TECHNIQUE: Multidetector CT imaging of the abdomen and pelvis was performed following the standard protocol without IV contrast. COMPARISON:  Ultrasound 04/13/2017, radiograph 04/11/2017, 04/10/2017, 04/07/2017, 04/02/2017 FINDINGS: Lower chest: Moderate to large bilateral pleural effusions. Scattered ground-glass densities at the lingula and right middle lobe. Dense right greater than left lower lobe consolidations heart size is nonenlarged. Hepatobiliary: No focal liver abnormality is seen. No gallstones, gallbladder wall thickening, or biliary dilatation. Pancreas: Unremarkable. No pancreatic ductal dilatation or surrounding inflammatory changes. Spleen: Normal in size without focal abnormality. Adrenals/Urinary Tract: Adrenal glands are within normal limits. No hydronephrosis. Foley catheter and air in the urinary bladder. Stomach/Bowel: There is contrast within the stomach. Dilated loops of small bowel containing contrast, these measure up to 3.3 cm. Tapering 2 decompressed distal small bowel in the pelvis. Colon is collapsed. A rectal catheter remains in place. Mild sigmoid colon diverticular disease with minimal wall thickening. Vascular/Lymphatic: Aortic atherosclerosis. No aneurysmal dilatation. No significant adenopathy Reproductive: Prostate is unremarkable. Other: Negative for free air. Right pelvic pigtail drainage catheter re- demonstrated with multiple gas bubbles around the pigtail loop. Slight decrease in size of a small residual fluid collection measuring 2.8 cm in the right pelvis, likely reflecting small residual abscess. Continued moderate ascites within the abdomen and pelvis with small loculated collection along the posterior aspect of the right hepatic lobe. Musculoskeletal:  Degenerative changes. No acute or suspicious bone lesion. IMPRESSION: 1. Stable positioning of the right pelvic drainage catheter. Multiple foci of gas around the pigtail loop but no significant residual abscess collection around the pigtail catheter. Catheter tip is in very close proximity to the sigmoid colon and fistula cannot be ruled out ; direct catheter injection may be helpful for assessment. 2. Slight decrease in size of the residual right pelvic fluid collection/abscess which does not appear to communicate with the right pelvic drain 3. Continued small bowel dilatation with transition to decompressed distal small bowel in the pelvis suggesting bowel obstruction. This does not appear worse in the interim. 4. Moderate amount of ascites within the abdomen and pelvis with some loculated appearance of the ascites adjacent to the inferior right hepatic lobe 5. Moderate to large bilateral pleural effusions with right greater than left lower lobe consolidations,  possible pneumonia Electronically Signed   By: Donavan Foil M.D.   On: 04/20/2017 01:24        Scheduled Meds: . Chlorhexidine Gluconate Cloth  6 each Topical Daily  . darbepoetin (ARANESP) injection - DIALYSIS  200 mcg Intravenous Q Mon-HD  . folic acid  1 mg Oral QHS  . heparin subcutaneous  5,000 Units Subcutaneous Q8H  . insulin aspart  0-9 Units Subcutaneous Q4H  . mouth rinse  15 mL Mouth Rinse BID  . pantoprazole  40 mg Oral QHS  . sodium chloride flush  10-40 mL Intracatheter Q12H  . thiamine  100 mg Oral QHS  . traZODone  50 mg Oral QHS   Continuous Infusions: . sodium chloride    . sodium chloride 10 mL/hr at 04/19/17 1900  . sodium chloride    . sodium chloride    . dexmedetomidine (PRECEDEX) IV infusion Stopped (04/17/17 1700)  . feeding supplement (VITAL AF 1.2 CAL) Stopped (04/17/17 0905)  . furosemide 120 mg (04/20/17 1122)  . piperacillin-tazobactam (ZOSYN)  IV 3.375 g (04/20/17 1123)  . potassium chloride 10  mEq (04/20/17 1124)     LOS: 9 days     Cordelia Poche, MD [facility] 04/20/2017, 11:34 AM Pager: [phone]  If 7PM-7AM, please contact night-coverage [email or web address] Password [email or web address] 04/20/2017, 11:34 AM

## 2017-04-20 NOTE — Progress Notes (Signed)
Inpatient Rehabilitation  Per PT request, patient was screened by Gunnar Fusi for appropriateness for an Inpatient Acute Rehab consult.  At this time we are recommending an Inpatient Rehab consult.  MD text paged to notify; please order if you are agreeable.    Carmelia Roller., CCC/SLP Admission Coordinator  [place]  Cell [phone]

## 2017-04-20 NOTE — Progress Notes (Signed)
Subjective/Chief Complaint: Says he is feeling better. Having bm's   Objective: Vital signs in last 24 hours: Temp:  [97.6 F (36.4 C)-99.1 F (37.3 C)] 98.2 F (36.8 C) (11/04 2000) Pulse Rate:  [111-117] 113 (11/04 1523) Resp:  [15-19] 15 (11/04 1523) BP: (151-173)/(82-94) 151/82 (11/04 1523) SpO2:  [94 %-100 %] 100 % (11/04 1523) Last BM Date: 04/19/17  Intake/Output from previous day: 11/04 0701 - 11/05 0700 In: 770 [P.O.:420; I.V.:300; IV Piggyback:50] Out: 1500 [Urine:1375; Drains:25; Stool:100] Intake/Output this shift: No intake/output data recorded.  General appearance: alert, cooperative and cachectic Resp: clear to auscultation bilaterally Cardio: regular rate and rhythm GI: soft, mild RLQ tenderness. distended. drain output brown  Lab Results:  Recent Labs    04/19/17 0336 04/20/17 0444  WBC 25.1* 21.2*  HGB 7.0* 7.3*  HCT 21.0* 21.8*  PLT 241 254   BMET Recent Labs    04/19/17 0336 04/20/17 0444  NA 131* 128*  K 3.3* 2.9*  CL 96* 93*  CO2 26 23  GLUCOSE 99 65  BUN 12 19  CREATININE 1.99* 2.79*  CALCIUM 7.4* 7.3*   PT/INR No results for input(s): LABPROT, INR in the last 72 hours. ABG No results for input(s): PHART, HCO3 in the last 72 hours.  Invalid input(s): PCO2, PO2  Studies/Results: Ct Abdomen Pelvis Wo Contrast  Result Date: 04/20/2017 CLINICAL DATA:  Abdominal infection EXAM: CT ABDOMEN AND PELVIS WITHOUT CONTRAST TECHNIQUE: Multidetector CT imaging of the abdomen and pelvis was performed following the standard protocol without IV contrast. COMPARISON:  Ultrasound 04/13/2017, radiograph 04/11/2017, 04/10/2017, 04/07/2017, 04/02/2017 FINDINGS: Lower chest: Moderate to large bilateral pleural effusions. Scattered ground-glass densities at the lingula and right middle lobe. Dense right greater than left lower lobe consolidations heart size is nonenlarged. Hepatobiliary: No focal liver abnormality is seen. No gallstones, gallbladder  wall thickening, or biliary dilatation. Pancreas: Unremarkable. No pancreatic ductal dilatation or surrounding inflammatory changes. Spleen: Normal in size without focal abnormality. Adrenals/Urinary Tract: Adrenal glands are within normal limits. No hydronephrosis. Foley catheter and air in the urinary bladder. Stomach/Bowel: There is contrast within the stomach. Dilated loops of small bowel containing contrast, these measure up to 3.3 cm. Tapering 2 decompressed distal small bowel in the pelvis. Colon is collapsed. A rectal catheter remains in place. Mild sigmoid colon diverticular disease with minimal wall thickening. Vascular/Lymphatic: Aortic atherosclerosis. No aneurysmal dilatation. No significant adenopathy Reproductive: Prostate is unremarkable. Other: Negative for free air. Right pelvic pigtail drainage catheter re- demonstrated with multiple gas bubbles around the pigtail loop. Slight decrease in size of a small residual fluid collection measuring 2.8 cm in the right pelvis, likely reflecting small residual abscess. Continued moderate ascites within the abdomen and pelvis with small loculated collection along the posterior aspect of the right hepatic lobe. Musculoskeletal: Degenerative changes. No acute or suspicious bone lesion. IMPRESSION: 1. Stable positioning of the right pelvic drainage catheter. Multiple foci of gas around the pigtail loop but no significant residual abscess collection around the pigtail catheter. Catheter tip is in very close proximity to the sigmoid colon and fistula cannot be ruled out ; direct catheter injection may be helpful for assessment. 2. Slight decrease in size of the residual right pelvic fluid collection/abscess which does not appear to communicate with the right pelvic drain 3. Continued small bowel dilatation with transition to decompressed distal small bowel in the pelvis suggesting bowel obstruction. This does not appear worse in the interim. 4. Moderate amount of  ascites within the abdomen and  pelvis with some loculated appearance of the ascites adjacent to the inferior right hepatic lobe 5. Moderate to large bilateral pleural effusions with right greater than left lower lobe consolidations, possible pneumonia Electronically Signed   By: Donavan Foil M.D.   On: 04/20/2017 01:24    Anti-infectives: Anti-infectives (From admission, onward)   Start     Dose/Rate Route Frequency Ordered Stop   04/18/17 1800  piperacillin-tazobactam (ZOSYN) IVPB 3.375 g     3.375 g 12.5 mL/hr over 240 Minutes Intravenous Every 12 hours 04/18/17 1032     04/18/17 1400  piperacillin-tazobactam (ZOSYN) IVPB 3.375 g  Status:  Discontinued     3.375 g 12.5 mL/hr over 240 Minutes Intravenous Every 8 hours 04/18/17 1031 04/18/17 1032   04/17/17 1200  piperacillin-tazobactam (ZOSYN) IVPB 3.375 g  Status:  Discontinued     3.375 g 100 mL/hr over 30 Minutes Intravenous Every 6 hours 04/17/17 1030 04/18/17 1031   04/17/17 1100  piperacillin-tazobactam (ZOSYN) IVPB 3.375 g  Status:  Discontinued     3.375 g 12.5 mL/hr over 240 Minutes Intravenous Every 8 hours 04/17/17 1019 04/17/17 1030   04/15/17 2125  Ampicillin-Sulbactam (UNASYN) 3 g in sodium chloride 0.9 % 100 mL IVPB  Status:  Discontinued     3 g 200 mL/hr over 30 Minutes Intravenous Every 8 hours 04/15/17 1529 04/17/17 1019   04/15/17 1100  ampicillin-sulbactam (UNASYN) 1.5 g in sodium chloride 0.9 % 50 mL IVPB  Status:  Discontinued     1.5 g 100 mL/hr over 30 Minutes Intravenous Every 12 hours 04/15/17 0937 04/15/17 1529   04/13/17 2200  levofloxacin (LEVAQUIN) IVPB 500 mg  Status:  Discontinued     500 mg 100 mL/hr over 60 Minutes Intravenous Every 48 hours 04/12/17 0114 04/12/17 0846   04/13/17 1800  piperacillin-tazobactam (ZOSYN) IVPB 2.25 g  Status:  Discontinued     2.25 g 100 mL/hr over 30 Minutes Intravenous Every 8 hours 04/13/17 1050 04/15/17 0937   04/12/17 0200  piperacillin-tazobactam (ZOSYN) IVPB 3.375  g  Status:  Discontinued     3.375 g 12.5 mL/hr over 240 Minutes Intravenous Every 8 hours 04/12/17 0103 04/13/17 1050   04/12/17 0000  levofloxacin (LEVAQUIN) IVPB 750 mg  Status:  Discontinued     750 mg 100 mL/hr over 90 Minutes Intravenous Every 48 hours 04/11/17 2230 04/12/17 0114      Assessment/Plan: s/p * No surgery found * Continue zosyn. day 9 of abx  Continue drain.  Wbc improving Perforated diverticulitis Cr elevated and Na and K low. Per medicine  LOS: 9 days    TOTH III,Johnmatthew Solorio S 04/20/2017

## 2017-04-20 NOTE — Consult Note (Signed)
Physical Medicine and Rehabilitation Consult Reason for Consult: Decreased functional mobility Referring Physician: [facility]   HPI: Dominic Phillips is a 56 y.o. right handed male with history of alcohol abuse. Per chart review and patient, patient lives in the area. He is the main caregiver for his 54 year old physically challenged son. He has a daughter in the area with little assistance. Patient independent prior to admission. Presented 04/02/2017 with abdominal pain. Abdominal CT showed pericolonic inflammation/fluid collection. General surgery suggested placement of a catheter by interventional radiology for suspect diverticular abscess. Subsequent cultures grew Escherichia coli for which Zosyn was initiated. Hospital course complicated by DTs. On 04/07/2017 with increased WBCs and progressive hypoxemia. Repeat abdominal CT showed improving abscess but developing small bowel obstruction multilobar pneumonia. CT of chest abdomen and pelvis showed bilateral pneumonia with volume overload no change in the right lower quadrant percutaneous drain catheter. Creatinine increased to 3.1 with baseline unknown. Transferred from [place] for management of ARDS and progressive renal insufficiency 04/11/2017. Patient did require intubation and pressors for circulatory shock. He was receiving CRRT for AKI. He was extubated 04/17/2017. He remained on Zosyn for diverticular abscess. WBC is currently 21,200 with slow improvement. Hospital course acute on chronic anemia 7.3 and monitored. Renal services continue to follow for renal insufficiency patient has been receiving hemodialysis. Subcutaneous heparin for DVT prophylaxis. Physical therapy evaluation completed 04/18/2017 and followed with latest recommendations for physical medicine rehabilitation consult.   Review of Systems  Constitutional: Positive for fever. Negative for chills.  HENT: Negative for hearing loss.   Eyes: Negative for blurred vision  and double vision.  Respiratory: Positive for cough and shortness of breath.   Cardiovascular: Negative for chest pain, palpitations and leg swelling.  Gastrointestinal: Positive for abdominal pain and nausea. Negative for vomiting.  Genitourinary: Negative for dysuria, flank pain and hematuria.  Musculoskeletal: Positive for myalgias.  Skin: Negative for rash.  Neurological: Negative for seizures and weakness.  All other systems reviewed and are negative.  No past medical history (did not receive medical care). No surgical history. No pertinent family history of diverticular disease.  Social History:  reports that he has been smoking cigarettes.  He has been smoking about 3.00 packs per day. he has never used smokeless tobacco. He reports that he drinks about 10.8 oz of alcohol per week. He reports that he does not use drugs. Allergies: No Known Allergies No medications prior to admission.   Home: Home Living Family/patient expects to be discharged to:: Private residence Living Arrangements: Children(total depended child lives with him) Available Help at Discharge: Friend(s), Family, Available 24 hours/day, Personal care attendant(caregiver, CNA  takes care of son Coralyn Mark who is bedbound) Type of Home: House Home Access: Ramped entrance Home Layout: One level Bathroom Shower/Tub: Chiropodist: Standard Home Equipment: None  Functional History: Prior Function Level of Independence: Independent Functional Status:  Mobility: Bed Mobility Overal bed mobility: Needs Assistance Bed Mobility: Supine to Sit Supine to sit: Mod assist General bed mobility comments: pt able to bring LEs off EOB but requires modA for trunk elevation Transfers Overall transfer level: Needs assistance Equipment used: Rolling walker (2 wheeled) Transfers: Sit to/from Stand Sit to Stand: Min assist, +2 physical assistance General transfer comment: pt needed assist for initial power up,  v/c's for hand placement, assist for line management Ambulation/Gait Ambulation/Gait assistance: Min assist, +2 safety/equipment(with episodes of modA to regain balance) Ambulation Distance (Feet): 75 Feet Assistive device: Rolling walker (2 wheeled) Gait  Pattern/deviations: Decreased step length - right, Decreased step length - left, Decreased stride length, Decreased weight shift to left, Decreased stance time - left, Staggering right, Drifts right/left, Narrow base of support General Gait Details: pt with no knee buckling but 2 episodes of LOB to the Right requiring modA to prevent fall. pt's SPO2 decreased into 70s on 2LO2 via [place], pt placed back on 4LO2, SPO2 in 80s Gait velocity: slow Gait velocity interpretation: Below normal speed for age/gender    ADL:    Cognition: Cognition Overall Cognitive Status: Within Functional Limits for tasks assessed Orientation Level: Oriented X4 Cognition Arousal/Alertness: Awake/alert Behavior During Therapy: WFL for tasks assessed/performed Overall Cognitive Status: Within Functional Limits for tasks assessed General Comments: pt motivated to walker and get better  Blood pressure (!) 141/75, pulse (!) 106, temperature 98.3 F (36.8 C), temperature source Oral, resp. rate 14, height 5\' 11"  (1.803 m), weight 68.9 kg (151 lb 14.4 oz), SpO2 100 %. Physical Exam  Vitals reviewed. Constitutional: He is oriented to person, place, and time. He appears well-developed.  56 year old right-handed male appearing older than stated age  HENT:  Head: Normocephalic and atraumatic.  Eyes: EOM are normal. Right eye exhibits no discharge. Left eye exhibits no discharge.  Neck: Normal range of motion. Neck supple. No thyromegaly present.  Cardiovascular: Regular rhythm and normal heart sounds.  +Tachycardia  Respiratory: Effort normal and breath sounds normal. No respiratory distress.  +  GI: Soft. Bowel sounds are normal. He exhibits no distension.    Abdominal drain in place  Genitourinary:  Genitourinary Comments: +Rectal tube  Musculoskeletal: He exhibits no edema or tenderness.  Neurological: He is alert and oriented to person, place, and time.  Motor: 4+/5 grossly throughout  Skin: Skin is warm and dry.  Psychiatric: His mood appears anxious. His speech is tangential.    Results for orders placed or performed during the hospital encounter of 04/11/17 (from the past 24 hour(s))  Glucose, capillary     Status: None   Collection Time: 04/19/17  4:02 PM  Result Value Ref Range   Glucose-Capillary 90 65 - 99 mg/dL  Glucose, capillary     Status: None   Collection Time: 04/19/17  8:39 PM  Result Value Ref Range   Glucose-Capillary 75 65 - 99 mg/dL  Glucose, capillary     Status: None   Collection Time: 04/20/17  1:26 AM  Result Value Ref Range   Glucose-Capillary 74 65 - 99 mg/dL  Glucose, capillary     Status: None   Collection Time: 04/20/17  4:04 AM  Result Value Ref Range   Glucose-Capillary 65 65 - 99 mg/dL  CBC     Status: Abnormal   Collection Time: 04/20/17  4:44 AM  Result Value Ref Range   WBC 21.2 (H) 4.0 - 10.5 K/uL   RBC 2.34 (L) 4.22 - 5.81 MIL/uL   Hemoglobin 7.3 (L) 13.0 - 17.0 g/dL   HCT 21.8 (L) 39.0 - 52.0 %   MCV 93.2 78.0 - 100.0 fL   MCH 31.2 26.0 - 34.0 pg   MCHC 33.5 30.0 - 36.0 g/dL   RDW 17.2 (H) 11.5 - 15.5 %   Platelets 254 150 - 400 K/uL  Basic metabolic panel     Status: Abnormal   Collection Time: 04/20/17  4:44 AM  Result Value Ref Range   Sodium 128 (L) 135 - 145 mmol/L   Potassium 2.9 (L) 3.5 - 5.1 mmol/L   Chloride 93 (L) 101 -  111 mmol/L   CO2 23 22 - 32 mmol/L   Glucose, Bld 65 65 - 99 mg/dL   BUN 19 6 - 20 mg/dL   Creatinine, Ser 2.79 (H) 0.61 - 1.24 mg/dL   Calcium 7.3 (L) 8.9 - 10.3 mg/dL   GFR calc non Af Amer 24 (L) >60 mL/min   GFR calc Af Amer 28 (L) >60 mL/min   Anion gap 12 5 - 15  Glucose, capillary     Status: None   Collection Time: 04/20/17  8:28 AM  Result  Value Ref Range   Glucose-Capillary 86 65 - 99 mg/dL  Glucose, capillary     Status: None   Collection Time: 04/20/17 12:11 PM  Result Value Ref Range   Glucose-Capillary 76 65 - 99 mg/dL   Ct Abdomen Pelvis Wo Contrast  Result Date: 04/20/2017 CLINICAL DATA:  Abdominal infection EXAM: CT ABDOMEN AND PELVIS WITHOUT CONTRAST TECHNIQUE: Multidetector CT imaging of the abdomen and pelvis was performed following the standard protocol without IV contrast. COMPARISON:  Ultrasound 04/13/2017, radiograph 04/11/2017, 04/10/2017, 04/07/2017, 04/02/2017 FINDINGS: Lower chest: Moderate to large bilateral pleural effusions. Scattered ground-glass densities at the lingula and right middle lobe. Dense right greater than left lower lobe consolidations heart size is nonenlarged. Hepatobiliary: No focal liver abnormality is seen. No gallstones, gallbladder wall thickening, or biliary dilatation. Pancreas: Unremarkable. No pancreatic ductal dilatation or surrounding inflammatory changes. Spleen: Normal in size without focal abnormality. Adrenals/Urinary Tract: Adrenal glands are within normal limits. No hydronephrosis. Foley catheter and air in the urinary bladder. Stomach/Bowel: There is contrast within the stomach. Dilated loops of small bowel containing contrast, these measure up to 3.3 cm. Tapering 2 decompressed distal small bowel in the pelvis. Colon is collapsed. A rectal catheter remains in place. Mild sigmoid colon diverticular disease with minimal wall thickening. Vascular/Lymphatic: Aortic atherosclerosis. No aneurysmal dilatation. No significant adenopathy Reproductive: Prostate is unremarkable. Other: Negative for free air. Right pelvic pigtail drainage catheter re- demonstrated with multiple gas bubbles around the pigtail loop. Slight decrease in size of a small residual fluid collection measuring 2.8 cm in the right pelvis, likely reflecting small residual abscess. Continued moderate ascites within the abdomen  and pelvis with small loculated collection along the posterior aspect of the right hepatic lobe. Musculoskeletal: Degenerative changes. No acute or suspicious bone lesion. IMPRESSION: 1. Stable positioning of the right pelvic drainage catheter. Multiple foci of gas around the pigtail loop but no significant residual abscess collection around the pigtail catheter. Catheter tip is in very close proximity to the sigmoid colon and fistula cannot be ruled out ; direct catheter injection may be helpful for assessment. 2. Slight decrease in size of the residual right pelvic fluid collection/abscess which does not appear to communicate with the right pelvic drain 3. Continued small bowel dilatation with transition to decompressed distal small bowel in the pelvis suggesting bowel obstruction. This does not appear worse in the interim. 4. Moderate amount of ascites within the abdomen and pelvis with some loculated appearance of the ascites adjacent to the inferior right hepatic lobe 5. Moderate to large bilateral pleural effusions with right greater than left lower lobe consolidations, possible pneumonia Electronically Signed   By: Donavan Foil M.D.   On: 04/20/2017 01:24    Assessment/Plan: Diagnosis: Debility Labs independently reviewed.  Records reviewed and summated above.  1. Does the need for close, 24 hr/day medical supervision in concert with the patient's rehab needs make it unreasonable for this patient to be served in  a less intensive setting? Yes  2. Co-Morbidities requiring supervision/potential complications: anemia, likely acute on chronic (transfuse if necessary to ensure appropriate perfusion for increased activity tolerance), AKI (avoid nephrotoxic meds, recs per Nephro), ARDS, pneumonia (cont IV Zosyn), DTs (monitor), alcohol/tobacco abuse (counsel), Tachycardia (monitor in accordance with pain and increasing activity), tachypnea (monitor RR and O2 Sats with increased physical exertion),  leukocytosis (cont to monitor for signs and symptoms of infection, further workup if indicated), post-op pain (Biofeedback training with therapies to help reduce reliance on opiate pain medications, particularly IV fentanyl, monitor pain control during therapies, and sedation at rest and titrate to maximum efficacy to ensure participation and gains in therapies) 3. Due to bladder management, bowel management, safety, skin/wound care, disease management, pain management and patient education, does the patient require 24 hr/day rehab nursing? Yes 4. Does the patient require coordinated care of a physician, rehab nurse, PT (1-2 hrs/day, 5 days/week) and OT (1-2 hrs/day, 5 days/week) to address physical and functional deficits in the context of the above medical diagnosis(es)? Yes Addressing deficits in the following areas: balance, endurance, locomotion, strength, transferring, bathing, dressing, toileting, cognition and psychosocial support 5. Can the patient actively participate in an intensive therapy program of at least 3 hrs of therapy per day at least 5 days per week? Potentially 6. The potential for patient to make measurable gains while on inpatient rehab is excellent 7. Anticipated functional outcomes upon discharge from inpatient rehab are modified independent and supervision  with PT, modified independent and supervision with OT, n/a with SLP. 8. Estimated rehab length of stay to reach the above functional goals is: 13-18 days. 9. Anticipated D/C setting: Home 10. Anticipated post D/C treatments: HH therapy and Home excercise program 11. Overall Rehab/Functional Prognosis: good  RECOMMENDATIONS: This patient's condition is appropriate for continued rehabilitative care in the following setting: CIR once medically stable and able to tolerate 3 hours of therapy/day. Patient has agreed to participate in recommended program. Yes Note that insurance prior authorization may be required for  reimbursement for recommended care.  Comment: Rehab Admissions Coordinator to follow up.  Delice Lesch, MD, ABPMR Lauraine Rinne J., PA-C 04/20/2017

## 2017-04-20 NOTE — Progress Notes (Signed)
[facility] ROUNDING NOTE   Subjective:   Interval History:  Appears to be  Doing well this morning no complaints  Objective:  Vital signs in last 24 hours:  Temp:  [97.6 F (36.4 C)-99.1 F (37.3 C)] 98.2 F (36.8 C) (11/04 2000) Pulse Rate:  [111-117] 113 (11/04 1523) Resp:  [15-19] 15 (11/04 1523) BP: (151-173)/(82-94) 151/82 (11/04 1523) SpO2:  [94 %-100 %] 100 % (11/04 1523)  Weight change:  Filed Weights   04/18/17 0452 04/18/17 1345 04/18/17 1720  Weight: 151 lb 14.4 oz (68.9 kg) 155 lb 10.3 oz (70.6 kg) 151 lb 14.4 oz (68.9 kg)    Intake/Output: I/O last 3 completed shifts: In: 22 [P.O.:420; I.V.:300; IV Piggyback:50] Out: 2700 [Urine:2375; Drains:25; Stool:300]    Intake/Output Summary (Last 24 hours) at 04/20/2017 0746 Last data filed at 04/19/2017 1900 Gross per 24 hour  Intake 770 ml  Output 950 ml  Net -180 ml      Intake/Output this shift:  No intake/output data recorded.  Gen: no acute distress, sitting in bed, breakfast tray  HEENT: R nontunneled HD catheter in place CVS: RRR, no murmur appreciated  Resp: breath sounds coarse rhonchi but improved [id number], hypoactive bowel sounds Ext: no peripheral edema, some dependent sacral edema     Basic Metabolic Panel: Recent Labs  Lab 04/16/17 0411 04/16/17 1505 04/17/17 0414 04/17/17 1608 04/17/17 2339 04/18/17 0440 04/19/17 0336 04/20/17 0444  NA 137 138 137 137 133* 135 131* 128*  K 3.9 4.0 3.9 4.2 3.7 3.7 3.3* 2.9*  CL 105 105 104 102 99* 99* 96* 93*  CO2 24 26 27 27 25 25 26 23   GLUCOSE 155* 155* 118* 102* 99 100* 99 65  BUN 37* 28* 19 16 20 20 12 19   CREATININE 3.36* 2.46* 1.90* 1.84* 2.40* 2.64* 1.99* 2.79*  CALCIUM 7.9* 7.8* 7.8* 7.9* 7.4* 7.6* 7.4* 7.3*  MG 2.0  --  2.2  --  2.0 1.9 1.8  --   PHOS 2.1* 1.7* 1.9* 2.8  --  3.9 4.1  --     Liver Function Tests: Recent Labs  Lab 04/15/17 1700 04/16/17 0411 04/16/17 1505 04/17/17 0414 04/17/17 1608  AST   --  20  --   --   --   ALT  --  11*  --   --   --   ALKPHOS  --  92  --   --   --   BILITOT  --  0.4  --   --   --   PROT  --  5.5*  --   --   --   ALBUMIN 1.4* 1.4* 1.4* 1.5* 1.7*   No results for input(s): LIPASE, AMYLASE in the last 168 hours. No results for input(s): AMMONIA in the last 168 hours.  CBC: Recent Labs  Lab 04/14/17 0333 04/15/17 0448 04/16/17 0411 04/17/17 0414 04/18/17 0440 04/19/17 0336 04/20/17 0444  WBC 10.6* 11.4* 14.0* 15.5* 22.5* 25.1* 21.2*  NEUTROABS 8.7* 9.2* 12.1*  --   --   --   --   HGB 7.5* 7.7* 8.2* 8.4* 7.7* 7.0* 7.3*  HCT 22.5* 23.5* 24.5* 25.2* 23.1* 21.0* 21.8*  MCV 93.8 94.0 93.9 95.1 93.5 93.3 93.2  PLT 277 278 233 215 267 241 254    Cardiac Enzymes: No results for input(s): CKTOTAL, CKMB, CKMBINDEX, TROPONINI in the last 168 hours.  BNP: Invalid input(s): POCBNP  CBG: Recent Labs  Lab 04/19/17 1206 04/19/17 1602 04/19/17 2039 04/20/17 0126  04/20/17 0404  GLUCAP 84 90 75 74 60    Microbiology: Results for orders placed or performed during the hospital encounter of 04/11/17  MRSA PCR Screening     Status: None   Collection Time: 04/11/17  3:54 PM  Result Value Ref Range Status   MRSA by PCR NEGATIVE NEGATIVE Final    Comment:        The GeneXpert MRSA Assay (FDA approved for NASAL specimens only), is one component of a comprehensive MRSA colonization surveillance program. It is not intended to diagnose MRSA infection nor to guide or monitor treatment for MRSA infections.     Coagulation Studies: No results for input(s): LABPROT, INR in the last 72 hours.  Urinalysis: No results for input(s): COLORURINE, LABSPEC, PHURINE, GLUCOSEU, HGBUR, BILIRUBINUR, KETONESUR, PROTEINUR, UROBILINOGEN, NITRITE, LEUKOCYTESUR in the last 72 hours.  Invalid input(s): APPERANCEUR    Imaging: Ct Abdomen Pelvis Wo Contrast  Result Date: 04/20/2017 CLINICAL DATA:  Abdominal infection EXAM: CT ABDOMEN AND PELVIS WITHOUT CONTRAST  TECHNIQUE: Multidetector CT imaging of the abdomen and pelvis was performed following the standard protocol without IV contrast. COMPARISON:  Ultrasound 04/13/2017, radiograph 04/11/2017, 04/10/2017, 04/07/2017, 04/02/2017 FINDINGS: Lower chest: Moderate to large bilateral pleural effusions. Scattered ground-glass densities at the lingula and right middle lobe. Dense right greater than left lower lobe consolidations heart size is nonenlarged. Hepatobiliary: No focal liver abnormality is seen. No gallstones, gallbladder wall thickening, or biliary dilatation. Pancreas: Unremarkable. No pancreatic ductal dilatation or surrounding inflammatory changes. Spleen: Normal in size without focal abnormality. Adrenals/Urinary Tract: Adrenal glands are within normal limits. No hydronephrosis. Foley catheter and air in the urinary bladder. Stomach/Bowel: There is contrast within the stomach. Dilated loops of small bowel containing contrast, these measure up to 3.3 cm. Tapering 2 decompressed distal small bowel in the pelvis. Colon is collapsed. A rectal catheter remains in place. Mild sigmoid colon diverticular disease with minimal wall thickening. Vascular/Lymphatic: Aortic atherosclerosis. No aneurysmal dilatation. No significant adenopathy Reproductive: Prostate is unremarkable. Other: Negative for free air. Right pelvic pigtail drainage catheter re- demonstrated with multiple gas bubbles around the pigtail loop. Slight decrease in size of a small residual fluid collection measuring 2.8 cm in the right pelvis, likely reflecting small residual abscess. Continued moderate ascites within the abdomen and pelvis with small loculated collection along the posterior aspect of the right hepatic lobe. Musculoskeletal: Degenerative changes. No acute or suspicious bone lesion. IMPRESSION: 1. Stable positioning of the right pelvic drainage catheter. Multiple foci of gas around the pigtail loop but no significant residual abscess  collection around the pigtail catheter. Catheter tip is in very close proximity to the sigmoid colon and fistula cannot be ruled out ; direct catheter injection may be helpful for assessment. 2. Slight decrease in size of the residual right pelvic fluid collection/abscess which does not appear to communicate with the right pelvic drain 3. Continued small bowel dilatation with transition to decompressed distal small bowel in the pelvis suggesting bowel obstruction. This does not appear worse in the interim. 4. Moderate amount of ascites within the abdomen and pelvis with some loculated appearance of the ascites adjacent to the inferior right hepatic lobe 5. Moderate to large bilateral pleural effusions with right greater than left lower lobe consolidations, possible pneumonia Electronically Signed   By: Donavan Foil M.D.   On: 04/20/2017 01:24     Medications:   . sodium chloride    . sodium chloride 10 mL/hr at 04/19/17 1900  . sodium chloride    .  sodium chloride    . dexmedetomidine (PRECEDEX) IV infusion Stopped (04/17/17 1700)  . feeding supplement (VITAL AF 1.2 CAL) Stopped (04/17/17 0905)  . piperacillin-tazobactam (ZOSYN)  IV 3.375 g (04/20/17 0015)   . Chlorhexidine Gluconate Cloth  6 each Topical Daily  . darbepoetin (ARANESP) injection - DIALYSIS  200 mcg Intravenous Q Mon-HD  . folic acid  1 mg Oral QHS  . heparin subcutaneous  5,000 Units Subcutaneous Q8H  . insulin aspart  0-9 Units Subcutaneous Q4H  . mouth rinse  15 mL Mouth Rinse BID  . pantoprazole  40 mg Oral QHS  . potassium chloride  40 mEq Oral Once  . sodium chloride flush  10-40 mL Intracatheter Q12H  . thiamine  100 mg Oral QHS  . traZODone  50 mg Oral QHS   sodium chloride, sodium chloride, sodium chloride, alteplase, fentaNYL (SUBLIMAZE) injection, heparin, heparin, heparin, ipratropium-albuterol, lidocaine (PF), lidocaine-prilocaine, metoprolol tartrate, ondansetron (ZOFRAN) IV, pentafluoroprop-tetrafluoroeth,  sodium chloride flush, zolpidem  Assessment/ Plan:  1. AKI (unknown b/l) - baseline Cr unknown.  Initially on CRRT, first IHD yesterday. Will continue to monitor for recovery no indication for dialysis 11/5  2. Hypokalemia, will replete  3. Anemia of chronic disease - initiated po iron, Aranesp 200 mcg q Monday 4. VDRF - s/p extubation, resp status improving 5. Diverticular abscess: s/p drain placed by IR, on Zosyn, repeat imaging per primary 6. Hypophosphatemia: resolved      LOS: 9 Jamaurie Bernier W @TODAY @7 :44 AM

## 2017-04-20 NOTE — Progress Notes (Signed)
Inpatient Diabetes Program Recommendations  AACE/ADA: New Consensus Statement on Inpatient Glycemic Control (2015)  Target Ranges:  Prepandial:   less than 140 mg/dL      Peak postprandial:   less than 180 mg/dL (1-2 hours)      Critically ill patients:  140 - 180 mg/dL   Lab Results  Component Value Date   GLUCAP 86 04/20/2017    Review of Glycemic ControlResults for LEOMAR, WESTBERG (MRN [id number]) as of 04/20/2017 10:11  Ref. Range 04/19/2017 16:02 04/19/2017 20:39 04/20/2017 01:26 04/20/2017 04:04 04/20/2017 08:28  Glucose-Capillary Latest Ref Range: 65 - 99 mg/dL 90 75 74 65 86     Inpatient Diabetes Program Recommendations:    Blood sugars < 100 mg/dL.  Consider d/c of Novolog sensitive correction q 4 hours.   Thanks, Adah Perl, RN, BC-ADM Inpatient Diabetes Coordinator Pager [phone] (8a-5p)

## 2017-04-20 NOTE — Progress Notes (Signed)
Physical Therapy Treatment Patient Details Name: Dominic Phillips MRN: [id number] DOB: [birth year] Today's Date: 04/20/2017    History of Present Illness 56 year male with etoh history in withdrawal with a liver abscess and SBO on TPN.   Transferred from [facility] for management of ARDS and progressive renal insufficiency. This gentleman has a long history of heavy ETOH abuse (~ 18 beers per day) and presented to [facility] on 04/02/2017 with a c/o of abdominal pain without fevers, leukocytosis or diarrhea. An abd CT revealed pericolonic inflammation/fluid collection, for which he was admitted for diverticular abscess. Was consulted by Gen Surg who suggested placement of a catheter by interventional radiology; subsequent cultures grew E. Coli, for which Zosyn was initiated. Hospital course was complicated by the development of DTs, treated with 1 beer tid and Ativan prn. He slowly improved but worsened on 10/23 with increased WBC and progressive hypoxemia. Repeat abdominal CT showed improving abscess but developing SBO and multilobar pneumonia. Hypoxemia could not be corrected with BiPAP, along with worsening renal function such that he was intubated on 10/25 and required pressors for hemodynamic support. CT chest/abdomen/pelvis on 10/26 showed bilateral pneumonia with volume overload; no change in the RLQ percutaneous drain catheter. With rising creat to 3.1 and 290 cc U/O over 24 hours, transfer was requested. Extubated on 04/17/17.      PT Comments    Pt much improved from Saturday. Pt able to tolerate 75' of ambulation this date with RW and minAx2. Pt remains to have generalized weakness and impaired balance but is very motivated to return to indep and return home to care for his dependent son. Pt given HEP to address weakness. Pt also with dec SpO2 with mobility as noted in ambulation section. Acute PT to con't to follow.   Follow Up Recommendations  CIR     Equipment Recommendations  Rolling  walker with 5" wheels;3in1 (PT)    Recommendations for Other Services       Precautions / Restrictions Precautions Precautions: Fall Precaution Comments: Drain, Flexiseal, catheter Restrictions Weight Bearing Restrictions: No    Mobility  Bed Mobility Overal bed mobility: Needs Assistance Bed Mobility: Supine to Sit     Supine to sit: Mod assist     General bed mobility comments: pt able to bring LEs off EOB but requires modA for trunk elevation  Transfers Overall transfer level: Needs assistance Equipment used: Rolling walker (2 wheeled) Transfers: Sit to/from Stand Sit to Stand: Min assist;+2 physical assistance         General transfer comment: pt needed assist for initial power up, v/c's for hand placement, assist for line management  Ambulation/Gait Ambulation/Gait assistance: Min assist;+2 safety/equipment(with episodes of modA to regain balance) Ambulation Distance (Feet): 75 Feet Assistive device: Rolling walker (2 wheeled) Gait Pattern/deviations: Decreased step length - right;Decreased step length - left;Decreased stride length;Decreased weight shift to left;Decreased stance time - left;Staggering right;Drifts right/left;Narrow base of support Gait velocity: slow Gait velocity interpretation: Below normal speed for age/gender General Gait Details: pt with no knee buckling but 2 episodes of LOB to the Right requiring modA to prevent fall. pt's SPO2 decreased into 70s on 2LO2 via [place], pt placed back on 4LO2, SPO2 in 80s   Stairs            Wheelchair Mobility    Modified Rankin (Stroke Patients Only)       Balance Overall balance assessment: Needs assistance Sitting-balance support: No upper extremity supported Sitting balance-Leahy Scale: Fair  Standing balance support: Bilateral upper extremity supported;During functional activity Standing balance-Leahy Scale: Poor Standing balance comment: relies on UE support and external support by PT  as he was leaning right and posterior even with max cues.                             Cognition Arousal/Alertness: Awake/alert Behavior During Therapy: WFL for tasks assessed/performed Overall Cognitive Status: Within Functional Limits for tasks assessed                                 General Comments: pt motivated to walker and get better      Exercises General Exercises - Lower Extremity Gluteal Sets: AROM;Both;10 reps;Seated Long Arc Quad: AROM;Both;10 reps;Seated    General Comments        Pertinent Vitals/Pain Pain Assessment: No/denies pain    Home Living                      Prior Function            PT Goals (current goals can now be found in the care plan section) Acute Rehab PT Goals Patient Stated Goal: to get home Progress towards PT goals: Progressing toward goals    Frequency    Min 3X/week      PT Plan Current plan remains appropriate    Co-evaluation              AM-PAC PT "6 Clicks" Daily Activity  Outcome Measure  Difficulty turning over in bed (including adjusting bedclothes, sheets and blankets)?: A Lot Difficulty moving from lying on back to sitting on the side of the bed? : A Lot Difficulty sitting down on and standing up from a chair with arms (e.g., wheelchair, bedside commode, etc,.)?: A Lot Help needed moving to and from a bed to chair (including a wheelchair)?: A Lot Help needed walking in hospital room?: A Lot Help needed climbing 3-5 steps with a railing? : Total 6 Click Score: 11    End of Session Equipment Utilized During Treatment: Gait belt;Oxygen Activity Tolerance: Patient tolerated treatment well Patient left: in chair;with call bell/phone within reach;with chair alarm set;with family/visitor present Nurse Communication: Mobility status PT Visit Diagnosis: Unsteadiness on feet (R26.81);Muscle weakness (generalized) (M62.81);Other abnormalities of gait and mobility (R26.89)      Time: 2956-2130 PT Time Calculation (min) (ACUTE ONLY): 42 min  Charges:  $Gait Training: 23-37 mins $Therapeutic Exercise: 8-22 mins                    G Codes:       Kittie Plater, PT, DPT Pager #: [phone] Office #: [phone]    [place] 04/20/2017, 10:09 AM

## 2017-04-20 NOTE — Progress Notes (Signed)
Nutrition Follow-up  DOCUMENTATION CODES:   Non-severe (moderate) malnutrition in context of chronic illness  INTERVENTION:  - Ensure Enlive po BID, each supplement provides 350 kcal and 20 grams of protein  NUTRITION DIAGNOSIS:   Moderate Malnutrition related to chronic illness(ETOH abuse) as evidenced by mild fat depletion, mild muscle depletion.  Ongoing   GOAL:   Patient will meet greater than or equal to 90% of their needs  Progressing   MONITOR:   PO intake, Supplement acceptance, Labs, I & O's, Weight trends  ASSESSMENT:   56 year old male with PMH of ETOH abuse who was admitted to [facility] on 10/18 with diverticular abscess; hospital course complicated by DT's, development of a SBO, and PNA; required intubation on 10/25. He was transferred to [facility] on 10/27 with worsening renal failure, ARDS, aspiration PNA.  Spoke with pt at bedside. TF stopped 11/02. Pt reports a UBW since high school of 145 lbs, pt's current weight is 151 lbs. Pt is net negative 6.6 L since admission.  Pt reports a continued poor appetite, meal completion per chart is approximately 40% at this time. Pt reports intake is also partially impacted by taste of foods from hospital.  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 RD to order.   Labs reviewed; CBG 74-110, Na 128, K 2.9, Hemoglobin 7.3 Medication reviewed; folic acid, sliding scale insulin, Protonix, Thiamine  Diet Order:  Diet heart healthy/carb modified Room service appropriate? Yes; Fluid consistency: Thin  EDUCATION NEEDS:   No education needs have been identified at this time  Skin:  Skin Assessment: Skin Integrity Issues: Skin Integrity Issues:: Stage I Stage I: sacrum  Last BM:  04/20/17  Height:   Ht Readings from Last 1 Encounters:  04/11/17 5\' 11"  (1.803 m)    Weight:   Wt Readings from Last 1 Encounters:  04/20/17 151 lb  14.4 oz (68.9 kg)    Ideal Body Weight:  78.2 kg  BMI:  Body mass index is 21.19 kg/m.  Estimated Nutritional Needs:   Kcal:  1950-2150  Protein:  90-110 gm  Fluid:  2 L  [place], MS, RDN, LDN 04/20/2017 4:54 PM

## 2017-04-21 ENCOUNTER — Inpatient Hospital Stay (HOSPITAL_COMMUNITY): Payer: Medicaid Other

## 2017-04-21 LAB — BASIC METABOLIC PANEL
Anion gap: 11 (ref 5–15)
BUN: 21 mg/dL — AB (ref 6–20)
CALCIUM: 7.7 mg/dL — AB (ref 8.9–10.3)
CO2: 25 mmol/L (ref 22–32)
CREATININE: 3.24 mg/dL — AB (ref 0.61–1.24)
Chloride: 95 mmol/L — ABNORMAL LOW (ref 101–111)
GFR calc Af Amer: 23 mL/min — ABNORMAL LOW (ref >60)
GFR, EST NON AFRICAN AMERICAN: 20 mL/min — AB (ref >60)
GLUCOSE: 109 mg/dL — AB (ref 65–99)
Potassium: 3.3 mmol/L — ABNORMAL LOW (ref 3.5–5.1)
Sodium: 131 mmol/L — ABNORMAL LOW (ref 135–145)

## 2017-04-21 LAB — CBC
HCT: 23.3 % — ABNORMAL LOW (ref 39.0–52.0)
Hemoglobin: 7.7 g/dL — ABNORMAL LOW (ref 13.0–17.0)
MCH: 31 pg (ref 26.0–34.0)
MCHC: 33 g/dL (ref 30.0–36.0)
MCV: 94 fL (ref 78.0–100.0)
Platelets: 284 10*3/uL (ref 150–400)
RBC: 2.48 MIL/uL — ABNORMAL LOW (ref 4.22–5.81)
RDW: 17.5 % — AB (ref 11.5–15.5)
WBC: 17.4 10*3/uL — ABNORMAL HIGH (ref 4.0–10.5)

## 2017-04-21 LAB — GLUCOSE, CAPILLARY
GLUCOSE-CAPILLARY: 62 mg/dL — AB (ref 65–99)
GLUCOSE-CAPILLARY: 81 mg/dL (ref 65–99)
GLUCOSE-CAPILLARY: 130 mg/dL — AB (ref 65–99)
GLUCOSE-CAPILLARY: 105 mg/dL — AB (ref 65–99)
Glucose-Capillary: 70 mg/dL (ref 65–99)
Glucose-Capillary: 104 mg/dL — ABNORMAL HIGH (ref 65–99)

## 2017-04-21 MED ORDER — POTASSIUM CHLORIDE CRYS ER 20 MEQ PO TBCR
40.0000 meq | EXTENDED_RELEASE_TABLET | Freq: Once | ORAL | Status: AC
  Administered 2017-04-21: 40 meq via ORAL
  Filled 2017-04-21: qty 2

## 2017-04-21 NOTE — Progress Notes (Signed)
[facility] ROUNDING NOTE   Subjective:   Interval History: Patient continue to do well this morning. No acute complaints. Mildly hypoglycemic episode this morning.  Objective:  Vital signs in last 24 hours:  Temp:  [97.5 F (36.4 C)-99.5 F (37.5 C)] 97.5 F (36.4 C) (11/06 0800) Pulse Rate:  [102-111] 102 (11/06 0345) Resp:  [14-21] 19 (11/06 0345) BP: (106-140)/(47-78) 140/67 (11/06 0345) SpO2:  [93 %-100 %] 93 % (11/06 0800) Weight:  [146 lb 2.6 oz (66.3 kg)-151 lb 14.4 oz (68.9 kg)] 146 lb 2.6 oz (66.3 kg) (11/06 0800)  Weight change:  Filed Weights   04/18/17 1720 04/20/17 1400 04/21/17 0800  Weight: 151 lb 14.4 oz (68.9 kg) 151 lb 14.4 oz (68.9 kg) 146 lb 2.6 oz (66.3 kg)    Intake/Output: I/O last 3 completed shifts: In: 37 [P.O.:850; I.V.:20; Other:50; IV Piggyback:100] Out: 3235 [Urine:1325; Drains:20; Stool:375]    Intake/Output Summary (Last 24 hours) at 04/21/2017 1056 Last data filed at 04/21/2017 0800 Gross per 24 hour  Intake 770 ml  Output 1695 ml  Net -925 ml      Intake/Output this shift:  Total I/O In: -  Out: 550 [Urine:550]  Gen: no acute distress, in bed answering questions appropriatley and able to participate in exam. HEENT: R nontunneled HD catheter in place CVS: RRR, no murmur appreciated  Resp: breath sounds coarse rhonchi but improved TDD:UKGURKYHC, hypoactive bowel sounds Ext: no peripheral edema, some dependent sacral edema    Basic Metabolic Panel: Recent Labs  Lab 04/16/17 0411 04/16/17 1505 04/17/17 0414 04/17/17 1608 04/17/17 2339 04/18/17 0440 04/19/17 0336 04/20/17 0444  NA 137 138 137 137 133* 135 131* 128*  K 3.9 4.0 3.9 4.2 3.7 3.7 3.3* 2.9*  CL 105 105 104 102 99* 99* 96* 93*  CO2 24 26 27 27 25 25 26 23   GLUCOSE 155* 155* 118* 102* 99 100* 99 65  BUN 37* 28* 19 16 20 20 12 19   CREATININE 3.36* 2.46* 1.90* 1.84* 2.40* 2.64* 1.99* 2.79*  CALCIUM 7.9* 7.8* 7.8* 7.9* 7.4* 7.6* 7.4* 7.3*  MG  2.0  --  2.2  --  2.0 1.9 1.8  --   PHOS 2.1* 1.7* 1.9* 2.8  --  3.9 4.1  --     Liver Function Tests: Recent Labs  Lab 04/15/17 1700 04/16/17 0411 04/16/17 1505 04/17/17 0414 04/17/17 1608  AST  --  20  --   --   --   ALT  --  11*  --   --   --   ALKPHOS  --  92  --   --   --   BILITOT  --  0.4  --   --   --   PROT  --  5.5*  --   --   --   ALBUMIN 1.4* 1.4* 1.4* 1.5* 1.7*   No results for input(s): LIPASE, AMYLASE in the last 168 hours. No results for input(s): AMMONIA in the last 168 hours.  CBC: Recent Labs  Lab 04/15/17 0448 04/16/17 0411 04/17/17 0414 04/18/17 0440 04/19/17 0336 04/20/17 0444  WBC 11.4* 14.0* 15.5* 22.5* 25.1* 21.2*  NEUTROABS 9.2* 12.1*  --   --   --   --   HGB 7.7* 8.2* 8.4* 7.7* 7.0* 7.3*  HCT 23.5* 24.5* 25.2* 23.1* 21.0* 21.8*  MCV 94.0 93.9 95.1 93.5 93.3 93.2  PLT 278 233 215 267 241 254    Cardiac Enzymes: No results for input(s): CKTOTAL, CKMB,  CKMBINDEX, TROPONINI in the last 168 hours.  BNP: Invalid input(s): POCBNP  CBG: Recent Labs  Lab 04/20/17 1935 04/20/17 2345 04/21/17 0346 04/21/17 0805 04/21/17 0841  GLUCAP 75 71 70 62* 49    Microbiology: Results for orders placed or performed during the hospital encounter of 04/11/17  MRSA PCR Screening     Status: None   Collection Time: 04/11/17  3:54 PM  Result Value Ref Range Status   MRSA by PCR NEGATIVE NEGATIVE Final    Comment:        The GeneXpert MRSA Assay (FDA approved for NASAL specimens only), is one component of a comprehensive MRSA colonization surveillance program. It is not intended to diagnose MRSA infection nor to guide or monitor treatment for MRSA infections.     Coagulation Studies: No results for input(s): LABPROT, INR in the last 72 hours.  Urinalysis: No results for input(s): COLORURINE, LABSPEC, PHURINE, GLUCOSEU, HGBUR, BILIRUBINUR, KETONESUR, PROTEINUR, UROBILINOGEN, NITRITE, LEUKOCYTESUR in the last 72 hours.  Invalid input(s):  APPERANCEUR    Imaging: Ct Abdomen Pelvis Wo Contrast  Result Date: 04/20/2017 CLINICAL DATA:  Abdominal infection EXAM: CT ABDOMEN AND PELVIS WITHOUT CONTRAST TECHNIQUE: Multidetector CT imaging of the abdomen and pelvis was performed following the standard protocol without IV contrast. COMPARISON:  Ultrasound 04/13/2017, radiograph 04/11/2017, 04/10/2017, 04/07/2017, 04/02/2017 FINDINGS: Lower chest: Moderate to large bilateral pleural effusions. Scattered ground-glass densities at the lingula and right middle lobe. Dense right greater than left lower lobe consolidations heart size is nonenlarged. Hepatobiliary: No focal liver abnormality is seen. No gallstones, gallbladder wall thickening, or biliary dilatation. Pancreas: Unremarkable. No pancreatic ductal dilatation or surrounding inflammatory changes. Spleen: Normal in size without focal abnormality. Adrenals/Urinary Tract: Adrenal glands are within normal limits. No hydronephrosis. Foley catheter and air in the urinary bladder. Stomach/Bowel: There is contrast within the stomach. Dilated loops of small bowel containing contrast, these measure up to 3.3 cm. Tapering 2 decompressed distal small bowel in the pelvis. Colon is collapsed. A rectal catheter remains in place. Mild sigmoid colon diverticular disease with minimal wall thickening. Vascular/Lymphatic: Aortic atherosclerosis. No aneurysmal dilatation. No significant adenopathy Reproductive: Prostate is unremarkable. Other: Negative for free air. Right pelvic pigtail drainage catheter re- demonstrated with multiple gas bubbles around the pigtail loop. Slight decrease in size of a small residual fluid collection measuring 2.8 cm in the right pelvis, likely reflecting small residual abscess. Continued moderate ascites within the abdomen and pelvis with small loculated collection along the posterior aspect of the right hepatic lobe. Musculoskeletal: Degenerative changes. No acute or suspicious bone  lesion. IMPRESSION: 1. Stable positioning of the right pelvic drainage catheter. Multiple foci of gas around the pigtail loop but no significant residual abscess collection around the pigtail catheter. Catheter tip is in very close proximity to the sigmoid colon and fistula cannot be ruled out ; direct catheter injection may be helpful for assessment. 2. Slight decrease in size of the residual right pelvic fluid collection/abscess which does not appear to communicate with the right pelvic drain 3. Continued small bowel dilatation with transition to decompressed distal small bowel in the pelvis suggesting bowel obstruction. This does not appear worse in the interim. 4. Moderate amount of ascites within the abdomen and pelvis with some loculated appearance of the ascites adjacent to the inferior right hepatic lobe 5. Moderate to large bilateral pleural effusions with right greater than left lower lobe consolidations, possible pneumonia Electronically Signed   By: Donavan Foil M.D.   On: 04/20/2017 01:24  Dg Chest Port 1 View  Result Date: 04/21/2017 CLINICAL DATA:  56 year old male with pleural effusion. Subsequent encounter. EXAM: PORTABLE CHEST 1 VIEW COMPARISON:  04/17/2017 chest x-ray. FINDINGS: Right-sided central line tip distal superior vena cava level. Left central line, endotracheal tube and nasogastric tube and been removed. Increase in size of right-sided pleural effusion now moderately large. Asymmetric airspace disease has progressed and may represent pulmonary edema. Limited for evaluating for underlying infiltrate or mass. Heart size within normal limits. Calcified aorta. IMPRESSION: Left central line, endotracheal tube and nasogastric tube and been removed. Increase in size of right-sided pleural effusion now moderately large. Asymmetric airspace disease has progressed and may represent pulmonary edema. Limited for evaluating for possibly of underlying infiltrate or mass. Electronically Signed    By: Genia Del M.D.   On: 04/21/2017 10:29     Medications:   . sodium chloride    . sodium chloride 10 mL/hr at 04/19/17 1900  . sodium chloride    . sodium chloride    . dexmedetomidine (PRECEDEX) IV infusion Stopped (04/17/17 1700)  . feeding supplement (VITAL AF 1.2 CAL) Stopped (04/17/17 0905)  . piperacillin-tazobactam (ZOSYN)  IV 3.375 g (04/21/17 1014)   . Chlorhexidine Gluconate Cloth  6 each Topical Daily  . darbepoetin (ARANESP) injection - DIALYSIS  200 mcg Intravenous Q Mon-HD  . feeding supplement (ENSURE ENLIVE)  237 mL Oral BID BM  . folic acid  1 mg Oral QHS  . heparin subcutaneous  5,000 Units Subcutaneous Q8H  . insulin aspart  0-9 Units Subcutaneous Q4H  . mouth rinse  15 mL Mouth Rinse BID  . pantoprazole  40 mg Oral QHS  . sodium chloride flush  10-40 mL Intracatheter Q12H  . thiamine  100 mg Oral QHS  . traZODone  50 mg Oral QHS   sodium chloride, sodium chloride, sodium chloride, alteplase, fentaNYL (SUBLIMAZE) injection, heparin, heparin, heparin, ipratropium-albuterol, lidocaine (PF), lidocaine-prilocaine, metoprolol tartrate, ondansetron (ZOFRAN) IV, pentafluoroprop-tetrafluoroeth, sodium chloride flush, zolpidem  Assessment/ Plan:  1. AKI (unknown b/l) - Baseline Cr unknown.  Initially on CRRT,  tolerated IHD. Will follow up on am BMP. UOP in the past 24 hrs has been adequate on lasix 120 IV once. Will continue to monitor for recovery still no indication for dialysis.  2. Hypokalemia- Will follow up on am BMP and replete as needed.  3. Anemia of chronic disease - initiated po iron, Aranesp 200 mcg q Monday 4. VDRF - s/p extubation, resp status improving 5. Diverticular abscess: s/p drain placed by IR, on Zosyn, repeat imaging per primary 6. Hypophosphatemia: Resolved      LOS: [place], MD [place], PGY-2  @TODAY @10 :56 AM

## 2017-04-21 NOTE — Progress Notes (Signed)
Inpatient Rehabilitation  Met with patient to discuss team's recommendation for IP Rehab.  Shared booklets, insurance verification letter, and answered initial questions.  Patient is motivated to get back his independence in order to care for himself and his son and reports good family/friend support in the mean time.  Plan to follow for timing of medical readiness, therapy tolerance, and IP Rehab bed availability.  Call if questions.    Carmelia Roller., CCC/SLP Admission Coordinator  [place]  Cell [phone]

## 2017-04-21 NOTE — Progress Notes (Addendum)
PROGRESS NOTE    Dominic Phillips  TZG:[id number] DOB: 09-21-1960 DOA: 04/11/2017 PCP: No primary care provider on file.   Brief Narrative: Dominic Phillips is a 56 y.o. 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   Transferred from [facility] for management of ARDS and progressive renal insufficiency. This gentleman has a long history of heavy ETOH abuse (~ 18 beers per day) and presented to [facility] on 04/02/2017 with a c/o of abdominal pain without fevers, leukocytosis or diarrhea. An abd CT revealed pericolonic inflammation/fluid collection, for which he was admitted for diverticular abscess. Was consulted by Gen Surg who suggested placement of a catheter by interventional radiology; subsequent cultures grew E. Coli, for which Zosyn was initiated. Hospital course was complicated by the development of DTs, treated with 1 beer tid and Ativan prn. He slowly improved but worsened on 10/23 with increased WBC and progressive hypoxemia. Repeat abdominal CT showed improving abscess but developing SBO and multilobar pneumonia. Hypoxemia could not be corrected with BiPAP, along with worsening renal function such that he was intubated on 10/25 and required pressors for hemodynamic support. CT chest/abdomen/pelvis on 10/26 showed bilateral pneumonia with volume overload; no change in the RLQ percutaneous drain catheter. With rising creat to 3.1 and 290 cc U/O over 24 hours, transfer was requested.   He required intubation from 10/27 to 11/2 for ARDS and aspiration pneumonia and pressors for circulatory shock in ICU. He was receiving CRRT for AKI and has tolerated IHD x1. Worsening leukocytosis possibly secondary abdominal abscess.   Assessment & Plan:   Active Problems:   Acute respiratory distress syndrome (ARDS) (HCC)   Acute kidney injury ([place])   Pressure injury of skin   Colonic diverticular abscess   COPD (chronic obstructive pulmonary disease) (HCC)   Dyspnea   HCAP  (healthcare-associated pneumonia)   Acute blood loss anemia   Anemia of chronic disease   ETOH abuse   Tobacco abuse   Tachycardia   Tachypnea   Leukocytosis   Post-operative pain   Acute respiratory distress syndrome Seen on chest x-ray but possibly secondary to volume overload in setting of acute kidney injury Patient required intubation from 10/27 to 11/2. Resolved.  Bilateral pleural effusions Patient still on oxygen. No respiratory distress. Present on today's x-ray s/p Lasix with urine output less than I would have anticipated. -will give another dose of Lasix today pending metabolic panel and kidney function -nephrology recommendations  Aspiration pneumonia Treated with Zosyn. Improved. Course completed. Mild sputum production which has improved. Chest x-ray with ?progression. Likely more secondary to effusion.  Diverticular abscess S/p drain. On zosyn per ID. Leukocytosis improved today. CT scan shows minimally improved abscess that is not communicating with drain. -general surgery recommendations -continue Zosyn  Abdominal wound drainage -per general surgery recommendations  Leukocytosis Possibly secondary to abdominal abscess. Afebrile. -trend CBC -management per above  Acute kidney injury Patient initially with oliguria requiring CRRT. Had IHD on 11/3 without issue -nephrology recommendations: IHD  COPD Patient states he has quit smoking. Mild cough -continue Duoneb prn  Circulatory shock Patient required Levophed and was weaned off on 04/17/17. Normotensive.  Ethanol abuse Out of window for withdrawals.  Hypokalemia -supplement with oral potassium  Insomnia -continue trazodone -discontinue Ambien   DVT prophylaxis: heparin Code Status: Partial code, DNI Family Communication: None at bedside Disposition Plan: When medically stable   Consultants:   General surgery  PCCM  Nephrology  Procedures:   CRRT  IHD  Antimicrobials:  Zosyn  (10/27>>10/30; 11/2>>  Augmentin (10/31>>11/1)    Subjective: Patient with hypoglycemia overnight. Also, had some agitation likely secondary to Ambien.  Objective: Vitals:   04/20/17 1600 04/20/17 2345 04/21/17 0345 04/21/17 0800  BP: (!) 106/47 117/78 140/67   Pulse: (!) 111 (!) 107 (!) 102   Resp: (!) 21 19 19    Temp: 98.2 F (36.8 C) 98.8 F (37.1 C) 99.5 F (37.5 C) (!) 97.5 F (36.4 C)  TempSrc: Oral Oral Axillary Oral  SpO2: 96% 93% 96% 93%  Weight:    66.3 kg (146 lb 2.6 oz)  Height:        Intake/Output Summary (Last 24 hours) at 04/21/2017 0952 Last data filed at 04/21/2017 0800 Gross per 24 hour  Intake 1020 ml  Output 1695 ml  Net -675 ml   Filed Weights   04/18/17 1720 04/20/17 1400 04/21/17 0800  Weight: 68.9 kg (151 lb 14.4 oz) 68.9 kg (151 lb 14.4 oz) 66.3 kg (146 lb 2.6 oz)    Examination:  General exam: Appears calm and comfortable Respiratory system: Clear to auscultation, diminished. Respiratory effort normal. Cardiovascular system: S1 & S2 heard, RRR. No murmurs. Gastrointestinal system: Abdomen is distended, soft and nontender. Normal bowel sounds heard. Tube in RLQ. Central nervous system: Asleep Extremities: No edema. No calf tenderness Skin: No cyanosis. No rashes     Data Reviewed: I have personally reviewed following labs and imaging studies  CBC: Recent Labs  Lab 04/15/17 0448 04/16/17 0411 04/17/17 0414 04/18/17 0440 04/19/17 0336 04/20/17 0444  WBC 11.4* 14.0* 15.5* 22.5* 25.1* 21.2*  NEUTROABS 9.2* 12.1*  --   --   --   --   HGB 7.7* 8.2* 8.4* 7.7* 7.0* 7.3*  HCT 23.5* 24.5* 25.2* 23.1* 21.0* 21.8*  MCV 94.0 93.9 95.1 93.5 93.3 93.2  PLT 278 233 215 267 241 176   Basic Metabolic Panel: Recent Labs  Lab 04/16/17 0411 04/16/17 1505 04/17/17 0414 04/17/17 1608 04/17/17 2339 04/18/17 0440 04/19/17 0336 04/20/17 0444  NA 137 138 137 137 133* 135 131* 128*  K 3.9 4.0 3.9 4.2 3.7 3.7 3.3* 2.9*  CL 105 105 104 102 99*  99* 96* 93*  CO2 24 26 27 27 25 25 26 23   GLUCOSE 155* 155* 118* 102* 99 100* 99 65  BUN 37* 28* 19 16 20 20 12 19   CREATININE 3.36* 2.46* 1.90* 1.84* 2.40* 2.64* 1.99* 2.79*  CALCIUM 7.9* 7.8* 7.8* 7.9* 7.4* 7.6* 7.4* 7.3*  MG 2.0  --  2.2  --  2.0 1.9 1.8  --   PHOS 2.1* 1.7* 1.9* 2.8  --  3.9 4.1  --    GFR: Estimated Creatinine Clearance: 28.4 mL/min (A) (by C-G formula based on SCr of 2.79 mg/dL (H)). Liver Function Tests: Recent Labs  Lab 04/15/17 1700 04/16/17 0411 04/16/17 1505 04/17/17 0414 04/17/17 1608  AST  --  20  --   --   --   ALT  --  11*  --   --   --   ALKPHOS  --  92  --   --   --   BILITOT  --  0.4  --   --   --   PROT  --  5.5*  --   --   --   ALBUMIN 1.4* 1.4* 1.4* 1.5* 1.7*   No results for input(s): LIPASE, AMYLASE in the last 168 hours. No results for input(s): AMMONIA in the last 168 hours. Coagulation  Profile: No results for input(s): INR, PROTIME in the last 168 hours. Cardiac Enzymes: No results for input(s): CKTOTAL, CKMB, CKMBINDEX, TROPONINI in the last 168 hours. BNP (last 3 results) No results for input(s): PROBNP in the last 8760 hours. HbA1C: No results for input(s): HGBA1C in the last 72 hours. CBG: Recent Labs  Lab 04/20/17 1935 04/20/17 2345 04/21/17 0346 04/21/17 0805 04/21/17 0841  GLUCAP 75 71 70 62* 81   Lipid Profile: No results for input(s): CHOL, HDL, LDLCALC, TRIG, CHOLHDL, LDLDIRECT in the last 72 hours. Thyroid Function Tests: No results for input(s): TSH, T4TOTAL, FREET4, T3FREE, THYROIDAB in the last 72 hours. Anemia Panel: No results for input(s): VITAMINB12, FOLATE, FERRITIN, TIBC, IRON, RETICCTPCT in the last 72 hours. Sepsis Labs: No results for input(s): PROCALCITON, LATICACIDVEN in the last 168 hours.  Recent Results (from the past 240 hour(s))  MRSA PCR Screening     Status: None   Collection Time: 04/11/17  3:54 PM  Result Value Ref Range Status   MRSA by PCR NEGATIVE NEGATIVE Final    Comment:         The GeneXpert MRSA Assay (FDA approved for NASAL specimens only), is one component of a comprehensive MRSA colonization surveillance program. It is not intended to diagnose MRSA infection nor to guide or monitor treatment for MRSA infections.          Radiology Studies: Ct Abdomen Pelvis Wo Contrast  Result Date: 04/20/2017 CLINICAL DATA:  Abdominal infection EXAM: CT ABDOMEN AND PELVIS WITHOUT CONTRAST TECHNIQUE: Multidetector CT imaging of the abdomen and pelvis was performed following the standard protocol without IV contrast. COMPARISON:  Ultrasound 04/13/2017, radiograph 04/11/2017, 04/10/2017, 04/07/2017, 04/02/2017 FINDINGS: Lower chest: Moderate to large bilateral pleural effusions. Scattered ground-glass densities at the lingula and right middle lobe. Dense right greater than left lower lobe consolidations heart size is nonenlarged. Hepatobiliary: No focal liver abnormality is seen. No gallstones, gallbladder wall thickening, or biliary dilatation. Pancreas: Unremarkable. No pancreatic ductal dilatation or surrounding inflammatory changes. Spleen: Normal in size without focal abnormality. Adrenals/Urinary Tract: Adrenal glands are within normal limits. No hydronephrosis. Foley catheter and air in the urinary bladder. Stomach/Bowel: There is contrast within the stomach. Dilated loops of small bowel containing contrast, these measure up to 3.3 cm. Tapering 2 decompressed distal small bowel in the pelvis. Colon is collapsed. A rectal catheter remains in place. Mild sigmoid colon diverticular disease with minimal wall thickening. Vascular/Lymphatic: Aortic atherosclerosis. No aneurysmal dilatation. No significant adenopathy Reproductive: Prostate is unremarkable. Other: Negative for free air. Right pelvic pigtail drainage catheter re- demonstrated with multiple gas bubbles around the pigtail loop. Slight decrease in size of a small residual fluid collection measuring 2.8 cm in the right  pelvis, likely reflecting small residual abscess. Continued moderate ascites within the abdomen and pelvis with small loculated collection along the posterior aspect of the right hepatic lobe. Musculoskeletal: Degenerative changes. No acute or suspicious bone lesion. IMPRESSION: 1. Stable positioning of the right pelvic drainage catheter. Multiple foci of gas around the pigtail loop but no significant residual abscess collection around the pigtail catheter. Catheter tip is in very close proximity to the sigmoid colon and fistula cannot be ruled out ; direct catheter injection may be helpful for assessment. 2. Slight decrease in size of the residual right pelvic fluid collection/abscess which does not appear to communicate with the right pelvic drain 3. Continued small bowel dilatation with transition to decompressed distal small bowel in the pelvis suggesting bowel obstruction. This does not  appear worse in the interim. 4. Moderate amount of ascites within the abdomen and pelvis with some loculated appearance of the ascites adjacent to the inferior right hepatic lobe 5. Moderate to large bilateral pleural effusions with right greater than left lower lobe consolidations, possible pneumonia Electronically Signed   By: Donavan Foil M.D.   On: 04/20/2017 01:24        Scheduled Meds: . Chlorhexidine Gluconate Cloth  6 each Topical Daily  . darbepoetin (ARANESP) injection - DIALYSIS  200 mcg Intravenous Q Mon-HD  . feeding supplement (ENSURE ENLIVE)  237 mL Oral BID BM  . folic acid  1 mg Oral QHS  . heparin subcutaneous  5,000 Units Subcutaneous Q8H  . insulin aspart  0-9 Units Subcutaneous Q4H  . mouth rinse  15 mL Mouth Rinse BID  . pantoprazole  40 mg Oral QHS  . sodium chloride flush  10-40 mL Intracatheter Q12H  . thiamine  100 mg Oral QHS  . traZODone  50 mg Oral QHS   Continuous Infusions: . sodium chloride    . sodium chloride 10 mL/hr at 04/19/17 1900  . sodium chloride    . sodium  chloride    . dexmedetomidine (PRECEDEX) IV infusion Stopped (04/17/17 1700)  . feeding supplement (VITAL AF 1.2 CAL) Stopped (04/17/17 0905)  . piperacillin-tazobactam (ZOSYN)  IV Stopped (04/21/17 0130)     LOS: 10 days     Cordelia Poche, MD [facility] 04/21/2017, 9:52 AM Pager: [phone]  If 7PM-7AM, please contact night-coverage [email or web address] Password [email or web address] 04/21/2017, 9:52 AM

## 2017-04-21 NOTE — Progress Notes (Signed)
Hypoglycemic Event  CBG: 62  Treatment: orange juice  Symptoms: None  Follow-up CBG: Time: 50mins CBG Result: 82  Possible Reasons for Event: History of BG trending low Comments/MD notified: No    Dominic Phillips  Dominic Phillips

## 2017-04-21 NOTE — Progress Notes (Signed)
Physical Therapy Treatment Patient Details Name: Dominic Phillips MRN: [id number] DOB: [birth year] Today's Date: 04/21/2017    History of Present Illness Pt admitted to [facility] and transferred on 04/02/17 with c/o abdominal pain.  Pt presented with ETOH withdrawls, liver abscess and SBO on TPN.  10/23 presents with hypoxia and increased WBC count, 10/25 intubated, 10/26 scans show PNA and fluid overload, patient extubated on 04/17/17.  Chart review shows COPD, VDRF, anemia, AKI and hypokalemia in addition to previous listed statements.  PMH: ETOH abuse.      PT Comments    Pt in bed upon arrival with trunk laterally flexed. Repositioned pt into a neutral position. Declined getting OOB due to prior ambulation and recent return to bed. Agreeable to perform supine exercises in bed. Pt able to perform all exercises AROM. Required VCs for technique and speed. Fatigued while performing SLR and SAQs but able to complete reps with increased time. Pt will benefit from continued PT to increase functional independence and strength.  Follow Up Recommendations  CIR     Equipment Recommendations  Rolling walker with 5" wheels;3in1 (PT)    Recommendations for Other Services       Precautions / Restrictions Precautions Precautions: Fall Precaution Comments: Drain, Flexiseal, catheter Restrictions Weight Bearing Restrictions: No    Mobility  Bed Mobility Overal bed mobility: Needs Assistance Bed Mobility: Rolling           General bed mobility comments: Pt declined getting OOB because had just returned to bed. Max A +2 scooting up in bed with chuck. Pt able to straighten trunk out in bed using rail.   Transfers                    Ambulation/Gait                 Stairs            Wheelchair Mobility    Modified Rankin (Stroke Patients Only)       Balance                                            Cognition Arousal/Alertness:  Awake/alert Behavior During Therapy: WFL for tasks assessed/performed Overall Cognitive Status: Within Functional Limits for tasks assessed                                 General Comments: Pt agreeable to perform supine exercises.      Exercises General Exercises - Lower Extremity Ankle Circles/Pumps: AROM;Both;20 reps;Supine Quad Sets: AROM;Both;10 reps;Supine Short Arc Quad: AROM;Both;10 reps;Supine Heel Slides: AROM;Both;10 reps;Supine Hip ABduction/ADduction: AROM;Both;Supine;20 reps Straight Leg Raises: AROM;Both;10 reps;Supine    General Comments        Pertinent Vitals/Pain Pain Assessment: 0-10 Pain Score: 7  Pain Location: anterior abdominal area Pain Descriptors / Indicators: Aching;Grimacing;Guarding Pain Intervention(s): Limited activity within patient's tolerance;Monitored during session;Repositioned    Home Living                      Prior Function            PT Goals (current goals can now be found in the care plan section) Acute Rehab PT Goals Patient Stated Goal: to get home PT Goal Formulation: With patient Time For Goal Achievement: 05/02/17 Potential to  Achieve Goals: Good Progress towards PT goals: Progressing toward goals    Frequency    Min 3X/week      PT Plan Current plan remains appropriate    Co-evaluation              AM-PAC PT "6 Clicks" Daily Activity  Outcome Measure  Difficulty turning over in bed (including adjusting bedclothes, sheets and blankets)?: A Lot Difficulty moving from lying on back to sitting on the side of the bed? : A Lot Difficulty sitting down on and standing up from a chair with arms (e.g., wheelchair, bedside commode, etc,.)?: A Lot Help needed moving to and from a bed to chair (including a wheelchair)?: A Lot Help needed walking in hospital room?: A Lot Help needed climbing 3-5 steps with a railing? : Total 6 Click Score: 11    End of Session Equipment Utilized During  Treatment: Oxygen Activity Tolerance: Patient tolerated treatment well Patient left: with call bell/phone within reach;with family/visitor present;in bed;with bed alarm set   PT Visit Diagnosis: Unsteadiness on feet (R26.81);Muscle weakness (generalized) (M62.81);Other abnormalities of gait and mobility (R26.89)     Time: 1216-2446 PT Time Calculation (min) (ACUTE ONLY): 22 min  Charges:  $Therapeutic Exercise: 8-22 mins                    G Codes:       Janna Arch, SPTA   Janna Arch 04/21/2017, 5:10 PM

## 2017-04-21 NOTE — Care Management Note (Signed)
Case Management Note  Patient Details  Name: Dominic Phillips MRN: [id number] Date of Birth: March 28, 1961  Subjective/Objective:    Pt admitted with AKI and ARDS       Action/Plan:  PTA from home.  Pt extubated 04/17/17 but still requiring CRRT.  CSW consulted for substance abuse and potential placement.  CM will continue to follow for discharge needs   Expected Discharge Date:                  Expected Discharge Plan:  [place]  In-House Referral:  Clinical Social Work  Discharge planning Services  CM Consult  Post Acute Care Choice:    Choice offered to:     DME Arranged:    DME Agency:     HH Arranged:    [place] Agency:     Status of Service:     If discussed at [employer], dates discussed:    Additional Comments: 04/21/2017 Discussed in LOS 11/6 - remains appropriate for continued stay- JP drain still in place, WBC trending up, IV antibiotics.  Pt is also being followed by renal - pt has received 1 IHD treatment with daily assessments for additional HD sessions.  Maryclare Labrador, RN 04/21/2017, 3:13 PM

## 2017-04-21 NOTE — Progress Notes (Signed)
Unable to wean down O2 bc pt has desated every attempt today. When at rest pt desats throughout day to mid 80s and when walking pt desats even lower. Currently on 4L O2. Pt does tend to readjust [place] often which causes lower sats. Educated not to alter positioning of .   [place]  Megon Kalina, RN

## 2017-04-21 NOTE — Progress Notes (Signed)
[facility] Progress Note     Subjective: CC:  C/o mild abdominal pain. Tolerating PO but reports early satiety. Started drinking ensure shakes today between meals. Working with PT but overall feels deconditioned. Denies fever, chills, nausea, vomiting. Liquid stool in rectal tube.   Objective: Vital signs in last 24 hours: Temp:  [97.5 F (36.4 C)-99.5 F (37.5 C)] 97.5 F (36.4 C) (11/06 0800) Pulse Rate:  [102-111] 102 (11/06 0345) Resp:  [19-21] 19 (11/06 0345) BP: (106-140)/(47-78) 140/67 (11/06 0345) SpO2:  [93 %-96 %] 93 % (11/06 0800) Weight:  [66.3 kg (146 lb 2.6 oz)-68.9 kg (151 lb 14.4 oz)] 66.3 kg (146 lb 2.6 oz) (11/06 0800) Last BM Date: 04/21/17  Intake/Output from previous day: 11/05 0701 - 11/06 0700 In: 1020 [P.O.:850; I.V.:20; IV Piggyback:100] Out: 1170 [Urine:875; Drains:20; Stool:275] Intake/Output this shift: Total I/O In: -  Out: 550 [Urine:550]  PE: Gen:  Alert, NAD, pleasant Eyes: pupils equal and round, EOMs intact Card:  Regular rate and rhythm, no LE edema.  Pulm:  Normal effort, some ronchi bilaterally Abd: firm, mild global tenderness - worse over epigastrium today, no peritonitis, +BS, RLQ drain in place with small amount feculent drainage.  Skin: warm and dry, no rashes  Psych: A&Ox3   Lab Results:  Recent Labs    04/19/17 0336 04/20/17 0444  WBC 25.1* 21.2*  HGB 7.0* 7.3*  HCT 21.0* 21.8*  PLT 241 254   BMET Recent Labs    04/19/17 0336 04/20/17 0444  NA 131* 128*  K 3.3* 2.9*  CL 96* 93*  CO2 26 23  GLUCOSE 99 65  BUN 12 19  CREATININE 1.99* 2.79*  CALCIUM 7.4* 7.3*   PT/INR No results for input(s): LABPROT, INR in the last 72 hours. CMP     Component Value Date/Time   NA 128 (L) 04/20/2017 0444   K 2.9 (L) 04/20/2017 0444   CL 93 (L) 04/20/2017 0444   CO2 23 04/20/2017 0444   GLUCOSE 65 04/20/2017 0444   BUN 19 04/20/2017 0444   CREATININE 2.79 (H) 04/20/2017 0444   CALCIUM 7.3 (L) 04/20/2017  0444   PROT 5.5 (L) 04/16/2017 0411   ALBUMIN 1.7 (L) 04/17/2017 1608   AST 20 04/16/2017 0411   ALT 11 (L) 04/16/2017 0411   ALKPHOS 92 04/16/2017 0411   BILITOT 0.4 04/16/2017 0411   GFRNONAA 24 (L) 04/20/2017 0444   GFRAA 28 (L) 04/20/2017 0444   Lipase  No results found for: LIPASE     Studies/Results: Ct Abdomen Pelvis Wo Contrast  Result Date: 04/20/2017 CLINICAL DATA:  Abdominal infection EXAM: CT ABDOMEN AND PELVIS WITHOUT CONTRAST TECHNIQUE: Multidetector CT imaging of the abdomen and pelvis was performed following the standard protocol without IV contrast. COMPARISON:  Ultrasound 04/13/2017, radiograph 04/11/2017, 04/10/2017, 04/07/2017, 04/02/2017 FINDINGS: Lower chest: Moderate to large bilateral pleural effusions. Scattered ground-glass densities at the lingula and right middle lobe. Dense right greater than left lower lobe consolidations heart size is nonenlarged. Hepatobiliary: No focal liver abnormality is seen. No gallstones, gallbladder wall thickening, or biliary dilatation. Pancreas: Unremarkable. No pancreatic ductal dilatation or surrounding inflammatory changes. Spleen: Normal in size without focal abnormality. Adrenals/Urinary Tract: Adrenal glands are within normal limits. No hydronephrosis. Foley catheter and air in the urinary bladder. Stomach/Bowel: There is contrast within the stomach. Dilated loops of small bowel containing contrast, these measure up to 3.3 cm. Tapering 2 decompressed distal small bowel in the pelvis. Colon is collapsed. A rectal catheter remains in  place. Mild sigmoid colon diverticular disease with minimal wall thickening. Vascular/Lymphatic: Aortic atherosclerosis. No aneurysmal dilatation. No significant adenopathy Reproductive: Prostate is unremarkable. Other: Negative for free air. Right pelvic pigtail drainage catheter re- demonstrated with multiple gas bubbles around the pigtail loop. Slight decrease in size of a small residual fluid  collection measuring 2.8 cm in the right pelvis, likely reflecting small residual abscess. Continued moderate ascites within the abdomen and pelvis with small loculated collection along the posterior aspect of the right hepatic lobe. Musculoskeletal: Degenerative changes. No acute or suspicious bone lesion. IMPRESSION: 1. Stable positioning of the right pelvic drainage catheter. Multiple foci of gas around the pigtail loop but no significant residual abscess collection around the pigtail catheter. Catheter tip is in very close proximity to the sigmoid colon and fistula cannot be ruled out ; direct catheter injection may be helpful for assessment. 2. Slight decrease in size of the residual right pelvic fluid collection/abscess which does not appear to communicate with the right pelvic drain 3. Continued small bowel dilatation with transition to decompressed distal small bowel in the pelvis suggesting bowel obstruction. This does not appear worse in the interim. 4. Moderate amount of ascites within the abdomen and pelvis with some loculated appearance of the ascites adjacent to the inferior right hepatic lobe 5. Moderate to large bilateral pleural effusions with right greater than left lower lobe consolidations, possible pneumonia Electronically Signed   By: Donavan Foil M.D.   On: 04/20/2017 01:24   Dg Chest Port 1 View  Result Date: 04/21/2017 CLINICAL DATA:  56 year old male with pleural effusion. Subsequent encounter. EXAM: PORTABLE CHEST 1 VIEW COMPARISON:  04/17/2017 chest x-ray. FINDINGS: Right-sided central line tip distal superior vena cava level. Left central line, endotracheal tube and nasogastric tube and been removed. Increase in size of right-sided pleural effusion now moderately large. Asymmetric airspace disease has progressed and may represent pulmonary edema. Limited for evaluating for underlying infiltrate or mass. Heart size within normal limits. Calcified aorta. IMPRESSION: Left central line,  endotracheal tube and nasogastric tube and been removed. Increase in size of right-sided pleural effusion now moderately large. Asymmetric airspace disease has progressed and may represent pulmonary edema. Limited for evaluating for possibly of underlying infiltrate or mass. Electronically Signed   By: Genia Del M.D.   On: 04/21/2017 10:29    Anti-infectives: Anti-infectives (From admission, onward)   Start     Dose/Rate Route Frequency Ordered Stop   04/18/17 1800  piperacillin-tazobactam (ZOSYN) IVPB 3.375 g     3.375 g 12.5 mL/hr over 240 Minutes Intravenous Every 12 hours 04/18/17 1032     04/18/17 1400  piperacillin-tazobactam (ZOSYN) IVPB 3.375 g  Status:  Discontinued     3.375 g 12.5 mL/hr over 240 Minutes Intravenous Every 8 hours 04/18/17 1031 04/18/17 1032   04/17/17 1200  piperacillin-tazobactam (ZOSYN) IVPB 3.375 g  Status:  Discontinued     3.375 g 100 mL/hr over 30 Minutes Intravenous Every 6 hours 04/17/17 1030 04/18/17 1031   04/17/17 1100  piperacillin-tazobactam (ZOSYN) IVPB 3.375 g  Status:  Discontinued     3.375 g 12.5 mL/hr over 240 Minutes Intravenous Every 8 hours 04/17/17 1019 04/17/17 1030   04/15/17 2125  Ampicillin-Sulbactam (UNASYN) 3 g in sodium chloride 0.9 % 100 mL IVPB  Status:  Discontinued     3 g 200 mL/hr over 30 Minutes Intravenous Every 8 hours 04/15/17 1529 04/17/17 1019   04/15/17 1100  ampicillin-sulbactam (UNASYN) 1.5 g in sodium chloride 0.9 %  50 mL IVPB  Status:  Discontinued     1.5 g 100 mL/hr over 30 Minutes Intravenous Every 12 hours 04/15/17 0937 04/15/17 1529   04/13/17 2200  levofloxacin (LEVAQUIN) IVPB 500 mg  Status:  Discontinued     500 mg 100 mL/hr over 60 Minutes Intravenous Every 48 hours 04/12/17 0114 04/12/17 0846   04/13/17 1800  piperacillin-tazobactam (ZOSYN) IVPB 2.25 g  Status:  Discontinued     2.25 g 100 mL/hr over 30 Minutes Intravenous Every 8 hours 04/13/17 1050 04/15/17 0937   04/12/17 0200   piperacillin-tazobactam (ZOSYN) IVPB 3.375 g  Status:  Discontinued     3.375 g 12.5 mL/hr over 240 Minutes Intravenous Every 8 hours 04/12/17 0103 04/13/17 1050   04/12/17 0000  levofloxacin (LEVAQUIN) IVPB 750 mg  Status:  Discontinued     750 mg 100 mL/hr over 90 Minutes Intravenous Every 48 hours 04/11/17 2230 04/12/17 0114     Assessment/Plan Diverticulitis with perforation s/p IR drain - Repeat CT 04/19/17: no significant residual abscess around drain, 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 Stable small bowel dilation. - Afebrile, WBC 21.2 yesterday from 25.1 - Drain 20 cc/24h  - Having bowel function and pain improving - Continue IV abx  - continue therapies   FEN: HH/carb mod ID: Unasyn 10/31-11/2, Zosyn 10/28 >> (day #9) VTE: SCD's, SQ heparin     LOS: 10 days    Jill Alexanders , [facility] 04/21/2017, 12:09 PM Pager: [phone] Consults: [phone] Mon-Fri 7:00 am-4:30 pm Sat-Sun 7:00 am-11:30 am

## 2017-04-22 LAB — COMPREHENSIVE METABOLIC PANEL
ALT: 9 U/L — ABNORMAL LOW (ref 17–63)
AST: 19 U/L (ref 15–41)
Albumin: 1.8 g/dL — ABNORMAL LOW (ref 3.5–5.0)
Alkaline Phosphatase: 67 U/L (ref 38–126)
Anion gap: 9 (ref 5–15)
BILIRUBIN TOTAL: 0.7 mg/dL (ref 0.3–1.2)
BUN: 22 mg/dL — AB (ref 6–20)
CHLORIDE: 96 mmol/L — AB (ref 101–111)
CO2: 26 mmol/L (ref 22–32)
CREATININE: 3.31 mg/dL — AB (ref 0.61–1.24)
Calcium: 7.7 mg/dL — ABNORMAL LOW (ref 8.9–10.3)
GFR, EST AFRICAN AMERICAN: 23 mL/min — AB (ref >60)
GFR, EST NON AFRICAN AMERICAN: 20 mL/min — AB (ref >60)
Glucose, Bld: 94 mg/dL (ref 65–99)
POTASSIUM: 3.4 mmol/L — AB (ref 3.5–5.1)
Sodium: 131 mmol/L — ABNORMAL LOW (ref 135–145)
TOTAL PROTEIN: 6 g/dL — AB (ref 6.5–8.1)

## 2017-04-22 LAB — GLUCOSE, CAPILLARY
GLUCOSE-CAPILLARY: 104 mg/dL — AB (ref 65–99)
GLUCOSE-CAPILLARY: 87 mg/dL (ref 65–99)
GLUCOSE-CAPILLARY: 92 mg/dL (ref 65–99)
GLUCOSE-CAPILLARY: 99 mg/dL (ref 65–99)
Glucose-Capillary: 85 mg/dL (ref 65–99)
Glucose-Capillary: 87 mg/dL (ref 65–99)
Glucose-Capillary: 94 mg/dL (ref 65–99)

## 2017-04-22 LAB — CBC WITH DIFFERENTIAL/PLATELET
BASOS ABS: 0.1 10*3/uL (ref 0.0–0.1)
Basophils Relative: 0 %
EOS PCT: 1 %
Eosinophils Absolute: 0.2 10*3/uL (ref 0.0–0.7)
HEMATOCRIT: 21.9 % — AB (ref 39.0–52.0)
Hemoglobin: 7.5 g/dL — ABNORMAL LOW (ref 13.0–17.0)
Lymphocytes Relative: 8 %
Lymphs Abs: 1.3 10*3/uL (ref 0.7–4.0)
MCH: 32.2 pg (ref 26.0–34.0)
MCHC: 34.2 g/dL (ref 30.0–36.0)
MCV: 94 fL (ref 78.0–100.0)
Monocytes Absolute: 1.5 10*3/uL — ABNORMAL HIGH (ref 0.1–1.0)
Monocytes Relative: 10 %
NEUTROS PCT: 81 %
Neutro Abs: 12.1 10*3/uL — ABNORMAL HIGH (ref 1.7–7.7)
PLATELETS: 313 10*3/uL (ref 150–400)
RBC: 2.33 MIL/uL — ABNORMAL LOW (ref 4.22–5.81)
RDW: 17.7 % — AB (ref 11.5–15.5)
WBC: 15.1 10*3/uL — AB (ref 4.0–10.5)

## 2017-04-22 LAB — MAGNESIUM: MAGNESIUM: 1.6 mg/dL — AB (ref 1.7–2.4)

## 2017-04-22 LAB — PHOSPHORUS: PHOSPHORUS: 4.4 mg/dL (ref 2.5–4.6)

## 2017-04-22 MED ORDER — POTASSIUM CHLORIDE CRYS ER 20 MEQ PO TBCR
40.0000 meq | EXTENDED_RELEASE_TABLET | Freq: Two times a day (BID) | ORAL | Status: AC
  Administered 2017-04-22 – 2017-04-23 (×2): 40 meq via ORAL
  Filled 2017-04-22 (×2): qty 2

## 2017-04-22 NOTE — Progress Notes (Signed)
[facility] ROUNDING NOTE   Subjective:   Interval History: Patient continue to do well this morning. No acute complaints. Has been working with PT/OT and tolerating therapy well. Breathing is improving. Patient is ambulating.  Objective:  Vital signs in last 24 hours:  Temp:  [97.2 F (36.2 C)-98.8 F (37.1 C)] 98.8 F (37.1 C) (11/07 0812) Pulse Rate:  [96-155] 102 (11/07 1000) Resp:  [19-33] 19 (11/07 1000) BP: (111-165)/(48-110) 132/85 (11/07 0812) SpO2:  [89 %-96 %] 93 % (11/07 1000) Weight:  [143 lb 11.8 oz (65.2 kg)] 143 lb 11.8 oz (65.2 kg) (11/07 0432)  Weight change: -11.7 oz (-2.6 kg) Filed Weights   04/20/17 1400 04/21/17 0800 04/22/17 0432  Weight: 151 lb 14.4 oz (68.9 kg) 146 lb 2.6 oz (66.3 kg) 143 lb 11.8 oz (65.2 kg)    Intake/Output: I/O last 3 completed shifts: In: 1164 [P.O.:1064; IV Piggyback:100] Out: 1595 [Urine:1175; Drains:20; Stool:400]    Intake/Output Summary (Last 24 hours) at 04/22/2017 1156 Last data filed at 04/22/2017 1110 Gross per 24 hour  Intake 697 ml  Output 925 ml  Net -228 ml      Intake/Output this shift:  Total I/O In: 240 [P.O.:240] Out: -   Gen: no acute distress, in bed answering questions appropriatley and able to participate in exam. HEENT: R nontunneled HD catheter in place CVS: RRR, no murmur appreciated  Resp: breath sounds coarse rhonchi but improved NUU:[id number], hypoactive bowel sounds Ext: no peripheral edema, some dependent sacral edema    Basic Metabolic Panel: Recent Labs  Lab 04/16/17 0411 04/16/17 1505 04/17/17 0414 04/17/17 1608 04/17/17 2339 04/18/17 0440 04/19/17 0336 04/20/17 0444 04/21/17 1133  NA 137 138 137 137 133* 135 131* 128* 131*  K 3.9 4.0 3.9 4.2 3.7 3.7 3.3* 2.9* 3.3*  CL 105 105 104 102 99* 99* 96* 93* 95*  CO2 24 26 27 27 25 25 26 23 25   GLUCOSE 155* 155* 118* 102* 99 100* 99 65 109*  BUN 37* 28* 19 16 20 20 12 19  21*  CREATININE 3.36* 2.46* 1.90* 1.84*  2.40* 2.64* 1.99* 2.79* 3.24*  CALCIUM 7.9* 7.8* 7.8* 7.9* 7.4* 7.6* 7.4* 7.3* 7.7*  MG 2.0  --  2.2  --  2.0 1.9 1.8  --   --   PHOS 2.1* 1.7* 1.9* 2.8  --  3.9 4.1  --   --     Liver Function Tests: Recent Labs  Lab 04/15/17 1700 04/16/17 0411 04/16/17 1505 04/17/17 0414 04/17/17 1608  AST  --  20  --   --   --   ALT  --  11*  --   --   --   ALKPHOS  --  92  --   --   --   BILITOT  --  0.4  --   --   --   PROT  --  5.5*  --   --   --   ALBUMIN 1.4* 1.4* 1.4* 1.5* 1.7*   No results for input(s): LIPASE, AMYLASE in the last 168 hours. No results for input(s): AMMONIA in the last 168 hours.  CBC: Recent Labs  Lab 04/16/17 0411 04/17/17 0414 04/18/17 0440 04/19/17 0336 04/20/17 0444 04/21/17 1133  WBC 14.0* 15.5* 22.5* 25.1* 21.2* 17.4*  NEUTROABS 12.1*  --   --   --   --   --   HGB 8.2* 8.4* 7.7* 7.0* 7.3* 7.7*  HCT 24.5* 25.2* 23.1* 21.0* 21.8* 23.3*  MCV 93.9  95.1 93.5 93.3 93.2 94.0  PLT 233 215 267 241 254 284    Cardiac Enzymes: No results for input(s): CKTOTAL, CKMB, CKMBINDEX, TROPONINI in the last 168 hours.  BNP: Invalid input(s): POCBNP  CBG: Recent Labs  Lab 04/21/17 1636 04/21/17 1937 04/22/17 0006 04/22/17 0433 04/22/17 0814  GLUCAP 130* 105* 99 104* 25    Microbiology: Results for orders placed or performed during the hospital encounter of 04/11/17  MRSA PCR Screening     Status: None   Collection Time: 04/11/17  3:54 PM  Result Value Ref Range Status   MRSA by PCR NEGATIVE NEGATIVE Final    Comment:        The GeneXpert MRSA Assay (FDA approved for NASAL specimens only), is one component of a comprehensive MRSA colonization surveillance program. It is not intended to diagnose MRSA infection nor to guide or monitor treatment for MRSA infections.     Coagulation Studies: No results for input(s): LABPROT, INR in the last 72 hours.  Urinalysis: No results for input(s): COLORURINE, LABSPEC, PHURINE, GLUCOSEU, HGBUR,  BILIRUBINUR, KETONESUR, PROTEINUR, UROBILINOGEN, NITRITE, LEUKOCYTESUR in the last 72 hours.  Invalid input(s): APPERANCEUR    Imaging: Dg Chest Port 1 View  Result Date: 04/21/2017 CLINICAL DATA:  56 year old male with pleural effusion. Subsequent encounter. EXAM: PORTABLE CHEST 1 VIEW COMPARISON:  04/17/2017 chest x-ray. FINDINGS: Right-sided central line tip distal superior vena cava level. Left central line, endotracheal tube and nasogastric tube and been removed. Increase in size of right-sided pleural effusion now moderately large. Asymmetric airspace disease has progressed and may represent pulmonary edema. Limited for evaluating for underlying infiltrate or mass. Heart size within normal limits. Calcified aorta. IMPRESSION: Left central line, endotracheal tube and nasogastric tube and been removed. Increase in size of right-sided pleural effusion now moderately large. Asymmetric airspace disease has progressed and may represent pulmonary edema. Limited for evaluating for possibly of underlying infiltrate or mass. Electronically Signed   By: Genia Del M.D.   On: 04/21/2017 10:29     Medications:   . sodium chloride    . sodium chloride 10 mL/hr at 04/19/17 1900  . sodium chloride    . sodium chloride    . dexmedetomidine (PRECEDEX) IV infusion Stopped (04/17/17 1700)  . feeding supplement (VITAL AF 1.2 CAL) Stopped (04/17/17 0905)  . piperacillin-tazobactam (ZOSYN)  IV Stopped (04/22/17 0308)   . darbepoetin (ARANESP) injection - DIALYSIS  200 mcg Intravenous Q Mon-HD  . feeding supplement (ENSURE ENLIVE)  237 mL Oral BID BM  . folic acid  1 mg Oral QHS  . heparin subcutaneous  5,000 Units Subcutaneous Q8H  . insulin aspart  0-9 Units Subcutaneous Q4H  . mouth rinse  15 mL Mouth Rinse BID  . pantoprazole  40 mg Oral QHS  . potassium chloride  40 mEq Oral BID  . sodium chloride flush  10-40 mL Intracatheter Q12H  . thiamine  100 mg Oral QHS  . traZODone  50 mg Oral QHS    sodium chloride, sodium chloride, sodium chloride, alteplase, fentaNYL (SUBLIMAZE) injection, heparin, heparin, heparin, ipratropium-albuterol, lidocaine (PF), lidocaine-prilocaine, metoprolol tartrate, ondansetron (ZOFRAN) IV, pentafluoroprop-tetrafluoroeth, sodium chloride flush  Assessment/ Plan:   AKI (unknown b/l) - Baseline Cr unknown.  Initially on CRRT,  tolerated IHD. Will follow up on am BMP. UOP 1.1L and not currently on lasix . Will continue to monitor for recovery still no indication for dialysis.  Increasing pleural effusion   Patient has a significant smoking history 150 pack year  history. Lung         exam is consistent with rhonchi throughout both lung field. Concern           for possible malignancy given history.   --Consider IR consult for diagnostic thoracentesis, with fluid studies           (follow up on cytology) transudative vs exudative.      Hypokalemia- Will follow up on am BMP and replete as needed.  Anemia of chronic disease - initiated po iron, Aranesp 200 mcg q Monday VDRF - s/p extubation, resp status improving Diverticular abscess: s/p drain placed by IR, on Zosyn, repeat imaging per primary. Seen by surgery with recommendations for medical management and possible surgery in 6-8 weeks. Hypophosphatemia: Resolved    LOS: [place], MD [place], PGY-2  @TODAY @11 :56 AM

## 2017-04-22 NOTE — Progress Notes (Signed)
Subjective/Chief Complaint: Complains that he wants to poop more   Objective: Vital signs in last 24 hours: Temp:  [97.2 F (36.2 C)-98.8 F (37.1 C)] 98.8 F (37.1 C) (11/07 0812) Pulse Rate:  [96-155] 96 (11/07 0812) Resp:  [19-33] 20 (11/07 0812) BP: (111-165)/(48-110) 132/85 (11/07 0812) SpO2:  [89 %-96 %] 96 % (11/07 0812) Weight:  [65.2 kg (143 lb 11.8 oz)] 65.2 kg (143 lb 11.8 oz) (11/07 0432) Last BM Date: 04/21/17  Intake/Output from previous day: 11/06 0701 - 11/07 0700 In: 914 [P.O.:814; IV Piggyback:100] Out: 2025 [Urine:1175; Stool:300] Intake/Output this shift: No intake/output data recorded.  General appearance: alert, cooperative and cachectic Resp: clear to auscultation bilaterally Cardio: regular rate and rhythm GI: soft, mild tenderness and distension. having loose stools. drain output similar to stool  Lab Results:  Recent Labs    04/20/17 0444 04/21/17 1133  WBC 21.2* 17.4*  HGB 7.3* 7.7*  HCT 21.8* 23.3*  PLT 254 284   BMET Recent Labs    04/20/17 0444 04/21/17 1133  NA 128* 131*  K 2.9* 3.3*  CL 93* 95*  CO2 23 25  GLUCOSE 65 109*  BUN 19 21*  CREATININE 2.79* 3.24*  CALCIUM 7.3* 7.7*   PT/INR No results for input(s): LABPROT, INR in the last 72 hours. ABG No results for input(s): PHART, HCO3 in the last 72 hours.  Invalid input(s): PCO2, PO2  Studies/Results: Dg Chest Port 1 View  Result Date: 04/21/2017 CLINICAL DATA:  56 year old male with pleural effusion. Subsequent encounter. EXAM: PORTABLE CHEST 1 VIEW COMPARISON:  04/17/2017 chest x-ray. FINDINGS: Right-sided central line tip distal superior vena cava level. Left central line, endotracheal tube and nasogastric tube and been removed. Increase in size of right-sided pleural effusion now moderately large. Asymmetric airspace disease has progressed and may represent pulmonary edema. Limited for evaluating for underlying infiltrate or mass. Heart size within normal limits.  Calcified aorta. IMPRESSION: Left central line, endotracheal tube and nasogastric tube and been removed. Increase in size of right-sided pleural effusion now moderately large. Asymmetric airspace disease has progressed and may represent pulmonary edema. Limited for evaluating for possibly of underlying infiltrate or mass. Electronically Signed   By: Genia Del M.D.   On: 04/21/2017 10:29    Anti-infectives: Anti-infectives (From admission, onward)   Start     Dose/Rate Route Frequency Ordered Stop   04/18/17 1800  piperacillin-tazobactam (ZOSYN) IVPB 3.375 g     3.375 g 12.5 mL/hr over 240 Minutes Intravenous Every 12 hours 04/18/17 1032     04/18/17 1400  piperacillin-tazobactam (ZOSYN) IVPB 3.375 g  Status:  Discontinued     3.375 g 12.5 mL/hr over 240 Minutes Intravenous Every 8 hours 04/18/17 1031 04/18/17 1032   04/17/17 1200  piperacillin-tazobactam (ZOSYN) IVPB 3.375 g  Status:  Discontinued     3.375 g 100 mL/hr over 30 Minutes Intravenous Every 6 hours 04/17/17 1030 04/18/17 1031   04/17/17 1100  piperacillin-tazobactam (ZOSYN) IVPB 3.375 g  Status:  Discontinued     3.375 g 12.5 mL/hr over 240 Minutes Intravenous Every 8 hours 04/17/17 1019 04/17/17 1030   04/15/17 2125  Ampicillin-Sulbactam (UNASYN) 3 g in sodium chloride 0.9 % 100 mL IVPB  Status:  Discontinued     3 g 200 mL/hr over 30 Minutes Intravenous Every 8 hours 04/15/17 1529 04/17/17 1019   04/15/17 1100  ampicillin-sulbactam (UNASYN) 1.5 g in sodium chloride 0.9 % 50 mL IVPB  Status:  Discontinued     1.5  g 100 mL/hr over 30 Minutes Intravenous Every 12 hours 04/15/17 0937 04/15/17 1529   04/13/17 2200  levofloxacin (LEVAQUIN) IVPB 500 mg  Status:  Discontinued     500 mg 100 mL/hr over 60 Minutes Intravenous Every 48 hours 04/12/17 0114 04/12/17 0846   04/13/17 1800  piperacillin-tazobactam (ZOSYN) IVPB 2.25 g  Status:  Discontinued     2.25 g 100 mL/hr over 30 Minutes Intravenous Every 8 hours 04/13/17 1050  04/15/17 0937   04/12/17 0200  piperacillin-tazobactam (ZOSYN) IVPB 3.375 g  Status:  Discontinued     3.375 g 12.5 mL/hr over 240 Minutes Intravenous Every 8 hours 04/12/17 0103 04/13/17 1050   04/12/17 0000  levofloxacin (LEVAQUIN) IVPB 750 mg  Status:  Discontinued     750 mg 100 mL/hr over 90 Minutes Intravenous Every 48 hours 04/11/17 2230 04/12/17 0114      Assessment/Plan: s/p * No surgery found * Advance diet  Continue drain and abx for perforation PT Long term plan would be to control leak and keep on abx. Hopefully have definitive surgery in 6-8 weeks  LOS: 11 days    TOTH III,Uchenna Rappaport S 04/22/2017

## 2017-04-22 NOTE — Progress Notes (Signed)
PROGRESS NOTE    Dominic Phillips  PYK:[id number] DOB: April 29, 1961 DOA: 04/11/2017 PCP: No primary care provider on file.   Brief Narrative:  Dominic Phillips is a 56 y.o. 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 Transferred from [facility] for management of ARDS and progressive renal insufficiency. This gentleman has a long history of heavy ETOH abuse (~ 18 beers per day) and presented to [place] on 04/02/2017 with a c/o of abdominal pain without fevers, leukocytosis or diarrhea. An abd CT revealed pericolonic inflammation/fluid collection, for which he was admitted for diverticular abscess. Was consulted by Gen Surg who suggested placement of a catheter by interventional radiology; subsequent cultures grew E. Coli, for which Zosyn was initiated. Hospital course was complicated by the development of DTs, treated with 1 beer tid and Ativan prn. He slowly improved but worsened on 10/23 with increased WBC and progressive hypoxemia. Repeat abdominal CT showed improving abscess but developing SBO and multilobar pneumonia. Hypoxemia could not be corrected with BiPAP, along with worsening renal function such that he was intubated on 10/25 and required pressors for hemodynamic support. CT chest/abdomen/pelvis on 10/26 showed bilateral pneumonia with volume overload; no change in the RLQ percutaneous drain catheter. With rising creat to 3.1 and 290 cc U/O over 24 hours, transfer was requested.   He required intubation from 10/27 to 11/2 for ARDS and aspiration pneumonia and pressors for circulatory shock in ICU. He was receiving CRRT for AKI and has tolerated IHD x1. Worsening leukocytosis possibly secondary abdominal abscess. Will order Thoracentesis for worsening Right Pleural Effusion.   Assessment & Plan:   Active Problems:   Acute respiratory distress syndrome (ARDS) (HCC)   Acute kidney injury ([place])   Pressure injury of skin   Colonic diverticular abscess   COPD  (chronic obstructive pulmonary disease) (HCC)   Dyspnea   HCAP (healthcare-associated pneumonia)   Acute blood loss anemia   Anemia of chronic disease   ETOH abuse   Tobacco abuse   Tachycardia   Tachypnea   Leukocytosis   Post-operative pain  Acute respiratory distress syndrome/VDRF s/p Extubation 11/2 Seen on chest x-ray but possibly secondary to volume overload in setting of acute kidney injury Patient required intubation from 10/27 to 11/2.  -C/w Supplemental O2 via [place]  Bilateral pleural effusions with Right worse than Left -Patient still on oxygen. No respiratory distress -Nephrology recommendations appreciated -Will order Thoracentesis  -Repeat CXR in AM   Aspiration pneumonia -Treated with Zosyn. Improved. Course completed. Mild sputum production which has improved. Chest x-ray with ?progression. -Likely more secondary to effusion.  Diverticular abscess -S/p drain. On zosyn per ID. Leukocytosis improved today. CT scan shows minimally improved abscess that is not communicating with drain. -general surgery recommendations appreciated;Plan for now is continue Drain and Abx for perforation and long term plan is to control leak and keep on Abx and have definitive Surgery in 6-8 weeks -continue Zosyn  Abdominal wound drainage -per general surgery recommendations as above  Leukocytosis -Possibly secondary to abdominal abscess. Afebrile. -trend CBC -management per above; WBC improved from 17.4 -> 15.1  Acute kidney injury -Patient initially with oliguria requiring CRRT. Had IHD on 11/3 without issue -nephrology recommendations: IHD -Continue to Monitor and no dialysis indicated today   COPD -Patient states he has quit smoking. Mild cough -continue Duoneb prn  Circulatory shock -Patient required Levophed and was weaned off on 04/17/17. -Normotensive.  Ethanol abuse -Out of window for withdrawals.  Hypokalemia -supplement with  oral potassium 40 mEQ  BID  Insomnia -continue trazodone -discontinue Ambien  DVT prophylaxis: Heparin 5,000 units sq q8h Code Status: Partial Code, DNI Family Communication: No family present at bedside Disposition Plan: Remain Inpatient   Consultants:   General Surgery  PCCM  Nephrology  CIR   Procedures:  CRRT IHD   Antimicrobials: Anti-infectives (From admission, onward)   Start     Dose/Rate Route Frequency Ordered Stop   04/18/17 1800  piperacillin-tazobactam (ZOSYN) IVPB 3.375 g     3.375 g 12.5 mL/hr over 240 Minutes Intravenous Every 12 hours 04/18/17 1032     04/18/17 1400  piperacillin-tazobactam (ZOSYN) IVPB 3.375 g  Status:  Discontinued     3.375 g 12.5 mL/hr over 240 Minutes Intravenous Every 8 hours 04/18/17 1031 04/18/17 1032   04/17/17 1200  piperacillin-tazobactam (ZOSYN) IVPB 3.375 g  Status:  Discontinued     3.375 g 100 mL/hr over 30 Minutes Intravenous Every 6 hours 04/17/17 1030 04/18/17 1031   04/17/17 1100  piperacillin-tazobactam (ZOSYN) IVPB 3.375 g  Status:  Discontinued     3.375 g 12.5 mL/hr over 240 Minutes Intravenous Every 8 hours 04/17/17 1019 04/17/17 1030   04/15/17 2125  Ampicillin-Sulbactam (UNASYN) 3 g in sodium chloride 0.9 % 100 mL IVPB  Status:  Discontinued     3 g 200 mL/hr over 30 Minutes Intravenous Every 8 hours 04/15/17 1529 04/17/17 1019   04/15/17 1100  ampicillin-sulbactam (UNASYN) 1.5 g in sodium chloride 0.9 % 50 mL IVPB  Status:  Discontinued     1.5 g 100 mL/hr over 30 Minutes Intravenous Every 12 hours 04/15/17 0937 04/15/17 1529   04/13/17 2200  levofloxacin (LEVAQUIN) IVPB 500 mg  Status:  Discontinued     500 mg 100 mL/hr over 60 Minutes Intravenous Every 48 hours 04/12/17 0114 04/12/17 0846   04/13/17 1800  piperacillin-tazobactam (ZOSYN) IVPB 2.25 g  Status:  Discontinued     2.25 g 100 mL/hr over 30 Minutes Intravenous Every 8 hours 04/13/17 1050 04/15/17 0937   04/12/17 0200  piperacillin-tazobactam (ZOSYN) IVPB 3.375 g   Status:  Discontinued     3.375 g 12.5 mL/hr over 240 Minutes Intravenous Every 8 hours 04/12/17 0103 04/13/17 1050   04/12/17 0000  levofloxacin (LEVAQUIN) IVPB 750 mg  Status:  Discontinued     750 mg 100 mL/hr over 90 Minutes Intravenous Every 48 hours 04/11/17 2230 04/12/17 0114     Subjective: Seen and examined and was excited to work with Therapy. States he made a lot of "stupid decisions" in his life. No CP. Denied any other complaints and is still SOB slightly on movement.   Objective: Vitals:   04/21/17 1544 04/21/17 1935 04/22/17 0005 04/22/17 0432  BP: (!) 165/110 (!) 111/48 121/81 116/80  Pulse: (!) 155 (!) 109 (!) 111 98  Resp: (!) 33 19 (!) 23 (!) 21  Temp: 98.1 F (36.7 C) 98.6 F (37 C) 98.3 F (36.8 C) 98.2 F (36.8 C)  TempSrc: Oral Oral Oral Oral  SpO2: (!) 89% (!) 89% 90% 94%  Weight:    65.2 kg (143 lb 11.8 oz)  Height:        Intake/Output Summary (Last 24 hours) at 04/22/2017 0734 Last data filed at 04/22/2017 0445 Gross per 24 hour  Intake 914 ml  Output 1275 ml  Net -361 ml   Filed Weights   04/20/17 1400 04/21/17 0800 04/22/17 0432  Weight: 68.9 kg (151 lb 14.4 oz) 66.3 kg (146 lb  2.6 oz) 65.2 kg (143 lb 11.8 oz)   Examination: Physical Exam:  Constitutional: Thin Caucasian male in NAD and appears calm and comfortable Eyes: Lids and conjunctivae normal, sclerae anicteric  ENMT: External Ears, Nose appear normal. Grossly normal hearing. Mucous membranes are moist.  Neck: Appears normal, supple, no cervical masses, normal ROM, no appreciable thyromegaly, no JVD; Has Right Temp HD Cath Respiratory: Diminished to auscultation bilaterall with Right worse than Left. Had some crackles. Normal respiratory effort and patient is not tachypenic. No accessory muscle use.  Cardiovascular: RRR, no murmurs / rubs / gallops. S1 and S2 auscultated. No extremity edema. Abdomen: Soft, non-tender, non-distended. No masses palpated. No appreciable  hepatosplenomegaly. Bowel sounds positive. Right LUQ Drain. GU: Deferred. Has foley and Dignisheild.  Musculoskeletal: No clubbing / cyanosis of digits/nails. No joint deformity upper and lower extremities  Skin: No rashes, lesions, ulcers on a limited skin eval. No induration; Warm and dry.  Neurologic: CN 2-12 grossly intact with no focal deficits. Romberg sign and cerebellar reflexes not assessed.  Psychiatric: Normal judgment and insight. Alert and oriented x 3. Normal mood and appropriate affect.   Data Reviewed: I have personally reviewed following labs and imaging studies  CBC: Recent Labs  Lab 04/16/17 0411 04/17/17 0414 04/18/17 0440 04/19/17 0336 04/20/17 0444 04/21/17 1133  WBC 14.0* 15.5* 22.5* 25.1* 21.2* 17.4*  NEUTROABS 12.1*  --   --   --   --   --   HGB 8.2* 8.4* 7.7* 7.0* 7.3* 7.7*  HCT 24.5* 25.2* 23.1* 21.0* 21.8* 23.3*  MCV 93.9 95.1 93.5 93.3 93.2 94.0  PLT 233 215 267 241 254 539   Basic Metabolic Panel: Recent Labs  Lab 04/16/17 0411 04/16/17 1505 04/17/17 0414 04/17/17 1608 04/17/17 2339 04/18/17 0440 04/19/17 0336 04/20/17 0444 04/21/17 1133  NA 137 138 137 137 133* 135 131* 128* 131*  K 3.9 4.0 3.9 4.2 3.7 3.7 3.3* 2.9* 3.3*  CL 105 105 104 102 99* 99* 96* 93* 95*  CO2 24 26 27 27 25 25 26 23 25   GLUCOSE 155* 155* 118* 102* 99 100* 99 65 109*  BUN 37* 28* 19 16 20 20 12 19  21*  CREATININE 3.36* 2.46* 1.90* 1.84* 2.40* 2.64* 1.99* 2.79* 3.24*  CALCIUM 7.9* 7.8* 7.8* 7.9* 7.4* 7.6* 7.4* 7.3* 7.7*  MG 2.0  --  2.2  --  2.0 1.9 1.8  --   --   PHOS 2.1* 1.7* 1.9* 2.8  --  3.9 4.1  --   --    GFR: Estimated Creatinine Clearance: 24 mL/min (A) (by C-G formula based on SCr of 3.24 mg/dL (H)). Liver Function Tests: Recent Labs  Lab 04/15/17 1700 04/16/17 0411 04/16/17 1505 04/17/17 0414 04/17/17 1608  AST  --  20  --   --   --   ALT  --  11*  --   --   --   ALKPHOS  --  92  --   --   --   BILITOT  --  0.4  --   --   --   PROT  --  5.5*   --   --   --   ALBUMIN 1.4* 1.4* 1.4* 1.5* 1.7*   No results for input(s): LIPASE, AMYLASE in the last 168 hours. No results for input(s): AMMONIA in the last 168 hours. Coagulation Profile: No results for input(s): INR, PROTIME in the last 168 hours. Cardiac Enzymes: No results for input(s): CKTOTAL, CKMB, CKMBINDEX, TROPONINI in  the last 168 hours. BNP (last 3 results) No results for input(s): PROBNP in the last 8760 hours. HbA1C: No results for input(s): HGBA1C in the last 72 hours. CBG: Recent Labs  Lab 04/21/17 1224 04/21/17 1636 04/21/17 1937 04/22/17 0006 04/22/17 0433  GLUCAP 104* 130* 105* 99 104*   Lipid Profile: No results for input(s): CHOL, HDL, LDLCALC, TRIG, CHOLHDL, LDLDIRECT in the last 72 hours. Thyroid Function Tests: No results for input(s): TSH, T4TOTAL, FREET4, T3FREE, THYROIDAB in the last 72 hours. Anemia Panel: No results for input(s): VITAMINB12, FOLATE, FERRITIN, TIBC, IRON, RETICCTPCT in the last 72 hours. Sepsis Labs: No results for input(s): PROCALCITON, LATICACIDVEN in the last 168 hours.  No results found for this or any previous visit (from the past 240 hour(s)).   Radiology Studies: Dg Chest Port 1 View  Result Date: 04/21/2017 CLINICAL DATA:  56 year old male with pleural effusion. Subsequent encounter. EXAM: PORTABLE CHEST 1 VIEW COMPARISON:  04/17/2017 chest x-ray. FINDINGS: Right-sided central line tip distal superior vena cava level. Left central line, endotracheal tube and nasogastric tube and been removed. Increase in size of right-sided pleural effusion now moderately large. Asymmetric airspace disease has progressed and may represent pulmonary edema. Limited for evaluating for underlying infiltrate or mass. Heart size within normal limits. Calcified aorta. IMPRESSION: Left central line, endotracheal tube and nasogastric tube and been removed. Increase in size of right-sided pleural effusion now moderately large. Asymmetric airspace  disease has progressed and may represent pulmonary edema. Limited for evaluating for possibly of underlying infiltrate or mass. Electronically Signed   By: Genia Del M.D.   On: 04/21/2017 10:29   Scheduled Meds: . darbepoetin (ARANESP) injection - DIALYSIS  200 mcg Intravenous Q Mon-HD  . feeding supplement (ENSURE ENLIVE)  237 mL Oral BID BM  . folic acid  1 mg Oral QHS  . heparin subcutaneous  5,000 Units Subcutaneous Q8H  . insulin aspart  0-9 Units Subcutaneous Q4H  . mouth rinse  15 mL Mouth Rinse BID  . pantoprazole  40 mg Oral QHS  . sodium chloride flush  10-40 mL Intracatheter Q12H  . thiamine  100 mg Oral QHS  . traZODone  50 mg Oral QHS   Continuous Infusions: . sodium chloride    . sodium chloride 10 mL/hr at 04/19/17 1900  . sodium chloride    . sodium chloride    . dexmedetomidine (PRECEDEX) IV infusion Stopped (04/17/17 1700)  . feeding supplement (VITAL AF 1.2 CAL) Stopped (04/17/17 0905)  . piperacillin-tazobactam (ZOSYN)  IV 3.375 g (04/21/17 2308)     LOS: 11 days   Kerney Elbe, DO [facility] Pager [phone]  If 7PM-7AM, please contact night-coverage [email or web address] Password [facility] 04/22/2017, 7:34 AM

## 2017-04-22 NOTE — Progress Notes (Signed)
Physical Therapy Treatment Patient Details Name: Dominic Phillips MRN: [id number] DOB: 1961-02-05 Today's Date: 04/22/2017    History of Present Illness Pt admitted to [facility] and transferred on 04/02/17 with c/o abdominal pain.  Pt presented with ETOH withdrawls, liver abscess and SBO on TPN.  10/23 presents with hypoxia and increased WBC count, 10/25 intubated, 10/26 scans show PNA and fluid overload, patient extubated on 04/17/17.  Chart review shows COPD, VDRF, anemia, AKI and hypokalemia in addition to previous listed statements.  PMH: ETOH abuse.      PT Comments    Pt in bed upon arrival. Pt required increased time and VCs to sit EOB with HOB elevated. VCs needed to correct posterior lean. Multiple cues to push off from bed during sit to stand. Pt able to ambulate with 4 L/min O2 with O2 sat > 90%. Required cues for proximity to RW and posture. Will benefit from skilled PT to increase functional independence and strength.  Follow Up Recommendations  CIR     Equipment Recommendations  Rolling walker with 5" wheels;3in1 (PT)    Recommendations for Other Services       Precautions / Restrictions Precautions Precautions: Fall Precaution Comments: Drain, Flexiseal, catheter Restrictions Weight Bearing Restrictions: No    Mobility  Bed Mobility Overal bed mobility: Needs Assistance Bed Mobility: Supine to Sit     Supine to sit: Min assist     General bed mobility comments: Supervision for safety and lines. Increased time to scoot to EOB and for elevating trunk from raised HOB.  Transfers Overall transfer level: Needs assistance Equipment used: Rolling walker (2 wheeled) Transfers: Sit to/from Stand Sit to Stand: +2 physical assistance         General transfer comment: Pt required extra time to sit<>stand. Min assist for power up. Multiple VCs to push off from bed not RW.  Ambulation/Gait Ambulation/Gait assistance: Min assist;+2 safety/equipment(Chair  follow) Ambulation Distance (Feet): 300 Feet Assistive device: Rolling walker (2 wheeled) Gait Pattern/deviations: Step-through pattern;Decreased step length - right;Decreased step length - left;Drifts right/left;Narrow base of support Gait velocity: slow   General Gait Details: Pt with no knee buckling. Pt's O2 sat stayed > 90% while ambulating on 4 L/min [place]. VCs for posture, neutral head position, and proximity to walker. Pt required several rest breaks and was fatigued by end of ambulation.    Stairs            Wheelchair Mobility    Modified Rankin (Stroke Patients Only)       Balance Overall balance assessment: Needs assistance Sitting-balance support: No upper extremity supported;Feet supported Sitting balance-Leahy Scale: Fair Sitting balance - Comments: Pt with right lateral lean and posterior lean needing VCs to sit upright.  Postural control: Posterior lean;Right lateral lean Standing balance support: Bilateral upper extremity supported Standing balance-Leahy Scale: Poor                              Cognition Arousal/Alertness: Awake/alert Behavior During Therapy: WFL for tasks assessed/performed Overall Cognitive Status: Within Functional Limits for tasks assessed                                        Exercises      General Comments        Pertinent Vitals/Pain Pain Assessment: 0-10 Pain Score: 8  Pain Location: anterior abdominal  area Pain Descriptors / Indicators: Aching;Grimacing;Guarding Pain Intervention(s): Limited activity within patient's tolerance;Monitored during session;Repositioned    Home Living                      Prior Function            PT Goals (current goals can now be found in the care plan section) Acute Rehab PT Goals Patient Stated Goal: to get home PT Goal Formulation: With patient Time For Goal Achievement: 05/02/17 Potential to Achieve Goals: Good Progress towards PT goals:  Progressing toward goals    Frequency    Min 3X/week      PT Plan Current plan remains appropriate    Co-evaluation              AM-PAC PT "6 Clicks" Daily Activity  Outcome Measure  Difficulty turning over in bed (including adjusting bedclothes, sheets and blankets)?: A Lot Difficulty moving from lying on back to sitting on the side of the bed? : A Lot Difficulty sitting down on and standing up from a chair with arms (e.g., wheelchair, bedside commode, etc,.)?: A Lot Help needed moving to and from a bed to chair (including a wheelchair)?: A Little Help needed walking in hospital room?: A Little Help needed climbing 3-5 steps with a railing? : Total 6 Click Score: 13    End of Session Equipment Utilized During Treatment: Oxygen;Gait belt Activity Tolerance: Patient tolerated treatment well Patient left: with call bell/phone within reach;in chair;with chair alarm set Nurse Communication: Mobility status PT Visit Diagnosis: Unsteadiness on feet (R26.81);Muscle weakness (generalized) (M62.81);Other abnormalities of gait and mobility (R26.89)     Time: 9741-6384 PT Time Calculation (min) (ACUTE ONLY): 36 min  Charges:  $Gait Training: 8-22 mins $Therapeutic Activity: 8-22 mins                    G Codes:       Dominic Phillips, SPTA   Dominic Phillips 04/22/2017, 1:39 PM

## 2017-04-23 ENCOUNTER — Inpatient Hospital Stay (HOSPITAL_COMMUNITY): Payer: Medicaid Other

## 2017-04-23 ENCOUNTER — Encounter (HOSPITAL_COMMUNITY): Payer: Self-pay | Admitting: Physician Assistant

## 2017-04-23 HISTORY — PX: IR THORACENTESIS ASP PLEURAL SPACE W/IMG GUIDE: IMG5380

## 2017-04-23 LAB — GLUCOSE, CAPILLARY
GLUCOSE-CAPILLARY: 89 mg/dL (ref 65–99)
GLUCOSE-CAPILLARY: 116 mg/dL — AB (ref 65–99)
Glucose-Capillary: 89 mg/dL (ref 65–99)
Glucose-Capillary: 86 mg/dL (ref 65–99)
Glucose-Capillary: 110 mg/dL — ABNORMAL HIGH (ref 65–99)

## 2017-04-23 LAB — GRAM STAIN

## 2017-04-23 LAB — CBC WITH DIFFERENTIAL/PLATELET
BASOS PCT: 0 %
Basophils Absolute: 0.1 10*3/uL (ref 0.0–0.1)
Eosinophils Absolute: 0.3 10*3/uL (ref 0.0–0.7)
Eosinophils Relative: 2 %
HEMATOCRIT: 20 % — AB (ref 39.0–52.0)
Hemoglobin: 6.9 g/dL — CL (ref 13.0–17.0)
LYMPHS PCT: 9 %
Lymphs Abs: 1.5 10*3/uL (ref 0.7–4.0)
MCH: 32.4 pg (ref 26.0–34.0)
MCHC: 34.5 g/dL (ref 30.0–36.0)
MCV: 93.9 fL (ref 78.0–100.0)
MONO ABS: 1.5 10*3/uL — AB (ref 0.1–1.0)
MONOS PCT: 9 %
NEUTROS ABS: 13.2 10*3/uL — AB (ref 1.7–7.7)
Neutrophils Relative %: 79 %
Platelets: 304 10*3/uL (ref 150–400)
RBC: 2.13 MIL/uL — ABNORMAL LOW (ref 4.22–5.81)
RDW: 17.7 % — AB (ref 11.5–15.5)
WBC: 16.6 10*3/uL — ABNORMAL HIGH (ref 4.0–10.5)

## 2017-04-23 LAB — ALBUMIN, PLEURAL OR PERITONEAL FLUID: Albumin, Fluid: <1 g/dL

## 2017-04-23 LAB — BODY FLUID CELL COUNT WITH DIFFERENTIAL
EOS FL: 0 %
LYMPHS FL: 48 %
MONOCYTE-MACROPHAGE-SEROUS FLUID: 24 % — AB (ref 50–90)
NEUTROPHIL FLUID: 28 % — AB (ref 0–25)
OTHER CELLS FL: 0 %
WBC FLUID: 163 uL (ref 0–1000)

## 2017-04-23 LAB — COMPREHENSIVE METABOLIC PANEL
ALT: 10 U/L — ABNORMAL LOW (ref 17–63)
ANION GAP: 10 (ref 5–15)
AST: 17 U/L (ref 15–41)
Albumin: 1.7 g/dL — ABNORMAL LOW (ref 3.5–5.0)
Alkaline Phosphatase: 58 U/L (ref 38–126)
BILIRUBIN TOTAL: 0.7 mg/dL (ref 0.3–1.2)
BUN: 22 mg/dL — ABNORMAL HIGH (ref 6–20)
CO2: 25 mmol/L (ref 22–32)
Calcium: 7.6 mg/dL — ABNORMAL LOW (ref 8.9–10.3)
Chloride: 95 mmol/L — ABNORMAL LOW (ref 101–111)
Creatinine, Ser: 3.42 mg/dL — ABNORMAL HIGH (ref 0.61–1.24)
GFR calc Af Amer: 22 mL/min — ABNORMAL LOW (ref >60)
GFR, EST NON AFRICAN AMERICAN: 19 mL/min — AB (ref >60)
Glucose, Bld: 95 mg/dL (ref 65–99)
POTASSIUM: 3.5 mmol/L (ref 3.5–5.1)
Sodium: 130 mmol/L — ABNORMAL LOW (ref 135–145)
TOTAL PROTEIN: 5.5 g/dL — AB (ref 6.5–8.1)

## 2017-04-23 LAB — PROTEIN, PLEURAL OR PERITONEAL FLUID

## 2017-04-23 LAB — PREALBUMIN: Prealbumin: 7.9 mg/dL — ABNORMAL LOW (ref 18–38)

## 2017-04-23 LAB — PHOSPHORUS: Phosphorus: 4.4 mg/dL (ref 2.5–4.6)

## 2017-04-23 LAB — GLUCOSE, PLEURAL OR PERITONEAL FLUID: Glucose, Fluid: 89 mg/dL

## 2017-04-23 LAB — LACTATE DEHYDROGENASE, PLEURAL OR PERITONEAL FLUID: LD, Fluid: 91 U/L — ABNORMAL HIGH (ref 3–23)

## 2017-04-23 LAB — PREPARE RBC (CROSSMATCH)

## 2017-04-23 LAB — MAGNESIUM: MAGNESIUM: 1.6 mg/dL — AB (ref 1.7–2.4)

## 2017-04-23 LAB — ABO/RH: ABO/RH(D): B POS

## 2017-04-23 MED ORDER — LIDOCAINE 2% (20 MG/ML) 5 ML SYRINGE
INTRAMUSCULAR | Status: AC
  Filled 2017-04-23: qty 10

## 2017-04-23 MED ORDER — DEXTROSE 5 % IV SOLN
120.0000 mg | Freq: Once | INTRAVENOUS | Status: AC
  Administered 2017-04-23: 120 mg via INTRAVENOUS
  Filled 2017-04-23: qty 10

## 2017-04-23 MED ORDER — SODIUM CHLORIDE 0.9 % IV SOLN
Freq: Once | INTRAVENOUS | Status: AC
  Administered 2017-04-23: 05:00:00 via INTRAVENOUS

## 2017-04-23 MED ORDER — PIPERACILLIN-TAZOBACTAM 3.375 G IVPB
3.3750 g | Freq: Two times a day (BID) | INTRAVENOUS | Status: DC
  Administered 2017-04-23 – 2017-04-27 (×8): 3.375 g via INTRAVENOUS
  Filled 2017-04-23 (×9): qty 50

## 2017-04-23 MED ORDER — MAGNESIUM SULFATE IN D5W 1-5 GM/100ML-% IV SOLN
1.0000 g | Freq: Once | INTRAVENOUS | Status: AC
  Administered 2017-04-23: 1 g via INTRAVENOUS
  Filled 2017-04-23: qty 100

## 2017-04-23 MED ORDER — LIDOCAINE HCL (PF) 1 % IJ SOLN
INTRAMUSCULAR | Status: DC | PRN
  Administered 2017-04-23: 10 mL

## 2017-04-23 NOTE — Progress Notes (Signed)
PROGRESS NOTE    Dominic Phillips  DUK:[id number] DOB: Dec 20, 1960 DOA: 04/11/2017 PCP: No primary care provider on file.   Brief Narrative:  Dominic Phillips is a 56 y.o. 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 Transferred from [facility] for management of ARDS and progressive renal insufficiency. This gentleman has a long history of heavy ETOH abuse (~ 18 beers per day) and presented to [facility] on 04/02/2017 with a c/o of abdominal pain without fevers, leukocytosis or diarrhea. An abd CT revealed pericolonic inflammation/fluid collection, for which he was admitted for diverticular abscess. Was consulted by Gen Surg who suggested placement of a catheter by interventional radiology; subsequent cultures grew E. Coli, for which Zosyn was initiated. Hospital course was complicated by the development of DTs, treated with 1 beer tid and Ativan prn. He slowly improved but worsened on 10/23 with increased WBC and progressive hypoxemia. Repeat abdominal CT showed improving abscess but developing SBO and multilobar pneumonia. Hypoxemia could not be corrected with BiPAP, along with worsening renal function such that he was intubated on 10/25 and required pressors for hemodynamic support. CT chest/abdomen/pelvis on 10/26 showed bilateral pneumonia with volume overload; no change in the RLQ percutaneous drain catheter. With rising creat to 3.1 and 290 cc U/O over 24 hours, transfer was requested.   He required intubation from 10/27 to 11/2 for ARDS and aspiration pneumonia and pressors for circulatory shock in ICU. He was receiving CRRT for AKI and has tolerated IHD x1. Worsening leukocytosis possibly secondary abdominal abscess. Pleural effusion also worsened so patient underwent Thoracentesis today. Blood Count dropped so he was given 1 unit of pRBC's. Nephrology giving 120 mg of IV Lasix today.   Assessment & Plan:   Active Problems:   Acute respiratory distress syndrome  (ARDS) (HCC)   Acute kidney injury ([place])   Pressure injury of skin   Colonic diverticular abscess   COPD (chronic obstructive pulmonary disease) (HCC)   Dyspnea   HCAP (healthcare-associated pneumonia)   Acute blood loss anemia   Anemia of chronic disease   ETOH abuse   Tobacco abuse   Tachycardia   Tachypnea   Leukocytosis   Post-operative pain  Acute respiratory distress syndrome/VDRF s/p Extubation 11/2 Seen on chest x-ray but possibly secondary to volume overload in setting of acute kidney injury Patient required intubation from 10/27 to 11/2.  -C/w Supplemental O2 via [place] -Ordered Thoracentesis and is to be done today  Bilateral pleural effusions with Right worse than Left -Patient still on oxygen. No respiratory distress -Nephrology recommendations appreciated -Thoracentesis done and drained 1.3 Liters -Follow up on Fluid Studies  -Repeat CXR showed successful Right Sided Thoracentesis. No more than a small amount of Pleural Fluid Remains. No post-procedure Pneumothorax  Aspiration Pneumonia -Treated with Zosyn. Improved. Course completed. Mild sputum production which has improved. Chest x-ray with ?progression. -Likely more secondary to effusion.  Diverticular Abscess -S/p drain. On zosyn per ID. Leukocytosis improved today. CT scan shows minimally improved abscess that is not communicating with drain. -General surgery recommendations appreciated; Plan for now is continue Drain and Abx for perforation and long term plan is to control leak and keep on Abx and have definitive Surgery in 6-8 weeks -Pert General Surgery will need to increase po intake/protein intake so they are checking Pre-Albuminl Will consult Nutritionist  -continue IV Zosyn  Abdominal wound drainage -Per general surgery recommendations as above  Leukocytosis -Possibly secondary to abdominal abscess. Afebrile. -trend CBC -management per above;  WBC went from 17.4 -> 15.1 -> 16.6  Acute Kidney  Injury, worseinging  -Patient initially with oliguria requiring CRRT. Had IHD on 11/3 without issue -nephrology recommendations: IHD -BUN/Cr went from 21/3.24 -> 22/3.31 -> 22/3.42 -Nephrology giving 120 mg of IV Lasix today -Continue to Monitor and no dialysis indicated today   COPD -Patient states he has quit smoking. Mild cough -continue Duoneb prn  Circulatory shock -Patient required Levophed and was weaned off on 04/17/17. -Normotensive.  Ethanol abuse -Out of window for Withdrawals.  Hypokalemia -Supplement with oral potassium 40 mEQ BID -K+ now improved to 3.5  Insomnia -Continue Trazodone -Discontinue Ambien  Normocytic Anemia  -Hb/Hct trended down from 7.7/23.3 -> 7.5/21.9 -> 6.9/20.0 -Transfuse 1 unit of pRBC's -Continue to Monitor for S/Sx of bleeding -Repeat CBC in AM   Hypomagnesemia -Patient's Mag Level was 1.6 this AM -Replete with IV Mag Sulfate 1 gram -Continue to Monitor and Replete as Necessary -Repeat Mag Level in AM   DVT prophylaxis: Heparin 5,000 units sq q8h Code Status: Partial Code, DNI Family Communication: No family present at bedside Disposition Plan: Remain Inpatient   Consultants:   General Surgery  PCCM  Nephrology  CIR   Procedures:  CRRT IHD   Antimicrobials: Anti-infectives (From admission, onward)   Start     Dose/Rate Route Frequency Ordered Stop   04/18/17 1800  piperacillin-tazobactam (ZOSYN) IVPB 3.375 g     3.375 g 12.5 mL/hr over 240 Minutes Intravenous Every 12 hours 04/18/17 1032     04/18/17 1400  piperacillin-tazobactam (ZOSYN) IVPB 3.375 g  Status:  Discontinued     3.375 g 12.5 mL/hr over 240 Minutes Intravenous Every 8 hours 04/18/17 1031 04/18/17 1032   04/17/17 1200  piperacillin-tazobactam (ZOSYN) IVPB 3.375 g  Status:  Discontinued     3.375 g 100 mL/hr over 30 Minutes Intravenous Every 6 hours 04/17/17 1030 04/18/17 1031   04/17/17 1100  piperacillin-tazobactam (ZOSYN) IVPB 3.375 g   Status:  Discontinued     3.375 g 12.5 mL/hr over 240 Minutes Intravenous Every 8 hours 04/17/17 1019 04/17/17 1030   04/15/17 2125  Ampicillin-Sulbactam (UNASYN) 3 g in sodium chloride 0.9 % 100 mL IVPB  Status:  Discontinued     3 g 200 mL/hr over 30 Minutes Intravenous Every 8 hours 04/15/17 1529 04/17/17 1019   04/15/17 1100  ampicillin-sulbactam (UNASYN) 1.5 g in sodium chloride 0.9 % 50 mL IVPB  Status:  Discontinued     1.5 g 100 mL/hr over 30 Minutes Intravenous Every 12 hours 04/15/17 0937 04/15/17 1529   04/13/17 2200  levofloxacin (LEVAQUIN) IVPB 500 mg  Status:  Discontinued     500 mg 100 mL/hr over 60 Minutes Intravenous Every 48 hours 04/12/17 0114 04/12/17 0846   04/13/17 1800  piperacillin-tazobactam (ZOSYN) IVPB 2.25 g  Status:  Discontinued     2.25 g 100 mL/hr over 30 Minutes Intravenous Every 8 hours 04/13/17 1050 04/15/17 0937   04/12/17 0200  piperacillin-tazobactam (ZOSYN) IVPB 3.375 g  Status:  Discontinued     3.375 g 12.5 mL/hr over 240 Minutes Intravenous Every 8 hours 04/12/17 0103 04/13/17 1050   04/12/17 0000  levofloxacin (LEVAQUIN) IVPB 750 mg  Status:  Discontinued     750 mg 100 mL/hr over 90 Minutes Intravenous Every 48 hours 04/11/17 2230 04/12/17 0114     Subjective: Seen and examined and stated he was doing ok. Had some SOB no CP. States he is still making urine. No lightheadedness. No other  complaints or concerns at this time.   Objective: Vitals:   04/22/17 2000 04/22/17 2351 04/23/17 0400 04/23/17 0430  BP: (!) 128/98 (!) 134/91 127/82   Pulse: (!) 104 (!) 104 (!) 117   Resp: (!) 25 (!) 21 (!) 23   Temp:  98 F (36.7 C) 98.1 F (36.7 C)   TempSrc:  Oral Oral   SpO2: 95% 96% 93%   Weight:    64.4 kg (141 lb 15.6 oz)  Height:        Intake/Output Summary (Last 24 hours) at 04/23/2017 0730 Last data filed at 04/23/2017 0600 Gross per 24 hour  Intake 1860 ml  Output 1525 ml  Net 335 ml   Filed Weights   04/21/17 0800 04/22/17 0432  04/23/17 0430  Weight: 66.3 kg (146 lb 2.6 oz) 65.2 kg (143 lb 11.8 oz) 64.4 kg (141 lb 15.6 oz)   Examination: Physical Exam:  Constitutional: Thin Caucasian male in NAD. Appears calm and comfortable  Eyes: Sclerae anicteric. Lids normal ENMT: External Ears and Nose appear normal. MMM   Neck: Appears normal. Supple and has Right Temp HD cathether in IJ no Respiratory: Diminished to Auscultation Bilaterally with Right being worse than Left. Has some diffuse crackles. Slightly increased respiratory effort.  Cardiovascular: Tachycardic but sinus. No appreciable m/r/g. No extremity edema Abdomen: Soft, NT, ND. Bowel sounds positive. Has Right Lower Quadrant Drain GU: Deferred. Has Foley and Dignisheild  Musculoskeletal: No contractures. No cyanosi Skin: Warm and Dry. No rashes noted on a limited skin eval Neurologic: CN 2-12 grossly intact. No appreciable focal deficits.  Psychiatric: Normal mood and affect. Intact judgement and insight. Awake and Alert.   Data Reviewed: I have personally reviewed following labs and imaging studies  CBC: Recent Labs  Lab 04/19/17 0336 04/20/17 0444 04/21/17 1133 04/22/17 1515 04/23/17 0307  WBC 25.1* 21.2* 17.4* 15.1* 16.6*  NEUTROABS  --   --   --  12.1* 13.2*  HGB 7.0* 7.3* 7.7* 7.5* 6.9*  HCT 21.0* 21.8* 23.3* 21.9* 20.0*  MCV 93.3 93.2 94.0 94.0 93.9  PLT 241 254 284 313 259   Basic Metabolic Panel: Recent Labs  Lab 04/17/17 1608 04/17/17 2339 04/18/17 0440 04/19/17 0336 04/20/17 0444 04/21/17 1133 04/22/17 1515 04/23/17 0307  NA 137 133* 135 131* 128* 131* 131* 130*  K 4.2 3.7 3.7 3.3* 2.9* 3.3* 3.4* 3.5  CL 102 99* 99* 96* 93* 95* 96* 95*  CO2 27 25 25 26 23 25 26 25   GLUCOSE 102* 99 100* 99 65 109* 94 95  BUN 16 20 20 12 19  21* 22* 22*  CREATININE 1.84* 2.40* 2.64* 1.99* 2.79* 3.24* 3.31* 3.42*  CALCIUM 7.9* 7.4* 7.6* 7.4* 7.3* 7.7* 7.7* 7.6*  MG  --  2.0 1.9 1.8  --   --  1.6* 1.6*  PHOS 2.8  --  3.9 4.1  --   --  4.4 4.4     GFR: Estimated Creatinine Clearance: 22.5 mL/min (A) (by C-G formula based on SCr of 3.42 mg/dL (H)). Liver Function Tests: Recent Labs  Lab 04/16/17 1505 04/17/17 0414 04/17/17 1608 04/22/17 1515 04/23/17 0307  AST  --   --   --  19 17  ALT  --   --   --  9* 10*  ALKPHOS  --   --   --  67 58  BILITOT  --   --   --  0.7 0.7  PROT  --   --   --  6.0* 5.5*  ALBUMIN 1.4* 1.5* 1.7* 1.8* 1.7*   No results for input(s): LIPASE, AMYLASE in the last 168 hours. No results for input(s): AMMONIA in the last 168 hours. Coagulation Profile: No results for input(s): INR, PROTIME in the last 168 hours. Cardiac Enzymes: No results for input(s): CKTOTAL, CKMB, CKMBINDEX, TROPONINI in the last 168 hours. BNP (last 3 results) No results for input(s): PROBNP in the last 8760 hours. HbA1C: No results for input(s): HGBA1C in the last 72 hours. CBG: Recent Labs  Lab 04/22/17 1237 04/22/17 1546 04/22/17 1951 04/22/17 2349 04/23/17 0446  GLUCAP 85 92 87 94 89   Lipid Profile: No results for input(s): CHOL, HDL, LDLCALC, TRIG, CHOLHDL, LDLDIRECT in the last 72 hours. Thyroid Function Tests: No results for input(s): TSH, T4TOTAL, FREET4, T3FREE, THYROIDAB in the last 72 hours. Anemia Panel: No results for input(s): VITAMINB12, FOLATE, FERRITIN, TIBC, IRON, RETICCTPCT in the last 72 hours. Sepsis Labs: No results for input(s): PROCALCITON, LATICACIDVEN in the last 168 hours.  No results found for this or any previous visit (from the past 240 hour(s)).   Radiology Studies: No results found. Scheduled Meds: . darbepoetin (ARANESP) injection - DIALYSIS  200 mcg Intravenous Q Mon-HD  . feeding supplement (ENSURE ENLIVE)  237 mL Oral BID BM  . folic acid  1 mg Oral QHS  . heparin subcutaneous  5,000 Units Subcutaneous Q8H  . insulin aspart  0-9 Units Subcutaneous Q4H  . mouth rinse  15 mL Mouth Rinse BID  . pantoprazole  40 mg Oral QHS  . potassium chloride  40 mEq Oral BID  . sodium  chloride flush  10-40 mL Intracatheter Q12H  . thiamine  100 mg Oral QHS  . traZODone  50 mg Oral QHS   Continuous Infusions: . sodium chloride    . sodium chloride 10 mL/hr at 04/19/17 1900  . sodium chloride    . sodium chloride    . dexmedetomidine (PRECEDEX) IV infusion Stopped (04/17/17 1700)  . feeding supplement (VITAL AF 1.2 CAL) Stopped (04/17/17 0905)  . piperacillin-tazobactam (ZOSYN)  IV Stopped (04/23/17 0504)     LOS: 12 days   Kerney Elbe, DO [facility] Pager [phone]  If 7PM-7AM, please contact night-coverage [email or web address] Password [email or web address] 04/23/2017, 7:30 AM

## 2017-04-23 NOTE — Progress Notes (Signed)
PT Cancellation Note  Patient Details Name: Nolberto Cheuvront MRN: [id number] DOB: 29-Nov-1960   Cancelled Treatment:    Reason Eval/Treat Not Completed: Patient at procedure or test/unavailable. Pt off floor. Will follow-up for PT treatment as time allows.  Mabeline Caras, PT, DPT Acute Rehab Services  Pager: [phone] 04/23/2017, 3:39 PM

## 2017-04-23 NOTE — Procedures (Signed)
PROCEDURE SUMMARY:  Successful US guided right thoracentesis. Yielded 1.3 liters of clear yellow fluid. Pt tolerated procedure well. No immediate complications.  Specimen was sent for labs.  Post procedure chest X-ray reveals no pneumothorax  WENDY S BLAIR PA-C 04/23/2017 4:39 PM

## 2017-04-23 NOTE — Progress Notes (Signed)
CRITICAL VALUE ALERT  Critical Value:  HGB 6.9  Date & Time Notied:  04/23/17 0415  Provider Notified: Dr. Myna Hidalgo, text page  Orders Received/Actions taken: awaiting orders

## 2017-04-23 NOTE — Progress Notes (Signed)
[facility] Progress Note     Subjective: CC:  Reports he is getting thoracentesis today. Also getting some blood for hgb 6.9. Abdominal pain controlled. Tolerating PO but reports poor PO intake 2/2 early satiety and dislike of food. Working with therapies.   Objective: Vital signs in last 24 hours: Temp:  [98 F (36.7 C)-98.7 F (37.1 C)] 98 F (36.7 C) (11/08 1046) Pulse Rate:  [96-117] 110 (11/08 1015) Resp:  [15-25] 20 (11/08 1015) BP: (127-148)/(81-100) 137/91 (11/08 1046) SpO2:  [93 %-100 %] 97 % (11/08 1015) Weight:  [64.4 kg (141 lb 15.6 oz)] 64.4 kg (141 lb 15.6 oz) (11/08 0430) Last BM Date: 04/23/17  Intake/Output from previous day: 11/07 0701 - 11/08 0700 In: 1860 [P.O.:880; I.V.:830; IV Piggyback:150] Out: 1525 [Urine:900; Drains:75; Stool:450] Intake/Output this shift: Total I/O In: 250 [P.O.:240; I.V.:10] Out: -   PE: Gen:  Alert, NAD, pleasant Eyes: pupils equal and round, EOMs intact Card:  sinus tachyardia no LE edema.  Pulm:  Normal effort, some ronchi bilaterally Abd: firm, mild global tenderness, no peritonitis, +BS, RLQ drain in place with small amount feculent drainage.  Skin: warm and dry, no rashes  Psych: A&Ox3    Lab Results:  Recent Labs    04/22/17 1515 04/23/17 0307  WBC 15.1* 16.6*  HGB 7.5* 6.9*  HCT 21.9* 20.0*  PLT 313 304   BMET Recent Labs    04/22/17 1515 04/23/17 0307  NA 131* 130*  K 3.4* 3.5  CL 96* 95*  CO2 26 25  GLUCOSE 94 95  BUN 22* 22*  CREATININE 3.31* 3.42*  CALCIUM 7.7* 7.6*   PT/INR No results for input(s): LABPROT, INR in the last 72 hours. CMP     Component Value Date/Time   NA 130 (L) 04/23/2017 0307   K 3.5 04/23/2017 0307   CL 95 (L) 04/23/2017 0307   CO2 25 04/23/2017 0307   GLUCOSE 95 04/23/2017 0307   BUN 22 (H) 04/23/2017 0307   CREATININE 3.42 (H) 04/23/2017 0307   CALCIUM 7.6 (L) 04/23/2017 0307   PROT 5.5 (L) 04/23/2017 0307   ALBUMIN 1.7 (L) 04/23/2017 0307   AST  17 04/23/2017 0307   ALT 10 (L) 04/23/2017 0307   ALKPHOS 58 04/23/2017 0307   BILITOT 0.7 04/23/2017 0307   GFRNONAA 19 (L) 04/23/2017 0307   GFRAA 22 (L) 04/23/2017 0307   Lipase  No results found for: LIPASE     Studies/Results: Dg Chest Port 1 View  Result Date: 04/23/2017 CLINICAL DATA:  Shortness of breath, ARDS, COPD, healthcare associated pneumonia, current smoker. EXAM: PORTABLE CHEST 1 VIEW COMPARISON:  Chest x-ray of April 21, 2017 FINDINGS: The left lung is well-expanded. The interstitial markings remain increased diffusely. On the right there remains a moderate-sized pleural effusion. Ended it has decreased slightly in size. The interstitial markings remain increased and the hemidiaphragm obscured. The heart is normal in size. The pulmonary vascularity remains engorged. The dual-lumen dialysis catheter tip projects over the midportion of the SVC. IMPRESSION: Slight interval decrease in the volume of pleural fluid on the right. Persistent pulmonary interstitial edema. Electronically Signed   By: David  Martinique M.D.   On: 04/23/2017 08:52    Anti-infectives: Anti-infectives (From admission, onward)   Start     Dose/Rate Route Frequency Ordered Stop   04/23/17 1500  piperacillin-tazobactam (ZOSYN) IVPB 3.375 g     3.375 g 12.5 mL/hr over 240 Minutes Intravenous Every 12 hours 04/23/17 1107  04/18/17 1800  piperacillin-tazobactam (ZOSYN) IVPB 3.375 g  Status:  Discontinued     3.375 g 12.5 mL/hr over 240 Minutes Intravenous Every 12 hours 04/18/17 1032 04/23/17 1107   04/18/17 1400  piperacillin-tazobactam (ZOSYN) IVPB 3.375 g  Status:  Discontinued     3.375 g 12.5 mL/hr over 240 Minutes Intravenous Every 8 hours 04/18/17 1031 04/18/17 1032   04/17/17 1200  piperacillin-tazobactam (ZOSYN) IVPB 3.375 g  Status:  Discontinued     3.375 g 100 mL/hr over 30 Minutes Intravenous Every 6 hours 04/17/17 1030 04/18/17 1031   04/17/17 1100  piperacillin-tazobactam (ZOSYN) IVPB  3.375 g  Status:  Discontinued     3.375 g 12.5 mL/hr over 240 Minutes Intravenous Every 8 hours 04/17/17 1019 04/17/17 1030   04/15/17 2125  Ampicillin-Sulbactam (UNASYN) 3 g in sodium chloride 0.9 % 100 mL IVPB  Status:  Discontinued     3 g 200 mL/hr over 30 Minutes Intravenous Every 8 hours 04/15/17 1529 04/17/17 1019   04/15/17 1100  ampicillin-sulbactam (UNASYN) 1.5 g in sodium chloride 0.9 % 50 mL IVPB  Status:  Discontinued     1.5 g 100 mL/hr over 30 Minutes Intravenous Every 12 hours 04/15/17 0937 04/15/17 1529   04/13/17 2200  levofloxacin (LEVAQUIN) IVPB 500 mg  Status:  Discontinued     500 mg 100 mL/hr over 60 Minutes Intravenous Every 48 hours 04/12/17 0114 04/12/17 0846   04/13/17 1800  piperacillin-tazobactam (ZOSYN) IVPB 2.25 g  Status:  Discontinued     2.25 g 100 mL/hr over 30 Minutes Intravenous Every 8 hours 04/13/17 1050 04/15/17 0937   04/12/17 0200  piperacillin-tazobactam (ZOSYN) IVPB 3.375 g  Status:  Discontinued     3.375 g 12.5 mL/hr over 240 Minutes Intravenous Every 8 hours 04/12/17 0103 04/13/17 1050   04/12/17 0000  levofloxacin (LEVAQUIN) IVPB 750 mg  Status:  Discontinued     750 mg 100 mL/hr over 90 Minutes Intravenous Every 48 hours 04/11/17 2230 04/12/17 0114       Assessment/Plan Diverticulitis with perforation s/p IR drain - Repeat CT 04/19/17: no significant residual abscess around drain, 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 Stable small bowel dilation. - Afebrile, WBC trending down (16.6 today) - Drain 75 cc/24h, continue drain - Having bowel function (watery stool in rectal tube) and pain improving - Continue IV abx - continue therapies  - Long term plan would be to control leak and keep on abx. Hopefully have definitive surgery in 6-8 weeks. Will need to increase PO intake/protein intake. Checking pre-albumin today.   FEN: HH/carb mod ID: Unasyn 10/31-11/2, Zosyn 10/28 >> (day #9) VTE: SCD's, SQ heparin     LOS: 12 days    Jill Alexanders ,  [facility] 04/23/2017, 11:15 AM Pager: [phone] Consults: [phone] Mon-Fri 7:00 am-4:30 pm Sat-Sun 7:00 am-11:30 am

## 2017-04-23 NOTE — Progress Notes (Signed)
[facility] ROUNDING NOTE   Subjective:   Interval History: Patient continue to do well this morning. No acute complaints. Has been working with PT/OT and tolerating therapy well. Breathing is improving. Patient is ambulating.  Objective:  Vital signs in last 24 hours:  Temp:  [98 F (36.7 C)-98.9 F (37.2 C)] 98.9 F (37.2 C) (11/08 1200) Pulse Rate:  [96-117] 110 (11/08 1015) Resp:  [15-25] 20 (11/08 1015) BP: (127-148)/(81-100) 137/91 (11/08 1046) SpO2:  [93 %-100 %] 97 % (11/08 1015) Weight:  [141 lb 15.6 oz (64.4 kg)] 141 lb 15.6 oz (64.4 kg) (11/08 0430)  Weight change: -3 oz (-1.9 kg) Filed Weights   04/21/17 0800 04/22/17 0432 04/23/17 0430  Weight: 146 lb 2.6 oz (66.3 kg) 143 lb 11.8 oz (65.2 kg) 141 lb 15.6 oz (64.4 kg)    Intake/Output: I/O last 3 completed shifts: In: 1860 [P.O.:880; I.V.:830; IV Piggyback:150] Out: 2025 [Urine:1200; Drains:75; Other:100; Stool:650]    Intake/Output Summary (Last 24 hours) at 04/23/2017 1218 Last data filed at 04/23/2017 1216 Gross per 24 hour  Intake 1870 ml  Output 1925 ml  Net -55 ml      Intake/Output this shift:  Total I/O In: 250 [P.O.:240; I.V.:10] Out: 400 [Urine:400]  Gen: no acute distress, in bed answering questions appropriatley and able to participate in exam. HEENT: R nontunneled HD catheter in place CVS: RRR, no murmur appreciated  Resp: breath sounds coarse rhonchi but improved WUJ:[id number], hypoactive bowel sounds Ext: no peripheral edema, some dependent sacral edema    Basic Metabolic Panel: Recent Labs  Lab 04/17/17 1608 04/17/17 2339 04/18/17 0440 04/19/17 0336 04/20/17 0444 04/21/17 1133 04/22/17 1515 04/23/17 0307  NA 137 133* 135 131* 128* 131* 131* 130*  K 4.2 3.7 3.7 3.3* 2.9* 3.3* 3.4* 3.5  CL 102 99* 99* 96* 93* 95* 96* 95*  CO2 27 25 25 26 23 25 26 25   GLUCOSE 102* 99 100* 99 65 109* 94 95  BUN 16 20 20 12 19  21* 22* 22*  CREATININE 1.84* 2.40* 2.64* 1.99*  2.79* 3.24* 3.31* 3.42*  CALCIUM 7.9* 7.4* 7.6* 7.4* 7.3* 7.7* 7.7* 7.6*  MG  --  2.0 1.9 1.8  --   --  1.6* 1.6*  PHOS 2.8  --  3.9 4.1  --   --  4.4 4.4    Liver Function Tests: Recent Labs  Lab 04/16/17 1505 04/17/17 0414 04/17/17 1608 04/22/17 1515 04/23/17 0307  AST  --   --   --  19 17  ALT  --   --   --  9* 10*  ALKPHOS  --   --   --  67 58  BILITOT  --   --   --  0.7 0.7  PROT  --   --   --  6.0* 5.5*  ALBUMIN 1.4* 1.5* 1.7* 1.8* 1.7*   No results for input(s): LIPASE, AMYLASE in the last 168 hours. No results for input(s): AMMONIA in the last 168 hours.  CBC: Recent Labs  Lab 04/19/17 0336 04/20/17 0444 04/21/17 1133 04/22/17 1515 04/23/17 0307  WBC 25.1* 21.2* 17.4* 15.1* 16.6*  NEUTROABS  --   --   --  12.1* 13.2*  HGB 7.0* 7.3* 7.7* 7.5* 6.9*  HCT 21.0* 21.8* 23.3* 21.9* 20.0*  MCV 93.3 93.2 94.0 94.0 93.9  PLT 241 254 284 313 304    Cardiac Enzymes: No results for input(s): CKTOTAL, CKMB, CKMBINDEX, TROPONINI in the last 168 hours.  BNP:  Invalid input(s): POCBNP  CBG: Recent Labs  Lab 04/22/17 1951 04/22/17 2349 04/23/17 0446 04/23/17 0833 04/23/17 1211  GLUCAP 87 94 89 86 89    Microbiology: Results for orders placed or performed during the hospital encounter of 04/11/17  MRSA PCR Screening     Status: None   Collection Time: 04/11/17  3:54 PM  Result Value Ref Range Status   MRSA by PCR NEGATIVE NEGATIVE Final    Comment:        The GeneXpert MRSA Assay (FDA approved for NASAL specimens only), is one component of a comprehensive MRSA colonization surveillance program. It is not intended to diagnose MRSA infection nor to guide or monitor treatment for MRSA infections.     Coagulation Studies: No results for input(s): LABPROT, INR in the last 72 hours.  Urinalysis: No results for input(s): COLORURINE, LABSPEC, PHURINE, GLUCOSEU, HGBUR, BILIRUBINUR, KETONESUR, PROTEINUR, UROBILINOGEN, NITRITE, LEUKOCYTESUR in the last 72  hours.  Invalid input(s): APPERANCEUR    Imaging: Dg Chest Port 1 View  Result Date: 04/23/2017 CLINICAL DATA:  Shortness of breath, ARDS, COPD, healthcare associated pneumonia, current smoker. EXAM: PORTABLE CHEST 1 VIEW COMPARISON:  Chest x-ray of April 21, 2017 FINDINGS: The left lung is well-expanded. The interstitial markings remain increased diffusely. On the right there remains a moderate-sized pleural effusion. Ended it has decreased slightly in size. The interstitial markings remain increased and the hemidiaphragm obscured. The heart is normal in size. The pulmonary vascularity remains engorged. The dual-lumen dialysis catheter tip projects over the midportion of the SVC. IMPRESSION: Slight interval decrease in the volume of pleural fluid on the right. Persistent pulmonary interstitial edema. Electronically Signed   By: David  Martinique M.D.   On: 04/23/2017 08:52     Medications:   . sodium chloride    . sodium chloride 10 mL/hr at 04/19/17 1900  . sodium chloride    . sodium chloride    . dexmedetomidine (PRECEDEX) IV infusion Stopped (04/17/17 1700)  . feeding supplement (VITAL AF 1.2 CAL) Stopped (04/17/17 0905)  . furosemide    . piperacillin-tazobactam (ZOSYN)  IV     . darbepoetin (ARANESP) injection - DIALYSIS  200 mcg Intravenous Q Mon-HD  . feeding supplement (ENSURE ENLIVE)  237 mL Oral BID BM  . folic acid  1 mg Oral QHS  . heparin subcutaneous  5,000 Units Subcutaneous Q8H  . insulin aspart  0-9 Units Subcutaneous Q4H  . mouth rinse  15 mL Mouth Rinse BID  . pantoprazole  40 mg Oral QHS  . sodium chloride flush  10-40 mL Intracatheter Q12H  . thiamine  100 mg Oral QHS  . traZODone  50 mg Oral QHS   sodium chloride, sodium chloride, sodium chloride, alteplase, fentaNYL (SUBLIMAZE) injection, heparin, heparin, heparin, ipratropium-albuterol, lidocaine (PF), lidocaine-prilocaine, metoprolol tartrate, ondansetron (ZOFRAN) IV, pentafluoroprop-tetrafluoroeth, sodium  chloride flush  Assessment/ Plan:   AKI (unknown b/l) - Baseline Cr unknown.  Initially on CRRT,  tolerated IHD. Creatinine this morning is 3.42 from 3.31 and appears to have reach a plateau . Patient has been off of lasix with 900 ml UOP in the past 24 hours. Given worsening pleural effusion on 11/6 which has slightly improved on CXR this am, patient could benefit from a one time dose of Lasix. Will continue to monitor kidney function for recovery still no indication for dialysis.  --Start lasix 120 mg IV once. --Follow up on am BMP  Pleural effusion, stable   Patient has a significant smoking history 150 pack  year history. Lung         exam is consistent with rhonchi throughout both lung field. Concern           for possible malignancy given history.   --Follow up on diagnostic thoracentesis.       Hypokalemia, resolving  replete as needed.  Anemia of chronic disease - initiated po iron, Aranesp 200 mcg q Monday VDRF - s/p extubation, resp status improving Diverticular abscess s/p drain placed by IR, on Zosyn, repeat imaging per primary. Seen by surgery with recommendations for medical management and possible surgery in 6-8 weeks. Hypophosphatemia, Resolved    LOS: [place], MD Keith, PGY-2  @TODAY @12 :18 PM

## 2017-04-23 NOTE — Care Management Note (Signed)
Case Management Note  Patient Details  Name: Dominic Phillips MRN: [id number] Date of Birth: 09/17/1960  Subjective/Objective:    Pt admitted with AKI and ARDS       Action/Plan:  PTA from home.  Pt extubated 04/17/17 but still requiring CRRT.  CSW consulted for substance abuse and potential placement.  CM will continue to follow for discharge needs   Expected Discharge Date:                  Expected Discharge Plan:  [place]  In-House Referral:  Clinical Social Work  Discharge planning Services  CM Consult  Post Acute Care Choice:    Choice offered to:     DME Arranged:    DME Agency:     HH Arranged:    [place] Agency:     Status of Service:     If discussed at [employer], dates discussed:    Additional Comments: 04/23/2017  Discussed in LOS 04/23/17 - pt remains appropriate for continued stay.  Pt has increasing pleural effusion and will require a thoracentesis, remains on IV zosyn.  Discharge plan continues to be SNF - CSW actively working placement   04/21/17 Discussed in LOS 11/6 - remains appropriate for continued stay- JP drain still in place, WBC trending up, IV antibiotics.  Pt is also being followed by renal - pt has received 1 IHD treatment with daily assessments for additional HD sessions.  Maryclare Labrador, RN 04/23/2017, 8:59 AM

## 2017-04-23 NOTE — Progress Notes (Signed)
Inpatient Rehabilitation  Attempted to meet with patient today; however, he was resting and I did not wake him.  Note plans for thoracentesis and ongoing HD needs/determiniation of acute versus chronic.  Will continue to follow for timing of medical readiness and therapy tolerance.  Call if questions.   Carmelia Roller., CCC/SLP Admission Coordinator  [place]  Cell [phone]

## 2017-04-24 ENCOUNTER — Inpatient Hospital Stay (HOSPITAL_COMMUNITY): Payer: Medicaid Other

## 2017-04-24 DIAGNOSIS — T17908D Unspecified foreign body in respiratory tract, part unspecified causing other injury, subsequent encounter: Secondary | ICD-10-CM

## 2017-04-24 LAB — AMMONIA: AMMONIA: 54 umol/L — AB (ref 9–35)

## 2017-04-24 LAB — CBC WITH DIFFERENTIAL/PLATELET
BASOS PCT: 1 %
Basophils Absolute: 0.2 10*3/uL — ABNORMAL HIGH (ref 0.0–0.1)
EOS PCT: 2 %
Eosinophils Absolute: 0.4 10*3/uL (ref 0.0–0.7)
HEMATOCRIT: 25.5 % — AB (ref 39.0–52.0)
HEMOGLOBIN: 8.7 g/dL — AB (ref 13.0–17.0)
LYMPHS ABS: 1.9 10*3/uL (ref 0.7–4.0)
Lymphocytes Relative: 9 %
MCH: 31.1 pg (ref 26.0–34.0)
MCHC: 34.1 g/dL (ref 30.0–36.0)
MCV: 91.1 fL (ref 78.0–100.0)
MONO ABS: 1.9 10*3/uL — AB (ref 0.1–1.0)
MONOS PCT: 9 %
NEUTROS ABS: 16.4 10*3/uL — AB (ref 1.7–7.7)
Neutrophils Relative %: 79 %
Platelets: 345 10*3/uL (ref 150–400)
RBC: 2.8 MIL/uL — ABNORMAL LOW (ref 4.22–5.81)
RDW: 18.2 % — ABNORMAL HIGH (ref 11.5–15.5)
WBC: 20.8 10*3/uL — ABNORMAL HIGH (ref 4.0–10.5)

## 2017-04-24 LAB — BLOOD GAS, ARTERIAL
ACID-BASE EXCESS: 0.7 mmol/L (ref 0.0–2.0)
Acid-Base Excess: 0.2 mmol/L (ref 0.0–2.0)
Bicarbonate: 25.3 mmol/L (ref 20.0–28.0)
Bicarbonate: 25.3 mmol/L (ref 20.0–28.0)
DRAWN BY: 511851
DRAWN BY: 274071
O2 CONTENT: 8 L/min
O2 Content: 15 L/min
O2 Saturation: 89.8 %
O2 Saturation: 96.5 %
PATIENT TEMPERATURE: 98.1
PH ART: 7.375 (ref 7.350–7.450)
PO2 ART: 59.7 mmHg — AB (ref 83.0–108.0)
Patient temperature: 98.6
pCO2 arterial: 47.2 mmHg (ref 32.0–48.0)
pCO2 arterial: 44.3 mmHg (ref 32.0–48.0)
pH, Arterial: 7.346 — ABNORMAL LOW (ref 7.350–7.450)
pO2, Arterial: 86.2 mmHg (ref 83.0–108.0)

## 2017-04-24 LAB — COMPREHENSIVE METABOLIC PANEL
ALK PHOS: 65 U/L (ref 38–126)
ALT: 9 U/L — ABNORMAL LOW (ref 17–63)
ANION GAP: 10 (ref 5–15)
AST: 18 U/L (ref 15–41)
Albumin: 1.7 g/dL — ABNORMAL LOW (ref 3.5–5.0)
BILIRUBIN TOTAL: 0.4 mg/dL (ref 0.3–1.2)
BUN: 26 mg/dL — ABNORMAL HIGH (ref 6–20)
CALCIUM: 7.7 mg/dL — AB (ref 8.9–10.3)
CO2: 26 mmol/L (ref 22–32)
Chloride: 95 mmol/L — ABNORMAL LOW (ref 101–111)
Creatinine, Ser: 3.15 mg/dL — ABNORMAL HIGH (ref 0.61–1.24)
GFR calc Af Amer: 24 mL/min — ABNORMAL LOW (ref >60)
GFR, EST NON AFRICAN AMERICAN: 21 mL/min — AB (ref >60)
GLUCOSE: 87 mg/dL (ref 65–99)
Potassium: 3.5 mmol/L (ref 3.5–5.1)
Sodium: 131 mmol/L — ABNORMAL LOW (ref 135–145)
TOTAL PROTEIN: 5.7 g/dL — AB (ref 6.5–8.1)

## 2017-04-24 LAB — GLUCOSE, CAPILLARY
GLUCOSE-CAPILLARY: 101 mg/dL — AB (ref 65–99)
GLUCOSE-CAPILLARY: 89 mg/dL (ref 65–99)
GLUCOSE-CAPILLARY: 133 mg/dL — AB (ref 65–99)
Glucose-Capillary: 101 mg/dL — ABNORMAL HIGH (ref 65–99)
Glucose-Capillary: 132 mg/dL — ABNORMAL HIGH (ref 65–99)
Glucose-Capillary: 96 mg/dL (ref 65–99)

## 2017-04-24 LAB — BPAM RBC
BLOOD PRODUCT EXPIRATION DATE: 201811142359
ISSUE DATE / TIME: 201811081008
Unit Type and Rh: 7300

## 2017-04-24 LAB — TYPE AND SCREEN
ABO/RH(D): B POS
ANTIBODY SCREEN: NEGATIVE
Unit division: 0

## 2017-04-24 LAB — MAGNESIUM: MAGNESIUM: 1.8 mg/dL (ref 1.7–2.4)

## 2017-04-24 LAB — PHOSPHORUS: Phosphorus: 4.2 mg/dL (ref 2.5–4.6)

## 2017-04-24 MED ORDER — ENSURE ENLIVE PO LIQD
237.0000 mL | Freq: Four times a day (QID) | ORAL | Status: DC
  Administered 2017-04-24 – 2017-05-12 (×54): 237 mL via ORAL

## 2017-04-24 MED ORDER — VANCOMYCIN HCL IN DEXTROSE 1-5 GM/200ML-% IV SOLN
1000.0000 mg | INTRAVENOUS | Status: DC
  Administered 2017-04-24: 1000 mg via INTRAVENOUS
  Filled 2017-04-24 (×2): qty 200

## 2017-04-24 MED ORDER — ADULT MULTIVITAMIN W/MINERALS CH
1.0000 | ORAL_TABLET | Freq: Every day | ORAL | Status: DC
  Administered 2017-04-24 – 2017-05-12 (×18): 1 via ORAL
  Filled 2017-04-24 (×18): qty 1

## 2017-04-24 MED ORDER — ENSURE ENLIVE PO LIQD
237.0000 mL | Freq: Three times a day (TID) | ORAL | Status: DC
  Administered 2017-04-24 (×2): 237 mL via ORAL

## 2017-04-24 MED ORDER — VANCOMYCIN HCL IN DEXTROSE 1-5 GM/200ML-% IV SOLN
1000.0000 mg | Freq: Once | INTRAVENOUS | Status: DC
  Filled 2017-04-24: qty 200

## 2017-04-24 NOTE — Progress Notes (Signed)
Pharmacy Antibiotic Note Dominic Phillips is a 56 y.o. male admitted on 04/11/2017 with diverticular abscess and AKI. Pt required CRRT and IHD during this admission with last IHD session on 11/3. Patient has been maintained on Zosyn but with worsening leukocytosis and concern for pneumonia pharmacy asked to add vancomycin. SCr 3.1 with no acute indications for IHD.   Plan: 1. Vancomycin 1 gram IV every 48 hours 2. Continue Zosyn 3.375 gram IV every 12 hours (infused over 4 hours) 3. Follow up renal function; if AKI improves will need to empirically adjust Zosyn and vancomycin doses   Height: 5\' 11"  (180.3 cm) Weight: 147 lb 14.9 oz (67.1 kg) IBW/kg (Calculated) : 75.3  Temp (24hrs), Avg:98.3 F (36.8 C), Min:98.1 F (36.7 C), Max:98.6 F (37 C)  Recent Labs  Lab 04/20/17 0444 04/21/17 1133 04/22/17 1515 04/23/17 0307 04/24/17 0325  WBC 21.2* 17.4* 15.1* 16.6* 20.8*  CREATININE 2.79* 3.24* 3.31* 3.42* 3.15*    Estimated Creatinine Clearance: 25.4 mL/min (A) (by C-G formula based on SCr of 3.15 mg/dL (H)).    No Known Allergies  Antimicrobials this admission:  Zosyn 10/28 >> 10/31; 11/2 >>  Vancomycin 11/9 >>  Unasyn 10/31 >>11/2   Microbiology results: 11/8 pleural fluid: ngtd 10/27 MRSA PCR - negative  10/19 OSH: Intra-op culture: Ecoli (pan sensitive), Finegoldia magna (formerly peptostreptococcus), Staph saccharolyticus, and Prevotella loescheii  Thank you for allowing pharmacy to be a part of this patient's care.  Vincenza Hews, PharmD, BCPS 04/24/2017, 5:02 PM

## 2017-04-24 NOTE — Progress Notes (Signed)
[facility] Progress Note     Subjective: CC:  No acute events or new complaints. Reports coughing is easier s/p thoracentesis. Reports decreased work of breathing. Abdominal pain controlled. Working with therpaies. Poor PO intake yesterday. Discussed increasing ensure to TID between meals and eating 5-6 small meals a day, as he cannot tolerated 3 larger meals 2/2 nausea.  Objective: Vital signs in last 24 hours: Temp:  [97.8 F (36.6 C)-98.9 F (37.2 C)] 98.6 F (37 C) (11/09 0400) Pulse Rate:  [100-133] 112 (11/09 0400) Resp:  [20-34] 23 (11/09 0400) BP: (129-151)/(89-100) 137/89 (11/09 0400) SpO2:  [82 %-99 %] 92 % (11/09 0400) Weight:  [67.1 kg (147 lb 14.9 oz)] 67.1 kg (147 lb 14.9 oz) (11/09 0416) Last BM Date: 04/24/17(Simultaneous filing. User may not have seen previous data.)  Intake/Output from previous day: 11/08 0701 - 11/09 0700 In: 1604 [P.O.:1139; I.V.:100; Blood:315; IV Piggyback:50] Out: 1927 [Urine:1825; Drains:2; Stool:100] Intake/Output this shift: No intake/output data recorded.  PE: Gen: Alert, NAD, pleasant Eyes: pupils equal and round, EOMs intact Card: sinus tachyardia no LE edema. Pulm: Normal effort, CTAB WJX:BJYN, mild global tenderness, no peritonitis, +BS, RLQ drain in place with small amount feculent drainage. Skin: warm and dry, no rashes  Psych: A&Ox3     Lab Results:  Recent Labs    04/23/17 0307 04/24/17 0325  WBC 16.6* 20.8*  HGB 6.9* 8.7*  HCT 20.0* 25.5*  PLT 304 345   BMET Recent Labs    04/23/17 0307 04/24/17 0325  NA 130* 131*  K 3.5 3.5  CL 95* 95*  CO2 25 26  GLUCOSE 95 87  BUN 22* 26*  CREATININE 3.42* 3.15*  CALCIUM 7.6* 7.7*   PT/INR No results for input(s): LABPROT, INR in the last 72 hours. CMP     Component Value Date/Time   NA 131 (L) 04/24/2017 0325   K 3.5 04/24/2017 0325   CL 95 (L) 04/24/2017 0325   CO2 26 04/24/2017 0325   GLUCOSE 87 04/24/2017 0325   BUN 26 (H)  04/24/2017 0325   CREATININE 3.15 (H) 04/24/2017 0325   CALCIUM 7.7 (L) 04/24/2017 0325   PROT 5.7 (L) 04/24/2017 0325   ALBUMIN 1.7 (L) 04/24/2017 0325   AST 18 04/24/2017 0325   ALT 9 (L) 04/24/2017 0325   ALKPHOS 65 04/24/2017 0325   BILITOT 0.4 04/24/2017 0325   GFRNONAA 21 (L) 04/24/2017 0325   GFRAA 24 (L) 04/24/2017 0325   Lipase  No results found for: LIPASE     Studies/Results: Dg Chest 1 View  Result Date: 04/23/2017 CLINICAL DATA:  Status post right thoracentesis. EXAM: CHEST 1 VIEW COMPARISON:  Portable chest x-ray of earlier today. FINDINGS: The volume of pleural fluid on the right has decreased. No postprocedure pneumothorax is observed. There is persistent right basilar atelectasis as well as small amount of pleural fluid. The interstitial markings of both lungs remain increased. The heart is normal in size. The pulmonary vascularity is not engorged. The dual-lumen right internal jugular venous catheter tip projects over the midportion of the SVC. IMPRESSION: Successful right sided thoracentesis. No more than a small amount of pleural fluid remains. No postprocedure pneumothorax. Electronically Signed   By: David  Martinique M.D.   On: 04/23/2017 16:10   Dg Chest Port 1 View  Result Date: 04/24/2017 CLINICAL DATA:  Aspiration EXAM: PORTABLE CHEST 1 VIEW COMPARISON:  04/23/2017 FINDINGS: Right-sided central venous catheter tip overlies the distal SVC. Small bilateral effusions. Stable cardiomediastinal silhouette. Interval  worsening of diffuse interstitial and alveolar opacity. No pneumothorax. IMPRESSION: 1. Small bilateral effusions 2. Interval worsening of diffuse interstitial and alveolar opacity consistent with worsening edema or diffuse pneumonia Electronically Signed   By: Donavan Foil M.D.   On: 04/24/2017 02:02   Dg Chest Port 1 View  Result Date: 04/23/2017 CLINICAL DATA:  Shortness of breath, ARDS, COPD, healthcare associated pneumonia, current smoker. EXAM:  PORTABLE CHEST 1 VIEW COMPARISON:  Chest x-ray of April 21, 2017 FINDINGS: The left lung is well-expanded. The interstitial markings remain increased diffusely. On the right there remains a moderate-sized pleural effusion. Ended it has decreased slightly in size. The interstitial markings remain increased and the hemidiaphragm obscured. The heart is normal in size. The pulmonary vascularity remains engorged. The dual-lumen dialysis catheter tip projects over the midportion of the SVC. IMPRESSION: Slight interval decrease in the volume of pleural fluid on the right. Persistent pulmonary interstitial edema. Electronically Signed   By: David  Martinique M.D.   On: 04/23/2017 08:52   Ir Thoracentesis Asp Pleural Space W/img Guide  Result Date: 04/23/2017 INDICATION: Shortness of breath, ARDS, COPD, healthcare associated pneumonia, current smoker, pleural effusion. Request for diagnostic and therapeutic thoracentesis. EXAM: ULTRASOUND GUIDED RIGHT THORACENTESIS MEDICATIONS: 2% Lidocaine = 10 mL COMPLICATIONS: None immediate. PROCEDURE: An ultrasound guided thoracentesis was thoroughly discussed with the patient and questions answered. The benefits, risks, alternatives and complications were also discussed. The patient understands and wishes to proceed with the procedure. Written consent was obtained. Ultrasound was performed to localize and mark an adequate pocket of fluid in the right chest. The area was then prepped and draped in the normal sterile fashion. 1% Lidocaine was used for local anesthesia. Under ultrasound guidance a 6 Fr Safe-T-Centesis catheter was introduced. Thoracentesis was performed. The catheter was removed and a dressing applied. FINDINGS: A total of approximately 1.3 liters of clear yellow fluid was removed. Samples were sent to the laboratory as requested by the clinical team. IMPRESSION: Successful ultrasound guided right thoracentesis yielding 1.3 liters of pleural fluid. No pneumothorax on  post procedure chest X-ray. Read by:  Gareth Eagle, PA-C Electronically Signed   By: Corrie Mckusick D.O.   On: 04/23/2017 16:37    Anti-infectives: Anti-infectives (From admission, onward)   Start     Dose/Rate Route Frequency Ordered Stop   04/23/17 1500  piperacillin-tazobactam (ZOSYN) IVPB 3.375 g     3.375 g 12.5 mL/hr over 240 Minutes Intravenous Every 12 hours 04/23/17 1107     04/18/17 1800  piperacillin-tazobactam (ZOSYN) IVPB 3.375 g  Status:  Discontinued     3.375 g 12.5 mL/hr over 240 Minutes Intravenous Every 12 hours 04/18/17 1032 04/23/17 1107   04/18/17 1400  piperacillin-tazobactam (ZOSYN) IVPB 3.375 g  Status:  Discontinued     3.375 g 12.5 mL/hr over 240 Minutes Intravenous Every 8 hours 04/18/17 1031 04/18/17 1032   04/17/17 1200  piperacillin-tazobactam (ZOSYN) IVPB 3.375 g  Status:  Discontinued     3.375 g 100 mL/hr over 30 Minutes Intravenous Every 6 hours 04/17/17 1030 04/18/17 1031   04/17/17 1100  piperacillin-tazobactam (ZOSYN) IVPB 3.375 g  Status:  Discontinued     3.375 g 12.5 mL/hr over 240 Minutes Intravenous Every 8 hours 04/17/17 1019 04/17/17 1030   04/15/17 2125  Ampicillin-Sulbactam (UNASYN) 3 g in sodium chloride 0.9 % 100 mL IVPB  Status:  Discontinued     3 g 200 mL/hr over 30 Minutes Intravenous Every 8 hours 04/15/17 1529 04/17/17 1019  04/15/17 1100  ampicillin-sulbactam (UNASYN) 1.5 g in sodium chloride 0.9 % 50 mL IVPB  Status:  Discontinued     1.5 g 100 mL/hr over 30 Minutes Intravenous Every 12 hours 04/15/17 0937 04/15/17 1529   04/13/17 2200  levofloxacin (LEVAQUIN) IVPB 500 mg  Status:  Discontinued     500 mg 100 mL/hr over 60 Minutes Intravenous Every 48 hours 04/12/17 0114 04/12/17 0846   04/13/17 1800  piperacillin-tazobactam (ZOSYN) IVPB 2.25 g  Status:  Discontinued     2.25 g 100 mL/hr over 30 Minutes Intravenous Every 8 hours 04/13/17 1050 04/15/17 0937   04/12/17 0200  piperacillin-tazobactam (ZOSYN) IVPB 3.375 g  Status:   Discontinued     3.375 g 12.5 mL/hr over 240 Minutes Intravenous Every 8 hours 04/12/17 0103 04/13/17 1050   04/12/17 0000  levofloxacin (LEVAQUIN) IVPB 750 mg  Status:  Discontinued     750 mg 100 mL/hr over 90 Minutes Intravenous Every 48 hours 04/11/17 2230 04/12/17 0114     Assessment/Plan Diverticulitis with perforation s/p IR drain - Repeat CT 04/19/17: no significant residual abscess around drain, 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 Stable small bowel dilation. - Afebrile, WBC has been trending down - slightly up today s/p procedure yesterday. Follow.  - Drain 2 cc/24h, continue drain - Having bowel function (watery stool in rectal tube) and pain improving - Continue IV abx - continue therapies  - Long term plan would be to control leak and keep on abx. Hopefully have definitive surgery in 6-8 weeks. Will need to increase PO intake/protein intake.   FEN: HH/carb mod; prealbumin 7.9 on 11/8 - increase ensure to TID. Encourage 5-6 small meals daily.  ID: Unasyn 10/31-11/2, Zosyn 10/28 >> (day #9) VTE: SCD's, SQ heparin    LOS: 13 days    Jill Alexanders , [facility] 04/24/2017, 7:50 AM Pager: [phone] Consults: [phone] Mon-Fri 7:00 am-4:30 pm Sat-Sun 7:00 am-11:30 am

## 2017-04-24 NOTE — Progress Notes (Signed)
RT called to bedside by RN due to pt SATs in the 80's. Placed pt on (salter) HFNC 8L and pt sats are 90-94%. Pt resting comfortably. RT to cont to monitor and wean pt O2.

## 2017-04-24 NOTE — Progress Notes (Signed)
Patients ABG shows Ph 7.346, P02 59.7.  RN placed patient on non-rebreather Dr. Alfredia Ferguson paged, awaiting response.

## 2017-04-24 NOTE — Progress Notes (Signed)
Patient placed on 15L HFNC respiratory to draw and ABG at 1715.  Will continue to follow up.

## 2017-04-24 NOTE — Progress Notes (Signed)
[facility] ROUNDING NOTE   Subjective:   Interval History: Patient continue to do well this morning. No acute complaints. Requesting line and tubes to be removed as he continue to do well.  Objective:  Vital signs in last 24 hours:  Temp:  [97.8 F (36.6 C)-98.9 F (37.2 C)] 98.1 F (36.7 C) (11/09 0800) Pulse Rate:  [100-133] 114 (11/09 0800) Resp:  [20-34] 21 (11/09 0800) BP: (133-151)/(89-91) 142/89 (11/09 0800) SpO2:  [82 %-99 %] 94 % (11/09 0800) Weight:  [147 lb 14.9 oz (67.1 kg)] 147 lb 14.9 oz (67.1 kg) (11/09 0416)  Weight change: 5 lb 15.2 oz (2.7 kg) Filed Weights   04/22/17 0432 04/23/17 0430 04/24/17 0416  Weight: 143 lb 11.8 oz (65.2 kg) 141 lb 15.6 oz (64.4 kg) 147 lb 14.9 oz (67.1 kg)    Intake/Output: I/O last 3 completed shifts: In: 3224 [P.O.:1779; I.V.:930; Blood:315; IV Piggyback:200] Out: 0623 [Urine:2725; Drains:77; Stool:550]    Intake/Output Summary (Last 24 hours) at 04/24/2017 1139 Last data filed at 04/24/2017 0900 Gross per 24 hour  Intake 1474 ml  Output 2077 ml  Net -603 ml      Intake/Output this shift:  Total I/O In: 120 [P.O.:120] Out: 150 [Urine:150]  Gen: no acute distress, in bed answering questions appropriatley and able to participate in exam. HEENT: R nontunneled HD catheter in place CVS: RRR, no murmur appreciated  Resp: breath sounds coarse rhonchi but improved JSE:[id number], hypoactive bowel sounds Ext: no peripheral edema, some dependent sacral edema    Basic Metabolic Panel: Recent Labs  Lab 04/18/17 0440 04/19/17 0336 04/20/17 0444 04/21/17 1133 04/22/17 1515 04/23/17 0307 04/24/17 0325  NA 135 131* 128* 131* 131* 130* 131*  K 3.7 3.3* 2.9* 3.3* 3.4* 3.5 3.5  CL 99* 96* 93* 95* 96* 95* 95*  CO2 25 26 23 25 26 25 26   GLUCOSE 100* 99 65 109* 94 95 87  BUN 20 12 19  21* 22* 22* 26*  CREATININE 2.64* 1.99* 2.79* 3.24* 3.31* 3.42* 3.15*  CALCIUM 7.6* 7.4* 7.3* 7.7* 7.7* 7.6* 7.7*  MG 1.9 1.8   --   --  1.6* 1.6* 1.8  PHOS 3.9 4.1  --   --  4.4 4.4 4.2    Liver Function Tests: Recent Labs  Lab 04/17/17 1608 04/22/17 1515 04/23/17 0307 04/24/17 0325  AST  --  19 17 18   ALT  --  9* 10* 9*  ALKPHOS  --  67 58 65  BILITOT  --  0.7 0.7 0.4  PROT  --  6.0* 5.5* 5.7*  ALBUMIN 1.7* 1.8* 1.7* 1.7*   No results for input(s): LIPASE, AMYLASE in the last 168 hours. No results for input(s): AMMONIA in the last 168 hours.  CBC: Recent Labs  Lab 04/20/17 0444 04/21/17 1133 04/22/17 1515 04/23/17 0307 04/24/17 0325  WBC 21.2* 17.4* 15.1* 16.6* 20.8*  NEUTROABS  --   --  12.1* 13.2* 16.4*  HGB 7.3* 7.7* 7.5* 6.9* 8.7*  HCT 21.8* 23.3* 21.9* 20.0* 25.5*  MCV 93.2 94.0 94.0 93.9 91.1  PLT 254 284 313 304 345    Cardiac Enzymes: No results for input(s): CKTOTAL, CKMB, CKMBINDEX, TROPONINI in the last 168 hours.  BNP: Invalid input(s): POCBNP  CBG: Recent Labs  Lab 04/23/17 1723 04/23/17 2008 04/24/17 0007 04/24/17 0408 04/24/17 0819  GLUCAP 116* 110* 132* 101* 101*    Microbiology: Results for orders placed or performed during the hospital encounter of 04/11/17  MRSA PCR Screening  Status: None   Collection Time: 04/11/17  3:54 PM  Result Value Ref Range Status   MRSA by PCR NEGATIVE NEGATIVE Final    Comment:        The GeneXpert MRSA Assay (FDA approved for NASAL specimens only), is one component of a comprehensive MRSA colonization surveillance program. It is not intended to diagnose MRSA infection nor to guide or monitor treatment for MRSA infections.   Gram stain     Status: None   Collection Time: 04/23/17  3:54 PM  Result Value Ref Range Status   Specimen Description FLUID PLEURAL  Final   Special Requests NONE  Final   Gram Stain   Final    FEW WBC PRESENT,BOTH PMN AND MONONUCLEAR NO ORGANISMS SEEN    Report Status 04/23/2017 FINAL  Final    Coagulation Studies: No results for input(s): LABPROT, INR in the last 72  hours.  Urinalysis: No results for input(s): COLORURINE, LABSPEC, PHURINE, GLUCOSEU, HGBUR, BILIRUBINUR, KETONESUR, PROTEINUR, UROBILINOGEN, NITRITE, LEUKOCYTESUR in the last 72 hours.  Invalid input(s): APPERANCEUR    Imaging: Dg Chest 1 View  Result Date: 04/23/2017 CLINICAL DATA:  Status post right thoracentesis. EXAM: CHEST 1 VIEW COMPARISON:  Portable chest x-ray of earlier today. FINDINGS: The volume of pleural fluid on the right has decreased. No postprocedure pneumothorax is observed. There is persistent right basilar atelectasis as well as small amount of pleural fluid. The interstitial markings of both lungs remain increased. The heart is normal in size. The pulmonary vascularity is not engorged. The dual-lumen right internal jugular venous catheter tip projects over the midportion of the SVC. IMPRESSION: Successful right sided thoracentesis. No more than a small amount of pleural fluid remains. No postprocedure pneumothorax. Electronically Signed   By: David  Martinique M.D.   On: 04/23/2017 16:10   Dg Chest Port 1 View  Result Date: 04/24/2017 CLINICAL DATA:  Aspiration EXAM: PORTABLE CHEST 1 VIEW COMPARISON:  04/23/2017 FINDINGS: Right-sided central venous catheter tip overlies the distal SVC. Small bilateral effusions. Stable cardiomediastinal silhouette. Interval worsening of diffuse interstitial and alveolar opacity. No pneumothorax. IMPRESSION: 1. Small bilateral effusions 2. Interval worsening of diffuse interstitial and alveolar opacity consistent with worsening edema or diffuse pneumonia Electronically Signed   By: Donavan Foil M.D.   On: 04/24/2017 02:02   Dg Chest Port 1 View  Result Date: 04/23/2017 CLINICAL DATA:  Shortness of breath, ARDS, COPD, healthcare associated pneumonia, current smoker. EXAM: PORTABLE CHEST 1 VIEW COMPARISON:  Chest x-ray of April 21, 2017 FINDINGS: The left lung is well-expanded. The interstitial markings remain increased diffusely. On the right  there remains a moderate-sized pleural effusion. Ended it has decreased slightly in size. The interstitial markings remain increased and the hemidiaphragm obscured. The heart is normal in size. The pulmonary vascularity remains engorged. The dual-lumen dialysis catheter tip projects over the midportion of the SVC. IMPRESSION: Slight interval decrease in the volume of pleural fluid on the right. Persistent pulmonary interstitial edema. Electronically Signed   By: David  Martinique M.D.   On: 04/23/2017 08:52   Ir Thoracentesis Asp Pleural Space W/img Guide  Result Date: 04/23/2017 INDICATION: Shortness of breath, ARDS, COPD, healthcare associated pneumonia, current smoker, pleural effusion. Request for diagnostic and therapeutic thoracentesis. EXAM: ULTRASOUND GUIDED RIGHT THORACENTESIS MEDICATIONS: 2% Lidocaine = 10 mL COMPLICATIONS: None immediate. PROCEDURE: An ultrasound guided thoracentesis was thoroughly discussed with the patient and questions answered. The benefits, risks, alternatives and complications were also discussed. The patient understands and wishes to proceed with  the procedure. Written consent was obtained. Ultrasound was performed to localize and mark an adequate pocket of fluid in the right chest. The area was then prepped and draped in the normal sterile fashion. 1% Lidocaine was used for local anesthesia. Under ultrasound guidance a 6 Fr Safe-T-Centesis catheter was introduced. Thoracentesis was performed. The catheter was removed and a dressing applied. FINDINGS: A total of approximately 1.3 liters of clear yellow fluid was removed. Samples were sent to the laboratory as requested by the clinical team. IMPRESSION: Successful ultrasound guided right thoracentesis yielding 1.3 liters of pleural fluid. No pneumothorax on post procedure chest X-ray. Read by:  Gareth Eagle, PA-C Electronically Signed   By: Corrie Mckusick D.O.   On: 04/23/2017 16:37     Medications:   . sodium chloride    .  sodium chloride 10 mL/hr at 04/19/17 1900  . sodium chloride    . sodium chloride    . dexmedetomidine (PRECEDEX) IV infusion Stopped (04/17/17 1700)  . feeding supplement (VITAL AF 1.2 CAL) Stopped (04/17/17 0905)  . piperacillin-tazobactam (ZOSYN)  IV Stopped (04/24/17 0708)   . darbepoetin (ARANESP) injection - DIALYSIS  200 mcg Intravenous Q Mon-HD  . feeding supplement (ENSURE ENLIVE)  237 mL Oral TID BM  . folic acid  1 mg Oral QHS  . heparin subcutaneous  5,000 Units Subcutaneous Q8H  . insulin aspart  0-9 Units Subcutaneous Q4H  . mouth rinse  15 mL Mouth Rinse BID  . pantoprazole  40 mg Oral QHS  . sodium chloride flush  10-40 mL Intracatheter Q12H  . thiamine  100 mg Oral QHS  . traZODone  50 mg Oral QHS   sodium chloride, sodium chloride, sodium chloride, alteplase, fentaNYL (SUBLIMAZE) injection, heparin, heparin, heparin, ipratropium-albuterol, lidocaine (PF), lidocaine (PF), lidocaine-prilocaine, metoprolol tartrate, ondansetron (ZOFRAN) IV, pentafluoroprop-tetrafluoroeth, sodium chloride flush  Assessment/ Plan:   AKI, improving - Baseline Cr unknown.  Initially on CRRT,  tolerated IHD. Creatinine this morning is 3.15 from 3.42. Improving kidney function with IVF despite lasix yesterday. Patient has been off of lasix with 900 ml UOP in the past 24 hours. Good urine output in the past 24 hrs. patient received one units of pRBCs yesterday and had thoracentesis done. Will continue to monitor kidney function for recovery still no indication for dialysis.  --Follow up on am BMP  Pleural effusion, stable   Patient has a significant smoking history 150 pack year history. Lung         exam is consistent with rhonchi throughout both lung field. Concern           for possible malignancy given history. Thoracentesis perform yesterday. Repeat CXR showed significant improvement in size of pleural effusion. Will follow up on cytology as well as fluid study to determine exudative vs  transudative process as the possible source of pleural effusion. Concern for pneumonia based CXR. Leukocytosis has increase and patient has significant left shift. Patient was already on Zosyn for GI abcess but wold consider additional coverage given worsening WBCs with clear left shift  In the past 24 hrs. --Consider broadening antibx regimen for possible pneumonia  --Follow up on diagnostic thoracentesis.       Hypokalemia, resolving  replete as needed.   Anemia of chronic disease - initiated po iron, Aranesp 200 mcg q Monday  VDRF - s/p extubation, resp status improving  Diverticular abscess s/p drain placed by IR, on Zosyn, repeat imaging per primary. Seen by surgery with recommendations for medical management and  possible surgery in 6-8 weeks.  Hypophosphatemia, Resolved    LOS: [place], MD [place], PGY-2  @TODAY @11 :39 AM

## 2017-04-24 NOTE — Progress Notes (Signed)
PROGRESS NOTE    Dominic Phillips  IRC:[id number] DOB: 1960/08/25 DOA: 04/11/2017 PCP: No primary care provider on file.   Brief Narrative:  Dominic Phillips is a 56 y.o. 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 Transferred from [facility] for management of ARDS and progressive renal insufficiency. This gentleman has a long history of heavy ETOH abuse (~ 18 beers per day) and presented to [place] on 04/02/2017 with a c/o of abdominal pain without fevers, leukocytosis or diarrhea. An abd CT revealed pericolonic inflammation/fluid collection, for which he was admitted for diverticular abscess. Was consulted by Gen Surg who suggested placement of a catheter by interventional radiology; subsequent cultures grew E. Coli, for which Zosyn was initiated. Hospital course was complicated by the development of DTs, treated with 1 beer tid and Ativan prn. He slowly improved but worsened on 10/23 with increased WBC and progressive hypoxemia. Repeat abdominal CT showed improving abscess but developing SBO and multilobar pneumonia. Hypoxemia could not be corrected with BiPAP, along with worsening renal function such that he was intubated on 10/25 and required pressors for hemodynamic support. CT chest/abdomen/pelvis on 10/26 showed bilateral pneumonia with volume overload; no change in the RLQ percutaneous drain catheter. With rising creat to 3.1 and 290 cc U/O over 24 hours, transfer was requested.   He required intubation from 10/27 to 11/2 for ARDS and aspiration pneumonia and pressors for circulatory shock in ICU. He was receiving CRRT for AKI and has tolerated IHD x1. Worsening leukocytosis possibly secondary abdominal abscess. Pleural effusion also worsened so patient underwent Thoracentesis 11/8. Blood Count dropped so he was given 1 unit of pRBC's. Nephrology giving 120 mg of IV Lasix yesterday.   Overnight patient had a coughing episode and vomited and aspirated. He was a  little confused this AM but improved. Throughout the course of the day he remained slightly confused so an ABG was obtained and he was Hypoxemic so his O2 was increased to 15 Liters and repeat ABG ordered and was normal. Continue to Wean O2. Abx broadened.    Assessment & Plan:   Active Problems:   Acute respiratory distress syndrome (ARDS) (HCC)   Acute kidney injury ([place])   Pressure injury of skin   Colonic diverticular abscess   COPD (chronic obstructive pulmonary disease) (HCC)   Dyspnea   HCAP (healthcare-associated pneumonia)   Acute blood loss anemia   Anemia of chronic disease   ETOH abuse   Tobacco abuse   Tachycardia   Tachypnea   Leukocytosis   Post-operative pain  Acute respiratory distress syndrome/VDRF s/p Extubation 11/2 Seen on chest x-ray but possibly secondary to volume overload in setting of acute kidney injury Patient required intubation from 10/27 to 11/2.  -C/w Supplemental O2 via [place] and had to increase to 15 Liters HFNC -ABG done today showed 7.346/47.2/59.7/25.3/89.8% on 10 Liters HFNC; Increased to 15 Liters and ABG improved to 7.375/44.3/86.2/25.3/98.6% -Ordered Thoracentesis yesterday and was done and drained 1.3 Liters -Repeat CXR this AM showed Small bilateral effusions Interval worsening of diffuse interstitial and alveolar opacity consistent with worsening edema or diffuse pneumonia.  Bilateral pleural effusions with Right worse than Left -Patient still on oxygen. No respiratory distress -Nephrology recommendations appreciated -Thoracentesis done and drained 1.3 Liters -Follow up on Fluid Studies  -Repeat CXR showed successful Right Sided Thoracentesis. No more than a small amount of Pleural Fluid Remains. No post-procedure Pneumothorax  Aspiration Pneumonia -Treated with Zosyn. Improved. Course completed. Mild sputum production which  has improved. Chest x-ray with ?progression. -Likely more secondary to effusion. -Aspirated again yesterday so  added IV Vanc to broaden Coverage.  -Add Incentive Spirometry and Flutter Valve -Repeat SLP  Diverticular Abscess -S/p drain. On zosyn per ID. Leukocytosis improved today. CT scan shows minimally improved abscess that is not communicating with drain. -General surgery recommendations appreciated; Plan for now is continue Drain and Abx for perforation and long term plan is to control leak and keep on Abx and have definitive Surgery in 6-8 weeks -Pert General Surgery will need to increase po intake/protein intake so they are checking Pre-Albumin. Will consult Nutritionist  -Pre-Albuminm Low so Nutrition consulted and recommending changing Ensure to QID  -continue IV Zosyn; Added IV Vancomycin   Abdominal wound drainage -Per general surgery recommendations as above  Leukocytosis -Possibly secondary to abdominal abscess. Afebrile. -trend CBC -management per above; WBC went from 17.4 -> 15.1 -> 16.6 -> 20  Acute Kidney Injury, -Patient initially with oliguria requiring CRRT. Had IHD on 11/3 without issue -nephrology recommendations: IHD -BUN/Cr went from 21/3.24 -> 22/3.31 -> 22/3.42 -> 26/3.15 -Nephrology giving 120 mg of IV Lasix yesterday -Continue to Monitor and no dialysis indicated today and Nephrology states foley can be removed   COPD -Patient states he has quit smoking. Mild cough -continue Duoneb prn  Circulatory shock -Patient required Levophed and was weaned off on 04/17/17. -Normotensive.  Ethanol abuse -Out of window for Withdrawals.  Hypokalemia -Supplement with oral potassium 40 mEQ BID -K+ now improved to 3.5  Insomnia -Continue Trazodone -Discontinue Ambien  Normocytic Anemia  -Hb/Hct trended down from 7.7/23.3 -> 7.5/21.9 -> 6.9/20.0 -Transfuse 1 unit of pRBC's and then blood count improved to 8.7/25.5 -Continue to Monitor for S/Sx of bleeding -Repeat CBC in AM   Hypomagnesemia -Patient's Mag Level was 1.6 yesterday AM and improved to  1.8 -Replete with IV Mag Sulfate 1 gram -Continue to Monitor and Replete as Necessary -Repeat Mag Level in AM   Hyperammonemia -Ammonia Level slightly High -Repeat Ammonia Level in AM and if high add Laculose   DVT prophylaxis: Heparin 5,000 units sq q8h Code Status: Partial Code, DNI Family Communication: No family present at bedside Disposition Plan: Remain Inpatient   Consultants:   General Surgery  PCCM  Nephrology  CIR   Procedures:  CRRT IHD   Antimicrobials: Anti-infectives (From admission, onward)   Start     Dose/Rate Route Frequency Ordered Stop   04/24/17 1700  vancomycin (VANCOCIN) IVPB 1000 mg/200 mL premix  Status:  Discontinued     1,000 mg 200 mL/hr over 60 Minutes Intravenous  Once 04/24/17 1652 04/24/17 1656   04/24/17 1700  vancomycin (VANCOCIN) IVPB 1000 mg/200 mL premix     1,000 mg 200 mL/hr over 60 Minutes Intravenous Every 48 hours 04/24/17 1656     04/23/17 1500  piperacillin-tazobactam (ZOSYN) IVPB 3.375 g     3.375 g 12.5 mL/hr over 240 Minutes Intravenous Every 12 hours 04/23/17 1107     04/18/17 1800  piperacillin-tazobactam (ZOSYN) IVPB 3.375 g  Status:  Discontinued     3.375 g 12.5 mL/hr over 240 Minutes Intravenous Every 12 hours 04/18/17 1032 04/23/17 1107   04/18/17 1400  piperacillin-tazobactam (ZOSYN) IVPB 3.375 g  Status:  Discontinued     3.375 g 12.5 mL/hr over 240 Minutes Intravenous Every 8 hours 04/18/17 1031 04/18/17 1032   04/17/17 1200  piperacillin-tazobactam (ZOSYN) IVPB 3.375 g  Status:  Discontinued     3.375 g 100 mL/hr over 30  Minutes Intravenous Every 6 hours 04/17/17 1030 04/18/17 1031   04/17/17 1100  piperacillin-tazobactam (ZOSYN) IVPB 3.375 g  Status:  Discontinued     3.375 g 12.5 mL/hr over 240 Minutes Intravenous Every 8 hours 04/17/17 1019 04/17/17 1030   04/15/17 2125  Ampicillin-Sulbactam (UNASYN) 3 g in sodium chloride 0.9 % 100 mL IVPB  Status:  Discontinued     3 g 200 mL/hr over 30 Minutes  Intravenous Every 8 hours 04/15/17 1529 04/17/17 1019   04/15/17 1100  ampicillin-sulbactam (UNASYN) 1.5 g in sodium chloride 0.9 % 50 mL IVPB  Status:  Discontinued     1.5 g 100 mL/hr over 30 Minutes Intravenous Every 12 hours 04/15/17 0937 04/15/17 1529   04/13/17 2200  levofloxacin (LEVAQUIN) IVPB 500 mg  Status:  Discontinued     500 mg 100 mL/hr over 60 Minutes Intravenous Every 48 hours 04/12/17 0114 04/12/17 0846   04/13/17 1800  piperacillin-tazobactam (ZOSYN) IVPB 2.25 g  Status:  Discontinued     2.25 g 100 mL/hr over 30 Minutes Intravenous Every 8 hours 04/13/17 1050 04/15/17 0937   04/12/17 0200  piperacillin-tazobactam (ZOSYN) IVPB 3.375 g  Status:  Discontinued     3.375 g 12.5 mL/hr over 240 Minutes Intravenous Every 8 hours 04/12/17 0103 04/13/17 1050   04/12/17 0000  levofloxacin (LEVAQUIN) IVPB 750 mg  Status:  Discontinued     750 mg 100 mL/hr over 90 Minutes Intravenous Every 48 hours 04/11/17 2230 04/12/17 0114     Subjective: Seen and examined and he was resting ok. No CP or SOB but states he has been breathing better. States he vomited over night. No other concerns or complaints.   Objective: Vitals:   04/24/17 1534 04/24/17 1625 04/24/17 1923 04/24/17 2005  BP: 130/85 130/85  125/82  Pulse: (!) 110  (!) 106 (!) 110  Resp: 19   (!) 24  Temp: 98.2 F (36.8 C)   98.5 F (36.9 C)  TempSrc: Oral   Oral  SpO2: 93%  96% 91%  Weight:      Height:        Intake/Output Summary (Last 24 hours) at 04/24/2017 2032 Last data filed at 04/24/2017 1811 Gross per 24 hour  Intake 501.83 ml  Output 650 ml  Net -148.17 ml   Filed Weights   04/22/17 0432 04/23/17 0430 04/24/17 0416  Weight: 65.2 kg (143 lb 11.8 oz) 64.4 kg (141 lb 15.6 oz) 67.1 kg (147 lb 14.9 oz)   Examination: Physical Exam:  Constitutional: Thin Caucasian male in NAD appears calm laying in bed.  ENMT: External Ears and nose appear normal. MMM Neck: Supple and has Right IJ Temp  Cath Respiratory: Diminished to auscultation with crackles and mild wheezing. Slightly increased respiratory rate but breathing appears unlabored Cardiovascular: Tachycardic but regular rhythm. No appreciable m/r/g. No extremity edema Abdomen: Soft, NT, Slightly distended and hypertympanic. Has RLQ Drain GU: Deferred. Has foley catheter and dignisheild in place Musculoskeletal: No contractures; No cyanosis Skin: Warm and dry. No rashes or lesions appreciated on a limited skin eval Neurologic: CN 2-12 grossly intact. No appreciable focal deficits Psychiatric: Normal mood and affect. Awake and alert  Data Reviewed: I have personally reviewed following labs and imaging studies  CBC: Recent Labs  Lab 04/20/17 0444 04/21/17 1133 04/22/17 1515 04/23/17 0307 04/24/17 0325  WBC 21.2* 17.4* 15.1* 16.6* 20.8*  NEUTROABS  --   --  12.1* 13.2* 16.4*  HGB 7.3* 7.7* 7.5* 6.9* 8.7*  HCT 21.8*  23.3* 21.9* 20.0* 25.5*  MCV 93.2 94.0 94.0 93.9 91.1  PLT 254 284 313 304 413   Basic Metabolic Panel: Recent Labs  Lab 04/18/17 0440 04/19/17 0336 04/20/17 0444 04/21/17 1133 04/22/17 1515 04/23/17 0307 04/24/17 0325  NA 135 131* 128* 131* 131* 130* 131*  K 3.7 3.3* 2.9* 3.3* 3.4* 3.5 3.5  CL 99* 96* 93* 95* 96* 95* 95*  CO2 25 26 23 25 26 25 26   GLUCOSE 100* 99 65 109* 94 95 87  BUN 20 12 19  21* 22* 22* 26*  CREATININE 2.64* 1.99* 2.79* 3.24* 3.31* 3.42* 3.15*  CALCIUM 7.6* 7.4* 7.3* 7.7* 7.7* 7.6* 7.7*  MG 1.9 1.8  --   --  1.6* 1.6* 1.8  PHOS 3.9 4.1  --   --  4.4 4.4 4.2   GFR: Estimated Creatinine Clearance: 25.4 mL/min (A) (by C-G formula based on SCr of 3.15 mg/dL (H)). Liver Function Tests: Recent Labs  Lab 04/22/17 1515 04/23/17 0307 04/24/17 0325  AST 19 17 18   ALT 9* 10* 9*  ALKPHOS 67 58 65  BILITOT 0.7 0.7 0.4  PROT 6.0* 5.5* 5.7*  ALBUMIN 1.8* 1.7* 1.7*   No results for input(s): LIPASE, AMYLASE in the last 168 hours. Recent Labs  Lab 04/24/17 1609  AMMONIA 54*    Coagulation Profile: No results for input(s): INR, PROTIME in the last 168 hours. Cardiac Enzymes: No results for input(s): CKTOTAL, CKMB, CKMBINDEX, TROPONINI in the last 168 hours. BNP (last 3 results) No results for input(s): PROBNP in the last 8760 hours. HbA1C: No results for input(s): HGBA1C in the last 72 hours. CBG: Recent Labs  Lab 04/24/17 0408 04/24/17 0819 04/24/17 1140 04/24/17 1539 04/24/17 2013  GLUCAP 101* 101* 89 96 133*   Lipid Profile: No results for input(s): CHOL, HDL, LDLCALC, TRIG, CHOLHDL, LDLDIRECT in the last 72 hours. Thyroid Function Tests: No results for input(s): TSH, T4TOTAL, FREET4, T3FREE, THYROIDAB in the last 72 hours. Anemia Panel: No results for input(s): VITAMINB12, FOLATE, FERRITIN, TIBC, IRON, RETICCTPCT in the last 72 hours. Sepsis Labs: No results for input(s): PROCALCITON, LATICACIDVEN in the last 168 hours.  Recent Results (from the past 240 hour(s))  Gram stain     Status: None   Collection Time: 04/23/17  3:54 PM  Result Value Ref Range Status   Specimen Description FLUID PLEURAL  Final   Special Requests NONE  Final   Gram Stain   Final    FEW WBC PRESENT,BOTH PMN AND MONONUCLEAR NO ORGANISMS SEEN    Report Status 04/23/2017 FINAL  Final  Culture, body fluid-bottle     Status: None (Preliminary result)   Collection Time: 04/23/17  3:54 PM  Result Value Ref Range Status   Specimen Description FLUID PLEURAL  Final   Special Requests NONE  Final   Culture NO GROWTH < 24 HOURS  Final   Report Status PENDING  Incomplete     Radiology Studies: Dg Chest 1 View  Result Date: 04/23/2017 CLINICAL DATA:  Status post right thoracentesis. EXAM: CHEST 1 VIEW COMPARISON:  Portable chest x-ray of earlier today. FINDINGS: The volume of pleural fluid on the right has decreased. No postprocedure pneumothorax is observed. There is persistent right basilar atelectasis as well as small amount of pleural fluid. The interstitial markings of  both lungs remain increased. The heart is normal in size. The pulmonary vascularity is not engorged. The dual-lumen right internal jugular venous catheter tip projects over the midportion of the SVC. IMPRESSION: Successful  right sided thoracentesis. No more than a small amount of pleural fluid remains. No postprocedure pneumothorax. Electronically Signed   By: David  Martinique M.D.   On: 04/23/2017 16:10   Dg Chest Port 1 View  Result Date: 04/24/2017 CLINICAL DATA:  Aspiration EXAM: PORTABLE CHEST 1 VIEW COMPARISON:  04/23/2017 FINDINGS: Right-sided central venous catheter tip overlies the distal SVC. Small bilateral effusions. Stable cardiomediastinal silhouette. Interval worsening of diffuse interstitial and alveolar opacity. No pneumothorax. IMPRESSION: 1. Small bilateral effusions 2. Interval worsening of diffuse interstitial and alveolar opacity consistent with worsening edema or diffuse pneumonia Electronically Signed   By: Donavan Foil M.D.   On: 04/24/2017 02:02   Dg Chest Port 1 View  Result Date: 04/23/2017 CLINICAL DATA:  Shortness of breath, ARDS, COPD, healthcare associated pneumonia, current smoker. EXAM: PORTABLE CHEST 1 VIEW COMPARISON:  Chest x-ray of April 21, 2017 FINDINGS: The left lung is well-expanded. The interstitial markings remain increased diffusely. On the right there remains a moderate-sized pleural effusion. Ended it has decreased slightly in size. The interstitial markings remain increased and the hemidiaphragm obscured. The heart is normal in size. The pulmonary vascularity remains engorged. The dual-lumen dialysis catheter tip projects over the midportion of the SVC. IMPRESSION: Slight interval decrease in the volume of pleural fluid on the right. Persistent pulmonary interstitial edema. Electronically Signed   By: David  Martinique M.D.   On: 04/23/2017 08:52   Ir Thoracentesis Asp Pleural Space W/img Guide  Result Date: 04/23/2017 INDICATION: Shortness of breath, ARDS,  COPD, healthcare associated pneumonia, current smoker, pleural effusion. Request for diagnostic and therapeutic thoracentesis. EXAM: ULTRASOUND GUIDED RIGHT THORACENTESIS MEDICATIONS: 2% Lidocaine = 10 mL COMPLICATIONS: None immediate. PROCEDURE: An ultrasound guided thoracentesis was thoroughly discussed with the patient and questions answered. The benefits, risks, alternatives and complications were also discussed. The patient understands and wishes to proceed with the procedure. Written consent was obtained. Ultrasound was performed to localize and mark an adequate pocket of fluid in the right chest. The area was then prepped and draped in the normal sterile fashion. 1% Lidocaine was used for local anesthesia. Under ultrasound guidance a 6 Fr Safe-T-Centesis catheter was introduced. Thoracentesis was performed. The catheter was removed and a dressing applied. FINDINGS: A total of approximately 1.3 liters of clear yellow fluid was removed. Samples were sent to the laboratory as requested by the clinical team. IMPRESSION: Successful ultrasound guided right thoracentesis yielding 1.3 liters of pleural fluid. No pneumothorax on post procedure chest X-ray. Read by:  Gareth Eagle, PA-C Electronically Signed   By: Corrie Mckusick D.O.   On: 04/23/2017 16:37   Scheduled Meds: . darbepoetin (ARANESP) injection - DIALYSIS  200 mcg Intravenous Q Mon-HD  . feeding supplement (ENSURE ENLIVE)  237 mL Oral QID  . folic acid  1 mg Oral QHS  . heparin subcutaneous  5,000 Units Subcutaneous Q8H  . insulin aspart  0-9 Units Subcutaneous Q4H  . mouth rinse  15 mL Mouth Rinse BID  . multivitamin with minerals  1 tablet Oral Daily  . pantoprazole  40 mg Oral QHS  . sodium chloride flush  10-40 mL Intracatheter Q12H  . thiamine  100 mg Oral QHS  . traZODone  50 mg Oral QHS   Continuous Infusions: . sodium chloride    . sodium chloride 10 mL/hr at 04/19/17 1900  . sodium chloride    . sodium chloride    .  dexmedetomidine (PRECEDEX) IV infusion Stopped (04/17/17 1700)  . piperacillin-tazobactam (ZOSYN)  IV  3.375 g (04/24/17 1536)  . vancomycin 1,000 mg (04/24/17 1811)     LOS: 13 days   Kerney Elbe, DO [facility] Pager [phone]  If 7PM-7AM, please contact night-coverage [email or web address] Password [facility] 04/24/2017, 8:32 PM

## 2017-04-24 NOTE — Progress Notes (Signed)
Nutrition Follow-up  DOCUMENTATION CODES:   Non-severe (moderate) malnutrition in context of chronic illness  INTERVENTION:   -Increase Ensure Enlive po to QID, each supplement provides 350 kcal and 20 grams of protein -MVI daily  NUTRITION DIAGNOSIS:   Moderate Malnutrition related to chronic illness(ETOH abuse) as evidenced by mild fat depletion, mild muscle depletion.  Ongoing  GOAL:   Patient will meet greater than or equal to 90% of their needs  Progressing  MONITOR:   PO intake, Supplement acceptance, Labs, I & O's, Weight trends  REASON FOR ASSESSMENT:   Consult Assessment of nutrition requirement/status, Poor PO  ASSESSMENT:   56 year old male with PMH of ETOH abuse who was admitted to [facility] on 10/18 with diverticular abscess; hospital course complicated by DT's, development of a SBO, and PNA; required intubation on 10/25. He was transferred to [facility] on 10/27 with worsening renal failure, ARDS, aspiration PNA.  11/6- hypoglycemic event (CBGS: 62); reviewed nephrology note, no need for HD at this time 11/7- reviewed surgery note; plan to control leak and continue antibiotics; potential surgery in 6-8 weeks 11/8- s/p rt thoracentesis (1.3 L removed)   Case discussed with RN, who confirms pt with poor oral intake.   Spoke with pt, who reports he is eating very little of his meal trays. He shares that he is not eating as he should due to sickness and discomfort (complains that tubes and drain are uncomfortable). Noted meal completion is variable (Po: 10-75%, averaging around 25-50% of meals). Pt really enjoys the Ensure supplements, as he routinely consumes them at home. Discussed with pt importance of good nutritional intake to support healing; pt amenable to eat off what he can off meal trays, but will "take as many Ensures as you can give me". Pt consuming approximately 20-255 of estimated needs via meal intake; RD will increase frequency of Ensure  supplements to help pt meet needs.   Labs reviewed: Na: 131, CBGS: 89- 101 (inpatient orders for glycemic control are 0-9 units insulin aspart every 4 hours).   Diet Order:  Diet heart healthy/carb modified Room service appropriate? Yes; Fluid consistency: Thin  EDUCATION NEEDS:   No education needs have been identified at this time  Skin:  Skin Assessment: Skin Integrity Issues: Skin Integrity Issues:: Stage I Stage I: sacrum  Last BM:  04/24/17 (100 ml output via rectal tube)  Height:   Ht Readings from Last 1 Encounters:  04/11/17 5\' 11"  (1.803 m)    Weight:   Wt Readings from Last 1 Encounters:  04/24/17 147 lb 14.9 oz (67.1 kg)    Ideal Body Weight:  78.2 kg  BMI:  Body mass index is 20.63 kg/m.  Estimated Nutritional Needs:   Kcal:  2000-2200  Protein:  100-115 grams  Fluid:  2-2.2 L    Soila Printup A. Jimmye Norman, RD, LDN, CDE Pager: [phone] After hours Pager: [phone]

## 2017-04-24 NOTE — Progress Notes (Signed)
Clinical Social Worker following patient for disposition plan. CSW spoke to Surveyor, quantity of Social Work regarding possibly need for [employer]. He wants to continue following patients progress before granting LOG for SNF.  Rhea Pink, MSW,  [place]

## 2017-04-24 NOTE — Progress Notes (Addendum)
Tx performed and charted by SPTA under direct guidance and supervision of PTA at all times.  Charting reviewed for accuracy and depicts tx performed and services provided.   Governor Rooks, PTA pager [phone]  Physical Therapy Treatment Patient Details Name: Dominic Phillips MRN: [id number] DOB: 08-18-1960 Today's Date: 04/24/2017    History of Present Illness Pt admitted to [facility] and transferred on 04/02/17 with c/o abdominal pain.  Pt presented with ETOH withdrawls, liver abscess and SBO on TPN.  10/23 presents with hypoxia and increased WBC count, 10/25 intubated, 10/26 scans show PNA and fluid overload, patient extubated on 04/17/17.  Chart review shows COPD, VDRF, anemia, AKI and hypokalemia in addition to previous listed statements.  PMH: ETOH abuse.  11/8 R thoracentesis.    PT Comments    Pt in bed upon arrival. Did not want to ambulate at first but agreed after encouragement. BP 130/85 supine. RN administered pain meds per pt request. Pt required VCs for hand and foot placement when rolling in bed. Mod assist to elevate trunk off bed. Pt able to scoot EOB. Mod assist to power up from EOB and VCs required for correct hand placement. Required one rest break while walking due to fatigue and muscle pain in B LEs. Able to continue ambulating. O2 sats stayed > 90% 10 L/min HF [place] during ambulation. Pt would benefit from skilled PT to increase endurance and functional mobility.   Follow Up Recommendations  CIR     Equipment Recommendations  Rolling walker with 5" wheels;3in1 (PT)    Recommendations for Other Services       Precautions / Restrictions Precautions Precautions: Fall Precaution Comments: Drain, Flexiseal, catheter Restrictions Weight Bearing Restrictions: No    Mobility  Bed Mobility Overal bed mobility: Needs Assistance Bed Mobility: Sidelying to Sit;Rolling Rolling: Min assist Sidelying to sit: Mod assist       General bed mobility comments: VCs for  foot and hand placement  while rolling. Mod assist for elevating trunk from raised HOB.  Transfers Overall transfer level: Needs assistance Equipment used: Rolling walker (2 wheeled) Transfers: Sit to/from Stand Sit to Stand: Mod assist         General transfer comment: Pt required extra time to sit <>stand. Mod assist for power up and to straighten trunk. Verbal and tactile cues to push off from bed not RW.  Ambulation/Gait Ambulation/Gait assistance: Min assist;+2 safety/equipment Ambulation Distance (Feet): 180 Feet Assistive device: Rolling walker (2 wheeled) Gait Pattern/deviations: Step-through pattern;Decreased step length - right;Decreased step length - left;Drifts right/left;Narrow base of support Gait velocity: slow   General Gait Details: Pt's O2 sat stayed > 90% while ambulating on 10 L/min high flow [place]. VCs for neutral head position, posture, and breathing. Pt required one rest break due to fatigue in B LEs. Pt fatigued by end of ambulation.   Stairs            Wheelchair Mobility    Modified Rankin (Stroke Patients Only)       Balance Overall balance assessment: Needs assistance Sitting-balance support: No upper extremity supported;Feet supported Sitting balance-Leahy Scale: Fair Sitting balance - Comments: Pt with right lateral lean and posterior lean needing VCs to sit upright.  Postural control: Posterior lean;Right lateral lean Standing balance support: Bilateral upper extremity supported Standing balance-Leahy Scale: Poor Standing balance comment: Relies on UE support and was leaning onto SPTA towards the end of ambulation due to fatigue.  Cognition Arousal/Alertness: Awake/alert Behavior During Therapy: WFL for tasks assessed/performed Overall Cognitive Status: Within Functional Limits for tasks assessed                                        Exercises      General Comments         Pertinent Vitals/Pain Pain Assessment: No/denies pain Pain Score: 10-Worst pain ever Pain Location: anterior abdominal area Pain Descriptors / Indicators: Aching;Grimacing;Guarding;Moaning Pain Intervention(s): Limited activity within patient's tolerance;Monitored during session;Repositioned    Home Living                      Prior Function            PT Goals (current goals can now be found in the care plan section) Acute Rehab PT Goals Patient Stated Goal: none discussed PT Goal Formulation: With patient Time For Goal Achievement: 05/02/17 Potential to Achieve Goals: Good Progress towards PT goals: Progressing toward goals    Frequency    Min 3X/week      PT Plan Current plan remains appropriate    Co-evaluation              AM-PAC PT "6 Clicks" Daily Activity  Outcome Measure  Difficulty turning over in bed (including adjusting bedclothes, sheets and blankets)?: A Lot Difficulty moving from lying on back to sitting on the side of the bed? : A Lot Difficulty sitting down on and standing up from a chair with arms (e.g., wheelchair, bedside commode, etc,.)?: A Lot Help needed moving to and from a bed to chair (including a wheelchair)?: A Little Help needed walking in hospital room?: A Little Help needed climbing 3-5 steps with a railing? : Total 6 Click Score: 13    End of Session Equipment Utilized During Treatment: Oxygen;Gait belt Activity Tolerance: Patient tolerated treatment well Patient left: with call bell/phone within reach;in chair;with chair alarm set Nurse Communication: Mobility status PT Visit Diagnosis: Unsteadiness on feet (R26.81);Muscle weakness (generalized) (M62.81);Other abnormalities of gait and mobility (R26.89)     Time: 0947-0962 PT Time Calculation (min) (ACUTE ONLY): 52 min  Charges:  $Gait Training: 8-22 mins $Therapeutic Activity: 23-37 mins                    G Codes:       Janna Arch, SPTA   Janna Arch 04/24/2017, 5:12 PM

## 2017-04-24 NOTE — Progress Notes (Addendum)
Called by charge RN to assess patient for low oxygen saturations after vomiting.  Patient is confused and agitated at times as well.  Per primary RN, patient has been confused for her.  Patient was confused when I arrived but easily reoriented, did follow commands, RT had already place patient on HFNC 8L, sats were 93-95%, RR 18-22.  Patient was not in respiratory distress and improved when I arrived. Good air movement bilateral, lung sounds clear-diminished per nurse. Chest xray was ordered per primary service.   RRT Interventions: none.   I asked that primary service be update about patient's confusion and agitation, of note, patient has received PRN Fentanyl for pain and trazadone earlier tonight, perhaps contributing to confusion.    Call Time141 Arrival Time 151 End Time 220

## 2017-04-24 NOTE — Care Management Note (Addendum)
Case Management Note  Patient Details  Name: Dominic Phillips MRN: [id number] Date of Birth: May 28, 1961  Subjective/Objective:    Pt admitted with AKI and ARDS       Action/Plan:  PTA from home.  Pt extubated 04/17/17 but still requiring CRRT.  CSW consulted for substance abuse and potential placement.  CM will continue to follow for discharge needs   Expected Discharge Date:                  Expected Discharge Plan:  [place]  In-House Referral:  Clinical Social Work  Discharge planning Services  CM Consult  Post Acute Care Choice:    Choice offered to:     DME Arranged:    DME Agency:     HH Arranged:    [place] Agency:     Status of Service:     If discussed at [employer], dates discussed:    Additional Comments:   04/24/2017  CM discussed pt with attending - pt is still not appropriate for discharge to a facility; post thoracentesis on yesterday pt aspirated.  CSW notified and CIR -  will continue to follow for discharge needs  04/23/17 Discussed in LOS 04/23/17 - pt remains appropriate for continued stay.  Pt has increasing pleural effusion and will require a thoracentesis, remains on IV zosyn.  Discharge plan continues to be SNF - CSW actively working placement   04/21/17 Discussed in LOS 11/6 - remains appropriate for continued stay- JP drain still in place, WBC trending up, IV antibiotics.  Pt is also being followed by renal - pt has received 1 IHD treatment with daily assessments for additional HD sessions.  Maryclare Labrador, RN 04/24/2017, 11:55 AM

## 2017-04-24 NOTE — Progress Notes (Addendum)
Inpatient Rehabilitation  Continuing to follow for timing of medical readiness, therapy tolerance, and IP Rehab bed availability.  Note yesterday and this morning's events.  Discussed case with nurse case manager.  Plan to follow up with patient and team Monday, 04/27/17.  Call if questions.   Carmelia Roller., CCC/SLP Admission Coordinator  [place]  Cell [phone]

## 2017-04-24 NOTE — Progress Notes (Addendum)
Pt coughing up thick white phlegm stating he couldn't breath, attempting to sit up in bed when RN entered room. HOB raised up. Sats dropping to the 80's on 6L via [place]. Pt dry heaving and gagging stated he has to vomit. RT called to room. PRN Zofran given for nausea, pt placed on NRB sats came up to 97%. RT placed pt on 8L HFNC. MD paged for questionable aspiration. Stat CXR ordered. Pt answers orientation question correctly but is slightly confused which is pt baseline since beginning of shift. Rapid Response RN up to see patient.  MD paged after CXR resulted and made aware of pt baseline confusion.    Update: 0316 Pt resting comfortablly asleep on 8L HFNC sats in the 94%. Will continue to monitor.

## 2017-04-25 ENCOUNTER — Inpatient Hospital Stay (HOSPITAL_COMMUNITY): Payer: Medicaid Other

## 2017-04-25 LAB — CBC WITH DIFFERENTIAL/PLATELET
Basophils Absolute: 0.2 10*3/uL — ABNORMAL HIGH (ref 0.0–0.1)
Basophils Relative: 1 %
EOS ABS: 0.6 10*3/uL (ref 0.0–0.7)
Eosinophils Relative: 3 %
HCT: 24.2 % — ABNORMAL LOW (ref 39.0–52.0)
Hemoglobin: 8.1 g/dL — ABNORMAL LOW (ref 13.0–17.0)
Lymphocytes Relative: 12 %
Lymphs Abs: 2.4 10*3/uL (ref 0.7–4.0)
MCH: 30.7 pg (ref 26.0–34.0)
MCHC: 33.5 g/dL (ref 30.0–36.0)
MCV: 91.7 fL (ref 78.0–100.0)
MONO ABS: 1.6 10*3/uL — AB (ref 0.1–1.0)
MONOS PCT: 8 %
NEUTROS ABS: 14.9 10*3/uL — AB (ref 1.7–7.7)
Neutrophils Relative %: 76 %
PLATELETS: 334 10*3/uL (ref 150–400)
RBC: 2.64 MIL/uL — ABNORMAL LOW (ref 4.22–5.81)
RDW: 17.9 % — AB (ref 11.5–15.5)
WBC: 19.7 10*3/uL — AB (ref 4.0–10.5)

## 2017-04-25 LAB — GLUCOSE, CAPILLARY
GLUCOSE-CAPILLARY: 100 mg/dL — AB (ref 65–99)
GLUCOSE-CAPILLARY: 102 mg/dL — AB (ref 65–99)
GLUCOSE-CAPILLARY: 129 mg/dL — AB (ref 65–99)
Glucose-Capillary: 104 mg/dL — ABNORMAL HIGH (ref 65–99)
Glucose-Capillary: 106 mg/dL — ABNORMAL HIGH (ref 65–99)
Glucose-Capillary: 120 mg/dL — ABNORMAL HIGH (ref 65–99)
Glucose-Capillary: 120 mg/dL — ABNORMAL HIGH (ref 65–99)

## 2017-04-25 LAB — COMPREHENSIVE METABOLIC PANEL
ALBUMIN: 1.8 g/dL — AB (ref 3.5–5.0)
ALT: 9 U/L — ABNORMAL LOW (ref 17–63)
AST: 18 U/L (ref 15–41)
Alkaline Phosphatase: 64 U/L (ref 38–126)
Anion gap: 11 (ref 5–15)
BUN: 28 mg/dL — AB (ref 6–20)
CHLORIDE: 95 mmol/L — AB (ref 101–111)
CO2: 25 mmol/L (ref 22–32)
Calcium: 7.9 mg/dL — ABNORMAL LOW (ref 8.9–10.3)
Creatinine, Ser: 3.23 mg/dL — ABNORMAL HIGH (ref 0.61–1.24)
GFR calc Af Amer: 23 mL/min — ABNORMAL LOW (ref >60)
GFR, EST NON AFRICAN AMERICAN: 20 mL/min — AB (ref >60)
GLUCOSE: 70 mg/dL (ref 65–99)
Potassium: 3.5 mmol/L (ref 3.5–5.1)
Sodium: 131 mmol/L — ABNORMAL LOW (ref 135–145)
Total Bilirubin: 0.4 mg/dL (ref 0.3–1.2)
Total Protein: 6.5 g/dL (ref 6.5–8.1)

## 2017-04-25 LAB — AMMONIA: AMMONIA: 39 umol/L — AB (ref 9–35)

## 2017-04-25 LAB — MAGNESIUM: MAGNESIUM: 1.8 mg/dL (ref 1.7–2.4)

## 2017-04-25 LAB — PHOSPHORUS: Phosphorus: 4.5 mg/dL (ref 2.5–4.6)

## 2017-04-25 MED ORDER — FUROSEMIDE 10 MG/ML IJ SOLN
60.0000 mg | Freq: Once | INTRAMUSCULAR | Status: AC
  Administered 2017-04-25: 60 mg via INTRAVENOUS
  Filled 2017-04-25: qty 6

## 2017-04-25 MED ORDER — IPRATROPIUM-ALBUTEROL 0.5-2.5 (3) MG/3ML IN SOLN
3.0000 mL | Freq: Four times a day (QID) | RESPIRATORY_TRACT | Status: DC
  Administered 2017-04-25: 3 mL via RESPIRATORY_TRACT
  Filled 2017-04-25: qty 3

## 2017-04-25 MED ORDER — GUAIFENESIN ER 600 MG PO TB12
1200.0000 mg | ORAL_TABLET | Freq: Two times a day (BID) | ORAL | Status: DC
  Administered 2017-04-25 – 2017-05-12 (×35): 1200 mg via ORAL
  Filled 2017-04-25 (×37): qty 2

## 2017-04-25 MED ORDER — IPRATROPIUM-ALBUTEROL 0.5-2.5 (3) MG/3ML IN SOLN
3.0000 mL | Freq: Three times a day (TID) | RESPIRATORY_TRACT | Status: DC
  Administered 2017-04-25 – 2017-04-30 (×15): 3 mL via RESPIRATORY_TRACT
  Filled 2017-04-25 (×15): qty 3

## 2017-04-25 NOTE — Progress Notes (Signed)
PROGRESS NOTE    Dominic Phillips  SWF:[id number] DOB: Nov 24, 1960 DOA: 04/11/2017 PCP: No primary care provider on file.   Brief Narrative:  Dominic Phillips is a 56 y.o. 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 Transferred from [facility] for management of ARDS and progressive renal insufficiency. This gentleman has a long history of heavy ETOH abuse (~ 18 beers per day) and presented to [place] on 04/02/2017 with a c/o of abdominal pain without fevers, leukocytosis or diarrhea. An abd CT revealed pericolonic inflammation/fluid collection, for which he was admitted for diverticular abscess. Was consulted by Gen Surg who suggested placement of a catheter by interventional radiology; subsequent cultures grew E. Coli, for which Zosyn was initiated. Hospital course was complicated by the development of DTs, treated with 1 beer tid and Ativan prn. He slowly improved but worsened on 10/23 with increased WBC and progressive hypoxemia. Repeat abdominal CT showed improving abscess but developing SBO and multilobar pneumonia. Hypoxemia could not be corrected with BiPAP, along with worsening renal function such that he was intubated on 10/25 and required pressors for hemodynamic support. CT chest/abdomen/pelvis on 10/26 showed bilateral pneumonia with volume overload; no change in the RLQ percutaneous drain catheter. With rising creat to 3.1 and 290 cc U/O over 24 hours, transfer was requested.   He required intubation from 10/27 to 11/2 for ARDS and aspiration pneumonia and pressors for circulatory shock in ICU. He was receiving CRRT for AKI and has tolerated IHD x1. Worsening leukocytosis possibly secondary abdominal abscess. Pleural effusion also worsened so patient underwent Thoracentesis 11/8. Blood Count dropped so he was given 1 unit of pRBC's. Nephrology giving 120 mg of IV Lasix yesterday.   Overnight patient had a coughing episode and vomited and aspirated. He was a  little confused this AM but improved. Throughout the course of the day he remained slightly confused so an ABG was obtained and he was Hypoxemic so his O2 was increased to 15 Liters and repeat ABG ordered and was normal. Continue to Wean O2. Abx broadened and Vanc added. Patient had breathing treatments added today and was going try to be weaned down on his O2.   Assessment & Plan:   Active Problems:   Acute respiratory distress syndrome (ARDS) (HCC)   Acute kidney injury ([place])   Pressure injury of skin   Colonic diverticular abscess   COPD (chronic obstructive pulmonary disease) (HCC)   Dyspnea   HCAP (healthcare-associated pneumonia)   Acute blood loss anemia   Anemia of chronic disease   ETOH abuse   Tobacco abuse   Tachycardia   Tachypnea   Leukocytosis   Post-operative pain  Acute respiratory distress syndrome/VDRF s/p Extubation 11/2 Seen on chest x-ray but possibly secondary to volume overload in setting of acute kidney injury Patient required intubation from 10/27 to 11/2.  -C/w Supplemental O2 via [place] and had to increase to 15 Liters HFNC -ABG done today showed 7.346/47.2/59.7/25.3/89.8% on 10 Liters HFNC; Increased to 15 Liters and ABG improved to 7.375/44.3/86.2/25.3/98.6% -Ordered Thoracentesis yesterday and was done and drained 1.3 Liters -Repeat CXR yesterday 11/9 showed Small bilateral effusions Interval worsening of diffuse interstitial and alveolar opacity consistent with worsening edema or diffuse pneumonia. -CXR this AM showed Pulmonary vascular congestion. Diffuse interstitial pulmonary edema with patchy alveolar pulmonary edema bilaterally. Superimposed pneumonia, particularly in the right mid to lower lung zone, cannot be excluded. Appearance similar to 1 day prior, although somewhat deeper degree of inspiration on current  examination. No new opacity appreciable. Small granuloma left base, stable -Will change IV Zosyn to IV Cefepime and continue IV Vancomycin for  now.  -Will likely need to discuss case with Infectious Diseases in AM -Will try 60 of IV Lasix today   Bilateral pleural effusions with Right worse than Left, improved -Patient still on oxygen. No respiratory distress -Nephrology recommendations appreciated -Thoracentesis done and drained 1.3 Liters -Follow up on Fluid Studies  -Repeat CXR showed successful Right Sided Thoracentesis. No more than a small amount of Pleural Fluid Remains. No post-procedure Pneumothorax -CXR today showed Pulmonary Vascular Congestion and Diffuse interstitial Pulmonary Edema with patchy alveolar edema bilaterally. Superimposed PNA in the Right to Mid to Lower Lung Zone could not be excluded.  -Try 60 mg of IV Lasix today   Aspiration Pneumonia / HCAP -Treated with Zosyn. Improved. Course completed. Mild sputum production which has improved. Chest x-ray with ?progression. -Likely more secondary to effusion. -Aspirated again yesterday so added IV Vanc to broaden Coverage. Continue IV Zosyn that was  -Add Incentive Spirometry and Flutter Valve -Added DuoNeb Scheduled and Mucinex -Repeat SLP ok and showed that patient is mild Aspiration risk   Diverticular Abscess -S/p drain. On zosyn per ID. Leukocytosis worsened yesterday but slightly improved. CT scan shows minimally improved abscess that is not communicating with drain. -General surgery recommendations appreciated; Plan for now is continue Drain and Abx for perforation and long term plan is to control leak and keep on Abx and have definitive Surgery in 6-8 weeks -Pert General Surgery will need to increase po intake/protein intake so they are checking Pre-Albumin. Will consult Nutritionist  -Pre-Albuminm Low so Nutrition consulted and recommending changing Ensure to QID  -Continue IV Zosyn; Added IV Vancomycin   Abdominal wound drainage -Per general surgery recommendations as above  Leukocytosis -Possibly secondary to abdominal abscess and likley PNA.  Afebrile. -trend CBC -management per above; WBC went from 17.4 -> 15.1 -> 16.6 -> 20.8 -> 19.7  Acute Kidney Injury, -Patient initially with oliguria requiring CRRT. Had IHD on 11/3 without issue -nephrology recommendations: IHD -BUN/Cr went from 21/3.24 -> 22/3.31 -> 22/3.42 -> 26/3.15 -> 28/3.23 -Nephrology giving 120 mg of IV Lasix on 11/8.  -Will try IV 60 Lasix today given CXR findings and Hypoxia -Continue to Monitor and no dialysis indicated today and Nephrology states foley can be removed   COPD -Patient states he has quit smoking. Mild cough -continue Duoneb Scheduled and PRN -Added Mucinex  Circulatory shock -Patient required Levophed and was weaned off on 04/17/17. -Normotensive. -Continue to Monitor Hemodynamic Status   Ethanol abuse -Out of window for Withdrawals.  Hypokalemia -Supplement with oral potassium 40 mEQ BID -K+ now improved to 3.5  Insomnia -Continue Trazodone -Discontinue Ambien  Normocytic Anemia  -Hb/Hct trended down from 7.7/23.3 -> 7.5/21.9 -> 6.9/20.0 -Transfuse 1 unit of pRBC's and then blood count improved to 8.7/25.5 and now is 8.1/24.2 -Continue to Monitor for S/Sx of bleeding -Repeat CBC in AM   Hypomagnesemia -Patient's Mag Level was 1.6 yesterday AM and improved to 1.8 -Continue to Monitor and Replete as Necessary -Repeat Mag Level in AM   Hyperammonemia -Ammonia Level slightly High at 54, Repeat was 39 -Repeat Ammonia Level in AM and if higher or worse add Laculose   DVT prophylaxis: Heparin 5,000 units sq q8h Code Status: Partial Code, DNI Family Communication: No family present at bedside Disposition Plan: Remain Inpatient   Consultants:   General Surgery  PCCM  Nephrology  CIR  Procedures:  CRRT IHD   Antimicrobials: Anti-infectives (From admission, onward)   Start     Dose/Rate Route Frequency Ordered Stop   04/24/17 1700  vancomycin (VANCOCIN) IVPB 1000 mg/200 mL premix  Status:  Discontinued       1,000 mg 200 mL/hr over 60 Minutes Intravenous  Once 04/24/17 1652 04/24/17 1656   04/24/17 1700  vancomycin (VANCOCIN) IVPB 1000 mg/200 mL premix     1,000 mg 200 mL/hr over 60 Minutes Intravenous Every 48 hours 04/24/17 1656     04/23/17 1500  piperacillin-tazobactam (ZOSYN) IVPB 3.375 g     3.375 g 12.5 mL/hr over 240 Minutes Intravenous Every 12 hours 04/23/17 1107     04/18/17 1800  piperacillin-tazobactam (ZOSYN) IVPB 3.375 g  Status:  Discontinued     3.375 g 12.5 mL/hr over 240 Minutes Intravenous Every 12 hours 04/18/17 1032 04/23/17 1107   04/18/17 1400  piperacillin-tazobactam (ZOSYN) IVPB 3.375 g  Status:  Discontinued     3.375 g 12.5 mL/hr over 240 Minutes Intravenous Every 8 hours 04/18/17 1031 04/18/17 1032   04/17/17 1200  piperacillin-tazobactam (ZOSYN) IVPB 3.375 g  Status:  Discontinued     3.375 g 100 mL/hr over 30 Minutes Intravenous Every 6 hours 04/17/17 1030 04/18/17 1031   04/17/17 1100  piperacillin-tazobactam (ZOSYN) IVPB 3.375 g  Status:  Discontinued     3.375 g 12.5 mL/hr over 240 Minutes Intravenous Every 8 hours 04/17/17 1019 04/17/17 1030   04/15/17 2125  Ampicillin-Sulbactam (UNASYN) 3 g in sodium chloride 0.9 % 100 mL IVPB  Status:  Discontinued     3 g 200 mL/hr over 30 Minutes Intravenous Every 8 hours 04/15/17 1529 04/17/17 1019   04/15/17 1100  ampicillin-sulbactam (UNASYN) 1.5 g in sodium chloride 0.9 % 50 mL IVPB  Status:  Discontinued     1.5 g 100 mL/hr over 30 Minutes Intravenous Every 12 hours 04/15/17 0937 04/15/17 1529   04/13/17 2200  levofloxacin (LEVAQUIN) IVPB 500 mg  Status:  Discontinued     500 mg 100 mL/hr over 60 Minutes Intravenous Every 48 hours 04/12/17 0114 04/12/17 0846   04/13/17 1800  piperacillin-tazobactam (ZOSYN) IVPB 2.25 g  Status:  Discontinued     2.25 g 100 mL/hr over 30 Minutes Intravenous Every 8 hours 04/13/17 1050 04/15/17 0937   04/12/17 0200  piperacillin-tazobactam (ZOSYN) IVPB 3.375 g  Status:   Discontinued     3.375 g 12.5 mL/hr over 240 Minutes Intravenous Every 8 hours 04/12/17 0103 04/13/17 1050   04/12/17 0000  levofloxacin (LEVAQUIN) IVPB 750 mg  Status:  Discontinued     750 mg 100 mL/hr over 90 Minutes Intravenous Every 48 hours 04/11/17 2230 04/12/17 0114     Subjective: Seen and examined and he was awoken from sleep. States he had an ok night. Nursing states he became delirious and agitated so they had to put mittens on him that have been removed. No CP. States SOB is stable and states abdomen is paining and always has. No other concerns or complaints at this time.   Objective: Vitals:   04/25/17 1124 04/25/17 1357 04/25/17 1411 04/25/17 1552  BP: 118/80   (!) 137/93  Pulse: (!) 116 (!) 119  (!) 117  Resp: (!) 22 (!) 24  18  Temp: 98.4 F (36.9 C)   97.8 F (36.6 C)  TempSrc: Oral   Oral  SpO2: 90% 91% 92% 91%  Weight:      Height:  Intake/Output Summary (Last 24 hours) at 04/25/2017 1915 Last data filed at 04/25/2017 1558 Gross per 24 hour  Intake 868.17 ml  Output 1825 ml  Net -956.83 ml   Filed Weights   04/23/17 0430 04/24/17 0416 04/25/17 0400  Weight: 64.4 kg (141 lb 15.6 oz) 67.1 kg (147 lb 14.9 oz) 64.6 kg (142 lb 8 oz)   Examination: Physical Exam:  Constitutional: Thin Caucasian male in NAD appears calm laying in bed ENMT: External Ears and nose appear normal. MMM Neck: Supple and has Right IJ Temp Dialysis Cath Respiratory: Diminished to auscultation with diffuse crackles and some wheezing. Unlabored breathing but wearing 15 L of HFNC Cardiovascular: Tachycardic but regular rhythm. No appreciable m/r/g. No extremity edema Abdomen: Soft, Mildly tender to palpate, Slightly distended and hypertympanic. Has a RLQ drain in place GU: Deferred. Has foley and dignisheild in. Musculoskeletal: No contractures. No cyanosis Skin: Warm and dry. No rashes or lesions on a limited skin eval Neurologic: CN 2-12 grossly intact. No appreciable focal  deficits Psychiatric: Normal mood and affect. Awake and alert now.  Data Reviewed: I have personally reviewed following labs and imaging studies  CBC: Recent Labs  Lab 04/21/17 1133 04/22/17 1515 04/23/17 0307 04/24/17 0325 04/25/17 0258  WBC 17.4* 15.1* 16.6* 20.8* 19.7*  NEUTROABS  --  12.1* 13.2* 16.4* 14.9*  HGB 7.7* 7.5* 6.9* 8.7* 8.1*  HCT 23.3* 21.9* 20.0* 25.5* 24.2*  MCV 94.0 94.0 93.9 91.1 91.7  PLT 284 313 304 345 195   Basic Metabolic Panel: Recent Labs  Lab 04/19/17 0336  04/21/17 1133 04/22/17 1515 04/23/17 0307 04/24/17 0325 04/25/17 0258  NA 131*   < > 131* 131* 130* 131* 131*  K 3.3*   < > 3.3* 3.4* 3.5 3.5 3.5  CL 96*   < > 95* 96* 95* 95* 95*  CO2 26   < > 25 26 25 26 25   GLUCOSE 99   < > 109* 94 95 87 70  BUN 12   < > 21* 22* 22* 26* 28*  CREATININE 1.99*   < > 3.24* 3.31* 3.42* 3.15* 3.23*  CALCIUM 7.4*   < > 7.7* 7.7* 7.6* 7.7* 7.9*  MG 1.8  --   --  1.6* 1.6* 1.8 1.8  PHOS 4.1  --   --  4.4 4.4 4.2 4.5   < > = values in this interval not displayed.   GFR: Estimated Creatinine Clearance: 23.9 mL/min (A) (by C-G formula based on SCr of 3.23 mg/dL (H)). Liver Function Tests: Recent Labs  Lab 04/22/17 1515 04/23/17 0307 04/24/17 0325 04/25/17 0258  AST 19 17 18 18   ALT 9* 10* 9* 9*  ALKPHOS 67 58 65 64  BILITOT 0.7 0.7 0.4 0.4  PROT 6.0* 5.5* 5.7* 6.5  ALBUMIN 1.8* 1.7* 1.7* 1.8*   No results for input(s): LIPASE, AMYLASE in the last 168 hours. Recent Labs  Lab 04/24/17 1609 04/25/17 0842  AMMONIA 54* 39*   Coagulation Profile: No results for input(s): INR, PROTIME in the last 168 hours. Cardiac Enzymes: No results for input(s): CKTOTAL, CKMB, CKMBINDEX, TROPONINI in the last 168 hours. BNP (last 3 results) No results for input(s): PROBNP in the last 8760 hours. HbA1C: No results for input(s): HGBA1C in the last 72 hours. CBG: Recent Labs  Lab 04/25/17 0009 04/25/17 0413 04/25/17 0813 04/25/17 1130 04/25/17 1555    GLUCAP 129* 100* 104* 106* 102*   Lipid Profile: No results for input(s): CHOL, HDL, LDLCALC, TRIG, CHOLHDL, LDLDIRECT  in the last 72 hours. Thyroid Function Tests: No results for input(s): TSH, T4TOTAL, FREET4, T3FREE, THYROIDAB in the last 72 hours. Anemia Panel: No results for input(s): VITAMINB12, FOLATE, FERRITIN, TIBC, IRON, RETICCTPCT in the last 72 hours. Sepsis Labs: No results for input(s): PROCALCITON, LATICACIDVEN in the last 168 hours.  Recent Results (from the past 240 hour(s))  Gram stain     Status: None   Collection Time: 04/23/17  3:54 PM  Result Value Ref Range Status   Specimen Description FLUID PLEURAL  Final   Special Requests NONE  Final   Gram Stain   Final    FEW WBC PRESENT,BOTH PMN AND MONONUCLEAR NO ORGANISMS SEEN    Report Status 04/23/2017 FINAL  Final  Culture, body fluid-bottle     Status: None (Preliminary result)   Collection Time: 04/23/17  3:54 PM  Result Value Ref Range Status   Specimen Description FLUID PLEURAL  Final   Special Requests NONE  Final   Culture NO GROWTH 2 DAYS  Final   Report Status PENDING  Incomplete     Radiology Studies: Dg Chest Port 1 View  Result Date: 04/25/2017 CLINICAL DATA:  Shortness of Breath EXAM: PORTABLE CHEST 1 VIEW COMPARISON:  April 24, 2017 FINDINGS: Central catheter tip is in the superior vena cava. No pneumothorax. There is diffuse interstitial edema. There is patchy airspace consolidation throughout the left mid lung and right mid lower lung zones with consolidation greatest in the right mid lower lung zone regions. There is a small right pleural effusion. There is a small calcified granuloma left base. Heart is upper normal in size with pulmonary venous hypertension. No adenopathy evident. No bone lesions. IMPRESSION: Pulmonary vascular congestion. Diffuse interstitial pulmonary edema with patchy alveolar pulmonary edema bilaterally. Superimposed pneumonia, particularly in the right mid to lower lung  zone, cannot be excluded. Appearance similar to 1 day prior, although somewhat deeper degree of inspiration on current examination. No new opacity appreciable. Small granuloma left base, stable. Electronically Signed   By: Lowella Grip III M.D.   On: 04/25/2017 07:37   Dg Chest Port 1 View  Result Date: 04/24/2017 CLINICAL DATA:  Aspiration EXAM: PORTABLE CHEST 1 VIEW COMPARISON:  04/23/2017 FINDINGS: Right-sided central venous catheter tip overlies the distal SVC. Small bilateral effusions. Stable cardiomediastinal silhouette. Interval worsening of diffuse interstitial and alveolar opacity. No pneumothorax. IMPRESSION: 1. Small bilateral effusions 2. Interval worsening of diffuse interstitial and alveolar opacity consistent with worsening edema or diffuse pneumonia Electronically Signed   By: Donavan Foil M.D.   On: 04/24/2017 02:02   Scheduled Meds: . darbepoetin (ARANESP) injection - DIALYSIS  200 mcg Intravenous Q Mon-HD  . feeding supplement (ENSURE ENLIVE)  237 mL Oral QID  . folic acid  1 mg Oral QHS  . guaiFENesin  1,200 mg Oral BID  . heparin subcutaneous  5,000 Units Subcutaneous Q8H  . insulin aspart  0-9 Units Subcutaneous Q4H  . ipratropium-albuterol  3 mL Nebulization TID  . mouth rinse  15 mL Mouth Rinse BID  . multivitamin with minerals  1 tablet Oral Daily  . pantoprazole  40 mg Oral QHS  . sodium chloride flush  10-40 mL Intracatheter Q12H  . thiamine  100 mg Oral QHS  . traZODone  50 mg Oral QHS   Continuous Infusions: . sodium chloride    . sodium chloride 10 mL/hr at 04/19/17 1900  . sodium chloride    . sodium chloride    . dexmedetomidine (PRECEDEX) IV  infusion Stopped (04/17/17 1700)  . piperacillin-tazobactam (ZOSYN)  IV 3.375 g (04/25/17 1507)  . vancomycin Stopped (04/24/17 2133)     LOS: 14 days   Kerney Elbe, DO [facility] Pager [phone]  If 7PM-7AM, please contact night-coverage [email or web address] Password [email or web address] 04/25/2017, 7:15  PM

## 2017-04-25 NOTE — Progress Notes (Signed)
R IJ temporary hd cath removed intact. All sutures removed.   vaseline pressure gauze dsg. Applied. Pressure held x 5 mins.  No bleeding at this time.

## 2017-04-25 NOTE — Progress Notes (Signed)
Subjective/Chief Complaint: Pt in good spirits some mild diffuse pain bowels moving   Objective: Vital signs in last 24 hours: Temp:  [97.7 F (36.5 C)-99.7 F (37.6 C)] 98.5 F (36.9 C) (11/10 0800) Pulse Rate:  [106-121] 121 (11/10 0800) Resp:  [18-29] 29 (11/10 0800) BP: (121-138)/(70-88) 134/81 (11/10 0800) SpO2:  [91 %-96 %] 94 % (11/10 0800) Weight:  [64.6 kg (142 lb 8 oz)] 64.6 kg (142 lb 8 oz) (11/10 0400) Last BM Date: 04/25/17  Intake/Output from previous day: 11/09 0701 - 11/10 0700 In: 1060 [P.O.:720; I.V.:240; IV Piggyback:100] Out: 1425 [Urine:1300; Drains:25; Stool:100] Intake/Output this shift: Total I/O In: -  Out: 250 [Urine:250]  GI: drain in place   feculent  soft mild distention  Lab Results:  Recent Labs    04/24/17 0325 04/25/17 0258  WBC 20.8* 19.7*  HGB 8.7* 8.1*  HCT 25.5* 24.2*  PLT 345 334   BMET Recent Labs    04/24/17 0325 04/25/17 0258  NA 131* 131*  K 3.5 3.5  CL 95* 95*  CO2 26 25  GLUCOSE 87 70  BUN 26* 28*  CREATININE 3.15* 3.23*  CALCIUM 7.7* 7.9*   PT/INR No results for input(s): LABPROT, INR in the last 72 hours. ABG Recent Labs    04/24/17 1510 04/24/17 1730  PHART 7.346* 7.375  HCO3 25.3 25.3    Studies/Results: Dg Chest 1 View  Result Date: 04/23/2017 CLINICAL DATA:  Status post right thoracentesis. EXAM: CHEST 1 VIEW COMPARISON:  Portable chest x-ray of earlier today. FINDINGS: The volume of pleural fluid on the right has decreased. No postprocedure pneumothorax is observed. There is persistent right basilar atelectasis as well as small amount of pleural fluid. The interstitial markings of both lungs remain increased. The heart is normal in size. The pulmonary vascularity is not engorged. The dual-lumen right internal jugular venous catheter tip projects over the midportion of the SVC. IMPRESSION: Successful right sided thoracentesis. No more than a small amount of pleural fluid remains. No postprocedure  pneumothorax. Electronically Signed   By: David  Martinique M.D.   On: 04/23/2017 16:10   Dg Chest Port 1 View  Result Date: 04/25/2017 CLINICAL DATA:  Shortness of Breath EXAM: PORTABLE CHEST 1 VIEW COMPARISON:  April 24, 2017 FINDINGS: Central catheter tip is in the superior vena cava. No pneumothorax. There is diffuse interstitial edema. There is patchy airspace consolidation throughout the left mid lung and right mid lower lung zones with consolidation greatest in the right mid lower lung zone regions. There is a small right pleural effusion. There is a small calcified granuloma left base. Heart is upper normal in size with pulmonary venous hypertension. No adenopathy evident. No bone lesions. IMPRESSION: Pulmonary vascular congestion. Diffuse interstitial pulmonary edema with patchy alveolar pulmonary edema bilaterally. Superimposed pneumonia, particularly in the right mid to lower lung zone, cannot be excluded. Appearance similar to 1 day prior, although somewhat deeper degree of inspiration on current examination. No new opacity appreciable. Small granuloma left base, stable. Electronically Signed   By: Lowella Grip III M.D.   On: 04/25/2017 07:37   Dg Chest Port 1 View  Result Date: 04/24/2017 CLINICAL DATA:  Aspiration EXAM: PORTABLE CHEST 1 VIEW COMPARISON:  04/23/2017 FINDINGS: Right-sided central venous catheter tip overlies the distal SVC. Small bilateral effusions. Stable cardiomediastinal silhouette. Interval worsening of diffuse interstitial and alveolar opacity. No pneumothorax. IMPRESSION: 1. Small bilateral effusions 2. Interval worsening of diffuse interstitial and alveolar opacity consistent with worsening edema or diffuse  pneumonia Electronically Signed   By: Donavan Foil M.D.   On: 04/24/2017 02:02   Ir Thoracentesis Asp Pleural Space W/img Guide  Result Date: 04/23/2017 INDICATION: Shortness of breath, ARDS, COPD, healthcare associated pneumonia, current smoker, pleural  effusion. Request for diagnostic and therapeutic thoracentesis. EXAM: ULTRASOUND GUIDED RIGHT THORACENTESIS MEDICATIONS: 2% Lidocaine = 10 mL COMPLICATIONS: None immediate. PROCEDURE: An ultrasound guided thoracentesis was thoroughly discussed with the patient and questions answered. The benefits, risks, alternatives and complications were also discussed. The patient understands and wishes to proceed with the procedure. Written consent was obtained. Ultrasound was performed to localize and mark an adequate pocket of fluid in the right chest. The area was then prepped and draped in the normal sterile fashion. 1% Lidocaine was used for local anesthesia. Under ultrasound guidance a 6 Fr Safe-T-Centesis catheter was introduced. Thoracentesis was performed. The catheter was removed and a dressing applied. FINDINGS: A total of approximately 1.3 liters of clear yellow fluid was removed. Samples were sent to the laboratory as requested by the clinical team. IMPRESSION: Successful ultrasound guided right thoracentesis yielding 1.3 liters of pleural fluid. No pneumothorax on post procedure chest X-ray. Read by:  Gareth Eagle, PA-C Electronically Signed   By: Corrie Mckusick D.O.   On: 04/23/2017 16:37    Anti-infectives: Anti-infectives (From admission, onward)   Start     Dose/Rate Route Frequency Ordered Stop   04/24/17 1700  vancomycin (VANCOCIN) IVPB 1000 mg/200 mL premix  Status:  Discontinued     1,000 mg 200 mL/hr over 60 Minutes Intravenous  Once 04/24/17 1652 04/24/17 1656   04/24/17 1700  vancomycin (VANCOCIN) IVPB 1000 mg/200 mL premix     1,000 mg 200 mL/hr over 60 Minutes Intravenous Every 48 hours 04/24/17 1656     04/23/17 1500  piperacillin-tazobactam (ZOSYN) IVPB 3.375 g     3.375 g 12.5 mL/hr over 240 Minutes Intravenous Every 12 hours 04/23/17 1107     04/18/17 1800  piperacillin-tazobactam (ZOSYN) IVPB 3.375 g  Status:  Discontinued     3.375 g 12.5 mL/hr over 240 Minutes Intravenous Every 12  hours 04/18/17 1032 04/23/17 1107   04/18/17 1400  piperacillin-tazobactam (ZOSYN) IVPB 3.375 g  Status:  Discontinued     3.375 g 12.5 mL/hr over 240 Minutes Intravenous Every 8 hours 04/18/17 1031 04/18/17 1032   04/17/17 1200  piperacillin-tazobactam (ZOSYN) IVPB 3.375 g  Status:  Discontinued     3.375 g 100 mL/hr over 30 Minutes Intravenous Every 6 hours 04/17/17 1030 04/18/17 1031   04/17/17 1100  piperacillin-tazobactam (ZOSYN) IVPB 3.375 g  Status:  Discontinued     3.375 g 12.5 mL/hr over 240 Minutes Intravenous Every 8 hours 04/17/17 1019 04/17/17 1030   04/15/17 2125  Ampicillin-Sulbactam (UNASYN) 3 g in sodium chloride 0.9 % 100 mL IVPB  Status:  Discontinued     3 g 200 mL/hr over 30 Minutes Intravenous Every 8 hours 04/15/17 1529 04/17/17 1019   04/15/17 1100  ampicillin-sulbactam (UNASYN) 1.5 g in sodium chloride 0.9 % 50 mL IVPB  Status:  Discontinued     1.5 g 100 mL/hr over 30 Minutes Intravenous Every 12 hours 04/15/17 0937 04/15/17 1529   04/13/17 2200  levofloxacin (LEVAQUIN) IVPB 500 mg  Status:  Discontinued     500 mg 100 mL/hr over 60 Minutes Intravenous Every 48 hours 04/12/17 0114 04/12/17 0846   04/13/17 1800  piperacillin-tazobactam (ZOSYN) IVPB 2.25 g  Status:  Discontinued     2.25 g 100  mL/hr over 30 Minutes Intravenous Every 8 hours 04/13/17 1050 04/15/17 0937   04/12/17 0200  piperacillin-tazobactam (ZOSYN) IVPB 3.375 g  Status:  Discontinued     3.375 g 12.5 mL/hr over 240 Minutes Intravenous Every 8 hours 04/12/17 0103 04/13/17 1050   04/12/17 0000  levofloxacin (LEVAQUIN) IVPB 750 mg  Status:  Discontinued     750 mg 100 mL/hr over 90 Minutes Intravenous Every 48 hours 04/11/17 2230 04/12/17 0114      Assessment/Plan: Diverticulitis with perforation s/p IR drain - Repeat CT 04/19/17: no significant residual abscess around drain, 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 Stable small bowel dilation. - Afebrile, WBC has been trending down - slightly up today s/p procedure  yesterday. Follow.  - Drain2cc/24h, continue drain - Having bowel function(watery stool in rectal tube)and pain improving - Continue IV abx - continue therapies -Long term plan would be to control leak and keep on abx. Hopefully have definitive surgery in 6-8 weeks. Will need to increase PO intake/protein intake.   FEN: HH/carb mod; prealbumin 7.9 on 11/8 - increase ensure to TID. Encourage 5-6 small meals daily.  ID: Unasyn 10/31-11/2, Zosyn 10/28 >> (day #9) VTE: SCD's, SQ heparin      LOS: 14 days    Cearra Portnoy A. 04/25/2017

## 2017-04-25 NOTE — Progress Notes (Signed)
[facility] ROUNDING NOTE   Subjective:   Interval History: Patient continue to do well this morning. No acute complaints. Mildly delirious overnight per nursing, but appropriate with [place] this morning.  Objective:  Vital signs in last 24 hours:  Temp:  [97.7 F (36.5 C)-99.7 F (37.6 C)] 98.5 F (36.9 C) (11/10 0800) Pulse Rate:  [106-121] 121 (11/10 0800) Resp:  [18-29] 29 (11/10 0800) BP: (121-138)/(70-88) 134/81 (11/10 0800) SpO2:  [91 %-96 %] 96 % (11/10 1023) Weight:  [142 lb 8 oz (64.6 kg)] 142 lb 8 oz (64.6 kg) (11/10 0400)  Weight change: -6.9 oz (-2.462 kg) Filed Weights   04/23/17 0430 04/24/17 0416 04/25/17 0400  Weight: 141 lb 15.6 oz (64.4 kg) 147 lb 14.9 oz (67.1 kg) 142 lb 8 oz (64.6 kg)    Intake/Output: I/O last 3 completed shifts: In: 1320 [P.O.:840; I.V.:330; IV Piggyback:150] Out: 2300 [Urine:2075; Drains:25; Stool:200]    Intake/Output Summary (Last 24 hours) at 04/25/2017 1121 Last data filed at 04/25/2017 0818 Gross per 24 hour  Intake 940 ml  Output 1525 ml  Net -585 ml      Intake/Output this shift:  Total I/O In: -  Out: 250 [Urine:250]  Gen: no acute distress, in bed answering questions appropriatley and able to participate in exam. HEENT: R nontunneled HD catheter in place CVS: RRR, no murmur appreciated  Resp: breath sounds coarse rhonchi but improved ASN:[id number], hypoactive bowel sounds Ext: no peripheral edema, some dependent sacral edema    Basic Metabolic Panel: Recent Labs  Lab 04/19/17 0336  04/21/17 1133 04/22/17 1515 04/23/17 0307 04/24/17 0325 04/25/17 0258  NA 131*   < > 131* 131* 130* 131* 131*  K 3.3*   < > 3.3* 3.4* 3.5 3.5 3.5  CL 96*   < > 95* 96* 95* 95* 95*  CO2 26   < > 25 26 25 26 25   GLUCOSE 99   < > 109* 94 95 87 70  BUN 12   < > 21* 22* 22* 26* 28*  CREATININE 1.99*   < > 3.24* 3.31* 3.42* 3.15* 3.23*  CALCIUM 7.4*   < > 7.7* 7.7* 7.6* 7.7* 7.9*  MG 1.8  --   --  1.6* 1.6* 1.8 1.8   PHOS 4.1  --   --  4.4 4.4 4.2 4.5   < > = values in this interval not displayed.    Liver Function Tests: Recent Labs  Lab 04/22/17 1515 04/23/17 0307 04/24/17 0325 04/25/17 0258  AST 19 17 18 18   ALT 9* 10* 9* 9*  ALKPHOS 67 58 65 64  BILITOT 0.7 0.7 0.4 0.4  PROT 6.0* 5.5* 5.7* 6.5  ALBUMIN 1.8* 1.7* 1.7* 1.8*   No results for input(s): LIPASE, AMYLASE in the last 168 hours. Recent Labs  Lab 04/24/17 1609 04/25/17 0842  AMMONIA 54* 39*    CBC: Recent Labs  Lab 04/21/17 1133 04/22/17 1515 04/23/17 0307 04/24/17 0325 04/25/17 0258  WBC 17.4* 15.1* 16.6* 20.8* 19.7*  NEUTROABS  --  12.1* 13.2* 16.4* 14.9*  HGB 7.7* 7.5* 6.9* 8.7* 8.1*  HCT 23.3* 21.9* 20.0* 25.5* 24.2*  MCV 94.0 94.0 93.9 91.1 91.7  PLT 284 313 304 345 334    Cardiac Enzymes: No results for input(s): CKTOTAL, CKMB, CKMBINDEX, TROPONINI in the last 168 hours.  BNP: Invalid input(s): POCBNP  CBG: Recent Labs  Lab 04/24/17 1539 04/24/17 2013 04/25/17 0009 04/25/17 0413 04/25/17 0813  GLUCAP 96 133* 129* 100* 104*  Microbiology: Results for orders placed or performed during the hospital encounter of 04/11/17  MRSA PCR Screening     Status: None   Collection Time: 04/11/17  3:54 PM  Result Value Ref Range Status   MRSA by PCR NEGATIVE NEGATIVE Final    Comment:        The GeneXpert MRSA Assay (FDA approved for NASAL specimens only), is one component of a comprehensive MRSA colonization surveillance program. It is not intended to diagnose MRSA infection nor to guide or monitor treatment for MRSA infections.   Gram stain     Status: None   Collection Time: 04/23/17  3:54 PM  Result Value Ref Range Status   Specimen Description FLUID PLEURAL  Final   Special Requests NONE  Final   Gram Stain   Final    FEW WBC PRESENT,BOTH PMN AND MONONUCLEAR NO ORGANISMS SEEN    Report Status 04/23/2017 FINAL  Final  Culture, body fluid-bottle     Status: None (Preliminary result)    Collection Time: 04/23/17  3:54 PM  Result Value Ref Range Status   Specimen Description FLUID PLEURAL  Final   Special Requests NONE  Final   Culture NO GROWTH < 24 HOURS  Final   Report Status PENDING  Incomplete    Coagulation Studies: No results for input(s): LABPROT, INR in the last 72 hours.  Urinalysis: No results for input(s): COLORURINE, LABSPEC, PHURINE, GLUCOSEU, HGBUR, BILIRUBINUR, KETONESUR, PROTEINUR, UROBILINOGEN, NITRITE, LEUKOCYTESUR in the last 72 hours.  Invalid input(s): APPERANCEUR    Imaging: Dg Chest 1 View  Result Date: 04/23/2017 CLINICAL DATA:  Status post right thoracentesis. EXAM: CHEST 1 VIEW COMPARISON:  Portable chest x-ray of earlier today. FINDINGS: The volume of pleural fluid on the right has decreased. No postprocedure pneumothorax is observed. There is persistent right basilar atelectasis as well as small amount of pleural fluid. The interstitial markings of both lungs remain increased. The heart is normal in size. The pulmonary vascularity is not engorged. The dual-lumen right internal jugular venous catheter tip projects over the midportion of the SVC. IMPRESSION: Successful right sided thoracentesis. No more than a small amount of pleural fluid remains. No postprocedure pneumothorax. Electronically Signed   By: David  Martinique M.D.   On: 04/23/2017 16:10   Dg Chest Port 1 View  Result Date: 04/25/2017 CLINICAL DATA:  Shortness of Breath EXAM: PORTABLE CHEST 1 VIEW COMPARISON:  April 24, 2017 FINDINGS: Central catheter tip is in the superior vena cava. No pneumothorax. There is diffuse interstitial edema. There is patchy airspace consolidation throughout the left mid lung and right mid lower lung zones with consolidation greatest in the right mid lower lung zone regions. There is a small right pleural effusion. There is a small calcified granuloma left base. Heart is upper normal in size with pulmonary venous hypertension. No adenopathy evident. No bone  lesions. IMPRESSION: Pulmonary vascular congestion. Diffuse interstitial pulmonary edema with patchy alveolar pulmonary edema bilaterally. Superimposed pneumonia, particularly in the right mid to lower lung zone, cannot be excluded. Appearance similar to 1 day prior, although somewhat deeper degree of inspiration on current examination. No new opacity appreciable. Small granuloma left base, stable. Electronically Signed   By: Lowella Grip III M.D.   On: 04/25/2017 07:37   Dg Chest Port 1 View  Result Date: 04/24/2017 CLINICAL DATA:  Aspiration EXAM: PORTABLE CHEST 1 VIEW COMPARISON:  04/23/2017 FINDINGS: Right-sided central venous catheter tip overlies the distal SVC. Small bilateral effusions. Stable cardiomediastinal silhouette. Interval worsening  of diffuse interstitial and alveolar opacity. No pneumothorax. IMPRESSION: 1. Small bilateral effusions 2. Interval worsening of diffuse interstitial and alveolar opacity consistent with worsening edema or diffuse pneumonia Electronically Signed   By: Donavan Foil M.D.   On: 04/24/2017 02:02   Ir Thoracentesis Asp Pleural Space W/img Guide  Result Date: 04/23/2017 INDICATION: Shortness of breath, ARDS, COPD, healthcare associated pneumonia, current smoker, pleural effusion. Request for diagnostic and therapeutic thoracentesis. EXAM: ULTRASOUND GUIDED RIGHT THORACENTESIS MEDICATIONS: 2% Lidocaine = 10 mL COMPLICATIONS: None immediate. PROCEDURE: An ultrasound guided thoracentesis was thoroughly discussed with the patient and questions answered. The benefits, risks, alternatives and complications were also discussed. The patient understands and wishes to proceed with the procedure. Written consent was obtained. Ultrasound was performed to localize and mark an adequate pocket of fluid in the right chest. The area was then prepped and draped in the normal sterile fashion. 1% Lidocaine was used for local anesthesia. Under ultrasound guidance a 6 Fr  Safe-T-Centesis catheter was introduced. Thoracentesis was performed. The catheter was removed and a dressing applied. FINDINGS: A total of approximately 1.3 liters of clear yellow fluid was removed. Samples were sent to the laboratory as requested by the clinical team. IMPRESSION: Successful ultrasound guided right thoracentesis yielding 1.3 liters of pleural fluid. No pneumothorax on post procedure chest X-ray. Read by:  Gareth Eagle, PA-C Electronically Signed   By: Corrie Mckusick D.O.   On: 04/23/2017 16:37     Medications:   . sodium chloride    . sodium chloride 10 mL/hr at 04/19/17 1900  . sodium chloride    . sodium chloride    . dexmedetomidine (PRECEDEX) IV infusion Stopped (04/17/17 1700)  . piperacillin-tazobactam (ZOSYN)  IV Stopped (04/25/17 0802)  . vancomycin Stopped (04/24/17 2133)   . darbepoetin (ARANESP) injection - DIALYSIS  200 mcg Intravenous Q Mon-HD  . feeding supplement (ENSURE ENLIVE)  237 mL Oral QID  . folic acid  1 mg Oral QHS  . guaiFENesin  1,200 mg Oral BID  . heparin subcutaneous  5,000 Units Subcutaneous Q8H  . insulin aspart  0-9 Units Subcutaneous Q4H  . ipratropium-albuterol  3 mL Nebulization TID  . mouth rinse  15 mL Mouth Rinse BID  . multivitamin with minerals  1 tablet Oral Daily  . pantoprazole  40 mg Oral QHS  . sodium chloride flush  10-40 mL Intracatheter Q12H  . thiamine  100 mg Oral QHS  . traZODone  50 mg Oral QHS   sodium chloride, sodium chloride, sodium chloride, alteplase, fentaNYL (SUBLIMAZE) injection, heparin, heparin, heparin, ipratropium-albuterol, lidocaine (PF), lidocaine (PF), lidocaine-prilocaine, metoprolol tartrate, ondansetron (ZOFRAN) IV, pentafluoroprop-tetrafluoroeth, sodium chloride flush  Assessment/ Plan:   AKI, stable- Baseline Cr unknown.  Initially on CRRT,  tolerated IHD. Creatinine is stable at 3.23. Good urine output in the past 24 hrs. Will continue to monitor kidney function for recovery still no indication  for dialysis.  --Follow up on am BMP  Pleural effusion, stable   Patient has a significant smoking history 150 pack year history. Lung         exam is consistent with rhonchi throughout both lung field. Concern           for possible malignancy given history. Concern for pneumonia based CXR. Leukocytosis has increase and patient has significant left shift. Patient was started on Vancomycin yesterday. Would consider additional coverage for possible pneumonia ( 3rd gen cephalo). Pateint had increase oxygen requirement overnight  --Follow up on cytology from thoracentesis.  Hypokalemia, resolving  replete as needed.   Anemia of chronic disease - initiated po iron, Aranesp 200 mcg q Monday  VDRF - s/p extubation, resp status improving  Diverticular abscess s/p drain placed by IR, on Zosyn, repeat imaging per primary. Seen by surgery with recommendations for medical management and possible surgery in 6-8 weeks.  Hypophosphatemia, Resolved    LOS: [place], MD Export, PGY-2  @TODAY @11 :21 AM

## 2017-04-25 NOTE — Evaluation (Signed)
Clinical/Bedside Swallow Evaluation Patient Details  Name: Dominic Phillips MRN: [id number] Date of Birth: 10-06-1960  Today's Date: 04/25/2017 Time: SLP Start Time (ACUTE ONLY): 0809 SLP Stop Time (ACUTE ONLY): 0825 SLP Time Calculation (min) (ACUTE ONLY): 16 min  Past Medical History: History reviewed. No pertinent past medical history. Past Surgical History:  Past Surgical History:  Procedure Laterality Date  . IR THORACENTESIS ASP PLEURAL SPACE W/IMG GUIDE  04/23/2017   HPI:  Dominic Phillips a 56 y.o.male with etoh abuse history in withdrawal with a liver abscess and SBO on TPN. Transferred from [facility] for management of ARDS and progressive renal insufficiency. An abd CT revealed pericolonic inflammation/fluid collection, for which he was admitted for diverticular abscess. Hospital course was complicated by the development of DTs, he slowly improved but worsened on 10/23 with increased WBC and progressive hypoxemia. Repeat abdominal CT showed improving abscess but developing SBO and multilobar pneumonia. Intubated on 10/25-11/2 for ARDS and aspiration pneumonia; CT chest/abdomen/pelvis on 10/26 showed bilateral pneumonia with volume overload. Pleural effusion also worsened so patient underwent Thoracentesis11/8. Per MD notes, pt had a coughing episode overnight and vomited and aspirated.   Assessment / Plan / Recommendation Clinical Impression  Patient presents with oropharyngeal swallow appearing WFL with what appears to be adequate airway protection at bedside. No overt signs of aspiration noted despite repeated challenging with consecutive straw sips of thin liquids. Swallow appears timely, vitals stable and voice clear. Pt is at mild risk given history of ETOH abuse, as well as 8 day intubation (extubated over 8 days ago). Phonation and cough are strong, no hoarseness noted. Pt does state he is only able to eat small amounts, feels sick if he eats or drinks larger volumes. He did  belch frequently during the exam, suggestive of possible esophageal dysphagia. Recommend he continue current diet, no further skilled ST needs identified. May benefit from GI consult. SLP Visit Diagnosis: Dysphagia, unspecified (R13.10)    Aspiration Risk  Mild aspiration risk    Diet Recommendation Thin liquid;Regular   Liquid Administration via: Cup;Straw Medication Administration: Whole meds with liquid Supervision: Patient able to self feed Compensations: Slow rate;Small sips/bites Postural Changes: Seated upright at 90 degrees    Other  Recommendations Recommended Consults: Consider GI evaluation Oral Care Recommendations: Oral care BID   Follow up Recommendations None      Frequency and Duration            Prognosis        Swallow Study   General Date of Onset: 04/07/17 HPI: Dominic Phillips a 56 y.o.male with etoh abuse history in withdrawal with a liver abscess and SBO on TPN. Transferred from [facility] for management of ARDS and progressive renal insufficiency. An abd CT revealed pericolonic inflammation/fluid collection, for which he was admitted for diverticular abscess. Hospital course was complicated by the development of DTs, he slowly improved but worsened on 10/23 with increased WBC and progressive hypoxemia. Repeat abdominal CT showed improving abscess but developing SBO and multilobar pneumonia. Intubated on 10/25-11/2 for ARDS and aspiration pneumonia; CT chest/abdomen/pelvis on 10/26 showed bilateral pneumonia with volume overload. Pleural effusion also worsened so patient underwent Thoracentesis11/8. Per MD notes, pt had a coughing episode overnight and vomited and aspirated. Type of Study: Bedside Swallow Evaluation Previous Swallow Assessment: none on file Diet Prior to this Study: Regular;Thin liquids Temperature Spikes Noted: No Respiratory Status: Nasal cannula History of Recent Intubation: Yes Length of Intubations (days): 8 days Date  extubated: 04/17/17 Behavior/Cognition: Alert;Cooperative Oral  Cavity Assessment: Within Functional Limits Oral Care Completed by SLP: No Oral Cavity - Dentition: Edentulous Vision: Functional for self-feeding Self-Feeding Abilities: Able to feed self Patient Positioning: Upright in bed Baseline Vocal Quality: Normal Volitional Cough: Strong;Congested Volitional Swallow: Able to elicit    Oral/Motor/Sensory Function Overall Oral Motor/Sensory Function: Within functional limits   Ice Chips Ice chips: Within functional limits   Thin Liquid Thin Liquid: Within functional limits Presentation: Cup;Straw;Self Fed    Nectar Thick Nectar Thick Liquid: Not tested   Honey Thick Honey Thick Liquid: Not tested   Puree Puree: Within functional limits Presentation: Spoon;Self Fed   Solid   GO   Solid: Within functional limits Presentation: [place], [place], [employer] Speech-Language Pathologist [place] 04/25/2017,8:38 AM

## 2017-04-26 ENCOUNTER — Inpatient Hospital Stay (HOSPITAL_COMMUNITY): Payer: Medicaid Other

## 2017-04-26 LAB — CBC WITH DIFFERENTIAL/PLATELET
BASOS PCT: 0 %
Basophils Absolute: 0 10*3/uL (ref 0.0–0.1)
EOS PCT: 2 %
Eosinophils Absolute: 0.4 10*3/uL (ref 0.0–0.7)
HEMATOCRIT: 22.3 % — AB (ref 39.0–52.0)
Hemoglobin: 7.6 g/dL — ABNORMAL LOW (ref 13.0–17.0)
LYMPHS ABS: 1.8 10*3/uL (ref 0.7–4.0)
Lymphocytes Relative: 10 %
MCH: 31.4 pg (ref 26.0–34.0)
MCHC: 34.1 g/dL (ref 30.0–36.0)
MCV: 92.1 fL (ref 78.0–100.0)
MONO ABS: 2 10*3/uL — AB (ref 0.1–1.0)
MONOS PCT: 11 %
NEUTROS ABS: 14.2 10*3/uL — AB (ref 1.7–7.7)
Neutrophils Relative %: 77 %
PLATELETS: 336 10*3/uL (ref 150–400)
RBC: 2.42 MIL/uL — ABNORMAL LOW (ref 4.22–5.81)
RDW: 17.7 % — AB (ref 11.5–15.5)
WBC: 18.4 10*3/uL — AB (ref 4.0–10.5)

## 2017-04-26 LAB — COMPREHENSIVE METABOLIC PANEL
ALT: 8 U/L — ABNORMAL LOW (ref 17–63)
AST: 18 U/L (ref 15–41)
Albumin: 1.7 g/dL — ABNORMAL LOW (ref 3.5–5.0)
Alkaline Phosphatase: 60 U/L (ref 38–126)
Anion gap: 13 (ref 5–15)
BILIRUBIN TOTAL: 0.6 mg/dL (ref 0.3–1.2)
BUN: 30 mg/dL — AB (ref 6–20)
CHLORIDE: 93 mmol/L — AB (ref 101–111)
CO2: 25 mmol/L (ref 22–32)
Calcium: 7.9 mg/dL — ABNORMAL LOW (ref 8.9–10.3)
Creatinine, Ser: 3.05 mg/dL — ABNORMAL HIGH (ref 0.61–1.24)
GFR, EST AFRICAN AMERICAN: 25 mL/min — AB (ref >60)
GFR, EST NON AFRICAN AMERICAN: 22 mL/min — AB (ref >60)
Glucose, Bld: 108 mg/dL — ABNORMAL HIGH (ref 65–99)
Potassium: 3 mmol/L — ABNORMAL LOW (ref 3.5–5.1)
Sodium: 131 mmol/L — ABNORMAL LOW (ref 135–145)
TOTAL PROTEIN: 6.3 g/dL — AB (ref 6.5–8.1)

## 2017-04-26 LAB — GLUCOSE, CAPILLARY
GLUCOSE-CAPILLARY: 115 mg/dL — AB (ref 65–99)
GLUCOSE-CAPILLARY: 101 mg/dL — AB (ref 65–99)
GLUCOSE-CAPILLARY: 103 mg/dL — AB (ref 65–99)
Glucose-Capillary: 114 mg/dL — ABNORMAL HIGH (ref 65–99)
Glucose-Capillary: 93 mg/dL (ref 65–99)
Glucose-Capillary: 89 mg/dL (ref 65–99)

## 2017-04-26 LAB — BLOOD GAS, ARTERIAL
Acid-base deficit: 0.7 mmol/L (ref 0.0–2.0)
Bicarbonate: 23.9 mmol/L (ref 20.0–28.0)
Drawn by: 244801
FIO2: 100
O2 SAT: 98.4 %
PCO2 ART: 42.5 mmHg (ref 32.0–48.0)
PH ART: 7.368 (ref 7.350–7.450)
Patient temperature: 98.6
pO2, Arterial: 114 mmHg — ABNORMAL HIGH (ref 83.0–108.0)

## 2017-04-26 LAB — PHOSPHORUS: PHOSPHORUS: 4.6 mg/dL (ref 2.5–4.6)

## 2017-04-26 LAB — MAGNESIUM: MAGNESIUM: 1.7 mg/dL (ref 1.7–2.4)

## 2017-04-26 LAB — VANCOMYCIN, RANDOM: VANCOMYCIN RM: 10

## 2017-04-26 MED ORDER — VANCOMYCIN HCL 10 G IV SOLR
1250.0000 mg | INTRAVENOUS | Status: DC
  Administered 2017-04-26: 1250 mg via INTRAVENOUS
  Filled 2017-04-26 (×2): qty 1250

## 2017-04-26 MED ORDER — FUROSEMIDE 10 MG/ML IJ SOLN
60.0000 mg | Freq: Once | INTRAMUSCULAR | Status: AC
  Administered 2017-04-26: 60 mg via INTRAVENOUS
  Filled 2017-04-26: qty 6

## 2017-04-26 MED ORDER — FUROSEMIDE 10 MG/ML IJ SOLN
120.0000 mg | Freq: Once | INTRAVENOUS | Status: AC
  Administered 2017-04-26: 120 mg via INTRAVENOUS
  Filled 2017-04-26: qty 12

## 2017-04-26 MED ORDER — POTASSIUM CHLORIDE CRYS ER 20 MEQ PO TBCR: 40.0000 meq | EXTENDED_RELEASE_TABLET | Freq: Two times a day (BID) | ORAL | Status: DC

## 2017-04-26 MED ORDER — POTASSIUM CHLORIDE CRYS ER 20 MEQ PO TBCR
40.0000 meq | EXTENDED_RELEASE_TABLET | Freq: Three times a day (TID) | ORAL | Status: DC
  Administered 2017-04-26 – 2017-04-30 (×11): 40 meq via ORAL
  Filled 2017-04-26 (×11): qty 2

## 2017-04-26 NOTE — Progress Notes (Signed)
PROGRESS NOTE    Dominic Phillips  ZOX:[id number] DOB: 02-Jul-1960 DOA: 04/11/2017 PCP: No primary care provider on file.   Brief Narrative:  Dominic Phillips is a 56 y.o. 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 Transferred from [facility] for management of ARDS and progressive renal insufficiency. This gentleman has a long history of heavy ETOH abuse (~ 18 beers per day) and presented to [place] on 04/02/2017 with a c/o of abdominal pain without fevers, leukocytosis or diarrhea. An abd CT revealed pericolonic inflammation/fluid collection, for which he was admitted for diverticular abscess. Was consulted by Gen Surg who suggested placement of a catheter by interventional radiology; subsequent cultures grew E. Coli, for which Zosyn was initiated. Hospital course was complicated by the development of DTs, treated with 1 beer tid and Ativan prn. He slowly improved but worsened on 10/23 with increased WBC and progressive hypoxemia. Repeat abdominal CT showed improving abscess but developing SBO and multilobar pneumonia. Hypoxemia could not be corrected with BiPAP, along with worsening renal function such that he was intubated on 10/25 and required pressors for hemodynamic support. CT chest/abdomen/pelvis on 10/26 showed bilateral pneumonia with volume overload; no change in the RLQ percutaneous drain catheter. With rising creat to 3.1 and 290 cc U/O over 24 hours, transfer was requested.   He required intubation from 10/27 to 11/2 for ARDS and aspiration pneumonia and pressors for circulatory shock in ICU. He was receiving CRRT for AKI and has tolerated IHD x1. Worsening leukocytosis possibly secondary abdominal abscess. Pleural effusion also worsened so patient underwent Thoracentesis 11/8. Blood Count dropped so he was given 1 unit of pRBC's. Nephrology giving 120 mg of IV Lasix yesterday.   On the night of 11/08-11/09 patient had a coughing episode and vomited and  aspirated. He was a little confused. Throughout the course of the day he remained slightly confused so an ABG was obtained and he was Hypoxemic so his O2 was increased to 15 Liters and repeat ABG ordered and was normal. Continue to Wean O2. Abx broadened and Vanc added. Patient had breathing treatments added today and was going try to be weaned down on his O2.  Today the patient desaturated to the 80's and was placed on 15 Liters of HFNC with no real improvement and was eventually switched to NRB and then BiPAP with improvement. Patient was given 120 mg of IV Lasix as he sounded wet. Patient remains intermittently confused and likely from Hypoxemia. If does not improve may need to re-consult PCCM.   Assessment & Plan:   Active Problems:   Acute respiratory distress syndrome (ARDS) (HCC)   Acute kidney injury ([place])   Pressure injury of skin   Colonic diverticular abscess   COPD (chronic obstructive pulmonary disease) (HCC)   Dyspnea   HCAP (healthcare-associated pneumonia)   Acute blood loss anemia   Anemia of chronic disease   ETOH abuse   Tobacco abuse   Tachycardia   Tachypnea   Leukocytosis   Post-operative pain  Acute respiratory distress syndrome/VDRF s/p Extubation 11/2 now requiring NIPPV with BiPAP -Seen on chest x-ray but possibly secondary to volume overload in setting of acute kidney injury Patient required intubation from 10/27 to 11/2.  -C/w Supplemental O2 via [place] and had to increase to 15 Liters HFNC and then place on BiPAP eventually -ABG done today showed 7.368/42.5/114/23.9/98.4% on 100% NRB -Ordered Thoracentesis and was done and drained 1.3 Liters -Repeat CXR yesterday 11/9 showed Small bilateral effusions  Interval worsening of diffuse interstitial and alveolar opacity consistent with worsening edema or diffuse pneumonia. -CXR 11/10 AM showed Pulmonary vascular congestion. Diffuse interstitial pulmonary edema with patchy alveolar pulmonary edema bilaterally.  Superimposed pneumonia, particularly in the right mid to lower lung zone, cannot be excluded. Appearance similar to 1 day prior, although somewhat deeper degree of inspiration on current examination. No new opacity appreciable. Small granuloma left base, stable  -CXR 11/11 AM showed Pulmonary vascular congestion with extensive pulmonary edema and small pleural effusions. Consolidation on the right is concerning for superimposed pneumonia in the lower lung zone region. The extent of the edema overall is similar 1 day prior. -C/w IV Vancomycin and IV Zosyn  -Given 60 of IV Lasix yesterday. Will give IV 120 this AM and 60 IV this evening.  -Repeat CXR in AM   Bilateral pleural effusions with Right worse than Left, improved -Patient still on oxygen. No respiratory distress -Nephrology recommendations appreciated -Thoracentesis done and drained 1.3 Liters -Follow up on Fluid Studies  -Repeat CXR showed successful Right Sided Thoracentesis. No more than a small amount of Pleural Fluid Remains. No post-procedure Pneumothorax -CXR today showed Pulmonary Vascular Congestion and Diffuse interstitial Pulmonary Edema with patchy alveolar edema bilaterally. Superimposed PNA in the Right to Mid to Lower Lung Zone could not be excluded.  -Try 120 mg of IV Lasix this AM and 60 mg of IV Lasix this Evening    Aspiration Pneumonia / HCAP -Treated with Zosyn. Improved. Course completed. Mild sputum production which has improved. Chest x-ray with ?progression. -Likely more secondary to effusion. -Aspirated again yesterday so added IV Vanc to broaden Coverage. Continue IV Zosyn that was  -Add Incentive Spirometry and Flutter Valve -Added DuoNeb Scheduled and Mucinex -Repeat SLP ok and showed that patient is mild Aspiration risk  -WBC now 18.4  Diverticular Abscess -S/p drain. On zosyn per ID. Leukocytosis worsened yesterday but slightly improved. CT scan shows minimally improved abscess that is not communicating  with drain. -General surgery recommendations appreciated; Plan for now is continue Drain and Abx for perforation and long term plan is to control leak and keep on Abx and have definitive Surgery in 6-8 weeks -Pert General Surgery will need to increase po intake/protein intake so they are checking Pre-Albumin. Will consult Nutritionist  -Pre-Albuminm Low so Nutrition consulted and recommending changing Ensure to QID  -Continue IV Zosyn; Added IV Vancomycin  -WBC now 18.4  Abdominal wound drainage -Per general surgery recommendations as above  Leukocytosis -Possibly secondary to abdominal abscess and likley PNA. Afebrile. -trend CBC -management per above; WBC went from 17.4 -> 15.1 -> 16.6 -> 20.8 -> 19.7 -> 18.4  Acute Kidney Injury, -Patient initially with oliguria requiring CRRT. Had IHD on 11/3 without issue -nephrology recommendations: IHD -BUN/Cr went from 21/3.24 -> 22/3.31 -> 22/3.42 -> 26/3.15 -> 28/3.23 -> 30/3.05 -Nephrology giving 120 mg of IV Lasix on 11/8.  -Will try IV 120 mg Lasix today  And 60 mg IV Lasix given CXR findings and Hypoxia -Continue to Monitor and no dialysis indicated today and Nephrology states foley can be removed  -Temp HD Cath removed   COPD -Patient states he has quit smoking. Mild cough -continue Duoneb Scheduled and PRN -Added Mucinex -As Above  Circulatory shock -Patient required Levophed and was weaned off on 04/17/17. -Normotensive. -Continue to Monitor Hemodynamic Status   Ethanol Abuse -Out of window for Withdrawals.  Hypokalemia -Supplement with oral potassium 40 mEQ TID -K+ now improved to 3.0 -Continue to Monitor  and Replete As Necessary -Repeat CMP in AM  Insomnia -Continue Trazodone -Discontinue Ambien  Normocytic Anemia  -Hb/Hct trended down from 7.7/23.3 -> 7.5/21.9 -> 6.9/20.0 -Transfuse 1 unit of pRBC's and then blood count improved to 8.7/25.5 and now trending back down as went from 8.1/24.2 ->  7.6/22.3 -Continue to Monitor for S/Sx of bleeding -Repeat CBC in AM   Hypomagnesemia -Patient's Mag Level was 1.6 yesterday AM and improved to 1.7 -Continue to Monitor and Replete as Necessary -Repeat Mag Level in AM   Hyperammonemia -Ammonia Level slightly High at 54, Repeat was 39 -Repeat Ammonia Level in AM and if higher or worse add Laculose   DVT prophylaxis: Heparin 5,000 units sq q8h Code Status: Partial Code, DNI Family Communication: No family present at bedside Disposition Plan: Remain Inpatient as he continues to be treated medically  Consultants:   General Surgery  PCCM  Nephrology  CIR   Procedures:  CRRT IHD   Antimicrobials: Anti-infectives (From admission, onward)   Start     Dose/Rate Route Frequency Ordered Stop   04/26/17 1830  vancomycin (VANCOCIN) 1,250 mg in sodium chloride 0.9 % 250 mL IVPB     1,250 mg 166.7 mL/hr over 90 Minutes Intravenous Every 48 hours 04/26/17 1739     04/24/17 1700  vancomycin (VANCOCIN) IVPB 1000 mg/200 mL premix  Status:  Discontinued     1,000 mg 200 mL/hr over 60 Minutes Intravenous  Once 04/24/17 1652 04/24/17 1656   04/24/17 1700  vancomycin (VANCOCIN) IVPB 1000 mg/200 mL premix  Status:  Discontinued     1,000 mg 200 mL/hr over 60 Minutes Intravenous Every 48 hours 04/24/17 1656 04/26/17 1739   04/23/17 1500  piperacillin-tazobactam (ZOSYN) IVPB 3.375 g     3.375 g 12.5 mL/hr over 240 Minutes Intravenous Every 12 hours 04/23/17 1107     04/18/17 1800  piperacillin-tazobactam (ZOSYN) IVPB 3.375 g  Status:  Discontinued     3.375 g 12.5 mL/hr over 240 Minutes Intravenous Every 12 hours 04/18/17 1032 04/23/17 1107   04/18/17 1400  piperacillin-tazobactam (ZOSYN) IVPB 3.375 g  Status:  Discontinued     3.375 g 12.5 mL/hr over 240 Minutes Intravenous Every 8 hours 04/18/17 1031 04/18/17 1032   04/17/17 1200  piperacillin-tazobactam (ZOSYN) IVPB 3.375 g  Status:  Discontinued     3.375 g 100 mL/hr over 30  Minutes Intravenous Every 6 hours 04/17/17 1030 04/18/17 1031   04/17/17 1100  piperacillin-tazobactam (ZOSYN) IVPB 3.375 g  Status:  Discontinued     3.375 g 12.5 mL/hr over 240 Minutes Intravenous Every 8 hours 04/17/17 1019 04/17/17 1030   04/15/17 2125  Ampicillin-Sulbactam (UNASYN) 3 g in sodium chloride 0.9 % 100 mL IVPB  Status:  Discontinued     3 g 200 mL/hr over 30 Minutes Intravenous Every 8 hours 04/15/17 1529 04/17/17 1019   04/15/17 1100  ampicillin-sulbactam (UNASYN) 1.5 g in sodium chloride 0.9 % 50 mL IVPB  Status:  Discontinued     1.5 g 100 mL/hr over 30 Minutes Intravenous Every 12 hours 04/15/17 0937 04/15/17 1529   04/13/17 2200  levofloxacin (LEVAQUIN) IVPB 500 mg  Status:  Discontinued     500 mg 100 mL/hr over 60 Minutes Intravenous Every 48 hours 04/12/17 0114 04/12/17 0846   04/13/17 1800  piperacillin-tazobactam (ZOSYN) IVPB 2.25 g  Status:  Discontinued     2.25 g 100 mL/hr over 30 Minutes Intravenous Every 8 hours 04/13/17 1050 04/15/17 0937   04/12/17 0200  piperacillin-tazobactam (  ZOSYN) IVPB 3.375 g  Status:  Discontinued     3.375 g 12.5 mL/hr over 240 Minutes Intravenous Every 8 hours 04/12/17 0103 04/13/17 1050   04/12/17 0000  levofloxacin (LEVAQUIN) IVPB 750 mg  Status:  Discontinued     750 mg 100 mL/hr over 90 Minutes Intravenous Every 48 hours 04/11/17 2230 04/12/17 0114     Subjective: Seen and examined and was doing ok but remained confused and kept removing O2. Had to have mittens placed. Desaturated and had to be placed on BiPAP. Improved afterwards but still remained confused. No CP. No other concerns or complaints.    Objective: Vitals:   04/26/17 1202 04/26/17 1320 04/26/17 1545 04/26/17 1619  BP: 128/90  135/88 (!) 140/95  Pulse: (!) 108  (!) 107 (!) 111  Resp: (!) 25  20 (!) 26  Temp: 98 F (36.7 C)  98.4 F (36.9 C)   TempSrc:   Axillary   SpO2: 97% 99% 98% 100%  Weight:      Height:        Intake/Output Summary (Last 24  hours) at 04/26/2017 1925 Last data filed at 04/26/2017 1548 Gross per 24 hour  Intake 980 ml  Output 1490 ml  Net -510 ml   Filed Weights   04/24/17 0416 04/25/17 0400 04/26/17 0400  Weight: 67.1 kg (147 lb 14.9 oz) 64.6 kg (142 lb 8 oz) 63.7 kg (140 lb 8 oz)   Examination: Physical Exam:  Constitutional: Thin Caucasian male who is confused and keeps desaturating. Becomes agitated and removes his Supplemental O2.  ENMT: External ears and nose appear normal. MMM Neck: Supple Right IJ Temp Cath removed Respiratory: Severely diminished with rhonchi and diffuse crackles. Tachypenic but not using any accessory muscles to breathe.  Cardiovascular: Tachycardic Rate but regular rhtyhm. No appreciable m/r/g. Mild Extremity edema Abdomen: Soft, Tender to palapte. Slightly distended. Has RLQ drain in place GU: Deferred. Wearing Condom Cath and has Dignisheild in place Musculoskeletal: No contractures; No cyanosis Skin: Warm and Dry. No rashes or lesions appreciated on a limited skin eval Neurologic: CN 2-12 grossly intact. No appreciable focal deficits Psychiatric: Impaired judgement and insight. Slightly agitated mood and affect. Confused  Data Reviewed: I have personally reviewed following labs and imaging studies  CBC: Recent Labs  Lab 04/22/17 1515 04/23/17 0307 04/24/17 0325 04/25/17 0258 04/26/17 0248  WBC 15.1* 16.6* 20.8* 19.7* 18.4*  NEUTROABS 12.1* 13.2* 16.4* 14.9* 14.2*  HGB 7.5* 6.9* 8.7* 8.1* 7.6*  HCT 21.9* 20.0* 25.5* 24.2* 22.3*  MCV 94.0 93.9 91.1 91.7 92.1  PLT 313 304 345 334 948   Basic Metabolic Panel: Recent Labs  Lab 04/22/17 1515 04/23/17 0307 04/24/17 0325 04/25/17 0258 04/26/17 0248  NA 131* 130* 131* 131* 131*  K 3.4* 3.5 3.5 3.5 3.0*  CL 96* 95* 95* 95* 93*  CO2 26 25 26 25 25   GLUCOSE 94 95 87 70 108*  BUN 22* 22* 26* 28* 30*  CREATININE 3.31* 3.42* 3.15* 3.23* 3.05*  CALCIUM 7.7* 7.6* 7.7* 7.9* 7.9*  MG 1.6* 1.6* 1.8 1.8 1.7  PHOS 4.4  4.4 4.2 4.5 4.6   GFR: Estimated Creatinine Clearance: 24.9 mL/min (A) (by C-G formula based on SCr of 3.05 mg/dL (H)). Liver Function Tests: Recent Labs  Lab 04/22/17 1515 04/23/17 0307 04/24/17 0325 04/25/17 0258 04/26/17 0248  AST 19 17 18 18 18   ALT 9* 10* 9* 9* 8*  ALKPHOS 67 58 65 64 60  BILITOT 0.7 0.7 0.4 0.4 0.6  PROT 6.0* 5.5* 5.7* 6.5 6.3*  ALBUMIN 1.8* 1.7* 1.7* 1.8* 1.7*   No results for input(s): LIPASE, AMYLASE in the last 168 hours. Recent Labs  Lab 04/24/17 1609 04/25/17 0842  AMMONIA 54* 39*   Coagulation Profile: No results for input(s): INR, PROTIME in the last 168 hours. Cardiac Enzymes: No results for input(s): CKTOTAL, CKMB, CKMBINDEX, TROPONINI in the last 168 hours. BNP (last 3 results) No results for input(s): PROBNP in the last 8760 hours. HbA1C: No results for input(s): HGBA1C in the last 72 hours. CBG: Recent Labs  Lab 04/25/17 2316 04/26/17 0346 04/26/17 0758 04/26/17 1222 04/26/17 1547  GLUCAP 120* 115* 101* 114* 93   Lipid Profile: No results for input(s): CHOL, HDL, LDLCALC, TRIG, CHOLHDL, LDLDIRECT in the last 72 hours. Thyroid Function Tests: No results for input(s): TSH, T4TOTAL, FREET4, T3FREE, THYROIDAB in the last 72 hours. Anemia Panel: No results for input(s): VITAMINB12, FOLATE, FERRITIN, TIBC, IRON, RETICCTPCT in the last 72 hours. Sepsis Labs: No results for input(s): PROCALCITON, LATICACIDVEN in the last 168 hours.  Recent Results (from the past 240 hour(s))  Gram stain     Status: None   Collection Time: 04/23/17  3:54 PM  Result Value Ref Range Status   Specimen Description FLUID PLEURAL  Final   Special Requests NONE  Final   Gram Stain   Final    FEW WBC PRESENT,BOTH PMN AND MONONUCLEAR NO ORGANISMS SEEN    Report Status 04/23/2017 FINAL  Final  Culture, body fluid-bottle     Status: None (Preliminary result)   Collection Time: 04/23/17  3:54 PM  Result Value Ref Range Status   Specimen Description  FLUID PLEURAL  Final   Special Requests NONE  Final   Culture NO GROWTH 3 DAYS  Final   Report Status PENDING  Incomplete     Radiology Studies: Dg Chest Port 1 View  Result Date: 04/26/2017 CLINICAL DATA:  Shortness of Breath EXAM: PORTABLE CHEST 1 VIEW COMPARISON:  April 25, 2017 and April 23, 2017 FINDINGS: Central catheter is been removed. No pneumothorax. There is widespread interstitial pulmonary edema. Patchy airspace opacity is noted bilaterally with somewhat increased consolidation medially in the right mid and lower lung zones. Patchy alveolar opacity is seen throughout the perihilar regions bilaterally, slightly more severe on the left than on the right. There are small pleural effusions bilaterally. Heart is upper normal in size with pulmonary venous hypertension. No evident adenopathy. No bone lesions. Small calcified granuloma again noted at left base. IMPRESSION: Pulmonary vascular congestion with extensive pulmonary edema and small pleural effusions. Consolidation on the right is concerning for superimposed pneumonia in the lower lung zone region. The extent of the edema overall is similar 1 day prior. Electronically Signed   By: Lowella Grip III M.D.   On: 04/26/2017 07:10   Dg Chest Port 1 View  Result Date: 04/25/2017 CLINICAL DATA:  Shortness of Breath EXAM: PORTABLE CHEST 1 VIEW COMPARISON:  April 24, 2017 FINDINGS: Central catheter tip is in the superior vena cava. No pneumothorax. There is diffuse interstitial edema. There is patchy airspace consolidation throughout the left mid lung and right mid lower lung zones with consolidation greatest in the right mid lower lung zone regions. There is a small right pleural effusion. There is a small calcified granuloma left base. Heart is upper normal in size with pulmonary venous hypertension. No adenopathy evident. No bone lesions. IMPRESSION: Pulmonary vascular congestion. Diffuse interstitial pulmonary edema with patchy  alveolar pulmonary edema  bilaterally. Superimposed pneumonia, particularly in the right mid to lower lung zone, cannot be excluded. Appearance similar to 1 day prior, although somewhat deeper degree of inspiration on current examination. No new opacity appreciable. Small granuloma left base, stable. Electronically Signed   By: Lowella Grip III M.D.   On: 04/25/2017 07:37   Scheduled Meds: . darbepoetin (ARANESP) injection - DIALYSIS  200 mcg Intravenous Q Mon-HD  . feeding supplement (ENSURE ENLIVE)  237 mL Oral QID  . folic acid  1 mg Oral QHS  . guaiFENesin  1,200 mg Oral BID  . heparin subcutaneous  5,000 Units Subcutaneous Q8H  . insulin aspart  0-9 Units Subcutaneous Q4H  . ipratropium-albuterol  3 mL Nebulization TID  . mouth rinse  15 mL Mouth Rinse BID  . multivitamin with minerals  1 tablet Oral Daily  . pantoprazole  40 mg Oral QHS  . potassium chloride  40 mEq Oral BID  . sodium chloride flush  10-40 mL Intracatheter Q12H  . thiamine  100 mg Oral QHS  . traZODone  50 mg Oral QHS   Continuous Infusions: . sodium chloride    . sodium chloride 10 mL/hr at 04/25/17 1900  . sodium chloride    . sodium chloride    . dexmedetomidine (PRECEDEX) IV infusion Stopped (04/17/17 1700)  . piperacillin-tazobactam (ZOSYN)  IV Stopped (04/26/17 1815)  . vancomycin 1,250 mg (04/26/17 1840)     LOS: 15 days   Kerney Elbe, DO [facility] Pager [phone]  If 7PM-7AM, please contact night-coverage [email or web address] Password [email or web address] 04/26/2017, 7:25 PM

## 2017-04-26 NOTE — Progress Notes (Signed)
Progress Note: General Surgery Service   Assessment/Plan: Patient Active Problem List   Diagnosis Date Noted  . Dyspnea   . HCAP (healthcare-associated pneumonia)   . Acute blood loss anemia   . Anemia of chronic disease   . ETOH abuse   . Tobacco abuse   . Tachycardia   . Tachypnea   . Leukocytosis   . Post-operative pain   . Pressure injury of skin 04/19/2017  . Colonic diverticular abscess 04/19/2017  . COPD (chronic obstructive pulmonary disease) ([place]) 04/19/2017  . Acute kidney injury ([place])   . Acute respiratory distress syndrome (ARDS) ([facility]) 04/11/2017   Desaturation today switched to bipap -continue drain -continue abx   LOS: 15 days  Chief Complaint/Subjective: Some abdominal discomfort, desaturation today requiring bipap  Objective: Vital signs in last 24 hours: Temp:  [97.5 F (36.4 C)-98.8 F (37.1 C)] 98.8 F (37.1 C) (11/11 0803) Pulse Rate:  [112-122] 122 (11/11 0940) Resp:  [17-32] 25 (11/11 0940) BP: (117-137)/(76-94) 137/94 (11/11 0940) SpO2:  [90 %-100 %] 100 % (11/11 0940) Weight:  [63.7 kg (140 lb 8 oz)] 63.7 kg (140 lb 8 oz) (11/11 0400) Last BM Date: 04/25/17  Intake/Output from previous day: 11/10 0701 - 11/11 0700 In: 850 [P.O.:240; I.V.:110; IV Piggyback:250] Out: 1115 [Urine:1100; Drains:15] Intake/Output this shift: Total I/O In: 260 [P.O.:260] Out: 175 [Urine:175] Abd: moderate distension, some RUQ pain, drain with small amount of brown liqui  Extremities: no edema  Neuro: AOx4  Lab Results: CBC  Recent Labs    04/25/17 0258 04/26/17 0248  WBC 19.7* 18.4*  HGB 8.1* 7.6*  HCT 24.2* 22.3*  PLT 334 336   BMET Recent Labs    04/25/17 0258 04/26/17 0248  NA 131* 131*  K 3.5 3.0*  CL 95* 93*  CO2 25 25  GLUCOSE 70 108*  BUN 28* 30*  CREATININE 3.23* 3.05*  CALCIUM 7.9* 7.9*   PT/INR No results for input(s): LABPROT, INR in the last 72 hours. ABG Recent Labs    04/24/17 1730 04/26/17 0940  PHART 7.375 7.368   HCO3 25.3 23.9    Studies/Results:  Anti-infectives: Anti-infectives (From admission, onward)   Start     Dose/Rate Route Frequency Ordered Stop   04/24/17 1700  vancomycin (VANCOCIN) IVPB 1000 mg/200 mL premix  Status:  Discontinued     1,000 mg 200 mL/hr over 60 Minutes Intravenous  Once 04/24/17 1652 04/24/17 1656   04/24/17 1700  vancomycin (VANCOCIN) IVPB 1000 mg/200 mL premix     1,000 mg 200 mL/hr over 60 Minutes Intravenous Every 48 hours 04/24/17 1656     04/23/17 1500  piperacillin-tazobactam (ZOSYN) IVPB 3.375 g     3.375 g 12.5 mL/hr over 240 Minutes Intravenous Every 12 hours 04/23/17 1107     04/18/17 1800  piperacillin-tazobactam (ZOSYN) IVPB 3.375 g  Status:  Discontinued     3.375 g 12.5 mL/hr over 240 Minutes Intravenous Every 12 hours 04/18/17 1032 04/23/17 1107   04/18/17 1400  piperacillin-tazobactam (ZOSYN) IVPB 3.375 g  Status:  Discontinued     3.375 g 12.5 mL/hr over 240 Minutes Intravenous Every 8 hours 04/18/17 1031 04/18/17 1032   04/17/17 1200  piperacillin-tazobactam (ZOSYN) IVPB 3.375 g  Status:  Discontinued     3.375 g 100 mL/hr over 30 Minutes Intravenous Every 6 hours 04/17/17 1030 04/18/17 1031   04/17/17 1100  piperacillin-tazobactam (ZOSYN) IVPB 3.375 g  Status:  Discontinued     3.375 g 12.5 mL/hr over 240 Minutes  Intravenous Every 8 hours 04/17/17 1019 04/17/17 1030   04/15/17 2125  Ampicillin-Sulbactam (UNASYN) 3 g in sodium chloride 0.9 % 100 mL IVPB  Status:  Discontinued     3 g 200 mL/hr over 30 Minutes Intravenous Every 8 hours 04/15/17 1529 04/17/17 1019   04/15/17 1100  ampicillin-sulbactam (UNASYN) 1.5 g in sodium chloride 0.9 % 50 mL IVPB  Status:  Discontinued     1.5 g 100 mL/hr over 30 Minutes Intravenous Every 12 hours 04/15/17 0937 04/15/17 1529   04/13/17 2200  levofloxacin (LEVAQUIN) IVPB 500 mg  Status:  Discontinued     500 mg 100 mL/hr over 60 Minutes Intravenous Every 48 hours 04/12/17 0114 04/12/17 0846   04/13/17  1800  piperacillin-tazobactam (ZOSYN) IVPB 2.25 g  Status:  Discontinued     2.25 g 100 mL/hr over 30 Minutes Intravenous Every 8 hours 04/13/17 1050 04/15/17 0937   04/12/17 0200  piperacillin-tazobactam (ZOSYN) IVPB 3.375 g  Status:  Discontinued     3.375 g 12.5 mL/hr over 240 Minutes Intravenous Every 8 hours 04/12/17 0103 04/13/17 1050   04/12/17 0000  levofloxacin (LEVAQUIN) IVPB 750 mg  Status:  Discontinued     750 mg 100 mL/hr over 90 Minutes Intravenous Every 48 hours 04/11/17 2230 04/12/17 0114      Medications: Scheduled Meds: . darbepoetin (ARANESP) injection - DIALYSIS  200 mcg Intravenous Q Mon-HD  . feeding supplement (ENSURE ENLIVE)  237 mL Oral QID  . folic acid  1 mg Oral QHS  . guaiFENesin  1,200 mg Oral BID  . heparin subcutaneous  5,000 Units Subcutaneous Q8H  . insulin aspart  0-9 Units Subcutaneous Q4H  . ipratropium-albuterol  3 mL Nebulization TID  . mouth rinse  15 mL Mouth Rinse BID  . multivitamin with minerals  1 tablet Oral Daily  . pantoprazole  40 mg Oral QHS  . potassium chloride  40 mEq Oral BID  . sodium chloride flush  10-40 mL Intracatheter Q12H  . thiamine  100 mg Oral QHS  . traZODone  50 mg Oral QHS   Continuous Infusions: . sodium chloride    . sodium chloride 10 mL/hr at 04/25/17 1900  . sodium chloride    . sodium chloride    . dexmedetomidine (PRECEDEX) IV infusion Stopped (04/17/17 1700)  . furosemide 120 mg (04/26/17 0934)  . piperacillin-tazobactam (ZOSYN)  IV Stopped (04/26/17 0802)  . vancomycin Stopped (04/24/17 2133)   PRN Meds:.sodium chloride, sodium chloride, sodium chloride, alteplase, fentaNYL (SUBLIMAZE) injection, heparin, heparin, heparin, ipratropium-albuterol, lidocaine (PF), lidocaine (PF), lidocaine-prilocaine, metoprolol tartrate, ondansetron (ZOFRAN) IV, pentafluoroprop-tetrafluoroeth, sodium chloride flush  Mickeal Skinner, MD Pg# [phone] [facility]

## 2017-04-26 NOTE — Progress Notes (Signed)
RN notified patients 02 sustaining in the low eighties HFNC titrated to 15L with no result.  Patient placed on non-rebreather 02 currently maintaining at 90 percent.  Dr. Alfredia Ferguson updated on patient condition, MD to place orders.

## 2017-04-26 NOTE — Progress Notes (Signed)
Pharmacy Antibiotic Note Dominic Phillips is a 56 y.o. male admitted on 04/11/2017 with diverticular abscess and AKI. Pt required CRRT and IHD during this admission with last IHD session on 11/3. Patient has been maintained on Zosyn  and vancomycin.  - vanc random= 10 at ~4pm (last dose was 1000mg  at ~ 6pm on 11/9) -SCr= 3.05, CrCl ~ 25, UOp 0.7 ml/kg/hr  Plan: -Change vancomycin to 1250mg  IV q48h -Change zosyn to 3.375gm IV q8h -Will follow plans for length of therapy   Height: 5\' 11"  (180.3 cm) Weight: 140 lb 8 oz (63.7 kg) IBW/kg (Calculated) : 75.3  Temp (24hrs), Avg:98.1 F (36.7 C), Min:97.5 F (36.4 C), Max:98.8 F (37.1 C)  Recent Labs  Lab 04/22/17 1515 04/23/17 0307 04/24/17 0325 04/25/17 0258 04/26/17 0248 04/26/17 1542  WBC 15.1* 16.6* 20.8* 19.7* 18.4*  --   CREATININE 3.31* 3.42* 3.15* 3.23* 3.05*  --   VANCORANDOM  --   --   --   --   --  10    Estimated Creatinine Clearance: 24.9 mL/min (A) (by C-G formula based on SCr of 3.05 mg/dL (H)).    No Known Allergies  Antimicrobials this admission:  Zosyn 10/28 >> 10/31; 11/2 >>  Vancomycin 11/9 >>  Unasyn 10/31 >>11/2   Microbiology results: 11/8 pleural fluid: ngtd 10/27 MRSA PCR - negative  10/19 OSH: Intra-op culture: Ecoli (pan sensitive), Finegoldia magna (formerly peptostreptococcus), Staph saccharolyticus, and Prevotella loescheii  Thank you for allowing pharmacy to be a part of this patient's care.  Hildred Laser, Pharm D 04/26/2017 5:36 PM

## 2017-04-26 NOTE — Progress Notes (Signed)
RN notified patients oxygen saturation was in the seventies upon entering the room the patient had removed the bipap.  Patient stated "I cant take this damn thing." RN educated patient on importance of leaving the bipap on, pt. Verbalized understanding.  Bipap currently on patient and oxygen saturation is maintaining in the nineties.  Will continue to monitor.

## 2017-04-26 NOTE — Progress Notes (Signed)
[facility] ROUNDING NOTE   Subjective:   Brief narrative   56 year old man with ETOH abuse and withdrawal, chronic tobacco use and history of a liver abscess and small bowel obstruction that was admitted from [facility] with pericolonic diverticular abscess.  He underwent percutaneous drainage and developed shock requiring pressors and ARDS. He was initially treated with CRRT  10/27 and intermittent dialysis  x1  11/3   His renal function has been gradually improving and his permcath was removed 11/10.  Her underwent thoracentesis 11/8 and cytology is pending   We do not have a baseline renal function although on admission creatinine was 3.93 however renal size was good and only borderline increase in echogenicity   Urine output has been good and weight has decreased all week by about 11 lbs   Creatinine has remained stable at about 3 mg/dl and gradually decreasing.  Chest X Ray 11/11 is suggestive of increased pulmonary edema and 120 mg lasix ordered.  The patient has become more confused overnight and appears to have had a worsening respiratory status requiring BIPAP.  Objective:  Vital signs in last 24 hours:  Temp:  [97.5 F (36.4 C)-98.8 F (37.1 C)] 98.8 F (37.1 C) (11/11 0803) Pulse Rate:  [112-122] 122 (11/11 0940) Resp:  [17-32] 25 (11/11 0940) BP: (117-137)/(76-94) 137/94 (11/11 0940) SpO2:  [90 %-100 %] 100 % (11/11 0940) Weight:  [140 lb 8 oz (63.7 kg)] 140 lb 8 oz (63.7 kg) (11/11 0400)  Weight change:  (-0.907 kg) Filed Weights   04/24/17 0416 04/25/17 0400 04/26/17 0400  Weight: 147 lb 14.9 oz (67.1 kg) 142 lb 8 oz (64.6 kg) 140 lb 8 oz (63.7 kg)    Intake/Output: I/O last 3 completed shifts: In: 1598.2 [P.O.:840; I.V.:208.2; Other:250; IV Piggyback:300] Out: 2390 [Urine:2250; Drains:40; Stool:100]   Intake/Output this shift:  Total I/O In: 260 [P.O.:260] Out: 175 [Urine:175]  Gen:  Some mild respiratory distress with increased confusion  this morning HEENT: poor oral hygiene using nasal canula CVS: RRR, no murmur appreciated  Resp: breath sounds coarse rhonchi but improved [id number], hypoactive bowel sounds Ext: no peripheral edema, some dependent sacral edema     Basic Metabolic Panel: Recent Labs  Lab 04/22/17 1515 04/23/17 0307 04/24/17 0325 04/25/17 0258 04/26/17 0248  NA 131* 130* 131* 131* 131*  K 3.4* 3.5 3.5 3.5 3.0*  CL 96* 95* 95* 95* 93*  CO2 26 25 26 25 25   GLUCOSE 94 95 87 70 108*  BUN 22* 22* 26* 28* 30*  CREATININE 3.31* 3.42* 3.15* 3.23* 3.05*  CALCIUM 7.7* 7.6* 7.7* 7.9* 7.9*  MG 1.6* 1.6* 1.8 1.8 1.7  PHOS 4.4 4.4 4.2 4.5 4.6    Liver Function Tests: Recent Labs  Lab 04/22/17 1515 04/23/17 0307 04/24/17 0325 04/25/17 0258 04/26/17 0248  AST 19 17 18 18 18   ALT 9* 10* 9* 9* 8*  ALKPHOS 67 58 65 64 60  BILITOT 0.7 0.7 0.4 0.4 0.6  PROT 6.0* 5.5* 5.7* 6.5 6.3*  ALBUMIN 1.8* 1.7* 1.7* 1.8* 1.7*   No results for input(s): LIPASE, AMYLASE in the last 168 hours. Recent Labs  Lab 04/24/17 1609 04/25/17 0842  AMMONIA 54* 39*    CBC: Recent Labs  Lab 04/22/17 1515 04/23/17 0307 04/24/17 0325 04/25/17 0258 04/26/17 0248  WBC 15.1* 16.6* 20.8* 19.7* 18.4*  NEUTROABS 12.1* 13.2* 16.4* 14.9* 14.2*  HGB 7.5* 6.9* 8.7* 8.1* 7.6*  HCT 21.9* 20.0* 25.5* 24.2* 22.3*  MCV 94.0  93.9 91.1 91.7 92.1  PLT 313 304 345 334 336    Cardiac Enzymes: No results for input(s): CKTOTAL, CKMB, CKMBINDEX, TROPONINI in the last 168 hours.  BNP: Invalid input(s): POCBNP  CBG: Recent Labs  Lab 04/25/17 1555 04/25/17 2011 04/25/17 2316 04/26/17 0346 04/26/17 0758  GLUCAP 102* 120* 120* 115* 101*    Microbiology: Results for orders placed or performed during the hospital encounter of 04/11/17  MRSA PCR Screening     Status: None   Collection Time: 04/11/17  3:54 PM  Result Value Ref Range Status   MRSA by PCR NEGATIVE NEGATIVE Final    Comment:        The GeneXpert MRSA Assay  (FDA approved for NASAL specimens only), is one component of a comprehensive MRSA colonization surveillance program. It is not intended to diagnose MRSA infection nor to guide or monitor treatment for MRSA infections.   Gram stain     Status: None   Collection Time: 04/23/17  3:54 PM  Result Value Ref Range Status   Specimen Description FLUID PLEURAL  Final   Special Requests NONE  Final   Gram Stain   Final    FEW WBC PRESENT,BOTH PMN AND MONONUCLEAR NO ORGANISMS SEEN    Report Status 04/23/2017 FINAL  Final  Culture, body fluid-bottle     Status: None (Preliminary result)   Collection Time: 04/23/17  3:54 PM  Result Value Ref Range Status   Specimen Description FLUID PLEURAL  Final   Special Requests NONE  Final   Culture NO GROWTH 3 DAYS  Final   Report Status PENDING  Incomplete    Coagulation Studies: No results for input(s): LABPROT, INR in the last 72 hours.  Urinalysis: No results for input(s): COLORURINE, LABSPEC, PHURINE, GLUCOSEU, HGBUR, BILIRUBINUR, KETONESUR, PROTEINUR, UROBILINOGEN, NITRITE, LEUKOCYTESUR in the last 72 hours.  Invalid input(s): APPERANCEUR    Imaging: Dg Chest Port 1 View  Result Date: 04/26/2017 CLINICAL DATA:  Shortness of Breath EXAM: PORTABLE CHEST 1 VIEW COMPARISON:  April 25, 2017 and April 23, 2017 FINDINGS: Central catheter is been removed. No pneumothorax. There is widespread interstitial pulmonary edema. Patchy airspace opacity is noted bilaterally with somewhat increased consolidation medially in the right mid and lower lung zones. Patchy alveolar opacity is seen throughout the perihilar regions bilaterally, slightly more severe on the left than on the right. There are small pleural effusions bilaterally. Heart is upper normal in size with pulmonary venous hypertension. No evident adenopathy. No bone lesions. Small calcified granuloma again noted at left base. IMPRESSION: Pulmonary vascular congestion with extensive pulmonary  edema and small pleural effusions. Consolidation on the right is concerning for superimposed pneumonia in the lower lung zone region. The extent of the edema overall is similar 1 day prior. Electronically Signed   By: Lowella Grip III M.D.   On: 04/26/2017 07:10   Dg Chest Port 1 View  Result Date: 04/25/2017 CLINICAL DATA:  Shortness of Breath EXAM: PORTABLE CHEST 1 VIEW COMPARISON:  April 24, 2017 FINDINGS: Central catheter tip is in the superior vena cava. No pneumothorax. There is diffuse interstitial edema. There is patchy airspace consolidation throughout the left mid lung and right mid lower lung zones with consolidation greatest in the right mid lower lung zone regions. There is a small right pleural effusion. There is a small calcified granuloma left base. Heart is upper normal in size with pulmonary venous hypertension. No adenopathy evident. No bone lesions. IMPRESSION: Pulmonary vascular congestion. Diffuse interstitial pulmonary edema  with patchy alveolar pulmonary edema bilaterally. Superimposed pneumonia, particularly in the right mid to lower lung zone, cannot be excluded. Appearance similar to 1 day prior, although somewhat deeper degree of inspiration on current examination. No new opacity appreciable. Small granuloma left base, stable. Electronically Signed   By: Lowella Grip III M.D.   On: 04/25/2017 07:37     Medications:   . sodium chloride    . sodium chloride 10 mL/hr at 04/25/17 1900  . sodium chloride    . sodium chloride    . dexmedetomidine (PRECEDEX) IV infusion Stopped (04/17/17 1700)  . piperacillin-tazobactam (ZOSYN)  IV Stopped (04/26/17 0802)  . vancomycin Stopped (04/24/17 2133)   . darbepoetin (ARANESP) injection - DIALYSIS  200 mcg Intravenous Q Mon-HD  . feeding supplement (ENSURE ENLIVE)  237 mL Oral QID  . folic acid  1 mg Oral QHS  . guaiFENesin  1,200 mg Oral BID  . heparin subcutaneous  5,000 Units Subcutaneous Q8H  . insulin aspart  0-9  Units Subcutaneous Q4H  . ipratropium-albuterol  3 mL Nebulization TID  . mouth rinse  15 mL Mouth Rinse BID  . multivitamin with minerals  1 tablet Oral Daily  . pantoprazole  40 mg Oral QHS  . potassium chloride  40 mEq Oral BID  . sodium chloride flush  10-40 mL Intracatheter Q12H  . thiamine  100 mg Oral QHS  . traZODone  50 mg Oral QHS   sodium chloride, sodium chloride, sodium chloride, alteplase, fentaNYL (SUBLIMAZE) injection, heparin, heparin, heparin, ipratropium-albuterol, lidocaine (PF), lidocaine (PF), lidocaine-prilocaine, metoprolol tartrate, ondansetron (ZOFRAN) IV, pentafluoroprop-tetrafluoroeth, sodium chloride flush  Assessment/ Plan:  AKI, stable- Baseline Cr unknown.Initially on CRRT,  tolerated IHD. 11/3   Catheter removed on 11/10  Has good urine output and response to lasix although appears to be decompensating from a pulmonary standpoint.  Creatinine has continued to decrease and  Good urine output in the past 24 hrs.    Pleural effusion s/p thoracentesis appears transudative on most recent labs although we are awaiting the results of the cytology                      Hypokalemia, resolving  replete as needed.   Anemia of chronic disease - initiated po iron,Aranesp 200 mcg q Monday  Hemoglobin has continued to decline unfortunately and will follow  VDRF -s/p extubation, increase in wbc and increase fever make me suspicious of a pneumonia process. He is to be placed on BIPAP although I fear that he will need reintubation. Agree with trial of lasix for pulmonary edema.  Diverticular abscess   s/p drain placed by IR, on Zosyn, repeat imaging per primary. Seen by surgery with recommendations for medical management and possible surgery in 6-8 weeks.  Hypophosphatemia, Resolved     LOS: 15 Travoris Bushey W @TODAY @10 :42 AM

## 2017-04-27 ENCOUNTER — Inpatient Hospital Stay (HOSPITAL_COMMUNITY): Payer: Medicaid Other

## 2017-04-27 LAB — COMPREHENSIVE METABOLIC PANEL
ALT: 8 U/L — ABNORMAL LOW (ref 17–63)
ANION GAP: 13 (ref 5–15)
AST: 17 U/L (ref 15–41)
Albumin: 1.7 g/dL — ABNORMAL LOW (ref 3.5–5.0)
Alkaline Phosphatase: 58 U/L (ref 38–126)
BILIRUBIN TOTAL: 0.9 mg/dL (ref 0.3–1.2)
BUN: 32 mg/dL — AB (ref 6–20)
CALCIUM: 7.7 mg/dL — AB (ref 8.9–10.3)
CHLORIDE: 95 mmol/L — AB (ref 101–111)
CO2: 24 mmol/L (ref 22–32)
CREATININE: 2.93 mg/dL — AB (ref 0.61–1.24)
GFR, EST AFRICAN AMERICAN: 26 mL/min — AB (ref >60)
GFR, EST NON AFRICAN AMERICAN: 23 mL/min — AB (ref >60)
Glucose, Bld: 92 mg/dL (ref 65–99)
POTASSIUM: 3.1 mmol/L — AB (ref 3.5–5.1)
Sodium: 132 mmol/L — ABNORMAL LOW (ref 135–145)
Total Protein: 5.9 g/dL — ABNORMAL LOW (ref 6.5–8.1)

## 2017-04-27 LAB — CBC WITH DIFFERENTIAL/PLATELET
BASOS ABS: 0.1 10*3/uL (ref 0.0–0.1)
Basophils Relative: 1 %
EOS PCT: 2 %
Eosinophils Absolute: 0.3 10*3/uL (ref 0.0–0.7)
HEMATOCRIT: 21.9 % — AB (ref 39.0–52.0)
Hemoglobin: 7.4 g/dL — ABNORMAL LOW (ref 13.0–17.0)
LYMPHS PCT: 10 %
Lymphs Abs: 1.4 10*3/uL (ref 0.7–4.0)
MCH: 31.1 pg (ref 26.0–34.0)
MCHC: 33.8 g/dL (ref 30.0–36.0)
MCV: 92 fL (ref 78.0–100.0)
MONO ABS: 1.4 10*3/uL — AB (ref 0.1–1.0)
MONOS PCT: 10 %
NEUTROS ABS: 11.3 10*3/uL — AB (ref 1.7–7.7)
Neutrophils Relative %: 77 %
PLATELETS: 358 10*3/uL (ref 150–400)
RBC: 2.38 MIL/uL — ABNORMAL LOW (ref 4.22–5.81)
RDW: 17.8 % — AB (ref 11.5–15.5)
WBC: 14.4 10*3/uL — ABNORMAL HIGH (ref 4.0–10.5)

## 2017-04-27 LAB — PHOSPHORUS: Phosphorus: 5 mg/dL — ABNORMAL HIGH (ref 2.5–4.6)

## 2017-04-27 LAB — GLUCOSE, CAPILLARY
GLUCOSE-CAPILLARY: 95 mg/dL (ref 65–99)
GLUCOSE-CAPILLARY: 93 mg/dL (ref 65–99)
GLUCOSE-CAPILLARY: 112 mg/dL — AB (ref 65–99)
GLUCOSE-CAPILLARY: 117 mg/dL — AB (ref 65–99)
Glucose-Capillary: 127 mg/dL — ABNORMAL HIGH (ref 65–99)

## 2017-04-27 LAB — PH, BODY FLUID: pH, Body Fluid: 7.6

## 2017-04-27 LAB — MAGNESIUM: Magnesium: 1.6 mg/dL — ABNORMAL LOW (ref 1.7–2.4)

## 2017-04-27 LAB — AMMONIA: AMMONIA: 37 umol/L — AB (ref 9–35)

## 2017-04-27 MED ORDER — FUROSEMIDE 10 MG/ML IJ SOLN
60.0000 mg | Freq: Once | INTRAMUSCULAR | Status: AC
  Administered 2017-04-27: 60 mg via INTRAVENOUS
  Filled 2017-04-27: qty 6

## 2017-04-27 MED ORDER — PIPERACILLIN-TAZOBACTAM 3.375 G IVPB
3.3750 g | Freq: Three times a day (TID) | INTRAVENOUS | Status: DC
  Administered 2017-04-27 – 2017-05-03 (×19): 3.375 g via INTRAVENOUS
  Filled 2017-04-27 (×22): qty 50

## 2017-04-27 NOTE — Progress Notes (Signed)
PROGRESS NOTE    Dominic Phillips  ALP:[id number] DOB: October 07, 1960 DOA: 04/11/2017 PCP: No primary care provider on file.   Brief Narrative:  Dominic Phillips is a 56 y.o. 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 Transferred from [facility] for management of ARDS and progressive renal insufficiency. This gentleman has a long history of heavy ETOH abuse (~ 18 beers per day) and presented to [place] on 04/02/2017 with a c/o of abdominal pain without fevers, leukocytosis or diarrhea. An abd CT revealed pericolonic inflammation/fluid collection, for which he was admitted for diverticular abscess. Was consulted by Gen Surg who suggested placement of a catheter by interventional radiology; subsequent cultures grew E. Coli, for which Zosyn was initiated. Hospital course was complicated by the development of DTs, treated with 1 beer tid and Ativan prn. He slowly improved but worsened on 10/23 with increased WBC and progressive hypoxemia. Repeat abdominal CT showed improving abscess but developing SBO and multilobar pneumonia. Hypoxemia could not be corrected with BiPAP, along with worsening renal function such that he was intubated on 10/25 and required pressors for hemodynamic support. CT chest/abdomen/pelvis on 10/26 showed bilateral pneumonia with volume overload; no change in the RLQ percutaneous drain catheter. With rising creat to 3.1 and 290 cc U/O over 24 hours, transfer was requested.   He required intubation from 10/27 to 11/2 for ARDS and aspiration pneumonia and pressors for circulatory shock in ICU. He was receiving CRRT for AKI and has tolerated IHD x1. Worsening leukocytosis possibly secondary abdominal abscess. Pleural effusion also worsened so patient underwent Thoracentesis 11/8. Blood Count dropped so he was given 1 unit of pRBC's. Nephrology giving 120 mg of IV Lasix yesterday.   On the night of 11/08-11/09 patient had a coughing episode and vomited and  aspirated. He was a little confused. Throughout the course of the day he remained slightly confused so an ABG was obtained and he was Hypoxemic so his O2 was increased to 15 Liters and repeat ABG ordered and was normal. Continue to Wean O2. Abx broadened and Vanc added. Patient had breathing treatments added today and was going try to be weaned down on his O2.  Today the patient desaturated to the 80's and was placed on 15 Liters of HFNC with no real improvement and was eventually switched to NRB and then BiPAP with improvement. Patient was given 120 mg of IV Lasix as he sounded wet. Patient remains intermittently confused and likely from Hypoxemia. Patient improved respiratory wise and was placed back on 15 HFNC.   Assessment & Plan:   Active Problems:   Acute respiratory distress syndrome (ARDS) (HCC)   Acute kidney injury ([place])   Pressure injury of skin   Colonic diverticular abscess   COPD (chronic obstructive pulmonary disease) (HCC)   Dyspnea   HCAP (healthcare-associated pneumonia)   Acute blood loss anemia   Anemia of chronic disease   ETOH abuse   Tobacco abuse   Tachycardia   Tachypnea   Leukocytosis   Post-operative pain  Acute respiratory distress syndrome/VDRF s/p Extubation 11/2 now requiring NIPPV with BiPAP, improved slightly  -Seen on chest x-ray but possibly secondary to volume overload in setting of acute kidney injury Patient required intubation from 10/27 to 11/2.  -C/w Supplemental O2 via [place] and had to increase to 15 Liters HFNC and then place on BiPAP eventually; Now back on 15 Liters -ABG done today showed 7.368/42.5/114/23.9/98.4% on 100% NRB -Ordered Thoracentesis and was done and drained  1.3 Liters -Repeat CXR yesterday 11/9 showed Small bilateral effusions Interval worsening of diffuse interstitial and alveolar opacity consistent with worsening edema or diffuse pneumonia. -CXR 11/10 AM showed Pulmonary vascular congestion. Diffuse interstitial pulmonary edema  with patchy alveolar pulmonary edema bilaterally. Superimposed pneumonia, particularly in the right mid to lower lung zone, cannot be excluded. Appearance similar to 1 day prior, although somewhat deeper degree of inspiration on current examination. No new opacity appreciable. Small granuloma left base, stable  -CXR 11/11 AM showed Pulmonary vascular congestion with extensive pulmonary edema and small pleural effusions. Consolidation on the right is concerning for superimposed pneumonia in the lower lung zone region. The extent of the edema overall is similar 1 day prior. -C/w IV Vancomycin and IV Zosyn  -Given  IV 120 this AM and 60 IV in the evening yesterday. Will give an additional 60 mg IV Lasix today.  -Repeat CXR this AM showed Minimal change from prior examination. Persistent asymmetric airspace disease greater on right which may represent pulmonary edema with right-sided pleural effusion and basilar atelectasis. Cannot exclude right lower lobe infiltrate in this setting of pleural fluid and right base atelectasis.  Bilateral pleural effusions with Right worse than Left, improved -Patient still on oxygen. No respiratory distress -Nephrology recommendations appreciated -Thoracentesis done and drained 1.3 Liters -Follow up on Fluid Studies  -Repeat CXR showed successful Right Sided Thoracentesis. No more than a small amount of Pleural Fluid Remains. No post-procedure Pneumothorax -CXR today showed Pulmonary Vascular Congestion and Diffuse interstitial Pulmonary Edema with patchy alveolar edema bilaterally. Superimposed PNA in the Right to Mid to Lower Lung Zone could not be excluded.  -Tried 120 mg of IV Lasix this AM and 60 mg of IV Lasix yesterday. Will give an additional 60 mg this AM.     Aspiration Pneumonia / HCAP -Treated with Zosyn. Improved. Course completed. Mild sputum production which has improved. Chest x-ray with ?progression. -Likely more secondary to effusion. -Aspirated  again yesterday so added IV Vanc to broaden Coverage. Continue IV Zosyn that was  -Add Incentive Spirometry and Flutter Valve -Added DuoNeb Scheduled and Mucinex -Repeat SLP ok and showed that patient is mild Aspiration risk  -WBC now 14.4  Diverticular Abscess -S/p drain. On zosyn per ID. Leukocytosis worsened yesterday but slightly improved. CT scan shows minimally improved abscess that is not communicating with drain. -General surgery recommendations appreciated; Plan for now is continue Drain and Abx for perforation and long term plan is to control leak and keep on Abx and have definitive Surgery in 6-8 weeks -Pert General Surgery will need to increase po intake/protein intake so they are checking Pre-Albumin. Will consult Nutritionist  -Pre-Albuminm Low so Nutrition consulted and recommending changing Ensure to QID  -Continue IV Zosyn; Added IV Vancomycin  -WBC now 18.4  Abdominal wound drainage -Per general surgery recommendations as above  Leukocytosis -Possibly secondary to abdominal abscess and likley PNA. Afebrile. -trend CBC -management per above; WBC went from 17.4 -> 15.1 -> 16.6 -> 20.8 -> 19.7 -> 18.4 -> 14.4  Acute Kidney Injury, -Patient initially with oliguria requiring CRRT. Had IHD on 11/3 without issue -nephrology recommendations: IHD -BUN/Cr went from 21/3.24 -> 22/3.31 -> 22/3.42 -> 26/3.15 -> 28/3.23 -> 30/3.05 -> 32/2.93 -Tried IV 120 mg Lasix And 60 mg IV Lasix in the evening yesterday given CXR findings and Hypoxia; Will try 60 mg of IV Lasix again today -Continue to Monitor and no dialysis indicated today and Nephrology states foley can be removed  -Temp  HD Cath removed   COPD -Patient states he has quit smoking. Mild cough -continue Duoneb Scheduled and PRN -Added Mucinex -As Above  Circulatory shock -Patient required Levophed and was weaned off on 04/17/17. -Normotensive. -Continue to Monitor Hemodynamic Status   Ethanol Abuse -Out of  window for Withdrawals.  Hypokalemia -Supplement with oral potassium 40 mEQ TID -K+  3.1 -Continue to Monitor and Replete As Necessary -Repeat CMP in AM  Insomnia -Continue Trazodone -Discontinue Ambien  Normocytic Anemia  -Hb/Hct trended down from 7.7/23.3 -> 7.5/21.9 -> 6.9/20.0 -Transfuse 1 unit of pRBC's and then blood count improved to 8.7/25.5 and now trending back down as went from 8.1/24.2 -> 7.6/22.3 -> 7.4/21.9 -Continue to Monitor for S/Sx of bleeding -Repeat CBC in AM   Hypomagnesemia -Patient's Mag Level was 1.6  -Replete with IV Mag Sulfate 1 gram  -Continue to Monitor and Replete as Necessary -Repeat Mag Level in AM   Hyperammonemia -Ammonia Level slightly High at 54, Repeat was 39 -Repeat Ammonia Level in AM and if higher or worse add Laculose   DVT prophylaxis: Heparin 5,000 units sq q8h Code Status: Partial Code, DNI Family Communication: No family present at bedside Disposition Plan: Remain Inpatient as he continues to be treated medically  Consultants:   General Surgery  PCCM  Nephrology  CIR   Procedures:  CRRT IHD   Antimicrobials: Anti-infectives (From admission, onward)   Start     Dose/Rate Route Frequency Ordered Stop   04/27/17 1400  piperacillin-tazobactam (ZOSYN) IVPB 3.375 g     3.375 g 12.5 mL/hr over 240 Minutes Intravenous Every 8 hours 04/27/17 1158     04/26/17 1830  vancomycin (VANCOCIN) 1,250 mg in sodium chloride 0.9 % 250 mL IVPB     1,250 mg 166.7 mL/hr over 90 Minutes Intravenous Every 48 hours 04/26/17 1739     04/24/17 1700  vancomycin (VANCOCIN) IVPB 1000 mg/200 mL premix  Status:  Discontinued     1,000 mg 200 mL/hr over 60 Minutes Intravenous  Once 04/24/17 1652 04/24/17 1656   04/24/17 1700  vancomycin (VANCOCIN) IVPB 1000 mg/200 mL premix  Status:  Discontinued     1,000 mg 200 mL/hr over 60 Minutes Intravenous Every 48 hours 04/24/17 1656 04/26/17 1739   04/23/17 1500  piperacillin-tazobactam (ZOSYN)  IVPB 3.375 g  Status:  Discontinued     3.375 g 12.5 mL/hr over 240 Minutes Intravenous Every 12 hours 04/23/17 1107 04/27/17 1158   04/18/17 1800  piperacillin-tazobactam (ZOSYN) IVPB 3.375 g  Status:  Discontinued     3.375 g 12.5 mL/hr over 240 Minutes Intravenous Every 12 hours 04/18/17 1032 04/23/17 1107   04/18/17 1400  piperacillin-tazobactam (ZOSYN) IVPB 3.375 g  Status:  Discontinued     3.375 g 12.5 mL/hr over 240 Minutes Intravenous Every 8 hours 04/18/17 1031 04/18/17 1032   04/17/17 1200  piperacillin-tazobactam (ZOSYN) IVPB 3.375 g  Status:  Discontinued     3.375 g 100 mL/hr over 30 Minutes Intravenous Every 6 hours 04/17/17 1030 04/18/17 1031   04/17/17 1100  piperacillin-tazobactam (ZOSYN) IVPB 3.375 g  Status:  Discontinued     3.375 g 12.5 mL/hr over 240 Minutes Intravenous Every 8 hours 04/17/17 1019 04/17/17 1030   04/15/17 2125  Ampicillin-Sulbactam (UNASYN) 3 g in sodium chloride 0.9 % 100 mL IVPB  Status:  Discontinued     3 g 200 mL/hr over 30 Minutes Intravenous Every 8 hours 04/15/17 1529 04/17/17 1019   04/15/17 1100  ampicillin-sulbactam (UNASYN) 1.5 g  in sodium chloride 0.9 % 50 mL IVPB  Status:  Discontinued     1.5 g 100 mL/hr over 30 Minutes Intravenous Every 12 hours 04/15/17 0937 04/15/17 1529   04/13/17 2200  levofloxacin (LEVAQUIN) IVPB 500 mg  Status:  Discontinued     500 mg 100 mL/hr over 60 Minutes Intravenous Every 48 hours 04/12/17 0114 04/12/17 0846   04/13/17 1800  piperacillin-tazobactam (ZOSYN) IVPB 2.25 g  Status:  Discontinued     2.25 g 100 mL/hr over 30 Minutes Intravenous Every 8 hours 04/13/17 1050 04/15/17 0937   04/12/17 0200  piperacillin-tazobactam (ZOSYN) IVPB 3.375 g  Status:  Discontinued     3.375 g 12.5 mL/hr over 240 Minutes Intravenous Every 8 hours 04/12/17 0103 04/13/17 1050   04/12/17 0000  levofloxacin (LEVAQUIN) IVPB 750 mg  Status:  Discontinued     750 mg 100 mL/hr over 90 Minutes Intravenous Every 48 hours  04/11/17 2230 04/12/17 0114     Subjective: Seen and examined and was wearing 15 Liters HFNC. Nephrology to remove Foley today. No CP or SOB. No other concerns or complaints at this time.   Objective: Vitals:   04/27/17 1508 04/27/17 1548 04/27/17 1600 04/27/17 1947  BP: (!) 121/91  124/89 113/84  Pulse:   (!) 119 (!) 127  Resp:   (!) 26 (!) 28  Temp:   98.5 F (36.9 C) 98.9 F (37.2 C)  TempSrc:   Oral Oral  SpO2:  95% 99% 96%  Weight:      Height:        Intake/Output Summary (Last 24 hours) at 04/27/2017 2049 Last data filed at 04/27/2017 1846 Gross per 24 hour  Intake 1497.67 ml  Output 1650 ml  Net -152.33 ml   Filed Weights   04/25/17 0400 04/26/17 0400 04/27/17 0254  Weight: 64.6 kg (142 lb 8 oz) 63.7 kg (140 lb 8 oz) 66.4 kg (146 lb 6.2 oz)   Examination: Physical Exam:  Constitutional: Thin Caucasian male in NAD who is more awake.  ENMT: External Ears and nose appear normal. MMM Neck: Supple with no JVD. Respiratory: Diminished with rhonchi and diffuse crackles. Tachypenic but not using any accessory muscles to breathe Cardiovascular: Tachycardic rate but regular rhythm. No appreciable m/r/g. Mild Extremity edema Abdomen: Soft, Slightly tender to palpated. ND. Has RLQ Drain GU: Deferred. Foley Cath was in place and going to be removed today. Dignisheild in place Musculoskeletal: No contractures. No cyanosis Skin: Warm and dry. No rashes or lesions on a limited skin eval Neurologic: CN 2-12 grossly intact. No appreciable focal deficits Psychiatric: Normal mood and affect. Awake and alert  Data Reviewed: I have personally reviewed following labs and imaging studies  CBC: Recent Labs  Lab 04/23/17 0307 04/24/17 0325 04/25/17 0258 04/26/17 0248 04/27/17 0335  WBC 16.6* 20.8* 19.7* 18.4* 14.4*  NEUTROABS 13.2* 16.4* 14.9* 14.2* 11.3*  HGB 6.9* 8.7* 8.1* 7.6* 7.4*  HCT 20.0* 25.5* 24.2* 22.3* 21.9*  MCV 93.9 91.1 91.7 92.1 92.0  PLT 304 345 334 336 154    Basic Metabolic Panel: Recent Labs  Lab 04/23/17 0307 04/24/17 0325 04/25/17 0258 04/26/17 0248 04/27/17 0335  NA 130* 131* 131* 131* 132*  K 3.5 3.5 3.5 3.0* 3.1*  CL 95* 95* 95* 93* 95*  CO2 25 26 25 25 24   GLUCOSE 95 87 70 108* 92  BUN 22* 26* 28* 30* 32*  CREATININE 3.42* 3.15* 3.23* 3.05* 2.93*  CALCIUM 7.6* 7.7* 7.9* 7.9* 7.7*  MG  1.6* 1.8 1.8 1.7 1.6*  PHOS 4.4 4.2 4.5 4.6 5.0*   GFR: Estimated Creatinine Clearance: 27.1 mL/min (A) (by C-G formula based on SCr of 2.93 mg/dL (H)). Liver Function Tests: Recent Labs  Lab 04/23/17 0307 04/24/17 0325 04/25/17 0258 04/26/17 0248 04/27/17 0335  AST 17 18 18 18 17   ALT 10* 9* 9* 8* 8*  ALKPHOS 58 65 64 60 58  BILITOT 0.7 0.4 0.4 0.6 0.9  PROT 5.5* 5.7* 6.5 6.3* 5.9*  ALBUMIN 1.7* 1.7* 1.8* 1.7* 1.7*   No results for input(s): LIPASE, AMYLASE in the last 168 hours. Recent Labs  Lab 04/24/17 1609 04/25/17 0842 04/27/17 0335  AMMONIA 54* 39* 37*   Coagulation Profile: No results for input(s): INR, PROTIME in the last 168 hours. Cardiac Enzymes: No results for input(s): CKTOTAL, CKMB, CKMBINDEX, TROPONINI in the last 168 hours. BNP (last 3 results) No results for input(s): PROBNP in the last 8760 hours. HbA1C: No results for input(s): HGBA1C in the last 72 hours. CBG: Recent Labs  Lab 04/27/17 0329 04/27/17 0747 04/27/17 1148 04/27/17 1557 04/27/17 1952  GLUCAP 95 93 112* 117* 127*   Lipid Profile: No results for input(s): CHOL, HDL, LDLCALC, TRIG, CHOLHDL, LDLDIRECT in the last 72 hours. Thyroid Function Tests: No results for input(s): TSH, T4TOTAL, FREET4, T3FREE, THYROIDAB in the last 72 hours. Anemia Panel: No results for input(s): VITAMINB12, FOLATE, FERRITIN, TIBC, IRON, RETICCTPCT in the last 72 hours. Sepsis Labs: No results for input(s): PROCALCITON, LATICACIDVEN in the last 168 hours.  Recent Results (from the past 240 hour(s))  Gram stain     Status: None   Collection Time: 04/23/17   3:54 PM  Result Value Ref Range Status   Specimen Description FLUID PLEURAL  Final   Special Requests NONE  Final   Gram Stain   Final    FEW WBC PRESENT,BOTH PMN AND MONONUCLEAR NO ORGANISMS SEEN    Report Status 04/23/2017 FINAL  Final  Culture, body fluid-bottle     Status: None (Preliminary result)   Collection Time: 04/23/17  3:54 PM  Result Value Ref Range Status   Specimen Description FLUID PLEURAL  Final   Special Requests NONE  Final   Culture NO GROWTH 4 DAYS  Final   Report Status PENDING  Incomplete     Radiology Studies: Dg Chest Port 1 View  Result Date: 04/27/2017 CLINICAL DATA:  56 year old male with shortness breath. Subsequent encounter. EXAM: PORTABLE CHEST 1 VIEW COMPARISON:  04/26/2017 chest x-ray. FINDINGS: Asymmetric airspace disease greater on right which may represent pulmonary edema with right-sided pleural effusion and basilar atelectasis. Limited for excluding right base infiltrate or mass given the degree of pleural fluid and atelectasis. No pneumothorax. Heart size within normal limits. IMPRESSION: Minimal change from prior examination. Persistent asymmetric airspace disease greater on right which may represent pulmonary edema with right-sided pleural effusion and basilar atelectasis. Cannot exclude right lower lobe infiltrate in this setting of pleural fluid and right base atelectasis. Electronically Signed   By: Genia Del M.D.   On: 04/27/2017 07:47   Dg Chest Port 1 View  Result Date: 04/26/2017 CLINICAL DATA:  Shortness of Breath EXAM: PORTABLE CHEST 1 VIEW COMPARISON:  April 25, 2017 and April 23, 2017 FINDINGS: Central catheter is been removed. No pneumothorax. There is widespread interstitial pulmonary edema. Patchy airspace opacity is noted bilaterally with somewhat increased consolidation medially in the right mid and lower lung zones. Patchy alveolar opacity is seen throughout the perihilar regions bilaterally, slightly  more severe on the  left than on the right. There are small pleural effusions bilaterally. Heart is upper normal in size with pulmonary venous hypertension. No evident adenopathy. No bone lesions. Small calcified granuloma again noted at left base. IMPRESSION: Pulmonary vascular congestion with extensive pulmonary edema and small pleural effusions. Consolidation on the right is concerning for superimposed pneumonia in the lower lung zone region. The extent of the edema overall is similar 1 day prior. Electronically Signed   By: Lowella Grip III M.D.   On: 04/26/2017 07:10   Scheduled Meds: . feeding supplement (ENSURE ENLIVE)  237 mL Oral QID  . folic acid  1 mg Oral QHS  . guaiFENesin  1,200 mg Oral BID  . heparin subcutaneous  5,000 Units Subcutaneous Q8H  . insulin aspart  0-9 Units Subcutaneous Q4H  . ipratropium-albuterol  3 mL Nebulization TID  . mouth rinse  15 mL Mouth Rinse BID  . multivitamin with minerals  1 tablet Oral Daily  . pantoprazole  40 mg Oral QHS  . potassium chloride  40 mEq Oral TID  . sodium chloride flush  10-40 mL Intracatheter Q12H  . thiamine  100 mg Oral QHS  . traZODone  50 mg Oral QHS   Continuous Infusions: . sodium chloride    . sodium chloride 10 mL/hr at 04/27/17 1300  . sodium chloride    . sodium chloride    . dexmedetomidine (PRECEDEX) IV infusion Stopped (04/17/17 1700)  . piperacillin-tazobactam (ZOSYN)  IV Stopped (04/27/17 1737)  . vancomycin Stopped (04/26/17 2017)     LOS: 16 days   Kerney Elbe, DO [facility] Pager [phone]  If 7PM-7AM, please contact night-coverage [email or web address] Password [facility] 04/27/2017, 8:49 PM

## 2017-04-27 NOTE — Progress Notes (Signed)
RN called to report patient pulled off BIPAP mask. Placed on 8L HFNC.

## 2017-04-27 NOTE — Progress Notes (Signed)
Subjective/Chief Complaint: Still requiring occasional BIPAP Sundowning - disoriented at night Some abdominal discomfort over lower abdomen   Objective: Vital signs in last 24 hours: Temp:  [97.6 F (36.4 C)-98.8 F (37.1 C)] 97.6 F (36.4 C) (11/12 0025) Pulse Rate:  [102-122] 118 (11/12 0025) Resp:  [17-31] 26 (11/11 1619) BP: (128-140)/(88-95) 131/88 (11/12 0025) SpO2:  [90 %-100 %] 95 % (11/12 0025) FiO2 (%):  [40 %-50 %] 40 % (11/11 2051) Weight:  [66.4 kg (146 lb 6.2 oz)] 66.4 kg (146 lb 6.2 oz) (11/12 0254) Last BM Date: 04/26/17  Intake/Output from previous day: 11/11 0701 - 11/12 0700 In: 860 [P.O.:260; I.V.:250; IV Piggyback:350] Out: 1696 [Urine:1025; Drains:750] Intake/Output this shift: No intake/output data recorded.  PE:   WDWN in NAD Abd - mildly distended; bilateral lower quadrant abdominal pain Drain with feculent output  Lab Results:  Recent Labs    04/26/17 0248 04/27/17 0335  WBC 18.4* 14.4*  HGB 7.6* 7.4*  HCT 22.3* 21.9*  PLT 336 358   BMET Recent Labs    04/26/17 0248 04/27/17 0335  NA 131* 132*  K 3.0* 3.1*  CL 93* 95*  CO2 25 24  GLUCOSE 108* 92  BUN 30* 32*  CREATININE 3.05* 2.93*  CALCIUM 7.9* 7.7*   PT/INR No results for input(s): LABPROT, INR in the last 72 hours. ABG Recent Labs    04/24/17 1730 04/26/17 0940  PHART 7.375 7.368  HCO3 25.3 23.9    Studies/Results: Dg Chest Port 1 View  Result Date: 04/26/2017 CLINICAL DATA:  Shortness of Breath EXAM: PORTABLE CHEST 1 VIEW COMPARISON:  April 25, 2017 and April 23, 2017 FINDINGS: Central catheter is been removed. No pneumothorax. There is widespread interstitial pulmonary edema. Patchy airspace opacity is noted bilaterally with somewhat increased consolidation medially in the right mid and lower lung zones. Patchy alveolar opacity is seen throughout the perihilar regions bilaterally, slightly more severe on the left than on the right. There are small pleural  effusions bilaterally. Heart is upper normal in size with pulmonary venous hypertension. No evident adenopathy. No bone lesions. Small calcified granuloma again noted at left base. IMPRESSION: Pulmonary vascular congestion with extensive pulmonary edema and small pleural effusions. Consolidation on the right is concerning for superimposed pneumonia in the lower lung zone region. The extent of the edema overall is similar 1 day prior. Electronically Signed   By: Lowella Grip III M.D.   On: 04/26/2017 07:10    Anti-infectives: Anti-infectives (From admission, onward)   Start     Dose/Rate Route Frequency Ordered Stop   04/26/17 1830  vancomycin (VANCOCIN) 1,250 mg in sodium chloride 0.9 % 250 mL IVPB     1,250 mg 166.7 mL/hr over 90 Minutes Intravenous Every 48 hours 04/26/17 1739     04/24/17 1700  vancomycin (VANCOCIN) IVPB 1000 mg/200 mL premix  Status:  Discontinued     1,000 mg 200 mL/hr over 60 Minutes Intravenous  Once 04/24/17 1652 04/24/17 1656   04/24/17 1700  vancomycin (VANCOCIN) IVPB 1000 mg/200 mL premix  Status:  Discontinued     1,000 mg 200 mL/hr over 60 Minutes Intravenous Every 48 hours 04/24/17 1656 04/26/17 1739   04/23/17 1500  piperacillin-tazobactam (ZOSYN) IVPB 3.375 g     3.375 g 12.5 mL/hr over 240 Minutes Intravenous Every 12 hours 04/23/17 1107     04/18/17 1800  piperacillin-tazobactam (ZOSYN) IVPB 3.375 g  Status:  Discontinued     3.375 g 12.5 mL/hr over 240 Minutes  Intravenous Every 12 hours 04/18/17 1032 04/23/17 1107   04/18/17 1400  piperacillin-tazobactam (ZOSYN) IVPB 3.375 g  Status:  Discontinued     3.375 g 12.5 mL/hr over 240 Minutes Intravenous Every 8 hours 04/18/17 1031 04/18/17 1032   04/17/17 1200  piperacillin-tazobactam (ZOSYN) IVPB 3.375 g  Status:  Discontinued     3.375 g 100 mL/hr over 30 Minutes Intravenous Every 6 hours 04/17/17 1030 04/18/17 1031   04/17/17 1100  piperacillin-tazobactam (ZOSYN) IVPB 3.375 g  Status:  Discontinued      3.375 g 12.5 mL/hr over 240 Minutes Intravenous Every 8 hours 04/17/17 1019 04/17/17 1030   04/15/17 2125  Ampicillin-Sulbactam (UNASYN) 3 g in sodium chloride 0.9 % 100 mL IVPB  Status:  Discontinued     3 g 200 mL/hr over 30 Minutes Intravenous Every 8 hours 04/15/17 1529 04/17/17 1019   04/15/17 1100  ampicillin-sulbactam (UNASYN) 1.5 g in sodium chloride 0.9 % 50 mL IVPB  Status:  Discontinued     1.5 g 100 mL/hr over 30 Minutes Intravenous Every 12 hours 04/15/17 0937 04/15/17 1529   04/13/17 2200  levofloxacin (LEVAQUIN) IVPB 500 mg  Status:  Discontinued     500 mg 100 mL/hr over 60 Minutes Intravenous Every 48 hours 04/12/17 0114 04/12/17 0846   04/13/17 1800  piperacillin-tazobactam (ZOSYN) IVPB 2.25 g  Status:  Discontinued     2.25 g 100 mL/hr over 30 Minutes Intravenous Every 8 hours 04/13/17 1050 04/15/17 0937   04/12/17 0200  piperacillin-tazobactam (ZOSYN) IVPB 3.375 g  Status:  Discontinued     3.375 g 12.5 mL/hr over 240 Minutes Intravenous Every 8 hours 04/12/17 0103 04/13/17 1050   04/12/17 0000  levofloxacin (LEVAQUIN) IVPB 750 mg  Status:  Discontinued     750 mg 100 mL/hr over 90 Minutes Intravenous Every 48 hours 04/11/17 2230 04/12/17 0114      Assessment/Plan: Diverticulitis with perforation s/p IR drain - Repeat CT 04/19/17: no significant residual abscess around drain, 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 Stable small bowel dilation. - Afebrile, WBC has been trending down  - Drain750cc/24h, continue drain - Having bowel function(watery stool in rectal tube)and pain improving - Continue IV abx - continue therapies -Long term plan would be to control leak and keep on abx. Hopefully have definitive surgery in 6-8 weeks, possibly with Dr. Lilia Pro at [facility] or Dr. Rosendo Gros here. Will need to increase PO intake/protein intake.   FEN: HH/carb mod; prealbumin 7.9 on 11/8 - increase ensure to TID. Encourage 5-6 small meals daily. ID: Unasyn 10/31-11/2, Zosyn  10/28  VTE: SCD's, SQ heparin     LOS: 16 days    Dominic Hankin K. 04/27/2017

## 2017-04-27 NOTE — Progress Notes (Signed)
Physical Therapy Treatment Patient Details Name: Dominic Phillips MRN: [id number] DOB: 02/11/1961 Today's Date: 04/27/2017    History of Present Illness Pt admitted to [facility] and transferred on 04/02/17 with c/o abdominal pain.  Pt presented with ETOH withdrawls, liver abscess and SBO on TPN.  10/23 presents with hypoxia and increased WBC count, 10/25 intubated, 10/26 scans show PNA and fluid overload, patient extubated on 04/17/17.  Chart review shows COPD, VDRF, anemia, AKI and hypokalemia in addition to previous listed statements.  PMH: ETOH abuse.  11/8 R thoracentesis.    PT Comments    Pt in bed upon arrival. He was happy to report he had gotten the "top catheter out." Pt's BP supine before treatment was 124/94 and 121/91 seated after treatment. Pt required verbal and tactile cues to push with foot while rolling over. Pt able to get to sidelying with increased time and VCs. Mod physical assist to elevate trunk from bed. Pt able to scoot EOB with VCs on technique. Pt's HR in the 130s. Mod assist for sit to stand. Due to increased HR and fatigue, pt ambulated shorter distance. HR stayed in the 130s during ambulation; peaking at 135 bpm. O2 desated to 87% HFNC 8 L/min. VCs for posture and neutral head. Pt fatigued and was wheeled back to his room in recliner. RN in room when left; notified her of increased HR. Pt would benefit from skilled PT to increase strength, endurance, mobility, and functional independence.  Follow Up Recommendations  CIR     Equipment Recommendations  Rolling walker with 5" wheels;3in1 (PT)    Recommendations for Other Services       Precautions / Restrictions Precautions Precautions: Fall Precaution Comments: Drain, Flexiseal, catheter Restrictions Weight Bearing Restrictions: No    Mobility  Bed Mobility Overal bed mobility: Needs Assistance Bed Mobility: Sidelying to Sit;Rolling Rolling: Min assist Sidelying to sit: Mod assist       General  bed mobility comments: VCs for foot and hand placement while rolling. Mod assist for elevating trunk from sidelying position. Pt able to scoot EOB with increased time.  Transfers Overall transfer level: Needs assistance Equipment used: Rolling walker (2 wheeled) Transfers: Sit to/from Stand Sit to Stand: Mod assist         General transfer comment: Pt required extra time. Mod assist for power up and to straighten trunk. Multiple VCs to push from bed and not RW.  Ambulation/Gait Ambulation/Gait assistance: Min assist;+2 safety/equipment(chair follow) Ambulation Distance (Feet): 45 Feet Assistive device: Rolling walker (2 wheeled) Gait Pattern/deviations: Step-through pattern;Decreased step length - right;Decreased step length - left;Narrow base of support Gait velocity: slow   General Gait Details: Pt's HR increased to 135 bpm EOB. Pt about to ambulate decreased distance due to HR, O2 desat (86% HF [place] 8 L/min), and fatigue. VCs for erect posture, neutral head postion, and breathing. Pt wheeled back to room in chair.    Stairs            Wheelchair Mobility    Modified Rankin (Stroke Patients Only)       Balance Overall balance assessment: Needs assistance Sitting-balance support: No upper extremity supported;Feet supported Sitting balance-Leahy Scale: Fair     Standing balance support: Bilateral upper extremity supported Standing balance-Leahy Scale: Poor Standing balance comment: Relies on UE support.                            Cognition Arousal/Alertness: Awake/alert Behavior During Therapy:  WFL for tasks assessed/performed Overall Cognitive Status: Within Functional Limits for tasks assessed                                        Exercises      General Comments        Pertinent Vitals/Pain Pain Assessment: 0-10 Pain Score: 8  Pain Location: anterior abdominal area Pain Descriptors / Indicators:  Aching;Grimacing;Guarding;Moaning Pain Intervention(s): Limited activity within patient's tolerance;Monitored during session;Repositioned    Home Living                      Prior Function            PT Goals (current goals can now be found in the care plan section) Acute Rehab PT Goals Patient Stated Goal: none discussed PT Goal Formulation: With patient Time For Goal Achievement: 05/02/17 Potential to Achieve Goals: Good Progress towards PT goals: Progressing toward goals    Frequency    Min 3X/week      PT Plan Current plan remains appropriate    Co-evaluation              AM-PAC PT "6 Clicks" Daily Activity  Outcome Measure  Difficulty turning over in bed (including adjusting bedclothes, sheets and blankets)?: A Lot Difficulty moving from lying on back to sitting on the side of the bed? : A Lot Difficulty sitting down on and standing up from a chair with arms (e.g., wheelchair, bedside commode, etc,.)?: A Lot Help needed moving to and from a bed to chair (including a wheelchair)?: A Little Help needed walking in hospital room?: A Little Help needed climbing 3-5 steps with a railing? : Total 6 Click Score: 13    End of Session Equipment Utilized During Treatment: Oxygen;Gait belt Activity Tolerance: Patient tolerated treatment well Patient left: with call bell/phone within reach;in chair;with chair alarm set;with nursing/sitter in room Nurse Communication: Mobility status;Other (comment)(increase in HR) PT Visit Diagnosis: Unsteadiness on feet (R26.81);Muscle weakness (generalized) (M62.81);Other abnormalities of gait and mobility (R26.89)     Time: 5102-5852 PT Time Calculation (min) (ACUTE ONLY): 31 min  Charges:  $Gait Training: 8-22 mins $Therapeutic Activity: 8-22 mins                    G Codes:       Janna Arch, SPTA   Janna Arch 04/27/2017, 4:27 PM

## 2017-04-27 NOTE — Progress Notes (Signed)
[facility] ROUNDING NOTE   Subjective:   Brief narrative   56 year old man with ETOH abuse and withdrawal, chronic tobacco use and history of a liver abscess and small bowel obstruction that was admitted from [facility] with pericolonic diverticular abscess.  He underwent percutaneous drainage and developed shock requiring pressors and ARDS. He was initially treated with CRRT  10/27 and intermittent dialysis  x1  11/3   His renal function has been gradually improving and his permcath was removed 11/10.  Her underwent thoracentesis 11/8 and cytology is pending   We do not have a baseline renal function although on admission creatinine was 3.93 however renal size was good and only borderline increase in echogenicity   Urine output has been good and weight has decreased all week by about 11 lbs   Creatinine has remained stable at about 3 mg/dl and gradually decreasing.  Chest X Ray 11/11 is suggestive of increased pulmonary edema and 120 mg lasix ordered.   Objective:  Vital signs in last 24 hours:  Temp:  [97.6 F (36.4 C)-98.4 F (36.9 C)] 98.4 F (36.9 C) (11/12 0749) Pulse Rate:  [107-118] 114 (11/12 0749) Resp:  [20-28] 28 (11/12 0749) BP: (128-140)/(88-95) 133/91 (11/12 0749) SpO2:  [90 %-100 %] 98 % (11/12 1125) FiO2 (%):  [40 %-50 %] 40 % (11/11 2051) Weight:  [146 lb 6.2 oz (66.4 kg)] 146 lb 6.2 oz (66.4 kg) (11/12 0254)  Weight change: 5 lb 14.2 oz (2.67 kg) Filed Weights   04/25/17 0400 04/26/17 0400 04/27/17 0254  Weight: 142 lb 8 oz (64.6 kg) 140 lb 8 oz (63.7 kg) 146 lb 6.2 oz (66.4 kg)    Intake/Output: I/O last 3 completed shifts: In: 6720 [P.O.:380; I.V.:350; Other:250; IV Piggyback:600] Out: 2340 [NOBSJ:6283; Drains:765]   Intake/Output this shift:  Total I/O In: 240 [P.O.:240] Out: 550 [Urine:300; Drains:250]  Gen:  Some mild respiratory distress with increased confusion this morning HEENT: poor oral hygiene using nasal canula CVS:  RRR, no murmur appreciated  Resp: breath sounds coarse rhonchi but improved MOQ:[id number], hypoactive bowel sounds Ext: no peripheral edema, some dependent sacral edema     Basic Metabolic Panel: Recent Labs  Lab 04/23/17 0307 04/24/17 0325 04/25/17 0258 04/26/17 0248 04/27/17 0335  NA 130* 131* 131* 131* 132*  K 3.5 3.5 3.5 3.0* 3.1*  CL 95* 95* 95* 93* 95*  CO2 25 26 25 25 24   GLUCOSE 95 87 70 108* 92  BUN 22* 26* 28* 30* 32*  CREATININE 3.42* 3.15* 3.23* 3.05* 2.93*  CALCIUM 7.6* 7.7* 7.9* 7.9* 7.7*  MG 1.6* 1.8 1.8 1.7 1.6*  PHOS 4.4 4.2 4.5 4.6 5.0*    Liver Function Tests: Recent Labs  Lab 04/23/17 0307 04/24/17 0325 04/25/17 0258 04/26/17 0248 04/27/17 0335  AST 17 18 18 18 17   ALT 10* 9* 9* 8* 8*  ALKPHOS 58 65 64 60 58  BILITOT 0.7 0.4 0.4 0.6 0.9  PROT 5.5* 5.7* 6.5 6.3* 5.9*  ALBUMIN 1.7* 1.7* 1.8* 1.7* 1.7*   No results for input(s): LIPASE, AMYLASE in the last 168 hours. Recent Labs  Lab 04/24/17 1609 04/25/17 0842 04/27/17 0335  AMMONIA 54* 39* 37*    CBC: Recent Labs  Lab 04/23/17 0307 04/24/17 0325 04/25/17 0258 04/26/17 0248 04/27/17 0335  WBC 16.6* 20.8* 19.7* 18.4* 14.4*  NEUTROABS 13.2* 16.4* 14.9* 14.2* 11.3*  HGB 6.9* 8.7* 8.1* 7.6* 7.4*  HCT 20.0* 25.5* 24.2* 22.3* 21.9*  MCV 93.9 91.1 91.7  92.1 92.0  PLT 304 345 334 336 358    Cardiac Enzymes: No results for input(s): CKTOTAL, CKMB, CKMBINDEX, TROPONINI in the last 168 hours.  BNP: Invalid input(s): POCBNP  CBG: Recent Labs  Lab 04/26/17 1547 04/26/17 1929 04/26/17 2314 04/27/17 0329 04/27/17 0747  GLUCAP 93 89 103* 95 93    Microbiology: Results for orders placed or performed during the hospital encounter of 04/11/17  MRSA PCR Screening     Status: None   Collection Time: 04/11/17  3:54 PM  Result Value Ref Range Status   MRSA by PCR NEGATIVE NEGATIVE Final    Comment:        The GeneXpert MRSA Assay (FDA approved for NASAL specimens only), is one  component of a comprehensive MRSA colonization surveillance program. It is not intended to diagnose MRSA infection nor to guide or monitor treatment for MRSA infections.   Gram stain     Status: None   Collection Time: 04/23/17  3:54 PM  Result Value Ref Range Status   Specimen Description FLUID PLEURAL  Final   Special Requests NONE  Final   Gram Stain   Final    FEW WBC PRESENT,BOTH PMN AND MONONUCLEAR NO ORGANISMS SEEN    Report Status 04/23/2017 FINAL  Final  Culture, body fluid-bottle     Status: None (Preliminary result)   Collection Time: 04/23/17  3:54 PM  Result Value Ref Range Status   Specimen Description FLUID PLEURAL  Final   Special Requests NONE  Final   Culture NO GROWTH 3 DAYS  Final   Report Status PENDING  Incomplete    Coagulation Studies: No results for input(s): LABPROT, INR in the last 72 hours.  Urinalysis: No results for input(s): COLORURINE, LABSPEC, PHURINE, GLUCOSEU, HGBUR, BILIRUBINUR, KETONESUR, PROTEINUR, UROBILINOGEN, NITRITE, LEUKOCYTESUR in the last 72 hours.  Invalid input(s): APPERANCEUR    Imaging: Dg Chest Port 1 View  Result Date: 04/27/2017 CLINICAL DATA:  56 year old male with shortness breath. Subsequent encounter. EXAM: PORTABLE CHEST 1 VIEW COMPARISON:  04/26/2017 chest x-ray. FINDINGS: Asymmetric airspace disease greater on right which may represent pulmonary edema with right-sided pleural effusion and basilar atelectasis. Limited for excluding right base infiltrate or mass given the degree of pleural fluid and atelectasis. No pneumothorax. Heart size within normal limits. IMPRESSION: Minimal change from prior examination. Persistent asymmetric airspace disease greater on right which may represent pulmonary edema with right-sided pleural effusion and basilar atelectasis. Cannot exclude right lower lobe infiltrate in this setting of pleural fluid and right base atelectasis. Electronically Signed   By: Genia Del M.D.   On:  04/27/2017 07:47   Dg Chest Port 1 View  Result Date: 04/26/2017 CLINICAL DATA:  Shortness of Breath EXAM: PORTABLE CHEST 1 VIEW COMPARISON:  April 25, 2017 and April 23, 2017 FINDINGS: Central catheter is been removed. No pneumothorax. There is widespread interstitial pulmonary edema. Patchy airspace opacity is noted bilaterally with somewhat increased consolidation medially in the right mid and lower lung zones. Patchy alveolar opacity is seen throughout the perihilar regions bilaterally, slightly more severe on the left than on the right. There are small pleural effusions bilaterally. Heart is upper normal in size with pulmonary venous hypertension. No evident adenopathy. No bone lesions. Small calcified granuloma again noted at left base. IMPRESSION: Pulmonary vascular congestion with extensive pulmonary edema and small pleural effusions. Consolidation on the right is concerning for superimposed pneumonia in the lower lung zone region. The extent of the edema overall is similar 1 day  prior. Electronically Signed   By: Lowella Grip III M.D.   On: 04/26/2017 07:10     Medications:   . sodium chloride    . sodium chloride 10 mL/hr at 04/25/17 1900  . sodium chloride    . sodium chloride    . dexmedetomidine (PRECEDEX) IV infusion Stopped (04/17/17 1700)  . piperacillin-tazobactam (ZOSYN)  IV Stopped (04/27/17 0810)  . vancomycin Stopped (04/26/17 2017)   . feeding supplement (ENSURE ENLIVE)  237 mL Oral QID  . folic acid  1 mg Oral QHS  . guaiFENesin  1,200 mg Oral BID  . heparin subcutaneous  5,000 Units Subcutaneous Q8H  . insulin aspart  0-9 Units Subcutaneous Q4H  . ipratropium-albuterol  3 mL Nebulization TID  . mouth rinse  15 mL Mouth Rinse BID  . multivitamin with minerals  1 tablet Oral Daily  . pantoprazole  40 mg Oral QHS  . potassium chloride  40 mEq Oral TID  . sodium chloride flush  10-40 mL Intracatheter Q12H  . thiamine  100 mg Oral QHS  . traZODone  50 mg  Oral QHS   sodium chloride, sodium chloride, sodium chloride, fentaNYL (SUBLIMAZE) injection, ipratropium-albuterol, metoprolol tartrate, ondansetron (ZOFRAN) IV, sodium chloride flush  Assessment/ Plan:  AKI, improving- Baseline Cr unknown.Initially on CRRT,  tolerated IHD. 11/3. Catheter removed on 11/10. Has good urine output and response to lasix although appears to be decompensating from a pulmonary standpoint.  Creatinine has continued to decrease 2.93 (3.05 on 11/11) with good urine output in the past 24 hrs. Patient has received lasix for increase pulmonary edema and kidney function has continued to improve. --Lasix prn per primary team for increase pulm edema --Discontinue Foley --F/u on am BMP, replete electrolytes as needed  Pleural effusion s/p thoracentesis Appears transudative on most recent labs although we are awaiting the results of the cytology                     Hypokalemia, resolving  replete as needed.   Anemia of chronic disease - initiated po iron,Aranesp 200 mcg q Monday  Hemoglobin has continued to decline unfortunately and will follow  VDRF -s/p extubation, increase in wbc and increase fever make me suspicious of a pneumonia process. He is to be placed on BIPAP although I fear that he will need reintubation. Agree with trial of lasix for pulmonary edema.  Diverticular abscess   s/p drain placed by IR, on Zosyn, repeat imaging per primary. Seen by surgery with recommendations for medical management and possible surgery in 6-8 weeks.  Hypophosphatemia, Resolved  Dispo: Nephrology planning to sign off tomorrow if kidney function continue to improve.   LOS: 16 Kaymen Adrian @TODAY @11 :40 AM

## 2017-04-27 NOTE — Progress Notes (Signed)
Inpatient Rehabilitation  Continuing to follow.  Note that patient is occassionally requiring BIPAP.  Prior to, on 11/9 he demonstrated good therapy tolerance.  Will continue to follow for updated therapy notes as well as medical stability.  Please call with questions.   Carmelia Roller., CCC/SLP Admission Coordinator  [place]  Cell [phone]

## 2017-04-27 NOTE — Progress Notes (Signed)
Placed patient on BIPAP per patient request. Patient has increased WOB. Previous settings on 10/5/

## 2017-04-28 ENCOUNTER — Inpatient Hospital Stay (HOSPITAL_COMMUNITY): Payer: Medicaid Other

## 2017-04-28 DIAGNOSIS — J449 Chronic obstructive pulmonary disease, unspecified: Secondary | ICD-10-CM

## 2017-04-28 DIAGNOSIS — Z452 Encounter for adjustment and management of vascular access device: Secondary | ICD-10-CM

## 2017-04-28 DIAGNOSIS — T17908S Unspecified foreign body in respiratory tract, part unspecified causing other injury, sequela: Secondary | ICD-10-CM

## 2017-04-28 LAB — COMPREHENSIVE METABOLIC PANEL
ALT: 9 U/L — AB (ref 17–63)
AST: 19 U/L (ref 15–41)
Albumin: 1.8 g/dL — ABNORMAL LOW (ref 3.5–5.0)
Alkaline Phosphatase: 69 U/L (ref 38–126)
Anion gap: 11 (ref 5–15)
BUN: 34 mg/dL — AB (ref 6–20)
CHLORIDE: 97 mmol/L — AB (ref 101–111)
CO2: 25 mmol/L (ref 22–32)
CREATININE: 2.75 mg/dL — AB (ref 0.61–1.24)
Calcium: 8.1 mg/dL — ABNORMAL LOW (ref 8.9–10.3)
GFR calc Af Amer: 28 mL/min — ABNORMAL LOW (ref >60)
GFR calc non Af Amer: 25 mL/min — ABNORMAL LOW (ref >60)
Glucose, Bld: 80 mg/dL (ref 65–99)
Potassium: 3.8 mmol/L (ref 3.5–5.1)
SODIUM: 133 mmol/L — AB (ref 135–145)
Total Bilirubin: 0.7 mg/dL (ref 0.3–1.2)
Total Protein: 6.5 g/dL (ref 6.5–8.1)

## 2017-04-28 LAB — CULTURE, BODY FLUID-BOTTLE

## 2017-04-28 LAB — CBC WITH DIFFERENTIAL/PLATELET
Basophils Absolute: 0.1 10*3/uL (ref 0.0–0.1)
Basophils Relative: 1 %
Eosinophils Absolute: 0.3 10*3/uL (ref 0.0–0.7)
Eosinophils Relative: 2 %
HCT: 22.1 % — ABNORMAL LOW (ref 39.0–52.0)
Hemoglobin: 7.4 g/dL — ABNORMAL LOW (ref 13.0–17.0)
Lymphocytes Relative: 14 %
Lymphs Abs: 2 10*3/uL (ref 0.7–4.0)
MCH: 31 pg (ref 26.0–34.0)
MCHC: 33.5 g/dL (ref 30.0–36.0)
MCV: 92.5 fL (ref 78.0–100.0)
Monocytes Absolute: 1.6 10*3/uL — ABNORMAL HIGH (ref 0.1–1.0)
Monocytes Relative: 11 %
Neutro Abs: 10.5 10*3/uL — ABNORMAL HIGH (ref 1.7–7.7)
Neutrophils Relative %: 72 %
Platelets: 381 10*3/uL (ref 150–400)
RBC: 2.39 MIL/uL — ABNORMAL LOW (ref 4.22–5.81)
RDW: 18 % — ABNORMAL HIGH (ref 11.5–15.5)
WBC: 14.4 10*3/uL — ABNORMAL HIGH (ref 4.0–10.5)

## 2017-04-28 LAB — GLUCOSE, CAPILLARY
GLUCOSE-CAPILLARY: 86 mg/dL (ref 65–99)
GLUCOSE-CAPILLARY: 93 mg/dL (ref 65–99)
GLUCOSE-CAPILLARY: 115 mg/dL — AB (ref 65–99)
Glucose-Capillary: 117 mg/dL — ABNORMAL HIGH (ref 65–99)
Glucose-Capillary: 121 mg/dL — ABNORMAL HIGH (ref 65–99)
Glucose-Capillary: 126 mg/dL — ABNORMAL HIGH (ref 65–99)
Glucose-Capillary: 132 mg/dL — ABNORMAL HIGH (ref 65–99)

## 2017-04-28 LAB — PHOSPHORUS: PHOSPHORUS: 3.9 mg/dL (ref 2.5–4.6)

## 2017-04-28 LAB — CULTURE, BODY FLUID W GRAM STAIN -BOTTLE: Culture: NO GROWTH

## 2017-04-28 LAB — MAGNESIUM: Magnesium: 1.8 mg/dL (ref 1.7–2.4)

## 2017-04-28 MED ORDER — VANCOMYCIN HCL IN DEXTROSE 750-5 MG/150ML-% IV SOLN
750.0000 mg | INTRAVENOUS | Status: DC
  Administered 2017-04-28 – 2017-04-30 (×3): 750 mg via INTRAVENOUS
  Filled 2017-04-28 (×4): qty 150

## 2017-04-28 MED ORDER — FUROSEMIDE 10 MG/ML IJ SOLN
60.0000 mg | Freq: Two times a day (BID) | INTRAMUSCULAR | Status: AC
  Administered 2017-04-28 (×2): 60 mg via INTRAVENOUS
  Filled 2017-04-28 (×2): qty 6

## 2017-04-28 NOTE — Progress Notes (Signed)
Patient has been confused most of the afternoon. He has been pulling off telemetry leads and getting naked. At one point patient was paranoid and asked me to stay in his room with him and watch what everyone was doing outside. Continue to monitor, support, and reorient patient.

## 2017-04-28 NOTE — Progress Notes (Signed)
Inpatient Rehabilitation  Continuing to follow along for timing of medical readiness, therapy tolerance, and IP Rehab bed availability.  Call if questions.   Carmelia Roller., CCC/SLP Admission Coordinator  [place]  Cell [phone]

## 2017-04-28 NOTE — Progress Notes (Signed)
[facility] Progress Note     Subjective: CC: No new complaints Still requiring BiPAP. Breathing was feeling better until he got breathing treatment which makes him feel raspy. Abdomen feels bloated. Got rid of foley catheter yesterday.  UOP good. VSS, although tachycardic.  Objective: Vital signs in last 24 hours: Temp:  [98.2 F (36.8 C)-98.9 F (37.2 C)] 98.6 F (37 C) (11/13 0720) Pulse Rate:  [111-127] 114 (11/13 0720) Resp:  [16-32] 24 (11/13 0720) BP: (112-134)/(84-98) 122/91 (11/13 0720) SpO2:  [90 %-99 %] 92 % (11/13 0720) FiO2 (%):  [40 %] 40 % (11/12 2218) Weight:  [65.1 kg (143 lb 8.3 oz)] 65.1 kg (143 lb 8.3 oz) (11/13 0500) Last BM Date: 04/27/17  Intake/Output from previous day: 11/12 0701 - 11/13 0700 In: 1037.7 [P.O.:720; I.V.:217.7; IV Piggyback:100] Out: 1050 [Urine:800; Drains:250] Intake/Output this shift: No intake/output data recorded.  PE: Gen:  Alert, NAD, pleasant Card:  Regular rhythm, tachycardic, pedal pulses 2+ BL Abd: Soft, non-tender, distended, bowel sounds present, feculent material in drainage bag Skin: warm and dry, no rashes  Psych: A&Ox3   Lab Results:  Recent Labs    04/27/17 0335 04/28/17 0209  WBC 14.4* 14.4*  HGB 7.4* 7.4*  HCT 21.9* 22.1*  PLT 358 381   BMET Recent Labs    04/27/17 0335 04/28/17 0209  NA 132* 133*  K 3.1* 3.8  CL 95* 97*  CO2 24 25  GLUCOSE 92 80  BUN 32* 34*  CREATININE 2.93* 2.75*  CALCIUM 7.7* 8.1*   PT/INR No results for input(s): LABPROT, INR in the last 72 hours. CMP     Component Value Date/Time   NA 133 (L) 04/28/2017 0209   K 3.8 04/28/2017 0209   CL 97 (L) 04/28/2017 0209   CO2 25 04/28/2017 0209   GLUCOSE 80 04/28/2017 0209   BUN 34 (H) 04/28/2017 0209   CREATININE 2.75 (H) 04/28/2017 0209   CALCIUM 8.1 (L) 04/28/2017 0209   PROT 6.5 04/28/2017 0209   ALBUMIN 1.8 (L) 04/28/2017 0209   AST 19 04/28/2017 0209   ALT 9 (L) 04/28/2017 0209   ALKPHOS 69  04/28/2017 0209   BILITOT 0.7 04/28/2017 0209   GFRNONAA 25 (L) 04/28/2017 0209   GFRAA 28 (L) 04/28/2017 0209   Lipase  No results found for: LIPASE     Studies/Results: Dg Chest Port 1 View  Result Date: 04/27/2017 CLINICAL DATA:  56 year old male with shortness breath. Subsequent encounter. EXAM: PORTABLE CHEST 1 VIEW COMPARISON:  04/26/2017 chest x-ray. FINDINGS: Asymmetric airspace disease greater on right which may represent pulmonary edema with right-sided pleural effusion and basilar atelectasis. Limited for excluding right base infiltrate or mass given the degree of pleural fluid and atelectasis. No pneumothorax. Heart size within normal limits. IMPRESSION: Minimal change from prior examination. Persistent asymmetric airspace disease greater on right which may represent pulmonary edema with right-sided pleural effusion and basilar atelectasis. Cannot exclude right lower lobe infiltrate in this setting of pleural fluid and right base atelectasis. Electronically Signed   By: Genia Del M.D.   On: 04/27/2017 07:47    Anti-infectives: Anti-infectives (From admission, onward)   Start     Dose/Rate Route Frequency Ordered Stop   04/27/17 1400  piperacillin-tazobactam (ZOSYN) IVPB 3.375 g     3.375 g 12.5 mL/hr over 240 Minutes Intravenous Every 8 hours 04/27/17 1158     04/26/17 1830  vancomycin (VANCOCIN) 1,250 mg in sodium chloride 0.9 % 250 mL IVPB  1,250 mg 166.7 mL/hr over 90 Minutes Intravenous Every 48 hours 04/26/17 1739     04/24/17 1700  vancomycin (VANCOCIN) IVPB 1000 mg/200 mL premix  Status:  Discontinued     1,000 mg 200 mL/hr over 60 Minutes Intravenous  Once 04/24/17 1652 04/24/17 1656   04/24/17 1700  vancomycin (VANCOCIN) IVPB 1000 mg/200 mL premix  Status:  Discontinued     1,000 mg 200 mL/hr over 60 Minutes Intravenous Every 48 hours 04/24/17 1656 04/26/17 1739   04/23/17 1500  piperacillin-tazobactam (ZOSYN) IVPB 3.375 g  Status:  Discontinued     3.375  g 12.5 mL/hr over 240 Minutes Intravenous Every 12 hours 04/23/17 1107 04/27/17 1158   04/18/17 1800  piperacillin-tazobactam (ZOSYN) IVPB 3.375 g  Status:  Discontinued     3.375 g 12.5 mL/hr over 240 Minutes Intravenous Every 12 hours 04/18/17 1032 04/23/17 1107   04/18/17 1400  piperacillin-tazobactam (ZOSYN) IVPB 3.375 g  Status:  Discontinued     3.375 g 12.5 mL/hr over 240 Minutes Intravenous Every 8 hours 04/18/17 1031 04/18/17 1032   04/17/17 1200  piperacillin-tazobactam (ZOSYN) IVPB 3.375 g  Status:  Discontinued     3.375 g 100 mL/hr over 30 Minutes Intravenous Every 6 hours 04/17/17 1030 04/18/17 1031   04/17/17 1100  piperacillin-tazobactam (ZOSYN) IVPB 3.375 g  Status:  Discontinued     3.375 g 12.5 mL/hr over 240 Minutes Intravenous Every 8 hours 04/17/17 1019 04/17/17 1030   04/15/17 2125  Ampicillin-Sulbactam (UNASYN) 3 g in sodium chloride 0.9 % 100 mL IVPB  Status:  Discontinued     3 g 200 mL/hr over 30 Minutes Intravenous Every 8 hours 04/15/17 1529 04/17/17 1019   04/15/17 1100  ampicillin-sulbactam (UNASYN) 1.5 g in sodium chloride 0.9 % 50 mL IVPB  Status:  Discontinued     1.5 g 100 mL/hr over 30 Minutes Intravenous Every 12 hours 04/15/17 0937 04/15/17 1529   04/13/17 2200  levofloxacin (LEVAQUIN) IVPB 500 mg  Status:  Discontinued     500 mg 100 mL/hr over 60 Minutes Intravenous Every 48 hours 04/12/17 0114 04/12/17 0846   04/13/17 1800  piperacillin-tazobactam (ZOSYN) IVPB 2.25 g  Status:  Discontinued     2.25 g 100 mL/hr over 30 Minutes Intravenous Every 8 hours 04/13/17 1050 04/15/17 0937   04/12/17 0200  piperacillin-tazobactam (ZOSYN) IVPB 3.375 g  Status:  Discontinued     3.375 g 12.5 mL/hr over 240 Minutes Intravenous Every 8 hours 04/12/17 0103 04/13/17 1050   04/12/17 0000  levofloxacin (LEVAQUIN) IVPB 750 mg  Status:  Discontinued     750 mg 100 mL/hr over 90 Minutes Intravenous Every 48 hours 04/11/17 2230 04/12/17 0114        Assessment/Plan Pulmonary edema Pleural effusion s/p thoracentesis AKI - Cr 2.75, BUN 34; slowly improving Anemia of chronic disease Hypokalemia - K 3.8, improving  Diverticulitis with perforation s/p IR drain - Repeat CT 04/19/17: no significant residual abscess around drain, 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 Stable small bowel dilation. - Afebrile, WBC stable  - Drain250cc/24h, continue drain - Having bowel function(watery stool in rectal tube)and pain improving - Continue IV abx - continue therapies -Long term plan would be to control leak and keep on abx. Hopefully have definitive surgery in 6-8 weeks, possibly with Dr. Lilia Pro at [facility] or Dr. Rosendo Gros here. Will need to increase PO intake/protein intake.   FEN: HH/carb mod; prealbumin 7.9 on 11/8 - continue ensure to TID. Encourage 5-6 small meals daily. ID: Unasyn  10/31-11/2, Zosyn 10/28  VTE: SCD's, SQ heparin    LOS: 17 days    Brigid Re , [facility] Surgery 04/28/2017, 7:41 AM Pager: [phone] Consults: [phone] Mon-Fri 7:00 am-4:30 pm Sat-Sun 7:00 am-11:30 am

## 2017-04-28 NOTE — Progress Notes (Signed)
ANTIBIOTIC CONSULT NOTE - INITIAL  Pharmacy Consult for Vanco/Zpsyn Indication: diverticular abscess  No Known Allergies  Patient Measurements: Height: 5\' 11"  (180.3 cm) Weight: 143 lb 8.3 oz (65.1 kg) IBW/kg (Calculated) : 75.3 Adjusted Body Weight:    Vital Signs: Temp: 98.6 F (37 C) (11/13 0720) Temp Source: Oral (11/13 0720) BP: 122/91 (11/13 0720) Pulse Rate: 114 (11/13 0720) Intake/Output from previous day: 11/12 0701 - 11/13 0700 In: 1037.7 [P.O.:720; I.V.:217.7; IV Piggyback:100] Out: 1050 [Urine:800; Drains:250] Intake/Output from this shift: Total I/O In: 240 [P.O.:240] Out: -   Labs: Recent Labs    04/26/17 0248 04/27/17 0335 04/28/17 0209  WBC 18.4* 14.4* 14.4*  HGB 7.6* 7.4* 7.4*  PLT 336 358 381  CREATININE 3.05* 2.93* 2.75*   Estimated Creatinine Clearance: 28.3 mL/min (A) (by C-G formula based on SCr of 2.75 mg/dL (H)). Recent Labs    04/26/17 1542  VANCORANDOM 10      Assessment: ID: abx for diverticular abscess with E.coli + anaerobes.  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  afeb, wbc 18.4>14.4 declining. Scr 2.75 down slightly.  Zosyn 10/28 >> 10/31; 11/2 >> Unasyn 10/31 >>11/2 Vancomycin 11/9 >   11/11- vanc random= 10 at ~4pm (last dose was 1000mg  at ~ 6pm on 11/9)  10/27 MRSA PCR - negative 10/19 OSH: Intra-op culture: Ecoli (pan sensitive), Finegoldia magna (formerly peptostreptococcus), Staph saccharolyticus, and Prevotella loescheii 11/8 :Pleural Fluid x 2 > ngtd  Goal of Therapy:  Vancomycin trough level 15-20 mcg/ml  Plan:   Zosyn 3.375g IV q 8hr increased  Vanco change to 750mg  IV q 24h (11/13)   Dominic Phillips, PharmD, BCPS Clinical Staff Pharmacist Pager [phone]  Dominic Phillips 04/28/2017,2:30 PM

## 2017-04-28 NOTE — Progress Notes (Signed)
Pt noted to be be pulling at abdominal drain.  Drain noted to be continuing to drain brown liquid drainage.  Site around drain red but no drainage noted.  Abdomen soft but distended.  Tender to palpation.  Dr. Rosendo Gros with [facility] Surgery Notified.  Order received for abdominal binder.

## 2017-04-28 NOTE — Progress Notes (Signed)
PROGRESS NOTE    Dominic Phillips  XBJ:[id number] DOB: 12-16-1960 DOA: 04/11/2017 PCP: No primary care provider on file.   Brief Narrative:  Dominic Phillips is a 56 y.o. 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 Transferred from [facility] for management of ARDS and progressive renal insufficiency. This gentleman has a long history of heavy ETOH abuse (~ 18 beers per day) and presented to [facility] on 04/02/2017 with a c/o of abdominal pain without fevers, leukocytosis or diarrhea. An abd CT revealed pericolonic inflammation/fluid collection, for which he was admitted for diverticular abscess. Was consulted by Gen Surg who suggested placement of a catheter by interventional radiology; subsequent cultures grew E. Coli, for which Zosyn was initiated. Hospital course was complicated by the development of DTs, treated with 1 beer tid and Ativan prn. He slowly improved but worsened on 10/23 with increased WBC and progressive hypoxemia. Repeat abdominal CT showed improving abscess but developing SBO and multilobar pneumonia. Hypoxemia could not be corrected with BiPAP, along with worsening renal function such that he was intubated on 10/25 and required pressors for hemodynamic support. CT chest/abdomen/pelvis on 10/26 showed bilateral pneumonia with volume overload; no change in the RLQ percutaneous drain catheter. With rising creat to 3.1 and 290 cc U/O over 24 hours, transfer was requested.   He required intubation from 10/27 to 11/2 for ARDS and aspiration pneumonia and pressors for circulatory shock in ICU. He was receiving CRRT for AKI and has tolerated IHD x1. Worsening leukocytosis possibly secondary abdominal abscess. Pleural effusion also worsened so patient underwent Thoracentesis 11/8. Blood Count dropped so he was given 1 unit of pRBC's. Nephrology giving 120 mg of IV Lasix yesterday.   On the night of 11/08-11/09 patient had a coughing episode and vomited and  aspirated. He was a little confused. Throughout the course of the day he remained slightly confused so an ABG was obtained and he was Hypoxemic so his O2 was increased to 15 Liters and repeat ABG ordered and was normal. Continue to Wean O2. Abx broadened and Vanc added. Patient had breathing treatments added today and was going try to be weaned down on his O2.  Yesterday, 11/12 the patient desaturated to the 80's and was placed on 15 Liters of HFNC with no real improvement and was eventually switched to NRB and then BiPAP with improvement. Patient was given 120 mg of IV Lasix as he sounded wet. Patient remains intermittently confused and likely from Hypoxemia. Patient improved respiratory wise and was placed back on 15 HFNC.   Foley Catheter has been removed and Nephrology has signed off. Patient was given IV Lasix 60 mg BID today.   Assessment & Plan:   Active Problems:   Acute respiratory distress syndrome (ARDS) (HCC)   Acute kidney injury ([place])   Pressure injury of skin   Colonic diverticular abscess   COPD (chronic obstructive pulmonary disease) (HCC)   Dyspnea   HCAP (healthcare-associated pneumonia)   Acute blood loss anemia   Anemia of chronic disease   ETOH abuse   Tobacco abuse   Tachycardia   Tachypnea   Leukocytosis   Post-operative pain  Acute respiratory distress syndrome/VDRF s/p Extubation 11/2 now requiring NIPPV with BiPAP, improved slightly  -Seen on chest x-ray but possibly secondary to volume overload in setting of acute kidney injury Patient required intubation from 10/27 to 11/2.  -C/w Supplemental O2 via [place] and had to increase to 15 Liters HFNC and then place on  BiPAP eventually; Now back on 15 Liters -ABG done today showed 7.368/42.5/114/23.9/98.4% on 100% NRB -Ordered Thoracentesis and was done and drained 1.3 Liters -Repeat CXR yesterday 11/9 showed Small bilateral effusions Interval worsening of diffuse interstitial and alveolar opacity consistent with  worsening edema or diffuse pneumonia. -CXR 11/10 AM showed Pulmonary vascular congestion. Diffuse interstitial pulmonary edema with patchy alveolar pulmonary edema bilaterally. Superimposed pneumonia, particularly in the right mid to lower lung zone, cannot be excluded. Appearance similar to 1 day prior, although somewhat deeper degree of inspiration on current examination. No new opacity appreciable. Small granuloma left base, stable  -CXR 11/11 AM showed Pulmonary vascular congestion with extensive pulmonary edema and small pleural effusions. Consolidation on the right is concerning for superimposed pneumonia in the lower lung zone region. The extent of the edema overall is similar 1 day prior. -C/w IV Vancomycin and IV Zosyn  -Given IV Lasix 60 mg BID today  -Repeat CXR 11/12 showed Minimal change from prior examination. Persistent asymmetric airspace disease greater on right which may represent pulmonary edema with right-sided pleural effusion and basilar atelectasis. Cannot exclude right lower lobe infiltrate in this setting of pleural fluid and right base atelectasis. -CXR 11/13 showed Stable diffuse interstitial densities are noted throughout both lungs concerning for pulmonary edema. Mild right basilar atelectasis or infiltrate is noted with minimal right pleural effusion. -May need to consider Palliative Consultation  Bilateral pleural effusions with Right worse than Left, improved -Patient still on oxygen. No respiratory distress -Nephrology recommendations appreciated -Thoracentesis done and drained 1.3 Liters -Follow up on Fluid Studies  -Repeat CXR showed successful Right Sided Thoracentesis. No more than a small amount of Pleural Fluid Remains. No post-procedure Pneumothorax -CXR today showed Pulmonary Vascular Congestion and Diffuse interstitial Pulmonary Edema with patchy alveolar edema bilaterally. Superimposed PNA in the Right to Mid to Lower Lung Zone could not be excluded.    -Tried 120 mg of IV Lasix this AM and 60 mg of IV Lasix on 11/11. Gave 60 mg in AM on 11/12. Will given IV 60 mg BID   Aspiration Pneumonia / HCAP -Treated with Zosyn. Improved. Course completed. Mild sputum production which has improved. Chest x-ray with ?progression. -Likely more secondary to effusion. -Aspirated again yesterday so added IV Vanc to broaden Coverage. Continue IV Zosyn that was  -Add Incentive Spirometry and Flutter Valve -Added DuoNeb Scheduled and Mucinex -Repeat SLP ok and showed that patient is mild Aspiration risk  -WBC now 14.4  Diverticular Abscess -S/p drain. On zosyn per ID. Leukocytosis worsened yesterday but slightly improved. CT scan shows minimally improved abscess that is not communicating with drain. -General surgery recommendations appreciated; Plan for now is continue Drain and Abx for perforation and long term plan is to control leak and keep on Abx and have definitive Surgery in 6-8 weeks -Pert General Surgery will need to increase po intake/protein intake so they are checking Pre-Albumin. Will consult Nutritionist  -Pre-Albuminm Low so Nutrition consulted and recommending changing Ensure to QID  -Continue IV Zosyn; Added IV Vancomycin  -WBC now 14.4  Abdominal wound drainage -Per general surgery recommendations as above  Leukocytosis -Possibly secondary to abdominal abscess and likley PNA. Afebrile. -trend CBC -management per above; WBC went from 17.4 -> 15.1 -> 16.6 -> 20.8 -> 19.7 -> 18.4 -> 14.4  Acute Kidney Injury, -Patient initially with oliguria requiring CRRT. Had IHD on 11/3 without issue -nephrology recommendations: IHD -BUN/Cr went from 21/3.24 -> 22/3.31 -> 22/3.42 -> 26/3.15 -> 28/3.23 -> 30/3.05 ->  32/2.93 -> 34/2.75 -Tried 120 mg of IV Lasix this AM and 60 mg of IV Lasix on 11/11. Gave 60 mg in AM on 11/12. Will given IV 60 mg BID and re-evaluate in AM -Continue to Monitor and no dialysis indicated today and Nephrology states  foley can be removed  -Temp HD Cath removed   COPD -Patient states he has quit smoking. Mild cough -continue Duoneb Scheduled and PRN -Added Mucinex -As Above  Circulatory shock -Patient required Levophed and was weaned off on 04/17/17. -Normotensive. -Continue to Monitor Hemodynamic Status   Ethanol Abuse -Out of window for Withdrawals.  Hypokalemia -Supplement with oral potassium 40 mEQ TID -K+ now 3.8 -Continue to Monitor and Replete As Necessary -Repeat CMP in AM  Insomnia -Continue Trazodone -Discontinue Ambien  Normocytic Anemia  -Hb/Hct trended down from 7.7/23.3 -> 7.5/21.9 -> 6.9/20.0 -Transfuse 1 unit of pRBC's and then blood count improved to 8.7/25.5 and now trending back down as went from 8.1/24.2 -> 7.6/22.3 -> 7.4/21.9 -> 7.4/22.1 -Continue to Monitor for S/Sx of bleeding -Repeat CBC in AM   Hypomagnesemia -Patient's Mag Level was 1.6 and improved to 1.8 -Replete with IV Mag Sulfate 1 gram  -Continue to Monitor and Replete as Necessary -Repeat Mag Level in AM   Hyperammonemia -Ammonia Level slightly High at 54, Repeat was 37 -Repeat Ammonia Level in AM and if higher or worse add Laculose   DVT prophylaxis: Heparin 5,000 units sq q8h Code Status: Partial Code, DNI Family Communication: No family present at bedside Disposition Plan: Remain Inpatient as he continues to be treated medically  Consultants:   General Surgery  PCCM  Nephrology  CIR   Procedures:  CRRT IHD   Antimicrobials: Anti-infectives (From admission, onward)   Start     Dose/Rate Route Frequency Ordered Stop   04/28/17 1530  vancomycin (VANCOCIN) IVPB 750 mg/150 ml premix     750 mg 150 mL/hr over 60 Minutes Intravenous Every 24 hours 04/28/17 1424     04/27/17 1400  piperacillin-tazobactam (ZOSYN) IVPB 3.375 g     3.375 g 12.5 mL/hr over 240 Minutes Intravenous Every 8 hours 04/27/17 1158     04/26/17 1830  vancomycin (VANCOCIN) 1,250 mg in sodium chloride 0.9  % 250 mL IVPB  Status:  Discontinued     1,250 mg 166.7 mL/hr over 90 Minutes Intravenous Every 48 hours 04/26/17 1739 04/28/17 1424   04/24/17 1700  vancomycin (VANCOCIN) IVPB 1000 mg/200 mL premix  Status:  Discontinued     1,000 mg 200 mL/hr over 60 Minutes Intravenous  Once 04/24/17 1652 04/24/17 1656   04/24/17 1700  vancomycin (VANCOCIN) IVPB 1000 mg/200 mL premix  Status:  Discontinued     1,000 mg 200 mL/hr over 60 Minutes Intravenous Every 48 hours 04/24/17 1656 04/26/17 1739   04/23/17 1500  piperacillin-tazobactam (ZOSYN) IVPB 3.375 g  Status:  Discontinued     3.375 g 12.5 mL/hr over 240 Minutes Intravenous Every 12 hours 04/23/17 1107 04/27/17 1158   04/18/17 1800  piperacillin-tazobactam (ZOSYN) IVPB 3.375 g  Status:  Discontinued     3.375 g 12.5 mL/hr over 240 Minutes Intravenous Every 12 hours 04/18/17 1032 04/23/17 1107   04/18/17 1400  piperacillin-tazobactam (ZOSYN) IVPB 3.375 g  Status:  Discontinued     3.375 g 12.5 mL/hr over 240 Minutes Intravenous Every 8 hours 04/18/17 1031 04/18/17 1032   04/17/17 1200  piperacillin-tazobactam (ZOSYN) IVPB 3.375 g  Status:  Discontinued     3.375 g 100 mL/hr  over 30 Minutes Intravenous Every 6 hours 04/17/17 1030 04/18/17 1031   04/17/17 1100  piperacillin-tazobactam (ZOSYN) IVPB 3.375 g  Status:  Discontinued     3.375 g 12.5 mL/hr over 240 Minutes Intravenous Every 8 hours 04/17/17 1019 04/17/17 1030   04/15/17 2125  Ampicillin-Sulbactam (UNASYN) 3 g in sodium chloride 0.9 % 100 mL IVPB  Status:  Discontinued     3 g 200 mL/hr over 30 Minutes Intravenous Every 8 hours 04/15/17 1529 04/17/17 1019   04/15/17 1100  ampicillin-sulbactam (UNASYN) 1.5 g in sodium chloride 0.9 % 50 mL IVPB  Status:  Discontinued     1.5 g 100 mL/hr over 30 Minutes Intravenous Every 12 hours 04/15/17 0937 04/15/17 1529   04/13/17 2200  levofloxacin (LEVAQUIN) IVPB 500 mg  Status:  Discontinued     500 mg 100 mL/hr over 60 Minutes Intravenous Every  48 hours 04/12/17 0114 04/12/17 0846   04/13/17 1800  piperacillin-tazobactam (ZOSYN) IVPB 2.25 g  Status:  Discontinued     2.25 g 100 mL/hr over 30 Minutes Intravenous Every 8 hours 04/13/17 1050 04/15/17 0937   04/12/17 0200  piperacillin-tazobactam (ZOSYN) IVPB 3.375 g  Status:  Discontinued     3.375 g 12.5 mL/hr over 240 Minutes Intravenous Every 8 hours 04/12/17 0103 04/13/17 1050   04/12/17 0000  levofloxacin (LEVAQUIN) IVPB 750 mg  Status:  Discontinued     750 mg 100 mL/hr over 90 Minutes Intravenous Every 48 hours 04/11/17 2230 04/12/17 0114     Subjective: Seen and examined and stated he was having some belly pain. Was happy his foley was out. Was slightly confused this AM.   Objective: Vitals:   04/28/17 1419 04/28/17 1817 04/28/17 1927 04/28/17 2008  BP:  112/87 121/84   Pulse:      Resp:  (!) 25 (!) 39   Temp:  98.7 F (37.1 C) 98.8 F (37.1 C)   TempSrc:  Oral Oral   SpO2: 98%   97%  Weight:      Height:        Intake/Output Summary (Last 24 hours) at 04/28/2017 2131 Last data filed at 04/28/2017 1700 Gross per 24 hour  Intake 760 ml  Output -  Net 760 ml   Filed Weights   04/26/17 0400 04/27/17 0254 04/28/17 0500  Weight: 63.7 kg (140 lb 8 oz) 66.4 kg (146 lb 6.2 oz) 65.1 kg (143 lb 8.3 oz)   Examination: Physical Exam:  Constitutional: Thin Caucasian male in NAD. Appears confused this AM ENMT: External ears and nose appear normal. MMM Neck: Supple with no JVD Respiratory: Diminished with rhonchi and diffuse crackles. Tacypenic and keeps desaturating with O2 coming off Cardiovascular: Tachycardic rate but regular rhythm. No appreciable w Abdomen: Soft, Tender to palpate.  GU: Deferred. Foley removed. Dignisheild in place Musculoskeletal: No contractures. No cyanosis Skin: Warm and Dry. No rashes or lesions on a limited skin eval Neurologic: CN 2-12 grossly intact. No appreciable focal deficits Psychiatric: Impaired judgement and insight.  Confused. Awake and keeps removing Supplemental O2 because of confusion  Data Reviewed: I have personally reviewed following labs and imaging studies  CBC: Recent Labs  Lab 04/24/17 0325 04/25/17 0258 04/26/17 0248 04/27/17 0335 04/28/17 0209  WBC 20.8* 19.7* 18.4* 14.4* 14.4*  NEUTROABS 16.4* 14.9* 14.2* 11.3* 10.5*  HGB 8.7* 8.1* 7.6* 7.4* 7.4*  HCT 25.5* 24.2* 22.3* 21.9* 22.1*  MCV 91.1 91.7 92.1 92.0 92.5  PLT 345 334 336 358 381   Basic  Metabolic Panel: Recent Labs  Lab 04/24/17 0325 04/25/17 0258 04/26/17 0248 04/27/17 0335 04/28/17 0209  NA 131* 131* 131* 132* 133*  K 3.5 3.5 3.0* 3.1* 3.8  CL 95* 95* 93* 95* 97*  CO2 26 25 25 24 25   GLUCOSE 87 70 108* 92 80  BUN 26* 28* 30* 32* 34*  CREATININE 3.15* 3.23* 3.05* 2.93* 2.75*  CALCIUM 7.7* 7.9* 7.9* 7.7* 8.1*  MG 1.8 1.8 1.7 1.6* 1.8  PHOS 4.2 4.5 4.6 5.0* 3.9   GFR: Estimated Creatinine Clearance: 28.3 mL/min (A) (by C-G formula based on SCr of 2.75 mg/dL (H)). Liver Function Tests: Recent Labs  Lab 04/24/17 0325 04/25/17 0258 04/26/17 0248 04/27/17 0335 04/28/17 0209  AST 18 18 18 17 19   ALT 9* 9* 8* 8* 9*  ALKPHOS 65 64 60 58 69  BILITOT 0.4 0.4 0.6 0.9 0.7  PROT 5.7* 6.5 6.3* 5.9* 6.5  ALBUMIN 1.7* 1.8* 1.7* 1.7* 1.8*   No results for input(s): LIPASE, AMYLASE in the last 168 hours. Recent Labs  Lab 04/24/17 1609 04/25/17 0842 04/27/17 0335  AMMONIA 54* 39* 37*   Coagulation Profile: No results for input(s): INR, PROTIME in the last 168 hours. Cardiac Enzymes: No results for input(s): CKTOTAL, CKMB, CKMBINDEX, TROPONINI in the last 168 hours. BNP (last 3 results) No results for input(s): PROBNP in the last 8760 hours. HbA1C: No results for input(s): HGBA1C in the last 72 hours. CBG: Recent Labs  Lab 04/28/17 0404 04/28/17 0739 04/28/17 1202 04/28/17 1625 04/28/17 1927  GLUCAP 86 93 115* 117* 126*   Lipid Profile: No results for input(s): CHOL, HDL, LDLCALC, TRIG, CHOLHDL,  LDLDIRECT in the last 72 hours. Thyroid Function Tests: No results for input(s): TSH, T4TOTAL, FREET4, T3FREE, THYROIDAB in the last 72 hours. Anemia Panel: No results for input(s): VITAMINB12, FOLATE, FERRITIN, TIBC, IRON, RETICCTPCT in the last 72 hours. Sepsis Labs: No results for input(s): PROCALCITON, LATICACIDVEN in the last 168 hours.  Recent Results (from the past 240 hour(s))  Gram stain     Status: None   Collection Time: 04/23/17  3:54 PM  Result Value Ref Range Status   Specimen Description FLUID PLEURAL  Final   Special Requests NONE  Final   Gram Stain   Final    FEW WBC PRESENT,BOTH PMN AND MONONUCLEAR NO ORGANISMS SEEN    Report Status 04/23/2017 FINAL  Final  Culture, body fluid-bottle     Status: None   Collection Time: 04/23/17  3:54 PM  Result Value Ref Range Status   Specimen Description FLUID PLEURAL  Final   Special Requests NONE  Final   Culture NO GROWTH 5 DAYS  Final   Report Status 04/28/2017 FINAL  Final     Radiology Studies: Dg Chest Port 1 View  Result Date: 04/28/2017 CLINICAL DATA:  Shortness of breath. EXAM: PORTABLE CHEST 1 VIEW COMPARISON:  Radiograph of April 27, 2017. FINDINGS: Stable cardiomediastinal silhouette. Stable diffuse interstitial densities are noted throughout both lungs most consistent with pulmonary edema. No pneumothorax is noted. Mild right basilar atelectasis or infiltrate is noted. Minimal right pleural effusion cannot be excluded. Bony thorax is unremarkable. IMPRESSION: Stable diffuse interstitial densities are noted throughout both lungs concerning for pulmonary edema. Mild right basilar atelectasis or infiltrate is noted with minimal right pleural effusion. Electronically Signed   By: Marijo Conception, M.D.   On: 04/28/2017 08:06   Dg Chest Port 1 View  Result Date: 04/27/2017 CLINICAL DATA:  56 year old male with shortness  breath. Subsequent encounter. EXAM: PORTABLE CHEST 1 VIEW COMPARISON:  04/26/2017 chest x-ray.  FINDINGS: Asymmetric airspace disease greater on right which may represent pulmonary edema with right-sided pleural effusion and basilar atelectasis. Limited for excluding right base infiltrate or mass given the degree of pleural fluid and atelectasis. No pneumothorax. Heart size within normal limits. IMPRESSION: Minimal change from prior examination. Persistent asymmetric airspace disease greater on right which may represent pulmonary edema with right-sided pleural effusion and basilar atelectasis. Cannot exclude right lower lobe infiltrate in this setting of pleural fluid and right base atelectasis. Electronically Signed   By: Genia Del M.D.   On: 04/27/2017 07:47   Scheduled Meds: . feeding supplement (ENSURE ENLIVE)  237 mL Oral QID  . folic acid  1 mg Oral QHS  . guaiFENesin  1,200 mg Oral BID  . heparin subcutaneous  5,000 Units Subcutaneous Q8H  . insulin aspart  0-9 Units Subcutaneous Q4H  . ipratropium-albuterol  3 mL Nebulization TID  . mouth rinse  15 mL Mouth Rinse BID  . multivitamin with minerals  1 tablet Oral Daily  . pantoprazole  40 mg Oral QHS  . potassium chloride  40 mEq Oral TID  . sodium chloride flush  10-40 mL Intracatheter Q12H  . thiamine  100 mg Oral QHS  . traZODone  50 mg Oral QHS   Continuous Infusions: . sodium chloride    . sodium chloride 10 mL/hr at 04/27/17 1300  . sodium chloride    . sodium chloride    . piperacillin-tazobactam (ZOSYN)  IV Stopped (04/28/17 1843)  . vancomycin Stopped (04/28/17 1748)     LOS: 17 days   Kerney Elbe, DO [facility] Pager [phone]  If 7PM-7AM, please contact night-coverage [email or web address] Password [facility] 04/28/2017, 9:31 PM

## 2017-04-28 NOTE — Progress Notes (Signed)
RT in room for scheduled nebulizer treatment. Pulse ox off finger. Upon replacing it patients O2 sats were in the 60's. O2 was in the floor. Placed back on  HFNC, increased to 15L . Sats improved to 96%. RN aware.

## 2017-04-28 NOTE — Progress Notes (Signed)
[facility] ROUNDING NOTE   Subjective:   Brief narrative   56 year old man with ETOH abuse and withdrawal, chronic tobacco use and history of a liver abscess and small bowel obstruction that was admitted from [facility] with pericolonic diverticular abscess.  He underwent percutaneous drainage and developed shock requiring pressors and ARDS. He was initially treated with CRRT  10/27 and intermittent dialysis  x1  11/3   His renal function has been gradually improving and his permcath was removed 11/10.  Her underwent thoracentesis 11/8 and cytology is pending   We do not have a baseline renal function although on admission creatinine was 3.93 however renal size was good and only borderline increase in echogenicity   Urine output has been good and weight has decreased all week by about 11 lbs   Creatinine has remained stable at about 3 mg/dl and gradually decreasing.  Chest X Ray 11/11 is suggestive of increased pulmonary edema and 120 mg lasix ordered.   Objective:  Vital signs in last 24 hours:  Temp:  [98.2 F (36.8 C)-98.9 F (37.2 C)] 98.6 F (37 C) (11/13 0720) Pulse Rate:  [111-127] 114 (11/13 0720) Resp:  [16-32] 24 (11/13 0720) BP: (112-134)/(84-98) 122/91 (11/13 0720) SpO2:  [92 %-99 %] 97 % (11/13 0750) FiO2 (%):  [40 %] 40 % (11/12 2218) Weight:  [65.1 kg (143 lb 8.3 oz)] 65.1 kg (143 lb 8.3 oz) (11/13 0500)  Weight change: -1.3 kg (-13.9 oz) Filed Weights   04/26/17 0400 04/27/17 0254 04/28/17 0500  Weight: 63.7 kg (140 lb 8 oz) 66.4 kg (146 lb 6.2 oz) 65.1 kg (143 lb 8.3 oz)    Intake/Output: I/O last 3 completed shifts: In: 1637.7 [P.O.:720; I.V.:467.7; IV Piggyback:450] Out: 1900 [Urine:1650; Drains:250]   Intake/Output this shift:  Total I/O In: 240 [P.O.:240] Out: -   Gen:  Some mild respiratory distress with increased confusion this morning HEENT: poor oral hygiene using nasal canula CVS: RRR, no murmur appreciated  Resp: breath  sounds coarse rhonchi but improved WUJ:WJXBJYNWG, hypoactive bowel sounds Ext: no peripheral edema, some dependent sacral edema     Basic Metabolic Panel: Recent Labs  Lab 04/24/17 0325 04/25/17 0258 04/26/17 0248 04/27/17 0335 04/28/17 0209  NA 131* 131* 131* 132* 133*  K 3.5 3.5 3.0* 3.1* 3.8  CL 95* 95* 93* 95* 97*  CO2 26 25 25 24 25   GLUCOSE 87 70 108* 92 80  BUN 26* 28* 30* 32* 34*  CREATININE 3.15* 3.23* 3.05* 2.93* 2.75*  CALCIUM 7.7* 7.9* 7.9* 7.7* 8.1*  MG 1.8 1.8 1.7 1.6* 1.8  PHOS 4.2 4.5 4.6 5.0* 3.9    Liver Function Tests: Recent Labs  Lab 04/24/17 0325 04/25/17 0258 04/26/17 0248 04/27/17 0335 04/28/17 0209  AST 18 18 18 17 19   ALT 9* 9* 8* 8* 9*  ALKPHOS 65 64 60 58 69  BILITOT 0.4 0.4 0.6 0.9 0.7  PROT 5.7* 6.5 6.3* 5.9* 6.5  ALBUMIN 1.7* 1.8* 1.7* 1.7* 1.8*   No results for input(s): LIPASE, AMYLASE in the last 168 hours. Recent Labs  Lab 04/24/17 1609 04/25/17 0842 04/27/17 0335  AMMONIA 54* 39* 37*    CBC: Recent Labs  Lab 04/24/17 0325 04/25/17 0258 04/26/17 0248 04/27/17 0335 04/28/17 0209  WBC 20.8* 19.7* 18.4* 14.4* 14.4*  NEUTROABS 16.4* 14.9* 14.2* 11.3* 10.5*  HGB 8.7* 8.1* 7.6* 7.4* 7.4*  HCT 25.5* 24.2* 22.3* 21.9* 22.1*  MCV 91.1 91.7 92.1 92.0 92.5  PLT 345  334 336 358 381    Cardiac Enzymes: No results for input(s): CKTOTAL, CKMB, CKMBINDEX, TROPONINI in the last 168 hours.  BNP: Invalid input(s): POCBNP  CBG: Recent Labs  Lab 04/27/17 1952 04/28/17 0009 04/28/17 0404 04/28/17 0739 04/28/17 1202  GLUCAP 127* 132* 86 43 115*    Microbiology: Results for orders placed or performed during the hospital encounter of 04/11/17  MRSA PCR Screening     Status: None   Collection Time: 04/11/17  3:54 PM  Result Value Ref Range Status   MRSA by PCR NEGATIVE NEGATIVE Final    Comment:        The GeneXpert MRSA Assay (FDA approved for NASAL specimens only), is one component of a comprehensive MRSA  colonization surveillance program. It is not intended to diagnose MRSA infection nor to guide or monitor treatment for MRSA infections.   Gram stain     Status: None   Collection Time: 04/23/17  3:54 PM  Result Value Ref Range Status   Specimen Description FLUID PLEURAL  Final   Special Requests NONE  Final   Gram Stain   Final    FEW WBC PRESENT,BOTH PMN AND MONONUCLEAR NO ORGANISMS SEEN    Report Status 04/23/2017 FINAL  Final  Culture, body fluid-bottle     Status: None   Collection Time: 04/23/17  3:54 PM  Result Value Ref Range Status   Specimen Description FLUID PLEURAL  Final   Special Requests NONE  Final   Culture NO GROWTH 5 DAYS  Final   Report Status 04/28/2017 FINAL  Final    Coagulation Studies: No results for input(s): LABPROT, INR in the last 72 hours.  Urinalysis: No results for input(s): COLORURINE, LABSPEC, PHURINE, GLUCOSEU, HGBUR, BILIRUBINUR, KETONESUR, PROTEINUR, UROBILINOGEN, NITRITE, LEUKOCYTESUR in the last 72 hours.  Invalid input(s): APPERANCEUR    Imaging: Dg Chest Port 1 View  Result Date: 04/28/2017 CLINICAL DATA:  Shortness of breath. EXAM: PORTABLE CHEST 1 VIEW COMPARISON:  Radiograph of April 27, 2017. FINDINGS: Stable cardiomediastinal silhouette. Stable diffuse interstitial densities are noted throughout both lungs most consistent with pulmonary edema. No pneumothorax is noted. Mild right basilar atelectasis or infiltrate is noted. Minimal right pleural effusion cannot be excluded. Bony thorax is unremarkable. IMPRESSION: Stable diffuse interstitial densities are noted throughout both lungs concerning for pulmonary edema. Mild right basilar atelectasis or infiltrate is noted with minimal right pleural effusion. Electronically Signed   By: Marijo Conception, M.D.   On: 04/28/2017 08:06   Dg Chest Port 1 View  Result Date: 04/27/2017 CLINICAL DATA:  56 year old male with shortness breath. Subsequent encounter. EXAM: PORTABLE CHEST 1 VIEW  COMPARISON:  04/26/2017 chest x-ray. FINDINGS: Asymmetric airspace disease greater on right which may represent pulmonary edema with right-sided pleural effusion and basilar atelectasis. Limited for excluding right base infiltrate or mass given the degree of pleural fluid and atelectasis. No pneumothorax. Heart size within normal limits. IMPRESSION: Minimal change from prior examination. Persistent asymmetric airspace disease greater on right which may represent pulmonary edema with right-sided pleural effusion and basilar atelectasis. Cannot exclude right lower lobe infiltrate in this setting of pleural fluid and right base atelectasis. Electronically Signed   By: Genia Del M.D.   On: 04/27/2017 07:47     Medications:   . sodium chloride    . sodium chloride 10 mL/hr at 04/27/17 1300  . sodium chloride    . sodium chloride    . dexmedetomidine (PRECEDEX) IV infusion Stopped (04/17/17 1700)  . piperacillin-tazobactam (  ZOSYN)  IV Stopped (04/28/17 2542)  . vancomycin Stopped (04/26/17 2017)   . feeding supplement (ENSURE ENLIVE)  237 mL Oral QID  . folic acid  1 mg Oral QHS  . furosemide  60 mg Intravenous BID  . guaiFENesin  1,200 mg Oral BID  . heparin subcutaneous  5,000 Units Subcutaneous Q8H  . insulin aspart  0-9 Units Subcutaneous Q4H  . ipratropium-albuterol  3 mL Nebulization TID  . mouth rinse  15 mL Mouth Rinse BID  . multivitamin with minerals  1 tablet Oral Daily  . pantoprazole  40 mg Oral QHS  . potassium chloride  40 mEq Oral TID  . sodium chloride flush  10-40 mL Intracatheter Q12H  . thiamine  100 mg Oral QHS  . traZODone  50 mg Oral QHS   sodium chloride, sodium chloride, sodium chloride, fentaNYL (SUBLIMAZE) injection, ipratropium-albuterol, metoprolol tartrate, ondansetron (ZOFRAN) IV, sodium chloride flush  Assessment/ Plan:  AKI, improving- Baseline Cr unknown.Initially on CRRT,  tolerated IHD. 11/3. Catheter removed on 11/10. Has good urine output and  response to lasix although appears to be decompensating from a pulmonary standpoint.  Creatinine has continued to improve 2.75 (2.93) today with fair urine output in the past 24 hrs while on lasix. Electrolytes stable, with low albumin. Patient is still receiving lasix for increase pulmonary edema and kidney function has continued to improve. --Lasix prn per primary team for increase pulm edema --F/u on am BMP, replete electrolytes as needed  Pleural effusion s/p thoracentesis Appears transudative on most recent labs although we are awaiting the results of the cytology                     Hypokalemia, resolving  replete as needed.   Anemia of chronic disease - initiated po iron,Aranesp 200 mcg q Monday  Hemoglobin has continued to decline unfortunately and will follow  VDRF -s/p extubation, increase in wbc and increase fever make me suspicious of a pneumonia process. He is to be placed on BIPAP although I fear that he will need reintubation. Agree with trial of lasix for pulmonary edema.  Diverticular abscess   s/p drain placed by IR, on Zosyn, repeat imaging per primary. Seen by surgery with recommendations for medical management and possible surgery in 6-8 weeks.  Hypophosphatemia, Resolved  Dispo: Nephrology is signing off today, will be available to primary team for any further questions while inpatient.   LOS: 17 [place] Schoene @TODAY @12 :25 PM

## 2017-04-29 DIAGNOSIS — R197 Diarrhea, unspecified: Secondary | ICD-10-CM

## 2017-04-29 DIAGNOSIS — J690 Pneumonitis due to inhalation of food and vomit: Secondary | ICD-10-CM

## 2017-04-29 LAB — CBC WITH DIFFERENTIAL/PLATELET
BASOS ABS: 0.1 10*3/uL (ref 0.0–0.1)
BASOS PCT: 1 %
EOS PCT: 1 %
Eosinophils Absolute: 0.2 10*3/uL (ref 0.0–0.7)
HCT: 22.4 % — ABNORMAL LOW (ref 39.0–52.0)
Hemoglobin: 7.5 g/dL — ABNORMAL LOW (ref 13.0–17.0)
Lymphocytes Relative: 15 %
Lymphs Abs: 1.9 10*3/uL (ref 0.7–4.0)
MCH: 31.4 pg (ref 26.0–34.0)
MCHC: 33.5 g/dL (ref 30.0–36.0)
MCV: 93.7 fL (ref 78.0–100.0)
MONO ABS: 1.3 10*3/uL — AB (ref 0.1–1.0)
Monocytes Relative: 10 %
Neutro Abs: 9.1 10*3/uL — ABNORMAL HIGH (ref 1.7–7.7)
Neutrophils Relative %: 73 %
PLATELETS: 400 10*3/uL (ref 150–400)
RBC: 2.39 MIL/uL — ABNORMAL LOW (ref 4.22–5.81)
RDW: 18.1 % — AB (ref 11.5–15.5)
WBC: 12.6 10*3/uL — ABNORMAL HIGH (ref 4.0–10.5)

## 2017-04-29 LAB — COMPREHENSIVE METABOLIC PANEL
ALBUMIN: 1.9 g/dL — AB (ref 3.5–5.0)
ALT: 9 U/L — ABNORMAL LOW (ref 17–63)
AST: 16 U/L (ref 15–41)
Alkaline Phosphatase: 67 U/L (ref 38–126)
Anion gap: 12 (ref 5–15)
BUN: 34 mg/dL — AB (ref 6–20)
CHLORIDE: 100 mmol/L — AB (ref 101–111)
CO2: 25 mmol/L (ref 22–32)
Calcium: 8.3 mg/dL — ABNORMAL LOW (ref 8.9–10.3)
Creatinine, Ser: 2.73 mg/dL — ABNORMAL HIGH (ref 0.61–1.24)
GFR calc Af Amer: 29 mL/min — ABNORMAL LOW (ref >60)
GFR, EST NON AFRICAN AMERICAN: 25 mL/min — AB (ref >60)
Glucose, Bld: 131 mg/dL — ABNORMAL HIGH (ref 65–99)
POTASSIUM: 4 mmol/L (ref 3.5–5.1)
Sodium: 137 mmol/L (ref 135–145)
Total Bilirubin: 0.7 mg/dL (ref 0.3–1.2)
Total Protein: 6.6 g/dL (ref 6.5–8.1)

## 2017-04-29 LAB — GLUCOSE, CAPILLARY
GLUCOSE-CAPILLARY: 118 mg/dL — AB (ref 65–99)
GLUCOSE-CAPILLARY: 93 mg/dL (ref 65–99)
GLUCOSE-CAPILLARY: 114 mg/dL — AB (ref 65–99)
GLUCOSE-CAPILLARY: 127 mg/dL — AB (ref 65–99)
Glucose-Capillary: 114 mg/dL — ABNORMAL HIGH (ref 65–99)
Glucose-Capillary: 103 mg/dL — ABNORMAL HIGH (ref 65–99)

## 2017-04-29 LAB — PHOSPHORUS: PHOSPHORUS: 4.8 mg/dL — AB (ref 2.5–4.6)

## 2017-04-29 LAB — MAGNESIUM: MAGNESIUM: 1.6 mg/dL — AB (ref 1.7–2.4)

## 2017-04-29 MED ORDER — ACETAMINOPHEN 325 MG PO TABS
650.0000 mg | ORAL_TABLET | Freq: Four times a day (QID) | ORAL | Status: DC | PRN
  Administered 2017-04-30: 650 mg via ORAL
  Filled 2017-04-29 (×2): qty 2

## 2017-04-29 MED ORDER — FENTANYL CITRATE (PF) 100 MCG/2ML IJ SOLN
25.0000 ug | INTRAMUSCULAR | Status: DC | PRN
  Administered 2017-04-30 – 2017-05-02 (×6): 25 ug via INTRAVENOUS
  Administered 2017-05-03 – 2017-05-05 (×8): 50 ug via INTRAVENOUS
  Administered 2017-05-06: 25 ug via INTRAVENOUS
  Administered 2017-05-06 – 2017-05-07 (×2): 50 ug via INTRAVENOUS
  Administered 2017-05-07: 25 ug via INTRAVENOUS
  Administered 2017-05-07 – 2017-05-12 (×18): 50 ug via INTRAVENOUS
  Administered 2017-05-12: 25 ug via INTRAVENOUS
  Administered 2017-05-12 – 2017-05-13 (×2): 50 ug via INTRAVENOUS
  Administered 2017-05-13: 25 ug via INTRAVENOUS
  Filled 2017-04-29 (×40): qty 2

## 2017-04-29 MED ORDER — LOPERAMIDE HCL 2 MG PO CAPS
2.0000 mg | ORAL_CAPSULE | Freq: Two times a day (BID) | ORAL | Status: AC | PRN
  Administered 2017-04-30 (×2): 2 mg via ORAL
  Filled 2017-04-29 (×2): qty 1

## 2017-04-29 MED ORDER — MAGNESIUM SULFATE IN D5W 1-5 GM/100ML-% IV SOLN
1.0000 g | Freq: Once | INTRAVENOUS | Status: AC
  Administered 2017-04-29: 1 g via INTRAVENOUS
  Filled 2017-04-29: qty 100

## 2017-04-29 MED ORDER — FUROSEMIDE 10 MG/ML IJ SOLN
60.0000 mg | Freq: Two times a day (BID) | INTRAMUSCULAR | Status: AC
  Administered 2017-04-29 – 2017-04-30 (×2): 60 mg via INTRAVENOUS
  Filled 2017-04-29 (×2): qty 6

## 2017-04-29 NOTE — Progress Notes (Signed)
Subjective/Chief Complaint: Patient still with abdominal tenderness  Afebrile WBC slowly trending down   Objective: Vital signs in last 24 hours: Temp:  [98.7 F (37.1 C)-99 F (37.2 C)] 99 F (37.2 C) (11/14 0400) Pulse Rate:  [107-110] 107 (11/14 0400) Resp:  [20-39] 20 (11/14 0400) BP: (112-126)/(77-93) 123/89 (11/14 0400) SpO2:  [94 %-100 %] 100 % (11/14 0723) Last BM Date: 04/28/17  Intake/Output from previous day: 11/13 0701 - 11/14 0700 In: 620 [P.O.:240; I.V.:130; IV Piggyback:250] Out: 675 [Urine:400; Drains:75; DQQIW:979] Intake/Output this shift: No intake/output data recorded.  General appearance: alert, cooperative and no distress Resp: clear to auscultation bilaterally Cardio: regular rate and rhythm, S1, S2 normal, no murmur, click, rub or gallop GI: soft, mildly distended; mild diffuse tenderness Drain - yellowish thick drainage  Lab Results:  Recent Labs    04/28/17 0209 04/29/17 0224  WBC 14.4* 12.6*  HGB 7.4* 7.5*  HCT 22.1* 22.4*  PLT 381 400   BMET Recent Labs    04/28/17 0209 04/29/17 0224  NA 133* 137  K 3.8 4.0  CL 97* 100*  CO2 25 25  GLUCOSE 80 131*  BUN 34* 34*  CREATININE 2.75* 2.73*  CALCIUM 8.1* 8.3*   PT/INR No results for input(s): LABPROT, INR in the last 72 hours. ABG Recent Labs    04/26/17 0940  PHART 7.368  HCO3 23.9    Studies/Results: Dg Chest Port 1 View  Result Date: 04/28/2017 CLINICAL DATA:  Shortness of breath EXAM: PORTABLE CHEST 1 VIEW COMPARISON:  04/28/2017, 04/27/2017, 04/26/2017 FINDINGS: Small right-sided pleural effusion and tiny left effusion. Stable cardiomediastinal silhouette with vascular congestion. Extensive interstitial right greater than left opacity suspect for edema. More focal consolidation or pneumonia at the bases, not significantly changed. No pneumothorax. IMPRESSION: 1. Little change in bilateral effusions and diffuse interstitial and alveolar opacity suspicious for edema  with possible bibasilar atelectasis or pneumonia. Electronically Signed   By: Donavan Foil M.D.   On: 04/28/2017 23:56   Dg Chest Port 1 View  Result Date: 04/28/2017 CLINICAL DATA:  Shortness of breath. EXAM: PORTABLE CHEST 1 VIEW COMPARISON:  Radiograph of April 27, 2017. FINDINGS: Stable cardiomediastinal silhouette. Stable diffuse interstitial densities are noted throughout both lungs most consistent with pulmonary edema. No pneumothorax is noted. Mild right basilar atelectasis or infiltrate is noted. Minimal right pleural effusion cannot be excluded. Bony thorax is unremarkable. IMPRESSION: Stable diffuse interstitial densities are noted throughout both lungs concerning for pulmonary edema. Mild right basilar atelectasis or infiltrate is noted with minimal right pleural effusion. Electronically Signed   By: Marijo Conception, M.D.   On: 04/28/2017 08:06    Anti-infectives: Anti-infectives (From admission, onward)   Start     Dose/Rate Route Frequency Ordered Stop   04/28/17 1530  vancomycin (VANCOCIN) IVPB 750 mg/150 ml premix     750 mg 150 mL/hr over 60 Minutes Intravenous Every 24 hours 04/28/17 1424     04/27/17 1400  piperacillin-tazobactam (ZOSYN) IVPB 3.375 g     3.375 g 12.5 mL/hr over 240 Minutes Intravenous Every 8 hours 04/27/17 1158     04/26/17 1830  vancomycin (VANCOCIN) 1,250 mg in sodium chloride 0.9 % 250 mL IVPB  Status:  Discontinued     1,250 mg 166.7 mL/hr over 90 Minutes Intravenous Every 48 hours 04/26/17 1739 04/28/17 1424   04/24/17 1700  vancomycin (VANCOCIN) IVPB 1000 mg/200 mL premix  Status:  Discontinued     1,000 mg 200 mL/hr over 60 Minutes  Intravenous  Once 04/24/17 1652 04/24/17 1656   04/24/17 1700  vancomycin (VANCOCIN) IVPB 1000 mg/200 mL premix  Status:  Discontinued     1,000 mg 200 mL/hr over 60 Minutes Intravenous Every 48 hours 04/24/17 1656 04/26/17 1739   04/23/17 1500  piperacillin-tazobactam (ZOSYN) IVPB 3.375 g  Status:  Discontinued      3.375 g 12.5 mL/hr over 240 Minutes Intravenous Every 12 hours 04/23/17 1107 04/27/17 1158   04/18/17 1800  piperacillin-tazobactam (ZOSYN) IVPB 3.375 g  Status:  Discontinued     3.375 g 12.5 mL/hr over 240 Minutes Intravenous Every 12 hours 04/18/17 1032 04/23/17 1107   04/18/17 1400  piperacillin-tazobactam (ZOSYN) IVPB 3.375 g  Status:  Discontinued     3.375 g 12.5 mL/hr over 240 Minutes Intravenous Every 8 hours 04/18/17 1031 04/18/17 1032   04/17/17 1200  piperacillin-tazobactam (ZOSYN) IVPB 3.375 g  Status:  Discontinued     3.375 g 100 mL/hr over 30 Minutes Intravenous Every 6 hours 04/17/17 1030 04/18/17 1031   04/17/17 1100  piperacillin-tazobactam (ZOSYN) IVPB 3.375 g  Status:  Discontinued     3.375 g 12.5 mL/hr over 240 Minutes Intravenous Every 8 hours 04/17/17 1019 04/17/17 1030   04/15/17 2125  Ampicillin-Sulbactam (UNASYN) 3 g in sodium chloride 0.9 % 100 mL IVPB  Status:  Discontinued     3 g 200 mL/hr over 30 Minutes Intravenous Every 8 hours 04/15/17 1529 04/17/17 1019   04/15/17 1100  ampicillin-sulbactam (UNASYN) 1.5 g in sodium chloride 0.9 % 50 mL IVPB  Status:  Discontinued     1.5 g 100 mL/hr over 30 Minutes Intravenous Every 12 hours 04/15/17 0937 04/15/17 1529   04/13/17 2200  levofloxacin (LEVAQUIN) IVPB 500 mg  Status:  Discontinued     500 mg 100 mL/hr over 60 Minutes Intravenous Every 48 hours 04/12/17 0114 04/12/17 0846   04/13/17 1800  piperacillin-tazobactam (ZOSYN) IVPB 2.25 g  Status:  Discontinued     2.25 g 100 mL/hr over 30 Minutes Intravenous Every 8 hours 04/13/17 1050 04/15/17 0937   04/12/17 0200  piperacillin-tazobactam (ZOSYN) IVPB 3.375 g  Status:  Discontinued     3.375 g 12.5 mL/hr over 240 Minutes Intravenous Every 8 hours 04/12/17 0103 04/13/17 1050   04/12/17 0000  levofloxacin (LEVAQUIN) IVPB 750 mg  Status:  Discontinued     750 mg 100 mL/hr over 90 Minutes Intravenous Every 48 hours 04/11/17 2230 04/12/17 0114       Assessment/Plan: Pulmonary edema Pleural effusion s/p thoracentesis AKI - Cr 2.75, BUN 34; slowly improving Anemia of chronic disease Hypokalemia - K 3.8, improving  Diverticulitis with perforation s/p IR drain - Repeat CT 04/19/17: no significant residual abscess around drain, probablecolocutaneousfistula. Stable small bowel dilation. - Afebrile, WBC stable  - Drain75cc/24h, continue drain - Having bowel function(watery stool in rectal tube)and pain slowly improving - Continue IV abx - continue therapies - if he develops fever, increasing WBC, or worsening pain, will repeat CT -Long term plan would be to control leak and keep on abx. Hopefully have definitive surgery in 6-8 weeks, possibly with Dr. Lilia Pro at [facility] or Dr. Rosendo Gros here. Will need to increase PO intake/protein intake.   FEN: HH/carb mod; prealbumin 7.9 on 11/8 - continue ensure to TID. Encourage 5-6 small meals daily. ID: Unasyn 10/31-11/2, Zosyn 10/28  VTE: SCD's, SQ heparin    LOS: 18 days    Dominic Waskey K. 04/29/2017

## 2017-04-29 NOTE — Care Management Note (Signed)
Case Management Note  Patient Details  Name: Dominic Phillips MRN: [id number] Date of Birth: 09-05-1960  Subjective/Objective:    Pt admitted with AKI and ARDS       Action/Plan:  PTA from home.  Pt extubated 04/17/17 but still requiring CRRT.  CSW consulted for substance abuse and potential placement.  CM will continue to follow for discharge needs   Expected Discharge Date:                  Expected Discharge Plan:  [place]  In-House Referral:  Clinical Social Work  Discharge planning Services  CM Consult  Post Acute Care Choice:    Choice offered to:     DME Arranged:    DME Agency:     HH Arranged:    [place] Agency:     Status of Service:     If discussed at [employer], dates discussed:    Additional Comments:   04/28/17 CM requested Palliative consult from attending  04/24/17 CM discussed pt with attending - pt is still not appropriate for discharge to a facility; post thoracentesis on yesterday pt aspirated.  CSW notified and CIR -  will continue to follow for discharge needs  04/23/17 Discussed in LOS 04/23/17 - pt remains appropriate for continued stay.  Pt has increasing pleural effusion and will require a thoracentesis, remains on IV zosyn.  Discharge plan continues to be SNF - CSW actively working placement   04/21/17 Discussed in LOS 11/6 - remains appropriate for continued stay- JP drain still in place, WBC trending up, IV antibiotics.  Pt is also being followed by renal - pt has received 1 IHD treatment with daily assessments for additional HD sessions.  Maryclare Labrador, RN 04/29/2017, 10:22 AM

## 2017-04-29 NOTE — Progress Notes (Signed)
Nutrition Follow-up  DOCUMENTATION CODES:   Non-severe (moderate) malnutrition in context of chronic illness  INTERVENTION:   -Continue Ensure Enlive po QID, each supplement provides 350 kcal and 20 grams of protein -Continue MVI daily  NUTRITION DIAGNOSIS:   Moderate Malnutrition related to chronic illness(ETOH abuse) as evidenced by mild fat depletion, mild muscle depletion.  Ongoing  GOAL:   Patient will meet greater than or equal to 90% of their needs  Progressing  MONITOR:   PO intake, Supplement acceptance, Labs, I & O's, Weight trends  REASON FOR ASSESSMENT:   Consult Assessment of nutrition requirement/status, Poor PO  ASSESSMENT:   56 year old male with PMH of ETOH abuse who was admitted to [facility] on 10/18 with diverticular abscess; hospital course complicated by DT's, development of a SBO, and PNA; required intubation on 10/25. He was transferred to [facility] on 10/27 with worsening renal failure, ARDS, aspiration PNA.  11/6- hypoglycemic event (CBGS: 62); reviewed nephrology note, no need for HD at this time 11/7- reviewed surgery note; plan to control leak and continue antibiotics; potential surgery in 6-8 weeks 11/8- s/p rt thoracentesis (1.3 L removed)  11/10- SLP evaluation- continue regular consistency diet with thin liquids, IJ cath removed  Pt sleeping soundly at time of visit. Reviewed breakfast tray at bedside, which was untouched. Meal completion 0-75% (averaging about 20% of meal intake). Pt consumed about 50% of Ensure supplement.   Labs reviewed: CBGS: 161-096 (inpatient orders for glycemic control are 0-9 units insulin aspart every 4 hours), Phos: 4.8, Mg: 1.6.  Diet Order:  Diet heart healthy/carb modified Room service appropriate? Yes; Fluid consistency: Thin  EDUCATION NEEDS:   No education needs have been identified at this time  Skin:  Skin Assessment: Skin Integrity Issues: Skin Integrity Issues:: Stage I Stage I: sacrum  Last  BM:  04/24/17 (100 ml output via rectal tube)  Height:   Ht Readings from Last 1 Encounters:  04/11/17 5\' 11"  (1.803 m)    Weight:   Wt Readings from Last 1 Encounters:  04/28/17 143 lb 8.3 oz (65.1 kg)    Ideal Body Weight:  78.2 kg  BMI:  Body mass index is 20.02 kg/m.  Estimated Nutritional Needs:   Kcal:  2000-2200  Protein:  100-115 grams  Fluid:  2-2.2 L    Foye Damron A. Jimmye Norman, RD, LDN, CDE Pager: [phone] After hours Pager: [phone]

## 2017-04-29 NOTE — Progress Notes (Addendum)
PROGRESS NOTE   Dominic Phillips  DJS:[id number]    DOB: 1960-08-27    DOA: 04/11/2017  PCP: No primary care provider on file.   I have briefly reviewed patients previous medical records in [facility].  Brief Narrative:  56 year old male with PMH of alcohol abuse presented to [facility] on 04/02/17 with complaints of abdominal pain without fevers, leukocytosis or diarrhea. CT abdomen revealed pericolonic inflammation/fluid collection and he was admitted for diverticular abscess. General surgery was consulted who suggested placement of percutaneous drain by IR. Cultures grew Escherichia coli. Hospital course complicated by DTs, acute respiratory failure with hypoxia/ARDS/aspiration pneumonia and circulatory shock for which he required intubation and pressors, acute kidney injury for which she required CRRT and IHD 1. Shock resolved. Renal functions improving. General surgery continue to follow. Nephrology signed off.   Assessment & Plan:   Active Problems:   Acute respiratory distress syndrome (ARDS) (HCC)   Acute kidney injury ([place])   Pressure injury of skin   Colonic diverticular abscess   COPD (chronic obstructive pulmonary disease) (HCC)   Dyspnea   HCAP (healthcare-associated pneumonia)   Acute blood loss anemia   Anemia of chronic disease   ETOH abuse   Tobacco abuse   Tachycardia   Tachypnea   Leukocytosis   Post-operative pain   Acute respiratory failure with hypoxia/ARDS/VDRF - Required intubation from 10/27 to 11/2 - S/P thoracentesis of 1.3 L. - At this stage likely multifactorial: Pulmonary edema, pleural effusions, atelectasis, pneumonia. - Remains on IV vancomycin and Zosyn. Continue IV Lasix with strict intake output monitoring and daily weights. - As per nursing, refused BiPAP overnight. Was transitioned from 15 L HFNC oxygen to 8 L this morning. Wean oxygen as tolerated to keep saturations >90%, incentive spirometry, mobilize. - Follow-up chest x-ray  periodically.  B/L pleural effusions, right >left - Status post right thoracentesis of 1.3 L. - Likely transudative. Pleural fluid cytology shows reactive mesothelial cells. - Continue IV Lasix and follow chest x-ray periodically.  Aspiration pneumonia/HCAP - Patient had completed a course of IV Zosyn but apparently aspirated again and was laced back on IV Zosyn and vancomycin. -  Aggressive pulmonary toilet.  Diverticulitis with perforation and abscess, S/P IR drain - Gen. 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 - Continue IV antibiotics - Per surgery, plan would be to control leak and keep on antibiotics. Hopefully have definitive surgery in 6-8 weeks possibly with Dr. Lilia Pro at [facility] or Dr. Rosendo Gros here.  Nonsevere (moderate) malnutrition in the context of chronic illness - Management as per dietitian recommendations.  Acute kidney injury - Baseline creatinine unknown. Nephrology consulted. Treated with CRRT 10/27 and intermittent dialysis 1 on 11/3. Renal functions improved. PermCath removed 11/10. Nephrology signed off 11/13. - Creatinine has plateaued in the 2.7 range over the last 2 days. Continue to monitor closely while on IV Lasix.  Hypokalemia - Replaced.  Anemia of chronic disease - Hemoglobin stable in the mid 7 g range. By mouth iron started. Aranesp. Did receive 1 unit of PRBC this admission.  Hypophosphatemia - Resolved.  Hypomagnesemia - Replace and follow.  COPD/tobacco abuse - No clinical bronchospasm. Cessation counseled.  Circulatory shock - Resolved.  Alcohol abuse - No clinical withdrawal.  Hyperammonemia - Resolved.  Diarrhea  - Suspect due to diverticulitis process. Trial of Imodium, one or 2 doses. Low index of suspicion for C. difficile.  DVT prophylaxis: Subcutaneous heparin Code Status: Partial Family Communication: None  at bedside Disposition: To be  determined   Consultants:  General surgery CCM Nephrology CIR   Procedures:  CRRT IHD  Antimicrobials:  IV vancomycin IV Zosyn    Subjective: Report some lower abdominal pain, intermittent. Appetite poor. Denies dyspnea or chest pain. Cough with minimal intermittent green sputum. As per RN, refused BiPAP last night, able to titrate oxygen down from 15 L to 8 L. Loose stools and rectal tube.  ROS: As above.  Objective:  Vitals:   04/29/17 1230 04/29/17 1252 04/29/17 1339 04/29/17 1359  BP: 112/86 109/85    Pulse:      Resp: (!) 26     Temp: 98.4 F (36.9 C)     TempSrc: Oral     SpO2:   97% 95%  Weight:      Height:        Examination:  General exam: Middle-aged male, small built, frail and chronically ill-looking lying comfortably propped up in bed. Respiratory system: Diminished breath sounds in the bases with few basal crackles but otherwise clear to auscultation. Respiratory effort normal. Cardiovascular system: S1 & S2 heard, RRR. No JVD, murmurs, rubs, gallops or clicks. No pedal edema. Telemetry: Sinus tachycardia in the 110s-120s. Gastrointestinal system: Abdomen is nondistended, soft and nontender. No organomegaly or masses felt. Normal bowel sounds heard. Right-sided abdominal drain draining brownish liquid. Rectal tube with soft brown stools. Central nervous system: Alert and oriented. No focal neurological deficits. Extremities: Symmetric 5 x 5 power. Skin: No rashes, lesions or ulcers Psychiatry: Judgement and insight appear normal. Mood & affect appropriate.     Data Reviewed: I have personally reviewed following labs and imaging studies  CBC: Recent Labs  Lab 04/25/17 0258 04/26/17 0248 04/27/17 0335 04/28/17 0209 04/29/17 0224  WBC 19.7* 18.4* 14.4* 14.4* 12.6*  NEUTROABS 14.9* 14.2* 11.3* 10.5* 9.1*  HGB 8.1* 7.6* 7.4* 7.4* 7.5*  HCT 24.2* 22.3* 21.9* 22.1* 22.4*  MCV 91.7 92.1 92.0 92.5 93.7  PLT 334 336 358 381 354   Basic  Metabolic Panel: Recent Labs  Lab 04/25/17 0258 04/26/17 0248 04/27/17 0335 04/28/17 0209 04/29/17 0224  NA 131* 131* 132* 133* 137  K 3.5 3.0* 3.1* 3.8 4.0  CL 95* 93* 95* 97* 100*  CO2 25 25 24 25 25   GLUCOSE 70 108* 92 80 131*  BUN 28* 30* 32* 34* 34*  CREATININE 3.23* 3.05* 2.93* 2.75* 2.73*  CALCIUM 7.9* 7.9* 7.7* 8.1* 8.3*  MG 1.8 1.7 1.6* 1.8 1.6*  PHOS 4.5 4.6 5.0* 3.9 4.8*   Liver Function Tests: Recent Labs  Lab 04/25/17 0258 04/26/17 0248 04/27/17 0335 04/28/17 0209 04/29/17 0224  AST 18 18 17 19 16   ALT 9* 8* 8* 9* 9*  ALKPHOS 64 60 58 69 67  BILITOT 0.4 0.6 0.9 0.7 0.7  PROT 6.5 6.3* 5.9* 6.5 6.6  ALBUMIN 1.8* 1.7* 1.7* 1.8* 1.9*   CBG: Recent Labs  Lab 04/28/17 1927 04/28/17 2330 04/29/17 0311 04/29/17 0812 04/29/17 1233  GLUCAP 126* 121* 114* 118* 93    Recent Results (from the past 240 hour(s))  Gram stain     Status: None   Collection Time: 04/23/17  3:54 PM  Result Value Ref Range Status   Specimen Description FLUID PLEURAL  Final   Special Requests NONE  Final   Gram Stain   Final    FEW WBC PRESENT,BOTH PMN AND MONONUCLEAR NO ORGANISMS SEEN    Report Status 04/23/2017 FINAL  Final  Culture, body fluid-bottle  Status: None   Collection Time: 04/23/17  3:54 PM  Result Value Ref Range Status   Specimen Description FLUID PLEURAL  Final   Special Requests NONE  Final   Culture NO GROWTH 5 DAYS  Final   Report Status 04/28/2017 FINAL  Final         Radiology Studies: Dg Chest Port 1 View  Result Date: 04/28/2017 CLINICAL DATA:  Shortness of breath EXAM: PORTABLE CHEST 1 VIEW COMPARISON:  04/28/2017, 04/27/2017, 04/26/2017 FINDINGS: Small right-sided pleural effusion and tiny left effusion. Stable cardiomediastinal silhouette with vascular congestion. Extensive interstitial right greater than left opacity suspect for edema. More focal consolidation or pneumonia at the bases, not significantly changed. No pneumothorax.  IMPRESSION: 1. Little change in bilateral effusions and diffuse interstitial and alveolar opacity suspicious for edema with possible bibasilar atelectasis or pneumonia. Electronically Signed   By: Donavan Foil M.D.   On: 04/28/2017 23:56   Dg Chest Port 1 View  Result Date: 04/28/2017 CLINICAL DATA:  Shortness of breath. EXAM: PORTABLE CHEST 1 VIEW COMPARISON:  Radiograph of April 27, 2017. FINDINGS: Stable cardiomediastinal silhouette. Stable diffuse interstitial densities are noted throughout both lungs most consistent with pulmonary edema. No pneumothorax is noted. Mild right basilar atelectasis or infiltrate is noted. Minimal right pleural effusion cannot be excluded. Bony thorax is unremarkable. IMPRESSION: Stable diffuse interstitial densities are noted throughout both lungs concerning for pulmonary edema. Mild right basilar atelectasis or infiltrate is noted with minimal right pleural effusion. Electronically Signed   By: Marijo Conception, M.D.   On: 04/28/2017 08:06        Scheduled Meds: . feeding supplement (ENSURE ENLIVE)  237 mL Oral QID  . folic acid  1 mg Oral QHS  . guaiFENesin  1,200 mg Oral BID  . heparin subcutaneous  5,000 Units Subcutaneous Q8H  . insulin aspart  0-9 Units Subcutaneous Q4H  . ipratropium-albuterol  3 mL Nebulization TID  . mouth rinse  15 mL Mouth Rinse BID  . multivitamin with minerals  1 tablet Oral Daily  . pantoprazole  40 mg Oral QHS  . potassium chloride  40 mEq Oral TID  . sodium chloride flush  10-40 mL Intracatheter Q12H  . thiamine  100 mg Oral QHS  . traZODone  50 mg Oral QHS   Continuous Infusions: . sodium chloride    . sodium chloride 10 mL/hr at 04/27/17 1300  . sodium chloride    . sodium chloride    . piperacillin-tazobactam (ZOSYN)  IV Stopped (04/29/17 0837)  . vancomycin Stopped (04/28/17 1748)     LOS: 23 days     HONGALGI,ANAND, MD, FACP, FHM. [facility] Pager [phone]  If 7PM-7AM, please contact  night-coverage [email or web address] Password [email or web address] 04/29/2017, 2:02 PM

## 2017-04-29 NOTE — Progress Notes (Signed)
Physical Therapy Treatment Patient Details Name: Dominic Phillips MRN: [id number] DOB: Feb 16, 1961 Today's Date: 04/29/2017    History of Present Illness Pt admitted to [facility] and transferred on 04/02/17 with c/o abdominal pain.  Pt presented with ETOH withdrawls, liver abscess and SBO on TPN.  10/23 presents with hypoxia and increased WBC count, 10/25 intubated, 10/26 scans show PNA and fluid overload, patient extubated on 04/17/17.  Chart review shows COPD, VDRF, anemia, AKI and hypokalemia in addition to previous listed statements.  PMH: ETOH abuse.  11/8 R thoracentesis.    PT Comments    Pt in bed with multiple electrodes pulled off, no gown on, and condom cath unattached to bag upon arrival. BP was 109/85 pre-treatment. Pt required cues for hand and foot placement while rolling. Mod assist with trunk elevation from sidelying to sit. Able to scoot EOB with mod I. Pt had stool all over hands and bottom. Gave pt wash clothes and he cleaned hands sitting EOB; no LOB. Pt required mod assist sit to stand. Able to maintain balance with support of RW while cleaned up. Became agitated from having to stand too long and took 2 rest breaks. Ambulated to chair in room and stood on scale to be weighed. Pt on 3 L/min O2  and sats > 90% throughout treatment. Pt would benefit from rehab to regain functional mobility and independence.    Follow Up Recommendations  CIR     Equipment Recommendations  Rolling walker with 5" wheels;3in1 (PT)    Recommendations for Other Services       Precautions / Restrictions Precautions Precautions: Fall Precaution Comments: Drain, Flexiseal, catheter Restrictions Weight Bearing Restrictions: No    Mobility  Bed Mobility Overal bed mobility: Needs Assistance Bed Mobility: Sidelying to Sit;Rolling Rolling: Min assist Sidelying to sit: Mod assist       General bed mobility comments: VCs for foot and hand placement while rolling. Mod assist for  eleveating trunk from sidelying. Pt able to scoot EOB with mod I.  Transfers Overall transfer level: Needs assistance Equipment used: Rolling walker (2 wheeled) Transfers: Sit to/from Stand Sit to Stand: Mod assist         General transfer comment: Pt required mod asssit for power up and for trunk straightening. VCs for safe hand placement.  Ambulation/Gait Ambulation/Gait assistance: Min assist Ambulation Distance (Feet): 10 Feet Assistive device: Rolling walker (2 wheeled) Gait Pattern/deviations: Step-through pattern;Decreased step length - right;Decreased step length - left;Narrow base of support Gait velocity: slow   General Gait Details: Due to standing while being cleaned pt was fatigued and unable to walk outside room. VCs for posture.   Stairs            Wheelchair Mobility    Modified Rankin (Stroke Patients Only)       Balance Overall balance assessment: Needs assistance Sitting-balance support: No upper extremity supported;Feet supported Sitting balance-Leahy Scale: Fair Sitting balance - Comments: Pt with right lateral lean needing VCs to sit upright. Sat EOB 5 minutes with no LOB. Postural control: Right lateral lean Standing balance support: Bilateral upper extremity supported Standing balance-Leahy Scale: Poor Standing balance comment: Relied on UE support while standing. Able to stand with RW 5 minutes with two rest breaks. Complained that his legs were wobbling.                            Cognition Arousal/Alertness: Awake/alert Behavior During Therapy: WFL for tasks assessed/performed Overall  Cognitive Status: Within Functional Limits for tasks assessed                                 General Comments: Pt began to become agitated due to having to stand for such a long period while being cleaned up.      Exercises      General Comments        Pertinent Vitals/Pain Pain Assessment: Faces Faces Pain Scale:  Hurts even more Pain Location: anterior abdominal area Pain Descriptors / Indicators: Aching;Grimacing;Guarding;Moaning Pain Intervention(s): Limited activity within patient's tolerance;Monitored during session;Repositioned    Home Living                      Prior Function            PT Goals (current goals can now be found in the care plan section) Acute Rehab PT Goals Patient Stated Goal: none discussed PT Goal Formulation: With patient Time For Goal Achievement: 05/02/17 Potential to Achieve Goals: Good Progress towards PT goals: Progressing toward goals    Frequency    Min 3X/week      PT Plan Current plan remains appropriate    Co-evaluation              AM-PAC PT "6 Clicks" Daily Activity  Outcome Measure  Difficulty turning over in bed (including adjusting bedclothes, sheets and blankets)?: A Lot Difficulty moving from lying on back to sitting on the side of the bed? : A Lot Difficulty sitting down on and standing up from a chair with arms (e.g., wheelchair, bedside commode, etc,.)?: A Lot Help needed moving to and from a bed to chair (including a wheelchair)?: A Little Help needed walking in hospital room?: A Little Help needed climbing 3-5 steps with a railing? : A Lot 6 Click Score: 14    End of Session Equipment Utilized During Treatment: Oxygen;Gait belt Activity Tolerance: Patient tolerated treatment well Patient left: with call bell/phone within reach;in chair;with chair alarm set Nurse Communication: Mobility status PT Visit Diagnosis: Unsteadiness on feet (R26.81);Muscle weakness (generalized) (M62.81);Other abnormalities of gait and mobility (R26.89)     Time: 3785-8850 PT Time Calculation (min) (ACUTE ONLY): 37 min  Charges:  $Therapeutic Activity: 23-37 mins                    G Codes:       Janna Arch, SPTA   Janna Arch 04/29/2017, 2:00 PM

## 2017-04-30 ENCOUNTER — Inpatient Hospital Stay (HOSPITAL_COMMUNITY): Payer: Medicaid Other

## 2017-04-30 DIAGNOSIS — D649 Anemia, unspecified: Secondary | ICD-10-CM

## 2017-04-30 LAB — RENAL FUNCTION PANEL
ALBUMIN: 1.8 g/dL — AB (ref 3.5–5.0)
ANION GAP: 12 (ref 5–15)
BUN: 33 mg/dL — ABNORMAL HIGH (ref 6–20)
CALCIUM: 8.2 mg/dL — AB (ref 8.9–10.3)
CO2: 23 mmol/L (ref 22–32)
CREATININE: 2.61 mg/dL — AB (ref 0.61–1.24)
Chloride: 101 mmol/L (ref 101–111)
GFR calc non Af Amer: 26 mL/min — ABNORMAL LOW (ref >60)
GFR, EST AFRICAN AMERICAN: 30 mL/min — AB (ref >60)
GLUCOSE: 99 mg/dL (ref 65–99)
PHOSPHORUS: 5 mg/dL — AB (ref 2.5–4.6)
Potassium: 4.7 mmol/L (ref 3.5–5.1)
SODIUM: 136 mmol/L (ref 135–145)

## 2017-04-30 LAB — CBC
HCT: 21.2 % — ABNORMAL LOW (ref 39.0–52.0)
HEMOGLOBIN: 7 g/dL — AB (ref 13.0–17.0)
MCH: 31.4 pg (ref 26.0–34.0)
MCHC: 33 g/dL (ref 30.0–36.0)
MCV: 95.1 fL (ref 78.0–100.0)
Platelets: 443 10*3/uL — ABNORMAL HIGH (ref 150–400)
RBC: 2.23 MIL/uL — AB (ref 4.22–5.81)
RDW: 18.3 % — ABNORMAL HIGH (ref 11.5–15.5)
WBC: 11.4 10*3/uL — AB (ref 4.0–10.5)

## 2017-04-30 LAB — MAGNESIUM: Magnesium: 1.9 mg/dL (ref 1.7–2.4)

## 2017-04-30 LAB — GLUCOSE, CAPILLARY
GLUCOSE-CAPILLARY: 106 mg/dL — AB (ref 65–99)
GLUCOSE-CAPILLARY: 94 mg/dL (ref 65–99)
GLUCOSE-CAPILLARY: 131 mg/dL — AB (ref 65–99)
GLUCOSE-CAPILLARY: 85 mg/dL (ref 65–99)
GLUCOSE-CAPILLARY: 163 mg/dL — AB (ref 65–99)

## 2017-04-30 LAB — PROCALCITONIN: Procalcitonin: 0.5 ng/mL

## 2017-04-30 LAB — PREPARE RBC (CROSSMATCH)

## 2017-04-30 MED ORDER — SODIUM CHLORIDE 0.9 % IV SOLN
Freq: Once | INTRAVENOUS | Status: AC
  Administered 2017-04-30: 17:00:00 via INTRAVENOUS

## 2017-04-30 MED ORDER — IPRATROPIUM-ALBUTEROL 0.5-2.5 (3) MG/3ML IN SOLN
3.0000 mL | Freq: Two times a day (BID) | RESPIRATORY_TRACT | Status: DC
  Administered 2017-04-30 – 2017-05-01 (×2): 3 mL via RESPIRATORY_TRACT
  Filled 2017-04-30 (×2): qty 3

## 2017-04-30 MED ORDER — MORPHINE SULFATE (PF) 2 MG/ML IV SOLN
1.0000 mg | Freq: Once | INTRAVENOUS | Status: AC
  Administered 2017-04-30: 1 mg via INTRAVENOUS
  Filled 2017-04-30: qty 1

## 2017-04-30 MED ORDER — FUROSEMIDE 10 MG/ML IJ SOLN
60.0000 mg | Freq: Two times a day (BID) | INTRAMUSCULAR | Status: AC
  Administered 2017-04-30 – 2017-05-01 (×2): 60 mg via INTRAVENOUS
  Filled 2017-04-30 (×2): qty 6

## 2017-04-30 NOTE — Progress Notes (Signed)
Subjective/Chief Complaint: Awake, alert Mild RLQ tenderness around drain Still having diarrhea   Objective: Vital signs in last 24 hours: Temp:  [97.8 F (36.6 C)-98.7 F (37.1 C)] 98.7 F (37.1 C) (11/15 0614) Pulse Rate:  [102-117] 104 (11/15 0810) Resp:  [19-32] 19 (11/15 0810) BP: (105-132)/(75-93) 132/93 (11/15 0810) SpO2:  [91 %-100 %] 96 % (11/15 0810) Weight:  [61.1 kg (134 lb 11.2 oz)-62 kg (136 lb 11 oz)] 62 kg (136 lb 11 oz) (11/15 0614) Last BM Date: 04/30/17  Intake/Output from previous day: 11/14 0701 - 11/15 0700 In: 1200 [P.O.:1200] Out: 1870 [Urine:1850; Drains:20] Intake/Output this shift: No intake/output data recorded.  General appearance: alert, cooperative and no distress GI: soft, mildly distended; + BS; tender around RLQ drain site Drain with yellowish drainage  Lab Results:  Recent Labs    04/29/17 0224 04/30/17 0418  WBC 12.6* 11.4*  HGB 7.5* 7.0*  HCT 22.4* 21.2*  PLT 400 443*   BMET Recent Labs    04/29/17 0224 04/30/17 0418  NA 137 136  K 4.0 4.7  CL 100* 101  CO2 25 23  GLUCOSE 131* 99  BUN 34* 33*  CREATININE 2.73* 2.61*  CALCIUM 8.3* 8.2*   PT/INR No results for input(s): LABPROT, INR in the last 72 hours. ABG No results for input(s): PHART, HCO3 in the last 72 hours.  Invalid input(s): PCO2, PO2  Studies/Results: Dg Chest Port 1 View  Result Date: 04/28/2017 CLINICAL DATA:  Shortness of breath EXAM: PORTABLE CHEST 1 VIEW COMPARISON:  04/28/2017, 04/27/2017, 04/26/2017 FINDINGS: Small right-sided pleural effusion and tiny left effusion. Stable cardiomediastinal silhouette with vascular congestion. Extensive interstitial right greater than left opacity suspect for edema. More focal consolidation or pneumonia at the bases, not significantly changed. No pneumothorax. IMPRESSION: 1. Little change in bilateral effusions and diffuse interstitial and alveolar opacity suspicious for edema with possible bibasilar  atelectasis or pneumonia. Electronically Signed   By: Donavan Foil M.D.   On: 04/28/2017 23:56    Anti-infectives: Anti-infectives (From admission, onward)   Start     Dose/Rate Route Frequency Ordered Stop   04/28/17 1530  vancomycin (VANCOCIN) IVPB 750 mg/150 ml premix     750 mg 150 mL/hr over 60 Minutes Intravenous Every 24 hours 04/28/17 1424     04/27/17 1400  piperacillin-tazobactam (ZOSYN) IVPB 3.375 g     3.375 g 12.5 mL/hr over 240 Minutes Intravenous Every 8 hours 04/27/17 1158     04/26/17 1830  vancomycin (VANCOCIN) 1,250 mg in sodium chloride 0.9 % 250 mL IVPB  Status:  Discontinued     1,250 mg 166.7 mL/hr over 90 Minutes Intravenous Every 48 hours 04/26/17 1739 04/28/17 1424   04/24/17 1700  vancomycin (VANCOCIN) IVPB 1000 mg/200 mL premix  Status:  Discontinued     1,000 mg 200 mL/hr over 60 Minutes Intravenous  Once 04/24/17 1652 04/24/17 1656   04/24/17 1700  vancomycin (VANCOCIN) IVPB 1000 mg/200 mL premix  Status:  Discontinued     1,000 mg 200 mL/hr over 60 Minutes Intravenous Every 48 hours 04/24/17 1656 04/26/17 1739   04/23/17 1500  piperacillin-tazobactam (ZOSYN) IVPB 3.375 g  Status:  Discontinued     3.375 g 12.5 mL/hr over 240 Minutes Intravenous Every 12 hours 04/23/17 1107 04/27/17 1158   04/18/17 1800  piperacillin-tazobactam (ZOSYN) IVPB 3.375 g  Status:  Discontinued     3.375 g 12.5 mL/hr over 240 Minutes Intravenous Every 12 hours 04/18/17 1032 04/23/17 1107   04/18/17  1400  piperacillin-tazobactam (ZOSYN) IVPB 3.375 g  Status:  Discontinued     3.375 g 12.5 mL/hr over 240 Minutes Intravenous Every 8 hours 04/18/17 1031 04/18/17 1032   04/17/17 1200  piperacillin-tazobactam (ZOSYN) IVPB 3.375 g  Status:  Discontinued     3.375 g 100 mL/hr over 30 Minutes Intravenous Every 6 hours 04/17/17 1030 04/18/17 1031   04/17/17 1100  piperacillin-tazobactam (ZOSYN) IVPB 3.375 g  Status:  Discontinued     3.375 g 12.5 mL/hr over 240 Minutes Intravenous  Every 8 hours 04/17/17 1019 04/17/17 1030   04/15/17 2125  Ampicillin-Sulbactam (UNASYN) 3 g in sodium chloride 0.9 % 100 mL IVPB  Status:  Discontinued     3 g 200 mL/hr over 30 Minutes Intravenous Every 8 hours 04/15/17 1529 04/17/17 1019   04/15/17 1100  ampicillin-sulbactam (UNASYN) 1.5 g in sodium chloride 0.9 % 50 mL IVPB  Status:  Discontinued     1.5 g 100 mL/hr over 30 Minutes Intravenous Every 12 hours 04/15/17 0937 04/15/17 1529   04/13/17 2200  levofloxacin (LEVAQUIN) IVPB 500 mg  Status:  Discontinued     500 mg 100 mL/hr over 60 Minutes Intravenous Every 48 hours 04/12/17 0114 04/12/17 0846   04/13/17 1800  piperacillin-tazobactam (ZOSYN) IVPB 2.25 g  Status:  Discontinued     2.25 g 100 mL/hr over 30 Minutes Intravenous Every 8 hours 04/13/17 1050 04/15/17 0937   04/12/17 0200  piperacillin-tazobactam (ZOSYN) IVPB 3.375 g  Status:  Discontinued     3.375 g 12.5 mL/hr over 240 Minutes Intravenous Every 8 hours 04/12/17 0103 04/13/17 1050   04/12/17 0000  levofloxacin (LEVAQUIN) IVPB 750 mg  Status:  Discontinued     750 mg 100 mL/hr over 90 Minutes Intravenous Every 48 hours 04/11/17 2230 04/12/17 0114      Assessment/Plan: Pulmonary edema Pleural effusion s/p thoracentesis AKI - Cr 2.61, BUN 33; slowly improving Anemia of chronic disease   Diverticulitis with perforation s/p IR drain - Repeat CT 04/19/17: no significant residual abscess around drain, probablecolocutaneousfistula. Stable small bowel dilation. - Afebrile, WBCimproving, almost normal - Drain20cc/24h, continue drain - Having bowel function(watery stool in rectal tube)and pain slowly improving - Continue IV abx - continue therapies - if he develops fever, increasing WBC, or worsening pain, will repeat CT -Long term plan would be to control leak and keep on abx. Hopefully have definitive surgery in 6-8 weeks, possibly with Dr. Lilia Pro at [facility] or Dr. Rosendo Gros here. Will need to increase PO  intake/protein intake.   FEN: HH/carb mod; prealbumin 7.9 on 11/8 -continueensure to TID. Encourage 5-6 small meals daily. ID: Vanc, Zosyn  VTE: SCD's, SQ heparin    LOS: 19 days    Renu Asby K. 04/30/2017

## 2017-04-30 NOTE — Progress Notes (Signed)
PROGRESS NOTE   Dominic Phillips  KDT:[id number]    DOB: February 09, 1961    DOA: 04/11/2017  PCP: No primary care provider on file.   I have briefly reviewed patients previous medical records in [facility].  Brief Narrative:  56 year old male with PMH of alcohol abuse presented to [facility] on 04/02/17 with complaints of abdominal pain without fevers, leukocytosis or diarrhea. CT abdomen revealed pericolonic inflammation/fluid collection and he was admitted for diverticular abscess. General surgery was consulted who suggested placement of percutaneous drain by IR. Cultures grew Escherichia coli. Hospital course complicated by DTs, acute respiratory failure with hypoxia/ARDS/aspiration pneumonia and circulatory shock for which he required intubation and pressors, acute kidney injury for which she required CRRT and IHD 1. Shock resolved. Renal functions improving. General surgery continue to follow. Nephrology signed off.   Assessment & Plan:   Active Problems:   Acute respiratory distress syndrome (ARDS) (HCC)   Acute kidney injury ([place])   Pressure injury of skin   Colonic diverticular abscess   COPD (chronic obstructive pulmonary disease) (HCC)   Dyspnea   HCAP (healthcare-associated pneumonia)   Acute blood loss anemia   Anemia of chronic disease   ETOH abuse   Tobacco abuse   Tachycardia   Tachypnea   Leukocytosis   Post-operative pain   Acute respiratory failure with hypoxia/ARDS/VDRF - Required intubation from 10/27 to 11/2 - S/P thoracentesis of 1.3 L. - At this stage likely multifactorial: Pulmonary edema, pleural effusions, atelectasis, pneumonia. - Remains on IV vancomycin and Zosyn. Continue IV Lasix with strict intake output monitoring and daily weights. - As per nursing, refused BiPAP overnight. Was transitioned from 8 L HFNC oxygen to 6 L this morning. Wean oxygen as tolerated to keep saturations >90%, incentive spirometry, mobilize. -Personally reviewed  repeat chest x-ray from 11/15: Stable bilateral pulmonary edema, pleural effusions and right basilar atelectasis or infiltrate. - Continue additional day of IV Lasix. Has been on IV Zosyn since 11/3 and vancomycin since 11/13. Probably adequate course at this time. Check pro calcitonin and if low/negative, DC all antibiotics and monitor.  B/L pleural effusions, right >left - Status post right thoracentesis of 1.3 L. - Likely transudative. Pleural fluid cytology shows reactive mesothelial cells. - Continue IV Lasix and follow chest x-ray periodically.  Aspiration pneumonia/HCAP - Patient had completed a course of IV Zosyn but apparently aspirated again and was placed back on IV Zosyn and vancomycin. -  Aggressive pulmonary toilet. - Please see discussion above.  Diverticulitis with perforation and abscess, S/P IR drain - Gen. 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 - Continue IV antibiotics, please see discussion above - Per surgery, plan would be to control leak and keep on antibiotics. Hopefully have definitive surgery in 6-8 weeks possibly with Dr. Lilia Pro at [facility] or Dr. Rosendo Gros here.  Nonsevere (moderate) malnutrition in the context of chronic illness - Management as per dietitian recommendations.  Acute kidney injury - Baseline creatinine unknown. Nephrology consulted. Treated with CRRT 10/27 and intermittent dialysis 1 on 11/3. Renal functions improved. PermCath removed 11/10. Nephrology signed off 11/13. - Creatinine slowly improving, 2.61 on 11/15.  Hypokalemia - Replaced.  Anemia of chronic disease - Hemoglobin stable in the mid 7 g range. By mouth iron started. Aranesp. Did receive 1 unit of PRBC this admission. Hemoglobin 7 on 11/15. Transfuse 1 unit PRBC. Discussed with patient who agrees.  Hypophosphatemia - Resolved.  Hypomagnesemia - Replaced  COPD/tobacco abuse -  No clinical  bronchospasm. Cessation counseled.  Circulatory shock - Resolved.  Alcohol abuse - No clinical withdrawal.  Hyperammonemia - Resolved.  Diarrhea  - Suspect due to diverticulitis process. Trial of Imodium, one or 2 doses. Low index of suspicion for C. difficile. Has not received Imodium ordered yesterday. Discussed with RN to ensure he gets it today. Monitor.  DVT prophylaxis: Subcutaneous heparin Code Status: Partial Family Communication: None at bedside Disposition: To be determined   Consultants:  General surgery CCM Nephrology CIR   Procedures:  CRRT IHD  Antimicrobials:  IV vancomycin IV Zosyn    Subjective: Minimal intermittent lower abdominal pain. No nausea or vomiting. Still having watery stools and rectal tube. No dyspnea or chest pain reported. As per RN, oxygen down from 8 L/m to 6 L/m. Refused BiPAP last night.  ROS: As above.  Objective:  Vitals:   04/30/17 0054 04/30/17 0614 04/30/17 0750 04/30/17 0810  BP:  105/75 (!) 132/93 (!) 132/93  Pulse: (!) 111 (!) 102 (!) 107 (!) 104  Resp: (!) 28 (!) 29 (!) 22 19  Temp:  98.7 F (37.1 C)    TempSrc:  Oral    SpO2: 91% 98% 100% 96%  Weight:  62 kg (136 lb 11 oz)    Height:        Examination:  General exam: Middle-aged male, small built, frail and chronically ill-looking sitting up comfortably in reclining chair this morning. Respiratory system: Diminished breath sounds in the bases with few basal crackles but otherwise clear to auscultation. Respiratory effort normal. No significant change compared to yesterday. Cardiovascular system: S1 & S2 heard, RRR. No JVD, murmurs, rubs, gallops or clicks. No pedal edema. Telemetry: Personally reviewed. Sinus tachycardia mostly in the 110s. Occasionaly in the 120s. Gastrointestinal system: Abdomen is nondistended, soft and nontender. No organomegaly or masses felt. Normal bowel sounds heard. Right-sided abdominal drain draining brownish liquid. Rectal tube with  liquid brown stools. Stable without change. Central nervous system: Alert and oriented. No focal neurological deficits. Stable. Extremities: Symmetric 5 x 5 power. Skin: No rashes, lesions or ulcers Psychiatry: Judgement and insight appear normal. Mood & affect appropriate.     Data Reviewed: I have personally reviewed following labs and imaging studies  CBC: Recent Labs  Lab 04/25/17 0258 04/26/17 0248 04/27/17 0335 04/28/17 0209 04/29/17 0224 04/30/17 0418  WBC 19.7* 18.4* 14.4* 14.4* 12.6* 11.4*  NEUTROABS 14.9* 14.2* 11.3* 10.5* 9.1*  --   HGB 8.1* 7.6* 7.4* 7.4* 7.5* 7.0*  HCT 24.2* 22.3* 21.9* 22.1* 22.4* 21.2*  MCV 91.7 92.1 92.0 92.5 93.7 95.1  PLT 334 336 358 381 400 076*   Basic Metabolic Panel: Recent Labs  Lab 04/26/17 0248 04/27/17 0335 04/28/17 0209 04/29/17 0224 04/30/17 0418  NA 131* 132* 133* 137 136  K 3.0* 3.1* 3.8 4.0 4.7  CL 93* 95* 97* 100* 101  CO2 25 24 25 25 23   GLUCOSE 108* 92 80 131* 99  BUN 30* 32* 34* 34* 33*  CREATININE 3.05* 2.93* 2.75* 2.73* 2.61*  CALCIUM 7.9* 7.7* 8.1* 8.3* 8.2*  MG 1.7 1.6* 1.8 1.6* 1.9  PHOS 4.6 5.0* 3.9 4.8* 5.0*   Liver Function Tests: Recent Labs  Lab 04/25/17 0258 04/26/17 0248 04/27/17 0335 04/28/17 0209 04/29/17 0224 04/30/17 0418  AST 18 18 17 19 16   --   ALT 9* 8* 8* 9* 9*  --   ALKPHOS 64 60 58 69 67  --   BILITOT 0.4 0.6 0.9 0.7 0.7  --  PROT 6.5 6.3* 5.9* 6.5 6.6  --   ALBUMIN 1.8* 1.7* 1.7* 1.8* 1.9* 1.8*   CBG: Recent Labs  Lab 04/29/17 1605 04/29/17 1939 04/29/17 2308 04/30/17 0323 04/30/17 0751  GLUCAP 103* 114* 127* 106* 94    Recent Results (from the past 240 hour(s))  Gram stain     Status: None   Collection Time: 04/23/17  3:54 PM  Result Value Ref Range Status   Specimen Description FLUID PLEURAL  Final   Special Requests NONE  Final   Gram Stain   Final    FEW WBC PRESENT,BOTH PMN AND MONONUCLEAR NO ORGANISMS SEEN    Report Status 04/23/2017 FINAL  Final    Culture, body fluid-bottle     Status: None   Collection Time: 04/23/17  3:54 PM  Result Value Ref Range Status   Specimen Description FLUID PLEURAL  Final   Special Requests NONE  Final   Culture NO GROWTH 5 DAYS  Final   Report Status 04/28/2017 FINAL  Final         Radiology Studies: Dg Chest 2 View  Result Date: 04/30/2017 CLINICAL DATA:  Acute respiratory failure with hypoxia. EXAM: CHEST  2 VIEW COMPARISON:  Radiograph of April 28, 2017. FINDINGS: Stable cardiomegaly. No pneumothorax is noted. Stable diffuse interstitial densities are noted concerning for pulmonary edema or inflammation. Minimal left basilar subsegmental atelectasis is noted. Mild left pleural effusion is noted. Stable right basilar atelectasis or infiltrate is noted with mild right pleural effusion. Bony thorax is unremarkable. IMPRESSION: Stable bilateral pulmonary edema with bilateral pleural effusions. Stable right basilar atelectasis or infiltrate is noted. Electronically Signed   By: Marijo Conception, M.D.   On: 04/30/2017 09:44   Dg Chest Port 1 View  Result Date: 04/28/2017 CLINICAL DATA:  Shortness of breath EXAM: PORTABLE CHEST 1 VIEW COMPARISON:  04/28/2017, 04/27/2017, 04/26/2017 FINDINGS: Small right-sided pleural effusion and tiny left effusion. Stable cardiomediastinal silhouette with vascular congestion. Extensive interstitial right greater than left opacity suspect for edema. More focal consolidation or pneumonia at the bases, not significantly changed. No pneumothorax. IMPRESSION: 1. Little change in bilateral effusions and diffuse interstitial and alveolar opacity suspicious for edema with possible bibasilar atelectasis or pneumonia. Electronically Signed   By: Donavan Foil M.D.   On: 04/28/2017 23:56        Scheduled Meds: . feeding supplement (ENSURE ENLIVE)  237 mL Oral QID  . folic acid  1 mg Oral QHS  . guaiFENesin  1,200 mg Oral BID  . heparin subcutaneous  5,000 Units Subcutaneous  Q8H  . ipratropium-albuterol  3 mL Nebulization BID  . mouth rinse  15 mL Mouth Rinse BID  . multivitamin with minerals  1 tablet Oral Daily  . pantoprazole  40 mg Oral QHS  . potassium chloride  40 mEq Oral TID  . sodium chloride flush  10-40 mL Intracatheter Q12H  . thiamine  100 mg Oral QHS  . traZODone  50 mg Oral QHS   Continuous Infusions: . sodium chloride 250 mL (04/29/17 2202)  . piperacillin-tazobactam (ZOSYN)  IV Stopped (04/30/17 1126)  . vancomycin 750 mg (04/29/17 1651)     LOS: 19 days     Vernell Leep, MD, FACP, [facility]. [facility] Pager [phone]  If 7PM-7AM, please contact night-coverage [email or web address] Password [email or web address] 04/30/2017, 11:59 AM

## 2017-04-30 NOTE — Progress Notes (Signed)
Inpatient Rehabilitation  Continuing to follow along for timing of medical readiness, therapy tolerance, and discharge support plan.  Called and spoke with patient's sister Collie Siad to discuss IP Rehab program, anticipated outcomes, and expected level of needed discharge support.  Collie Siad plans to discuss with family and friends and follow up with me tomorrow.  Plan to update team as I know.  Call if questions.  Carmelia Roller., CCC/SLP Admission Coordinator  [place]  Cell [phone]

## 2017-04-30 NOTE — Care Management Note (Signed)
Case Management Note  Patient Details  Name: Dominic Phillips MRN: [id number] Date of Birth: 1960-12-29  Subjective/Objective:    Pt admitted with AKI and ARDS       Action/Plan:  PTA from home.  Pt extubated 04/17/17 but still requiring CRRT.  CSW consulted for substance abuse and potential placement.  CM will continue to follow for discharge needs   Expected Discharge Date:                  Expected Discharge Plan:  [place]  In-House Referral:  Clinical Social Work  Discharge planning Services  CM Consult  Post Acute Care Choice:    Choice offered to:     DME Arranged:    DME Agency:     HH Arranged:    [place] Agency:     Status of Service:     If discussed at [employer], dates discussed:    Additional Comments:   04/30/2017 Discussed in LOS 11/15 - pt remains appropriate for continued stay.  Pt still has drain, IV lasix and IV antibiotics.  Creatinine remains elevated but improved - renal has signed off.  Pt remains on HFNC.  CM again requested palliative consult from attending.  Discharge plan continues to be CIR but likely SNF due to pts intolerance with activity.  CM will continue to follow for discharge needs  04/28/17 CM requested Palliative consult from attending  04/24/17 CM discussed pt with attending - pt is still not appropriate for discharge to a facility; post thoracentesis on yesterday pt aspirated.  CSW notified and CIR -  will continue to follow for discharge needs  04/23/17 Discussed in LOS 04/23/17 - pt remains appropriate for continued stay.  Pt has increasing pleural effusion and will require a thoracentesis, remains on IV zosyn.  Discharge plan continues to be SNF - CSW actively working placement   04/21/17 Discussed in LOS 11/6 - remains appropriate for continued stay- JP drain still in place, WBC trending up, IV antibiotics.  Pt is also being followed by renal - pt has received 1 IHD treatment with daily assessments for  additional HD sessions.  Maryclare Labrador, RN 04/30/2017, 3:32 PM

## 2017-05-01 DIAGNOSIS — Z515 Encounter for palliative care: Secondary | ICD-10-CM

## 2017-05-01 DIAGNOSIS — J9601 Acute respiratory failure with hypoxia: Secondary | ICD-10-CM

## 2017-05-01 LAB — TYPE AND SCREEN
ABO/RH(D): B POS
Antibody Screen: NEGATIVE
Unit division: 0
Unit division: 0

## 2017-05-01 LAB — BPAM RBC
Blood Product Expiration Date: 201811292359
Blood Product Expiration Date: 201811292359
ISSUE DATE / TIME: 201811151642
ISSUE DATE / TIME: 201811151745
UNIT TYPE AND RH: 7300
UNIT TYPE AND RH: 7300

## 2017-05-01 LAB — BASIC METABOLIC PANEL
Anion gap: 13 (ref 5–15)
BUN: 35 mg/dL — AB (ref 6–20)
CALCIUM: 8.2 mg/dL — AB (ref 8.9–10.3)
CO2: 23 mmol/L (ref 22–32)
Chloride: 101 mmol/L (ref 101–111)
Creatinine, Ser: 2.58 mg/dL — ABNORMAL HIGH (ref 0.61–1.24)
GFR calc Af Amer: 31 mL/min — ABNORMAL LOW (ref >60)
GFR, EST NON AFRICAN AMERICAN: 27 mL/min — AB (ref >60)
GLUCOSE: 108 mg/dL — AB (ref 65–99)
Potassium: 4.3 mmol/L (ref 3.5–5.1)
Sodium: 137 mmol/L (ref 135–145)

## 2017-05-01 LAB — CBC
HCT: 26.1 % — ABNORMAL LOW (ref 39.0–52.0)
Hemoglobin: 8.5 g/dL — ABNORMAL LOW (ref 13.0–17.0)
MCH: 30 pg (ref 26.0–34.0)
MCHC: 32.6 g/dL (ref 30.0–36.0)
MCV: 92.2 fL (ref 78.0–100.0)
PLATELETS: 433 10*3/uL — AB (ref 150–400)
RBC: 2.83 MIL/uL — ABNORMAL LOW (ref 4.22–5.81)
RDW: 18.8 % — AB (ref 11.5–15.5)
WBC: 14.7 10*3/uL — ABNORMAL HIGH (ref 4.0–10.5)

## 2017-05-01 MED ORDER — PRO-STAT SUGAR FREE PO LIQD
30.0000 mL | Freq: Two times a day (BID) | ORAL | Status: DC
  Administered 2017-05-01 – 2017-05-12 (×22): 30 mL via ORAL
  Filled 2017-05-01 (×22): qty 30

## 2017-05-01 MED ORDER — FUROSEMIDE 10 MG/ML IJ SOLN
60.0000 mg | Freq: Two times a day (BID) | INTRAMUSCULAR | Status: AC
  Administered 2017-05-01 (×2): 60 mg via INTRAVENOUS
  Filled 2017-05-01 (×2): qty 6

## 2017-05-01 MED ORDER — SACCHAROMYCES BOULARDII 250 MG PO CAPS
250.0000 mg | ORAL_CAPSULE | Freq: Two times a day (BID) | ORAL | Status: DC
  Administered 2017-05-01 – 2017-05-12 (×24): 250 mg via ORAL
  Filled 2017-05-01 (×24): qty 1

## 2017-05-01 MED ORDER — LOPERAMIDE HCL 2 MG PO CAPS
2.0000 mg | ORAL_CAPSULE | Freq: Three times a day (TID) | ORAL | Status: AC | PRN
  Administered 2017-05-01: 2 mg via ORAL
  Filled 2017-05-01: qty 1

## 2017-05-01 NOTE — Consult Note (Signed)
Consultation Note Date: 05/01/2017   Patient Name: Dominic Phillips  DOB: February 17, 1961  MRN: [id number]  Age / Sex: 56 y.o., male  PCP: No primary care provider on file. Referring Physician: Modena Jansky, MD  Reason for Consultation: Establishing goals of care and Psychosocial/spiritual support  HPI/Patient Profile: 56 y.o. male  with past medical history of COPD alcohol use disorder admitted to [facility] on 04/02/2017 with complaints of abdominal pain and diarrhea.  CT abdomen revealed pericolonic inflammation/fluid collection.  He was admitted for diverticular abscess.  General surgery has been consulted and a percutaneous drain was placed by IR.  Cultures grew E. coli.  His hospital course has been complicated by DTs, acute respiratory failure requiring intubation/pressors, aspiration pneumonia, circulatory shock; acute kidney injury which required CRRT and hemodialysis x1.  Shock has now resolved.  Renal function is slowly improving as is respiratory status. Patient is now extubated on the intermediate care unit.   Consult ordered for goals of care  Clinical Assessment and Goals of Care: Met with patient, chart reviewed.  Despite his lengthy hospitalization as well as potentially lengthy recovery.,  Patient is motivated for improvement to return home to care for his 50 year old disabled son.  Patient has been a single dad all of his son's life.  His son was born with cerebral palsy and severe mental retardation.  Patient understands best case scenario is that  we can control the leak, followed by 6-8 weeks of antibiotics as well as improving nutritional status (patient has an albumin of 1.8) then potentially surgical repair.  He is willing to go to a skilled nursing facility in order to achieve these goals  Having a disabled child, has motivated patient to pursue all interventions if there is a chance of  making him better.  Additionally, he has been told that his son would not live to be 47 years old and he is now 56 years old.  He talks about certain things such as overwhelming medical problems, being in God's hands.  At this point patient is able to speak for himself regarding healthcare wishes.  He does not have a designated healthcare power of attorney.  He does list a Rockie Neighbours as his emergency contact who I believe is the individual helping take care of his son    SUMMARY OF RECOMMENDATIONS   Confirmed partial code: No intubation, yes to BiPAP, CODE BLUE, pressors, defibrillation, CPR Goals are set in order to pursue all other interventions up to intubation including 6-8 weeks of antibiotics, skilled nursing facility to recover as well as surgery Code Status/Advance Care Planning:  Limited code    Symptom Management:   Pain: Continue with fentanyl IV 25-50 mcg every 2 hours as needed  Palliative Prophylaxis:   Aspiration, Bowel Regimen, Delirium Protocol, Eye Care, Frequent Pain Assessment, Oral Care and Turn Reposition  Additional Recommendations (Limitations, Scope, Preferences):  Full Scope Treatment except for reintubation  Psycho-social/Spiritual:   Desire for further Chaplaincy support:no  Additional Recommendations: Referral to [employer]  Prognosis:   Unable to determine  Discharge Planning: [place] for rehab with Palliative care service follow-up      Primary Diagnoses: Present on Admission: . Acute respiratory distress syndrome (ARDS) (HCC)   I have reviewed the medical record, interviewed the patient and family, and examined the patient. The following aspects are pertinent.  History reviewed. No pertinent past medical history. Social History   Socioeconomic History  . Marital status: Single    Spouse name: None  . Number of children: None  . Years of education: None  . Highest education level: None  Social Needs  .  Financial resource strain: None  . Food insecurity - worry: None  . Food insecurity - inability: None  . Transportation needs - medical: None  . Transportation needs - non-medical: None  Occupational History  . None  Tobacco Use  . Smoking status: Current Every Day Smoker    Packs/day: 3.00    Types: Cigarettes  . Smokeless tobacco: Never Used  Substance and Sexual Activity  . Alcohol use: Yes    Alcohol/week: 10.8 oz    Types: 18 Cans of beer per week  . Drug use: No  . Sexual activity: Not Currently  Other Topics Concern  . None  Social History Narrative  . None   History reviewed. No pertinent family history. Scheduled Meds: . feeding supplement (ENSURE ENLIVE)  237 mL Oral QID  . folic acid  1 mg Oral QHS  . furosemide  60 mg Intravenous Q12H  . guaiFENesin  1,200 mg Oral BID  . heparin subcutaneous  5,000 Units Subcutaneous Q8H  . mouth rinse  15 mL Mouth Rinse BID  . multivitamin with minerals  1 tablet Oral Daily  . pantoprazole  40 mg Oral QHS  . saccharomyces boulardii  250 mg Oral BID  . sodium chloride flush  10-40 mL Intracatheter Q12H  . thiamine  100 mg Oral QHS  . traZODone  50 mg Oral QHS   Continuous Infusions: . sodium chloride 250 mL (04/29/17 2202)  . piperacillin-tazobactam (ZOSYN)  IV Stopped (05/01/17 0941)   PRN Meds:.sodium chloride, acetaminophen, fentaNYL (SUBLIMAZE) injection, ipratropium-albuterol, loperamide, ondansetron (ZOFRAN) IV, sodium chloride flush Medications Prior to Admission:  Prior to Admission medications   Not on File   No Known Allergies Review of Systems  Unable to perform ROS: Other    Physical Exam  Constitutional: He is oriented to person, place, and time.  Frail, acutely ill appearing older man in no acute distress  HENT:  Head: Normocephalic and atraumatic.  Neck: Normal range of motion.  Cardiovascular: Normal rate.  Pulmonary/Chest: Effort normal.  Genitourinary:  Genitourinary Comments: Foley catheter;  Flexi-Seal  Musculoskeletal: Normal range of motion.  Neurological: He is alert and oriented to person, place, and time.  Short-term memory deficits noted at times  Skin: Skin is warm and dry.  Psychiatric: He has a normal mood and affect. His behavior is normal. Thought content normal.  Nursing note and vitals reviewed.   Vital Signs: BP 128/84 (BP Location: Right Arm)   Pulse (!) 112   Temp (!) 97.4 F (36.3 C) (Oral)   Resp (!) 28   Ht '5\' 11"'$  (1.803 m)   Wt 63.1 kg (139 lb 1.8 oz)   SpO2 96%   BMI 19.40 kg/m  Pain Assessment: No/denies pain POSS *See Group Information*: 1-Acceptable,Awake and alert Pain Score: 6    SpO2: SpO2: 96 % O2 Device:SpO2: 96 % O2 Flow Rate: .O2 Flow  Rate (L/min): 2 L/min  IO: Intake/output summary:   Intake/Output Summary (Last 24 hours) at 05/01/2017 1510 Last data filed at 05/01/2017 0347 Gross per 24 hour  Intake 554 ml  Output 1000 ml  Net -446 ml    LBM: Last BM Date: 05/01/17 Baseline Weight: Weight: 68.3 kg (150 lb 9.2 oz) Most recent weight: Weight: 63.1 kg (139 lb 1.8 oz)     Palliative Assessment/Data:   Flowsheet Rows     Most Recent Value  Intake Tab  Referral Department  Hospitalist  Unit at Time of Referral  Intermediate Care Unit  Palliative Care Primary Diagnosis  Sepsis/Infectious Disease  Date Notified  05/01/17  Palliative Care Type  New Palliative care  Reason for referral  Clarify Goals of Care  Date of Admission  04/11/17  Date first seen by Palliative Care  05/01/17  # of days Palliative referral response time  0 Day(s)  # of days IP prior to Palliative referral  20  Clinical Assessment  Palliative Performance Scale Score  40%  Pain Max last 24 hours  Not able to report  Pain Min Last 24 hours  Not able to report  Dyspnea Max Last 24 Hours  Not able to report  Dyspnea Min Last 24 hours  Not able to report  Nausea Max Last 24 Hours  Not able to report  Nausea Min Last 24 Hours  Not able to report    Anxiety Max Last 24 Hours  Not able to report  Anxiety Min Last 24 Hours  Not able to report  Other Max Last 24 Hours  Not able to report  Psychosocial & Spiritual Assessment  Palliative Care Outcomes  Patient/Family meeting held?  Yes  Who was at the meeting?  pt  Patient/Family wishes: Interventions discontinued/not started   Mechanical Ventilation      Time In: 1415 Time Out: 1510 Time Total: 55 min Greater than 50%  of this time was spent counseling and coordinating care related to the above assessment and plan. Staffed with Dr. Algis Liming Signed by: Dory Horn, NP   Please contact Palliative Medicine Team phone at [phone] for questions and concerns.  For individual provider: See Shea Evans

## 2017-05-01 NOTE — Progress Notes (Signed)
Nutrition Follow-up  DOCUMENTATION CODES:   Non-severe (moderate) malnutrition in context of chronic illness  INTERVENTION:   -Continue Ensure Enlive po QID, each supplement provides 350 kcal and 20 grams of  -Continue MVI daily -30 ml Prostat BID, each supplement provides 100 kcals and 15 grams protein  NUTRITION DIAGNOSIS:   Moderate Malnutrition related to chronic illness(ETOH abuse) as evidenced by mild fat depletion, mild muscle depletion.  Ongoing  GOAL:   Patient will meet greater than or equal to 90% of their needs  Progressing  MONITOR:   PO intake, Supplement acceptance, Labs, I & O's, Weight trends  REASON FOR ASSESSMENT:   Consult Assessment of nutrition requirement/status, Poor PO  ASSESSMENT:   56 year old male with PMH of ETOH abuse who was admitted to [facility] on 10/18 with diverticular abscess; hospital course complicated by DT's, development of a SBO, and PNA; required intubation on 10/25. He was transferred to [facility] on 10/27 with worsening renal failure, ARDS, aspiration PNA.  11/6- hypoglycemic event (CBGS: 62); reviewed nephrology note, no need for HD at this time 11/7- reviewed surgery note; plan to control leak and continue antibiotics; potential surgery in 6-8 weeks 11/8- s/p rt thoracentesis (1.3 L removed)  11/10- SLP evaluation- continue regular consistency diet with thin liquids, IJ cath removed  Case discussed with RN prior to visit. Pt in good spirits today. Per RN, pt with very limited intake of meal trays (noted 0-30% of meal intake over the past week).   Spoke with pt, who reports improved appetite. He reports eating very little off meal trays- he shares he eats cup of fruit, but can eat nothing more ("it comes right back up"). Pt enjoys Ensure supplements and reports consuming at least 3 per day. Pt is ordered Ensure QID- if he consumes 100% of supplements will consume 1400 kcals and 80 grams of protein.   Wt has been stable over  the past week, however, noted wt loss since admission, which may be related to fluid changes.   Discussed with pt importance of good meal and supplement intake to promote healing. Pt is motivated to get better.   Medications reviewed and include lasix (started 16/07), folic acid,florastor, vitamin B-1, and MVI daily.  Palliative care team following; pt now partial code.   CIR team following for potential admission when medically ready.   Labs reviewed: CBGS: 85-163  NUTRITION - FOCUSED PHYSICAL EXAM:    Most Recent Value  Orbital Region  Moderate depletion  Upper Arm Region  Moderate depletion  Thoracic and Lumbar Region  Mild depletion  Buccal Region  Mild depletion  Temple Region  Moderate depletion  Clavicle Bone Region  Moderate depletion  Clavicle and Acromion Bone Region  Moderate depletion  Scapular Bone Region  Moderate depletion  Dorsal Hand  Moderate depletion  Patellar Region  Moderate depletion  Anterior Thigh Region  Moderate depletion  Posterior Calf Region  Moderate depletion  Edema (RD Assessment)  Mild  Hair  Reviewed  Eyes  Reviewed  Mouth  Reviewed  Skin  Reviewed  Nails  Reviewed       Diet Order:  Diet heart healthy/carb modified Room service appropriate? Yes; Fluid consistency: Thin  EDUCATION NEEDS:   Education needs have been addressed  Skin:  Skin Assessment: Skin Integrity Issues: Skin Integrity Issues:: Stage I Stage I: sacrum  Last BM:  05/01/17  Height:   Ht Readings from Last 1 Encounters:  04/29/17 5\' 11"  (1.803 m)    Weight:  Wt Readings from Last 1 Encounters:  05/01/17 139 lb 1.8 oz (63.1 kg)    Ideal Body Weight:  78.2 kg  BMI:  Body mass index is 19.4 kg/m.  Estimated Nutritional Needs:   Kcal:  2000-2200  Protein:  100-115 grams  Fluid:  2-2.2 L    Harout Scheurich A. Jimmye Norman, RD, LDN, CDE Pager: [phone] After hours Pager: [phone]

## 2017-05-01 NOTE — Progress Notes (Signed)
Physical Therapy Treatment Patient Details Name: Dominic Phillips MRN: [id number] DOB: Dec 24, 1960 Today's Date: 05/01/2017    History of Present Illness Pt admitted to [facility] and transferred on 04/02/17 with c/o abdominal pain.  Pt presented with ETOH withdrawls, liver abscess and SBO on TPN.  10/23 presents with hypoxia and increased WBC count, 10/25 intubated, 10/26 scans show PNA and fluid overload, patient extubated on 04/17/17.  Chart review shows COPD, VDRF, anemia, AKI and hypokalemia in addition to previous listed statements.  PMH: ETOH abuse.  11/8 R thoracentesis.    PT Comments    Pt performed increased activity from previous session.  Pt in better spirits and more lucid during treatment.  Plan next session for PT to visit patient to readdress mobility and goals.  Pt remains appropriate for post acute rehab.      Follow Up Recommendations  CIR     Equipment Recommendations  Rolling walker with 5" wheels;3in1 (PT)    Recommendations for Other Services       Precautions / Restrictions Precautions Precautions: Fall Precaution Comments: Drain, Flexiseal, catheter Restrictions Weight Bearing Restrictions: No    Mobility  Bed Mobility Overal bed mobility: Needs Assistance Bed Mobility: Sit to Supine       Sit to supine: Min assist   General bed mobility comments: Pt in recliner on arrival and assisted back to bed post gait training.  Pt required assistance to lift B LEs against gravity and back into bed.  PTA managed lines and leads during transfer.    Transfers Overall transfer level: Needs assistance Equipment used: Rolling walker (2 wheeled) Transfers: Sit to/from Stand Sit to Stand: Min assist         General transfer comment: Cues for hand placement to and from seated surface.  Pt required cues for forward weight shifting.  MIn assist to boost into standing.    Ambulation/Gait Ambulation/Gait assistance: Min assist Ambulation Distance (Feet): 60  Feet(x2 trials.  ) Assistive device: Rolling walker (2 wheeled) Gait Pattern/deviations: Step-through pattern;Decreased step length - right;Decreased step length - left;Narrow base of support Gait velocity: slow Gait velocity interpretation: Below normal speed for age/gender General Gait Details: Cues for upper trunk control, increasing stride length, obstacle negotiation and RW management.     Stairs            Wheelchair Mobility    Modified Rankin (Stroke Patients Only)       Balance     Sitting balance-Leahy Scale: Fair       Standing balance-Leahy Scale: Poor                              Cognition Arousal/Alertness: Awake/alert Behavior During Therapy: WFL for tasks assessed/performed Overall Cognitive Status: Within Functional Limits for tasks assessed                                 General Comments: Pt appears lucid during tx today.        Exercises      General Comments        Pertinent Vitals/Pain Pain Assessment: 0-10 Pain Score: 6  Pain Location: bottom Pain Descriptors / Indicators: Aching;Grimacing;Guarding;Moaning Pain Intervention(s): Monitored during session;Repositioned    Home Living                      Prior Function  PT Goals (current goals can now be found in the care plan section) Acute Rehab PT Goals Patient Stated Goal: none discussed Potential to Achieve Goals: Good Progress towards PT goals: Progressing toward goals    Frequency    Min 3X/week      PT Plan Current plan remains appropriate    Co-evaluation              AM-PAC PT "6 Clicks" Daily Activity  Outcome Measure  Difficulty turning over in bed (including adjusting bedclothes, sheets and blankets)?: Unable Difficulty moving from lying on back to sitting on the side of the bed? : Unable Difficulty sitting down on and standing up from a chair with arms (e.g., wheelchair, bedside commode, etc,.)?:  Unable Help needed moving to and from a bed to chair (including a wheelchair)?: A Little Help needed walking in hospital room?: A Little Help needed climbing 3-5 steps with a railing? : A Lot 6 Click Score: 11    End of Session Equipment Utilized During Treatment: Oxygen;Gait belt Activity Tolerance: Patient tolerated treatment well Patient left: with call bell/phone within reach;in chair;with chair alarm set Nurse Communication: Mobility status PT Visit Diagnosis: Unsteadiness on feet (R26.81);Muscle weakness (generalized) (M62.81);Other abnormalities of gait and mobility (R26.89)     Time: 2924-4628 PT Time Calculation (min) (ACUTE ONLY): 38 min  Charges:  $Gait Training: 8-22 mins $Therapeutic Activity: 23-37 mins                    G CodesGovernor Rooks, PTA pager [phone]    Cristela Blue 05/01/2017, 5:18 PM

## 2017-05-01 NOTE — Progress Notes (Signed)
[facility] Progress Note     Subjective: CC: RLQ pain Patient with continued pain in RLQ around drain. Eating some, but has some early satiety. Enjoys ensure. Having liquid stools, has gotten some immodium which has slowed stool volume some. UOP good. VSS.   Objective: Vital signs in last 24 hours: Temp:  [98 F (36.7 C)-98.6 F (37 C)] 98.6 F (37 C) (11/16 0346) Pulse Rate:  [104-124] 118 (11/16 0346) Resp:  [16-36] 16 (11/16 0346) BP: (96-134)/(67-100) 128/84 (11/16 0346) SpO2:  [90 %-100 %] 91 % (11/16 0346) Weight:  [63.1 kg (139 lb 1.8 oz)] 63.1 kg (139 lb 1.8 oz) (11/16 0554) Last BM Date: 04/30/17  Intake/Output from previous day: 11/15 0701 - 11/16 0700 In: 554 [P.O.:120; Blood:434] Out: 1000 [Urine:1000] Intake/Output this shift: No intake/output data recorded.  PE: Gen:  Alert, NAD, pleasant Card:  Sinus tachycardia, pedal pulses 2+ BL Pulm:  Normal effort Abd: Soft, TTP in RLQ, mildly distended, bowel sounds present, no HSM, yellowish material from drain Skin: warm and dry, no rashes  Psych: A&Ox3   Lab Results:  Recent Labs    04/30/17 0418 05/01/17 0226  WBC 11.4* 14.7*  HGB 7.0* 8.5*  HCT 21.2* 26.1*  PLT 443* 433*   BMET Recent Labs    04/30/17 0418 05/01/17 0226  NA 136 137  K 4.7 4.3  CL 101 101  CO2 23 23  GLUCOSE 99 108*  BUN 33* 35*  CREATININE 2.61* 2.58*  CALCIUM 8.2* 8.2*   PT/INR No results for input(s): LABPROT, INR in the last 72 hours. CMP     Component Value Date/Time   NA 137 05/01/2017 0226   K 4.3 05/01/2017 0226   CL 101 05/01/2017 0226   CO2 23 05/01/2017 0226   GLUCOSE 108 (H) 05/01/2017 0226   BUN 35 (H) 05/01/2017 0226   CREATININE 2.58 (H) 05/01/2017 0226   CALCIUM 8.2 (L) 05/01/2017 0226   PROT 6.6 04/29/2017 0224   ALBUMIN 1.8 (L) 04/30/2017 0418   AST 16 04/29/2017 0224   ALT 9 (L) 04/29/2017 0224   ALKPHOS 67 04/29/2017 0224   BILITOT 0.7 04/29/2017 0224   GFRNONAA 27 (L)  05/01/2017 0226   GFRAA 31 (L) 05/01/2017 0226   Lipase  No results found for: LIPASE  Studies/Results: Dg Chest 2 View  Result Date: 04/30/2017 CLINICAL DATA:  Acute respiratory failure with hypoxia. EXAM: CHEST  2 VIEW COMPARISON:  Radiograph of April 28, 2017. FINDINGS: Stable cardiomegaly. No pneumothorax is noted. Stable diffuse interstitial densities are noted concerning for pulmonary edema or inflammation. Minimal left basilar subsegmental atelectasis is noted. Mild left pleural effusion is noted. Stable right basilar atelectasis or infiltrate is noted with mild right pleural effusion. Bony thorax is unremarkable. IMPRESSION: Stable bilateral pulmonary edema with bilateral pleural effusions. Stable right basilar atelectasis or infiltrate is noted. Electronically Signed   By: Marijo Conception, M.D.   On: 04/30/2017 09:44    Anti-infectives: Anti-infectives (From admission, onward)   Start     Dose/Rate Route Frequency Ordered Stop   04/28/17 1530  vancomycin (VANCOCIN) IVPB 750 mg/150 ml premix     750 mg 150 mL/hr over 60 Minutes Intravenous Every 24 hours 04/28/17 1424     04/27/17 1400  piperacillin-tazobactam (ZOSYN) IVPB 3.375 g     3.375 g 12.5 mL/hr over 240 Minutes Intravenous Every 8 hours 04/27/17 1158     04/26/17 1830  vancomycin (VANCOCIN) 1,250 mg in sodium chloride 0.9 %  250 mL IVPB  Status:  Discontinued     1,250 mg 166.7 mL/hr over 90 Minutes Intravenous Every 48 hours 04/26/17 1739 04/28/17 1424   04/24/17 1700  vancomycin (VANCOCIN) IVPB 1000 mg/200 mL premix  Status:  Discontinued     1,000 mg 200 mL/hr over 60 Minutes Intravenous  Once 04/24/17 1652 04/24/17 1656   04/24/17 1700  vancomycin (VANCOCIN) IVPB 1000 mg/200 mL premix  Status:  Discontinued     1,000 mg 200 mL/hr over 60 Minutes Intravenous Every 48 hours 04/24/17 1656 04/26/17 1739   04/23/17 1500  piperacillin-tazobactam (ZOSYN) IVPB 3.375 g  Status:  Discontinued     3.375 g 12.5 mL/hr over  240 Minutes Intravenous Every 12 hours 04/23/17 1107 04/27/17 1158   04/18/17 1800  piperacillin-tazobactam (ZOSYN) IVPB 3.375 g  Status:  Discontinued     3.375 g 12.5 mL/hr over 240 Minutes Intravenous Every 12 hours 04/18/17 1032 04/23/17 1107   04/18/17 1400  piperacillin-tazobactam (ZOSYN) IVPB 3.375 g  Status:  Discontinued     3.375 g 12.5 mL/hr over 240 Minutes Intravenous Every 8 hours 04/18/17 1031 04/18/17 1032   04/17/17 1200  piperacillin-tazobactam (ZOSYN) IVPB 3.375 g  Status:  Discontinued     3.375 g 100 mL/hr over 30 Minutes Intravenous Every 6 hours 04/17/17 1030 04/18/17 1031   04/17/17 1100  piperacillin-tazobactam (ZOSYN) IVPB 3.375 g  Status:  Discontinued     3.375 g 12.5 mL/hr over 240 Minutes Intravenous Every 8 hours 04/17/17 1019 04/17/17 1030   04/15/17 2125  Ampicillin-Sulbactam (UNASYN) 3 g in sodium chloride 0.9 % 100 mL IVPB  Status:  Discontinued     3 g 200 mL/hr over 30 Minutes Intravenous Every 8 hours 04/15/17 1529 04/17/17 1019   04/15/17 1100  ampicillin-sulbactam (UNASYN) 1.5 g in sodium chloride 0.9 % 50 mL IVPB  Status:  Discontinued     1.5 g 100 mL/hr over 30 Minutes Intravenous Every 12 hours 04/15/17 0937 04/15/17 1529   04/13/17 2200  levofloxacin (LEVAQUIN) IVPB 500 mg  Status:  Discontinued     500 mg 100 mL/hr over 60 Minutes Intravenous Every 48 hours 04/12/17 0114 04/12/17 0846   04/13/17 1800  piperacillin-tazobactam (ZOSYN) IVPB 2.25 g  Status:  Discontinued     2.25 g 100 mL/hr over 30 Minutes Intravenous Every 8 hours 04/13/17 1050 04/15/17 0937   04/12/17 0200  piperacillin-tazobactam (ZOSYN) IVPB 3.375 g  Status:  Discontinued     3.375 g 12.5 mL/hr over 240 Minutes Intravenous Every 8 hours 04/12/17 0103 04/13/17 1050   04/12/17 0000  levofloxacin (LEVAQUIN) IVPB 750 mg  Status:  Discontinued     750 mg 100 mL/hr over 90 Minutes Intravenous Every 48 hours 04/11/17 2230 04/12/17 0114       Assessment/Plan Pulmonary  edema Pleural effusion s/p thoracentesis AKI - Cr 2.58, BUN 35; slowly improving Anemia of chronic disease   Diverticulitis with perforation s/p IR drain - Repeat CT 04/19/17: no significant residual abscess around drain, probablecolocutaneousfistula. Stable small bowel dilation. - continue drain - Having bowel function(watery stool in rectal tube)and painslowlyimproving - primary stopping vanc for PNA, would continue zosyn for now with increased WBC - continue therapies - WBC up to 14.7 from 11, afebrile - may need repeat CT abdomen/pelvis -Long term plan would be to control leak and keep on abx. Hopefully have definitive surgery in 6-8 weeks, possibly with Dr. Lilia Pro at [facility] or Dr. Rosendo Gros here. Will need to increase PO intake/protein  intake.   FEN: HH/carb mod; prealbumin 7.9 on 11/8 -continueensure to TID. Encourage 5-6 small meals daily. ID: Vanc - stopped 11/16, Zosyn  VTE: SCD's, SQ heparin    LOS: 20 days    Brigid Re , [facility] Surgery 05/01/2017, 7:27 AM Pager: [phone] Consults: [phone] Mon-Fri 7:00 am-4:30 pm Sat-Sun 7:00 am-11:30 am

## 2017-05-01 NOTE — Progress Notes (Addendum)
PROGRESS NOTE   Dominic Phillips  GUR:[id number]    DOB: 1960/06/23    DOA: 04/11/2017  PCP: No primary care provider on file.   I have briefly reviewed patients previous medical records in [facility].  Brief Narrative:  56 year old male with PMH of alcohol abuse presented to [facility] on 04/02/17 with complaints of abdominal pain without fevers, leukocytosis or diarrhea. CT abdomen revealed pericolonic inflammation/fluid collection and he was admitted for diverticular abscess. General surgery was consulted who suggested placement of percutaneous drain by IR. Cultures grew Escherichia coli. Hospital course complicated by DTs, acute respiratory failure with hypoxia/ARDS/aspiration pneumonia and circulatory shock for which he required intubation and pressors, acute kidney injury for which she required CRRT and IHD 1. Shock resolved. Renal functions improving. General surgery continue to follow. Nephrology signed off.   Assessment & Plan:   Active Problems:   Acute respiratory distress syndrome (ARDS) (HCC)   Acute kidney injury ([place])   Pressure injury of skin   Colonic diverticular abscess   COPD (chronic obstructive pulmonary disease) (HCC)   Dyspnea   HCAP (healthcare-associated pneumonia)   Acute blood loss anemia   Anemia of chronic disease   ETOH abuse   Tobacco abuse   Tachycardia   Tachypnea   Leukocytosis   Post-operative pain   Acute respiratory failure with hypoxia/ARDS/VDRF - Required intubation from 10/27 to 11/2 - S/P thoracentesis of 1.3 L. - At this stage likely multifactorial: Pulmonary edema, pleural effusions, atelectasis, pneumonia. - Remains on IV vancomycin and Zosyn. Continue IV Lasix with strict intake output monitoring and daily weights. - patient has been weaned down from 15 L/m HFNC oxygen to 3 L/m. Wean oxygen as tolerated to keep saturations >90%, incentive spirometry, mobilize. -Personally reviewed repeat chest x-ray from 11/15: Stable  bilateral pulmonary edema, pleural effusions and right basilar atelectasis or infiltrate. - Continue additional day of IV Lasix. Has been on IV Zosyn since 11/3 and vancomycin since 11/13. Probably adequate course at this time. Check pro calcitonin and if low/negative, DC all antibiotics and monitor. - discontinue vancomycin. As discussed with surgical team 11/16, they recommend continuing Zosyn for abdominal process. - slowly improving. Follow-up repeat chest x-ray 11/17. Continue to mobilize as we are doing.  B/L pleural effusions, right >left - Status post right thoracentesis of 1.3 L. - Likely transudative. Pleural fluid cytology shows reactive mesothelial cells. - Continue IV Lasix and follow chest x-ray periodically.  Aspiration pneumonia/HCAP - Patient had completed a course of IV Zosyn but apparently aspirated again and was placed back on IV Zosyn and vancomycin. -  Aggressive pulmonary toilet. - Please see discussion above. Improving. Mild dry cough. No dyspnea. No fevers. Mild increase in leukocytosis without worsening of respiratory or abdominal symptoms. Pro-calcitonin 0.5.  Diverticulitis with perforation and abscess, S/P IR drain - Gen. 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 - Continue IV antibiotics, please see discussion above - Per surgery, plan would be to control leak and keep on antibiotics. Hopefully have definitive surgery in 6-8 weeks possibly with Dr. Lilia Pro at [facility] or Dr. Rosendo Gros here. - as discussed with surgical team 11/16, noted mildly elevated leukocytosis 14 K and are considering repeating CT abdomen to evaluate. No worsening of GI/abdominal symptoms.  Nonsevere (moderate) malnutrition in the context of chronic illness - Management as per dietitian recommendations.  Acute kidney injury - Baseline creatinine unknown. Nephrology consulted. Treated with CRRT 10/27 and intermittent  dialysis 1 on  11/3. Renal functions improved. PermCath removed 11/10. Nephrology signed off 11/13. - Creatinine slowly improving, 2.58 on 11/16. Continue to closely monitor daily BMP.  Hypokalemia - Replaced.  Anemia of chronic disease - Hemoglobin stable in the mid 7 g range. By mouth iron started. Aranesp. Did receive 1 unit of PRBC this admission. Hemoglobin 7 on 11/15. S/p 1 unit PRBC.  - Hb improved to 8.5  Hypophosphatemia - Resolved.  Hypomagnesemia - Replaced  COPD/tobacco abuse - No clinical bronchospasm. Cessation counseled.  Circulatory shock - Resolved.  Alcohol abuse - No clinical withdrawal.  Hyperammonemia - Resolved.  Diarrhea  - Suspect due to diverticulitis process. Brief trial of Imodium. Low index of suspicion for C. difficile. As per RN, diarrhea somewhat better. Continue when necessary Imodium. - added probiotics.  Adult failure to thrive, multifactorial palliative care consulted for goals of care.  DVT prophylaxis: Subcutaneous heparin Code Status: Partial Family Communication: Discussed with patient's sister via phone. Updated care and answered questions. Disposition: To be determined   Consultants:  General surgery CCM Nephrology CIR   Procedures:  CRRT IHD  Antimicrobials:  IV vancomycin IV Zosyn    Subjective: minimal dry cough. No dyspnea reported. Down to 3 L/m nasal cannula oxygen. No abdominal pain reported. Appetite improving and likes to drink nutritional shakes. As per RN, diarrhea slightly better after Imodium he received yesterday.  ROS: As above.  Objective:  Vitals:   05/01/17 0346 05/01/17 0554 05/01/17 0819 05/01/17 0833  BP: 128/84     Pulse: (!) 118  (!) 112   Resp: 16  (!) 28   Temp: 98.6 F (37 C)   98.1 F (36.7 C)  TempSrc: Oral   Oral  SpO2: 91%  96%   Weight:  63.1 kg (139 lb 1.8 oz)    Height:        Examination:  General exam: Middle-aged male, small built, frail and chronically  ill-looking sitting up comfortably in reclining chair this morning. Seems to be in good spirits. Respiratory system: continued improvement. Clear anteriorly. Slightly harsh posteriorly with occasional basal crackles. No wheezing or rhonchi. No increased work of breathing. Cardiovascular system: S1 & S2 heard, RRR. No JVD, murmurs, rubs, gallops or clicks. No pedal edema. Telemetry: Personally reviewed. Sinus tachycardia mostly in the 100's-110s.  Gastrointestinal system: Abdomen is nondistended, soft and nontender. No organomegaly or masses felt. Normal bowel sounds heard. Right-sided abdominal drain draining brownish liquid-decreased amounts and site looks intact without acute findings suggestive of infection. Rectal tube with liquid brown stools. Stable without change. Central nervous system: Alert and oriented. No focal neurological deficits. Stable. Extremities: Symmetric 5 x 5 power. Skin: No rashes, lesions or ulcers Psychiatry: Judgement and insight appear normal. Mood & affect appropriate.     Data Reviewed: I have personally reviewed following labs and imaging studies  CBC: Recent Labs  Lab 04/25/17 0258 04/26/17 0248 04/27/17 0335 04/28/17 0209 04/29/17 0224 04/30/17 0418 05/01/17 0226  WBC 19.7* 18.4* 14.4* 14.4* 12.6* 11.4* 14.7*  NEUTROABS 14.9* 14.2* 11.3* 10.5* 9.1*  --   --   HGB 8.1* 7.6* 7.4* 7.4* 7.5* 7.0* 8.5*  HCT 24.2* 22.3* 21.9* 22.1* 22.4* 21.2* 26.1*  MCV 91.7 92.1 92.0 92.5 93.7 95.1 92.2  PLT 334 336 358 381 400 443* 481*   Basic Metabolic Panel: Recent Labs  Lab 04/26/17 0248 04/27/17 0335 04/28/17 0209 04/29/17 0224 04/30/17 0418 05/01/17 0226  NA 131* 132* 133* 137 136 137  K 3.0* 3.1* 3.8 4.0 4.7 4.3  CL 93* 95* 97* 100* 101 101  CO2 25 24 25 25 23 23   GLUCOSE 108* 92 80 131* 99 108*  BUN 30* 32* 34* 34* 33* 35*  CREATININE 3.05* 2.93* 2.75* 2.73* 2.61* 2.58*  CALCIUM 7.9* 7.7* 8.1* 8.3* 8.2* 8.2*  MG 1.7 1.6* 1.8 1.6* 1.9  --   PHOS 4.6  5.0* 3.9 4.8* 5.0*  --    Liver Function Tests: Recent Labs  Lab 04/25/17 0258 04/26/17 0248 04/27/17 0335 04/28/17 0209 04/29/17 0224 04/30/17 0418  AST 18 18 17 19 16   --   ALT 9* 8* 8* 9* 9*  --   ALKPHOS 64 60 58 69 67  --   BILITOT 0.4 0.6 0.9 0.7 0.7  --   PROT 6.5 6.3* 5.9* 6.5 6.6  --   ALBUMIN 1.8* 1.7* 1.7* 1.8* 1.9* 1.8*   CBG: Recent Labs  Lab 04/30/17 0323 04/30/17 0751 04/30/17 1227 04/30/17 1633 04/30/17 1940  GLUCAP 106* 94 131* 85 163*    Recent Results (from the past 240 hour(s))  Gram stain     Status: None   Collection Time: 04/23/17  3:54 PM  Result Value Ref Range Status   Specimen Description FLUID PLEURAL  Final   Special Requests NONE  Final   Gram Stain   Final    FEW WBC PRESENT,BOTH PMN AND MONONUCLEAR NO ORGANISMS SEEN    Report Status 04/23/2017 FINAL  Final  Culture, body fluid-bottle     Status: None   Collection Time: 04/23/17  3:54 PM  Result Value Ref Range Status   Specimen Description FLUID PLEURAL  Final   Special Requests NONE  Final   Culture NO GROWTH 5 DAYS  Final   Report Status 04/28/2017 FINAL  Final         Radiology Studies: Dg Chest 2 View  Result Date: 04/30/2017 CLINICAL DATA:  Acute respiratory failure with hypoxia. EXAM: CHEST  2 VIEW COMPARISON:  Radiograph of April 28, 2017. FINDINGS: Stable cardiomegaly. No pneumothorax is noted. Stable diffuse interstitial densities are noted concerning for pulmonary edema or inflammation. Minimal left basilar subsegmental atelectasis is noted. Mild left pleural effusion is noted. Stable right basilar atelectasis or infiltrate is noted with mild right pleural effusion. Bony thorax is unremarkable. IMPRESSION: Stable bilateral pulmonary edema with bilateral pleural effusions. Stable right basilar atelectasis or infiltrate is noted. Electronically Signed   By: Marijo Conception, M.D.   On: 04/30/2017 09:44        Scheduled Meds: . feeding supplement (ENSURE ENLIVE)   237 mL Oral QID  . folic acid  1 mg Oral QHS  . furosemide  60 mg Intravenous Q12H  . guaiFENesin  1,200 mg Oral BID  . heparin subcutaneous  5,000 Units Subcutaneous Q8H  . mouth rinse  15 mL Mouth Rinse BID  . multivitamin with minerals  1 tablet Oral Daily  . pantoprazole  40 mg Oral QHS  . sodium chloride flush  10-40 mL Intracatheter Q12H  . thiamine  100 mg Oral QHS  . traZODone  50 mg Oral QHS   Continuous Infusions: . sodium chloride 250 mL (04/29/17 2202)  . piperacillin-tazobactam (ZOSYN)  IV Stopped (05/01/17 0941)  . vancomycin Stopped (04/30/17 1758)     LOS: 20 days     Vernell Leep, MD, FACP, [facility]. [facility] Pager [phone]  If 7PM-7AM, please contact night-coverage [email or web address] Password [email or web address] 05/01/2017, 11:17 AM

## 2017-05-02 ENCOUNTER — Inpatient Hospital Stay (HOSPITAL_COMMUNITY): Payer: Medicaid Other

## 2017-05-02 LAB — CBC
HCT: 28.9 % — ABNORMAL LOW (ref 39.0–52.0)
Hemoglobin: 9.3 g/dL — ABNORMAL LOW (ref 13.0–17.0)
MCH: 30.1 pg (ref 26.0–34.0)
MCHC: 32.2 g/dL (ref 30.0–36.0)
MCV: 93.5 fL (ref 78.0–100.0)
PLATELETS: 470 10*3/uL — AB (ref 150–400)
RBC: 3.09 MIL/uL — AB (ref 4.22–5.81)
RDW: 18.8 % — ABNORMAL HIGH (ref 11.5–15.5)
WBC: 13.3 10*3/uL — ABNORMAL HIGH (ref 4.0–10.5)

## 2017-05-02 LAB — BASIC METABOLIC PANEL
Anion gap: 14 (ref 5–15)
BUN: 34 mg/dL — AB (ref 6–20)
CHLORIDE: 97 mmol/L — AB (ref 101–111)
CO2: 25 mmol/L (ref 22–32)
Calcium: 8.2 mg/dL — ABNORMAL LOW (ref 8.9–10.3)
Creatinine, Ser: 2.55 mg/dL — ABNORMAL HIGH (ref 0.61–1.24)
GFR calc Af Amer: 31 mL/min — ABNORMAL LOW (ref >60)
GFR calc non Af Amer: 27 mL/min — ABNORMAL LOW (ref >60)
GLUCOSE: 102 mg/dL — AB (ref 65–99)
POTASSIUM: 3.3 mmol/L — AB (ref 3.5–5.1)
Sodium: 136 mmol/L (ref 135–145)

## 2017-05-02 MED ORDER — POTASSIUM CHLORIDE CRYS ER 20 MEQ PO TBCR
30.0000 meq | EXTENDED_RELEASE_TABLET | Freq: Once | ORAL | Status: AC
  Administered 2017-05-02: 21:00:00 30 meq via ORAL
  Filled 2017-05-02: qty 1

## 2017-05-02 MED ORDER — FUROSEMIDE 10 MG/ML IJ SOLN
40.0000 mg | Freq: Two times a day (BID) | INTRAMUSCULAR | Status: AC
  Administered 2017-05-02 – 2017-05-03 (×3): 40 mg via INTRAVENOUS
  Filled 2017-05-02 (×3): qty 4

## 2017-05-02 NOTE — Progress Notes (Addendum)
PROGRESS NOTE   Dominic Phillips  VPX:[id number]    DOB: 1961/04/08    DOA: 04/11/2017  PCP: No primary care provider on file.   I have briefly reviewed patients previous medical records in [facility].  Brief Narrative:  56 year old male with PMH of alcohol abuse presented to [facility] on 04/02/17 with complaints of abdominal pain without fevers, leukocytosis or diarrhea. CT abdomen revealed pericolonic inflammation/fluid collection and he was admitted for diverticular abscess. General surgery was consulted who suggested placement of percutaneous drain by IR. Cultures grew Escherichia coli. Hospital course complicated by DTs, acute respiratory failure with hypoxia/ARDS/aspiration pneumonia and circulatory shock for which he required intubation and pressors, acute kidney injury for which she required CRRT and IHD 1. Shock resolved. Renal functions improving. General surgery continue to follow. Nephrology signed off.   Assessment & Plan:   Active Problems:   Acute respiratory distress syndrome (ARDS) (HCC)   Acute kidney injury ([place])   Pressure injury of skin   Colonic diverticular abscess   COPD (chronic obstructive pulmonary disease) (HCC)   Dyspnea   HCAP (healthcare-associated pneumonia)   Acute blood loss anemia   Anemia of chronic disease   ETOH abuse   Tobacco abuse   Tachycardia   Tachypnea   Leukocytosis   Post-operative pain   Acute respiratory failure with hypoxia ([place])   Palliative care by specialist   Acute respiratory failure with hypoxia/ARDS/VDRF - multifactorial etiology -Pulmonary edema, pleural effusions, atelectasis, pneumonia. -weaned down from 15 L/m HFNC oxygen to 3 L/m. Wean oxygen as tolerated to keep saturations >90%, incentive spirometry, mobilize. - Required intubation from 10/27 to 11/2 - S/P thoracentesis of 1.3 L. -Remains on IV vancomycin and Zosyn.  -Chest x-ray 05/02/2017 indicated worsening edema -diuresis with strict intake output  monitoring and daily weights.    B/L pleural effusions, right >left - Status post right thoracentesis of 1.3 L. - Likely transudative. Pleural fluid cytology shows reactive mesothelial cells. Patient was on IV Lasix currently he is not on Lasix will give him a dose of 20 mg IV Lasix today and monitor daily..  Aspiration pneumonia/HCAP - Patient had completed a course of IV Zosyn but apparently aspirated again and was placed back on IV Zosyn and vancomycin. -  Aggressive pulmonary toilet.   Diverticulitis with perforation and abscess, S/P IR drain - Gen. 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 - Continue IV antibiotics, please see discussion above - Per surgery, plan would be to control leak and keep on antibiotics. Hopefully have definitive surgery in 6-8 weeks possibly with Dr. Lilia Pro at [facility] or Dr. Rosendo Gros here. .  Nonsevere (moderate) malnutrition in the context of chronic illness - Management as per dietitian recommendations.  Acute kidney injury - Baseline creatinine unknown. Nephrology consulted. Treated with CRRT 10/27 and intermittent dialysis 1 on 11/3. Renal functions improved. PermCath removed 11/10. Nephrology signed off 11/13. - Creatinine slowly improving, 2.58 on 11/16. Continue to closely monitor daily BMP.  Hypokalemia -Repleted Anemia of chronic disease - Hemoglobin stable in the mid 7 g range. By mouth iron started. Aranesp. Did receive 1 unit of PRBC this admission. Hemoglobin 7 on 11/15. S/p 1 unit PRBC.  - Hb improved  Hypophosphatemia - Resolved.  Hypomagnesemia - Replaced  COPD/tobacco abuse - No clinical bronchospasm. Cessation counseled.  Circulatory shock - Resolved.  Alcohol abuse - No clinical withdrawal.  Hyperammonemia - Resolved.  Diarrhea  - Suspect due to diverticulitis process.  Brief trial of Imodium. Low index of suspicion for C. difficile.   Improved, supportive care Adult failure to thrive, multifactorial palliative care consulted for goals of care.  DVT prophylaxis: Subcutaneous heparin Code Status: Partial Family Communication: Discussed with patient's sister via phone. Updated care and answered questions. Disposition: To be determined   Consultants:  General surgery CCM Nephrology CIR   Procedures:  CRRT IHD  Antimicrobials:  IV vancomycin IV Zosyn    Subjective: No fever or chills.  Cough better  ROS: As above.  Objective:  Vitals:   05/01/17 1941 05/01/17 2348 05/02/17 0348 05/02/17 0855  BP: 112/77 122/81 112/81 111/81  Pulse: (!) 106 (!) 114 (!) 117 (!) 103  Resp: (!) 21 (!) 36 (!) 29 (!) 22  Temp: 98.1 F (36.7 C) 98.1 F (36.7 C) 99 F (37.2 C) 98.9 F (37.2 C)  TempSrc: Oral Oral Oral Oral  SpO2: 92% 91% 96% 96%  Weight:   59.3 kg (130 lb 11.2 oz)   Height:        Examination:  General exam: No acute distress, chronically ill looking  respiratory system: Diminished breath sounds at the bases otherwise clear to auscultation  cardiovascular system: S1 & S2 heard, RRR. No JVD, murmurs, rubs, gallops or clicks. No pedal edema.   Gastrointestinal system: Abdomen is nondistended, soft and nontender. No organomegaly.  Normoactive bowel sounds.  . Central nervous system: Alert and oriented. No focal neurological deficits. Stable. Extremities: Symmetric 5 x 5 power. Skin: No rashes, lesions or ulcers Psychiatry: Judgement and insight appear normal. Mood & affect appropriate.     Data Reviewed: I have personally reviewed following labs and imaging studies  CBC: Recent Labs  Lab 04/26/17 0248 04/27/17 0335 04/28/17 0209 04/29/17 0224 04/30/17 0418 05/01/17 0226 05/02/17 0238  WBC 18.4* 14.4* 14.4* 12.6* 11.4* 14.7* 13.3*  NEUTROABS 14.2* 11.3* 10.5* 9.1*  --   --   --   HGB 7.6* 7.4* 7.4* 7.5* 7.0* 8.5* 9.3*  HCT 22.3* 21.9* 22.1* 22.4* 21.2* 26.1* 28.9*  MCV 92.1 92.0 92.5  93.7 95.1 92.2 93.5  PLT 336 358 381 400 443* 433* 725*   Basic Metabolic Panel: Recent Labs  Lab 04/26/17 0248 04/27/17 0335 04/28/17 0209 04/29/17 0224 04/30/17 0418 05/01/17 0226 05/02/17 0238  NA 131* 132* 133* 137 136 137 136  K 3.0* 3.1* 3.8 4.0 4.7 4.3 3.3*  CL 93* 95* 97* 100* 101 101 97*  CO2 25 24 25 25 23 23 25   GLUCOSE 108* 92 80 131* 99 108* 102*  BUN 30* 32* 34* 34* 33* 35* 34*  CREATININE 3.05* 2.93* 2.75* 2.73* 2.61* 2.58* 2.55*  CALCIUM 7.9* 7.7* 8.1* 8.3* 8.2* 8.2* 8.2*  MG 1.7 1.6* 1.8 1.6* 1.9  --   --   PHOS 4.6 5.0* 3.9 4.8* 5.0*  --   --    Liver Function Tests: Recent Labs  Lab 04/26/17 0248 04/27/17 0335 04/28/17 0209 04/29/17 0224 04/30/17 0418  AST 18 17 19 16   --   ALT 8* 8* 9* 9*  --   ALKPHOS 60 58 69 67  --   BILITOT 0.6 0.9 0.7 0.7  --   PROT 6.3* 5.9* 6.5 6.6  --   ALBUMIN 1.7* 1.7* 1.8* 1.9* 1.8*   CBG: Recent Labs  Lab 04/30/17 0323 04/30/17 0751 04/30/17 1227 04/30/17 1633 04/30/17 1940  GLUCAP 106* 94 131* 85 163*    Recent Results (from the past 240 hour(s))  Gram stain  Status: None   Collection Time: 04/23/17  3:54 PM  Result Value Ref Range Status   Specimen Description FLUID PLEURAL  Final   Special Requests NONE  Final   Gram Stain   Final    FEW WBC PRESENT,BOTH PMN AND MONONUCLEAR NO ORGANISMS SEEN    Report Status 04/23/2017 FINAL  Final  Culture, body fluid-bottle     Status: None   Collection Time: 04/23/17  3:54 PM  Result Value Ref Range Status   Specimen Description FLUID PLEURAL  Final   Special Requests NONE  Final   Culture NO GROWTH 5 DAYS  Final   Report Status 04/28/2017 FINAL  Final         Radiology Studies: Dg Chest 2 View  Result Date: 05/02/2017 CLINICAL DATA:  Hospital acquired pneumonia EXAM: CHEST  2 VIEW COMPARISON:  04/30/2017 FINDINGS: Diffuse bilateral airspace opacities have worsened since prior study. Heart is upper limits normal in size. Small bilateral pleural  effusions, right greater than left. IMPRESSION: Worsening diffuse bilateral airspace disease which could represent infection or edema. Small bilateral effusions. Electronically Signed   By: Rolm Baptise M.D.   On: 05/02/2017 09:28        Scheduled Meds: . feeding supplement (ENSURE ENLIVE)  237 mL Oral QID  . feeding supplement (PRO-STAT SUGAR FREE 64)  30 mL Oral BID  . folic acid  1 mg Oral QHS  . guaiFENesin  1,200 mg Oral BID  . heparin subcutaneous  5,000 Units Subcutaneous Q8H  . mouth rinse  15 mL Mouth Rinse BID  . multivitamin with minerals  1 tablet Oral Daily  . pantoprazole  40 mg Oral QHS  . saccharomyces boulardii  250 mg Oral BID  . sodium chloride flush  10-40 mL Intracatheter Q12H  . thiamine  100 mg Oral QHS  . traZODone  50 mg Oral QHS   Continuous Infusions: . sodium chloride 250 mL (04/29/17 2202)  . piperacillin-tazobactam (ZOSYN)  IV Stopped (05/02/17 0956)     LOS: 21 days     OSEI-BONSU,GEORGE, MD, FACP, FHM. [facility] Pager [phone]  If 7PM-7AM, please contact night-coverage [email or web address] Password [email or web address] 05/02/2017, 12:01 PM

## 2017-05-02 NOTE — Progress Notes (Signed)
Subjective/Chief Complaint: No acute changes overnight Minimal abdominal pain   Objective: Vital signs in last 24 hours: Temp:  [97.4 F (36.3 C)-99 F (37.2 C)] 99 F (37.2 C) (11/17 0348) Pulse Rate:  [106-117] 117 (11/17 0348) Resp:  [21-36] 29 (11/17 0348) BP: (112-125)/(77-87) 112/81 (11/17 0348) SpO2:  [91 %-96 %] 96 % (11/17 0348) Weight:  [59.3 kg (130 lb 11.2 oz)] 59.3 kg (130 lb 11.2 oz) (11/17 0348) Last BM Date: 05/01/17  Intake/Output from previous day: 11/16 0701 - 11/17 0700 In: 220 [P.O.:120] Out: 2225 [Urine:2150; Drains:75] Intake/Output this shift: No intake/output data recorded.  Exam: Awake and alert Appears comfortable Abdomen mildly full, minimally tender, drain consistency unchanged  Lab Results:  Recent Labs    05/01/17 0226 05/02/17 0238  WBC 14.7* 13.3*  HGB 8.5* 9.3*  HCT 26.1* 28.9*  PLT 433* 470*   BMET Recent Labs    05/01/17 0226 05/02/17 0238  NA 137 136  K 4.3 3.3*  CL 101 97*  CO2 23 25  GLUCOSE 108* 102*  BUN 35* 34*  CREATININE 2.58* 2.55*  CALCIUM 8.2* 8.2*   PT/INR No results for input(s): LABPROT, INR in the last 72 hours. ABG No results for input(s): PHART, HCO3 in the last 72 hours.  Invalid input(s): PCO2, PO2  Studies/Results: Dg Chest 2 View  Result Date: 04/30/2017 CLINICAL DATA:  Acute respiratory failure with hypoxia. EXAM: CHEST  2 VIEW COMPARISON:  Radiograph of April 28, 2017. FINDINGS: Stable cardiomegaly. No pneumothorax is noted. Stable diffuse interstitial densities are noted concerning for pulmonary edema or inflammation. Minimal left basilar subsegmental atelectasis is noted. Mild left pleural effusion is noted. Stable right basilar atelectasis or infiltrate is noted with mild right pleural effusion. Bony thorax is unremarkable. IMPRESSION: Stable bilateral pulmonary edema with bilateral pleural effusions. Stable right basilar atelectasis or infiltrate is noted. Electronically Signed    By: Marijo Conception, M.D.   On: 04/30/2017 09:44    Anti-infectives: Anti-infectives (From admission, onward)   Start     Dose/Rate Route Frequency Ordered Stop   04/28/17 1530  vancomycin (VANCOCIN) IVPB 750 mg/150 ml premix  Status:  Discontinued     750 mg 150 mL/hr over 60 Minutes Intravenous Every 24 hours 04/28/17 1424 05/01/17 1128   04/27/17 1400  piperacillin-tazobactam (ZOSYN) IVPB 3.375 g     3.375 g 12.5 mL/hr over 240 Minutes Intravenous Every 8 hours 04/27/17 1158     04/26/17 1830  vancomycin (VANCOCIN) 1,250 mg in sodium chloride 0.9 % 250 mL IVPB  Status:  Discontinued     1,250 mg 166.7 mL/hr over 90 Minutes Intravenous Every 48 hours 04/26/17 1739 04/28/17 1424   04/24/17 1700  vancomycin (VANCOCIN) IVPB 1000 mg/200 mL premix  Status:  Discontinued     1,000 mg 200 mL/hr over 60 Minutes Intravenous  Once 04/24/17 1652 04/24/17 1656   04/24/17 1700  vancomycin (VANCOCIN) IVPB 1000 mg/200 mL premix  Status:  Discontinued     1,000 mg 200 mL/hr over 60 Minutes Intravenous Every 48 hours 04/24/17 1656 04/26/17 1739   04/23/17 1500  piperacillin-tazobactam (ZOSYN) IVPB 3.375 g  Status:  Discontinued     3.375 g 12.5 mL/hr over 240 Minutes Intravenous Every 12 hours 04/23/17 1107 04/27/17 1158   04/18/17 1800  piperacillin-tazobactam (ZOSYN) IVPB 3.375 g  Status:  Discontinued     3.375 g 12.5 mL/hr over 240 Minutes Intravenous Every 12 hours 04/18/17 1032 04/23/17 1107   04/18/17 1400  piperacillin-tazobactam (ZOSYN) IVPB 3.375 g  Status:  Discontinued     3.375 g 12.5 mL/hr over 240 Minutes Intravenous Every 8 hours 04/18/17 1031 04/18/17 1032   04/17/17 1200  piperacillin-tazobactam (ZOSYN) IVPB 3.375 g  Status:  Discontinued     3.375 g 100 mL/hr over 30 Minutes Intravenous Every 6 hours 04/17/17 1030 04/18/17 1031   04/17/17 1100  piperacillin-tazobactam (ZOSYN) IVPB 3.375 g  Status:  Discontinued     3.375 g 12.5 mL/hr over 240 Minutes Intravenous Every 8 hours  04/17/17 1019 04/17/17 1030   04/15/17 2125  Ampicillin-Sulbactam (UNASYN) 3 g in sodium chloride 0.9 % 100 mL IVPB  Status:  Discontinued     3 g 200 mL/hr over 30 Minutes Intravenous Every 8 hours 04/15/17 1529 04/17/17 1019   04/15/17 1100  ampicillin-sulbactam (UNASYN) 1.5 g in sodium chloride 0.9 % 50 mL IVPB  Status:  Discontinued     1.5 g 100 mL/hr over 30 Minutes Intravenous Every 12 hours 04/15/17 0937 04/15/17 1529   04/13/17 2200  levofloxacin (LEVAQUIN) IVPB 500 mg  Status:  Discontinued     500 mg 100 mL/hr over 60 Minutes Intravenous Every 48 hours 04/12/17 0114 04/12/17 0846   04/13/17 1800  piperacillin-tazobactam (ZOSYN) IVPB 2.25 g  Status:  Discontinued     2.25 g 100 mL/hr over 30 Minutes Intravenous Every 8 hours 04/13/17 1050 04/15/17 0937   04/12/17 0200  piperacillin-tazobactam (ZOSYN) IVPB 3.375 g  Status:  Discontinued     3.375 g 12.5 mL/hr over 240 Minutes Intravenous Every 8 hours 04/12/17 0103 04/13/17 1050   04/12/17 0000  levofloxacin (LEVAQUIN) IVPB 750 mg  Status:  Discontinued     750 mg 100 mL/hr over 90 Minutes Intravenous Every 48 hours 04/11/17 2230 04/12/17 0114      Assessment/Plan:  Diverticulitis with perf s/p IR drain  WBC lower today so will hold on a CT scan.  Continuing IV antibiotics and conservative management  LOS: 21 days    Tinisha Etzkorn A 05/02/2017

## 2017-05-02 NOTE — Progress Notes (Signed)
Pt had 13 bts non sustained SVT.

## 2017-05-03 ENCOUNTER — Inpatient Hospital Stay (HOSPITAL_COMMUNITY): Payer: Medicaid Other

## 2017-05-03 LAB — CBC
HEMATOCRIT: 30.1 % — AB (ref 39.0–52.0)
Hemoglobin: 9.8 g/dL — ABNORMAL LOW (ref 13.0–17.0)
MCH: 30.3 pg (ref 26.0–34.0)
MCHC: 32.6 g/dL (ref 30.0–36.0)
MCV: 93.2 fL (ref 78.0–100.0)
Platelets: 505 10*3/uL — ABNORMAL HIGH (ref 150–400)
RBC: 3.23 MIL/uL — ABNORMAL LOW (ref 4.22–5.81)
RDW: 17.6 % — AB (ref 11.5–15.5)
WBC: 15.2 10*3/uL — ABNORMAL HIGH (ref 4.0–10.5)

## 2017-05-03 LAB — MAGNESIUM: Magnesium: 1.6 mg/dL — ABNORMAL LOW (ref 1.7–2.4)

## 2017-05-03 LAB — GLUCOSE, CAPILLARY: GLUCOSE-CAPILLARY: 91 mg/dL (ref 65–99)

## 2017-05-03 MED ORDER — PIPERACILLIN-TAZOBACTAM 3.375 G IVPB
3.3750 g | Freq: Three times a day (TID) | INTRAVENOUS | Status: DC
  Administered 2017-05-04 – 2017-05-20 (×50): 3.375 g via INTRAVENOUS
  Filled 2017-05-03 (×52): qty 50

## 2017-05-03 MED ORDER — FUROSEMIDE 10 MG/ML IJ SOLN
20.0000 mg | Freq: Once | INTRAMUSCULAR | Status: AC
  Administered 2017-05-03: 20 mg via INTRAVENOUS
  Filled 2017-05-03: qty 2

## 2017-05-03 MED ORDER — MAGNESIUM SULFATE 4 GM/100ML IV SOLN
4.0000 g | Freq: Once | INTRAVENOUS | Status: AC
  Administered 2017-05-03: 4 g via INTRAVENOUS
  Filled 2017-05-03: qty 100

## 2017-05-03 NOTE — Progress Notes (Signed)
PROGRESS NOTE    Dominic Phillips  BPZ:[id number] DOB: July 20, 1960 DOA: 04/11/2017 PCP: No primary care provider on file.  Brief Narrative: 56 year old male with PMH of alcohol abuse presented to [facility] on 04/02/17 with complaints of abdominal pain without fevers, leukocytosis or diarrhea. CT abdomen revealed pericolonic inflammation/fluid collection and he was admitted for diverticular abscess. General surgery was consulted who suggested placement of percutaneous drain by IR. Cultures grew Escherichia coli. Hospital course complicated by DTs, acute respiratory failure with hypoxia/ARDS/aspiration pneumonia and circulatory shock for which he required intubation and pressors, acute kidney injury for which she required CRRT and IHD 1. Shock resolved. Renal functions improving. General surgery continue to follow. Nephrology signed off.     Assessment & Plan:   Active Problems:   Acute respiratory distress syndrome (ARDS) (HCC)   Acute kidney injury ([place])   Pressure injury of skin   Colonic diverticular abscess   COPD (chronic obstructive pulmonary disease) (HCC)   Dyspnea and respiratory abnormalities   HCAP (healthcare-associated pneumonia)   Acute blood loss anemia   Anemia of chronic disease   ETOH abuse   Tobacco abuse   Tachycardia   Tachypnea   Leukocytosis   Post-operative pain   Acute respiratory failure with hypoxia ([place])   Palliative care by specialist  Acute respiratory failure with hypoxia/ARDS/VDRF - multifactorial etiology -Pulmonary edema, pleural effusions, atelectasis, pneumonia. -weaned down from 15 L/m HFNC oxygen to 3 L/m. Wean oxygen as tolerated to keep saturations >90%, incentive spirometry, mobilize. - Required intubation from 10/27 to 11/2 - S/P thoracentesis of 1.3 L. -Remains on IV vancomycin and Zosyn.  -Chest x-ray 05/02/2017 indicated worsening edema -diuresis with strict intake output monitoring and daily weights.  Chest x-ray 1118Continued  layering right effusion with right lower lobe atelectasis or pneumonia. Suspect mild interstitial edema.  He had 1.3 L of fluid taken out from the right thoracentesis.  It was thought to be due to transudate.  Patient is still on Vanco and Zosyn for recurrent aspiration pneumonia.   Diverticulitis with perforation and abscess, S/P IR drain - Gen. 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 - Continue IV antibiotics - Per surgery, plan would be to control leak and keep on antibiotics. Hopefully have definitive surgery in 6-8 weeks possibly with Dr. Lilia Pro at [facility] or Dr. Rosendo Gros here.  Consider CT scan of the abdomen if patient spikes temperature or continued increasing WBC count.RLQ percutaneous drain in place  Acute kidney injury status post CRRT 1027 and intermittent dialysis.  Renal functions improved permacath removed 1110.  Nephrology has signed off at this time.  Monitor renal functions.  Anemia of chronic disease.  Continue iron replacement.  Status post 1 unit of blood transfusion.  Aranesp.  Moderate malnutrition with chronic illness and alcohol abuse-nutrition consult noted.  Continue pravastatin Ensure.  Of alcohol abuse hospital stay was complicated with DTs requiring intubation.  Currently stable.  Diarrhea probably related to diverticulitis. .        DVT prophylaxis: Subcu heparin Code Status: Partial code  family Communication: None Disposition Plan: TBD  Consultants: General surgery, nephrology, CCM, CIR Procedures: CRRT and IHD Antimicrobials: Vancomycin and Zosyn  Subjective: Denies chest pain shortness of breath nausea or vomiting   Objective: He is sitting up in his chair watching TV no acute distress he is verytachycardic [id number] he reports that he has always been tachycardic from the time he was a child. Vitals:   05/03/17 0000  05/03/17 0258 05/03/17 0400 05/03/17 0750  BP:  108/77 108/81 106/69 105/73  Pulse: (!) 106 (!) 105 (!) 108 99  Resp: (!) 25 (!) 24 (!) 32 (!) 29  Temp:  97.8 F (36.6 C)  98.2 F (36.8 C)  TempSrc:  Oral  Oral  SpO2: 94% 99% 100% 96%  Weight:  57.1 kg (125 lb 14.1 oz)    Height:        Intake/Output Summary (Last 24 hours) at 05/03/2017 1236 Last data filed at 05/03/2017 0947 Gross per 24 hour  Intake 730 ml  Output 2430 ml  Net -1700 ml   Filed Weights   05/01/17 0554 05/02/17 0348 05/03/17 0258  Weight: 63.1 kg (139 lb 1.8 oz) 59.3 kg (130 lb 11.2 oz) 57.1 kg (125 lb 14.1 oz)    Examination:  General exam: Appears calm and comfortable  Respiratory system: Clear to auscultation. Respiratory effort normal. Cardiovascular system: S1 & S2 heard, RRR. No JVD, murmurs, rubs, gallops or clicks. No pedal edema. Gastrointestinal system: Abdomen is nondistended, soft and nontender. No organomegaly or masses felt. Normal bowel sounds heard. Central nervous system: Alert and oriented. No focal neurological deficits. Extremities: Symmetric 5 x 5 power. Skin: No rashes, lesions or ulcers Psychiatry: Judgement and insight appear normal. Mood & affect appropriate.     Data Reviewed: I have personally reviewed following labs and imaging studies  CBC: Recent Labs  Lab 04/27/17 0335 04/28/17 0209 04/29/17 0224 04/30/17 0418 05/01/17 0226 05/02/17 0238 05/03/17 0319  WBC 14.4* 14.4* 12.6* 11.4* 14.7* 13.3* 15.2*  NEUTROABS 11.3* 10.5* 9.1*  --   --   --   --   HGB 7.4* 7.4* 7.5* 7.0* 8.5* 9.3* 9.8*  HCT 21.9* 22.1* 22.4* 21.2* 26.1* 28.9* 30.1*  MCV 92.0 92.5 93.7 95.1 92.2 93.5 93.2  PLT 358 381 400 443* 433* 470* 696*   Basic Metabolic Panel: Recent Labs  Lab 04/27/17 0335 04/28/17 0209 04/29/17 0224 04/30/17 0418 05/01/17 0226 05/02/17 0238  NA 132* 133* 137 136 137 136  K 3.1* 3.8 4.0 4.7 4.3 3.3*  CL 95* 97* 100* 101 101 97*  CO2 24 25 25 23 23 25   GLUCOSE 92 80 131* 99 108* 102*  BUN 32* 34* 34* 33* 35*  34*  CREATININE 2.93* 2.75* 2.73* 2.61* 2.58* 2.55*  CALCIUM 7.7* 8.1* 8.3* 8.2* 8.2* 8.2*  MG 1.6* 1.8 1.6* 1.9  --   --   PHOS 5.0* 3.9 4.8* 5.0*  --   --    GFR: Estimated Creatinine Clearance: 26.7 mL/min (A) (by C-G formula based on SCr of 2.55 mg/dL (H)). Liver Function Tests: Recent Labs  Lab 04/27/17 0335 04/28/17 0209 04/29/17 0224 04/30/17 0418  AST 17 19 16   --   ALT 8* 9* 9*  --   ALKPHOS 58 69 67  --   BILITOT 0.9 0.7 0.7  --   PROT 5.9* 6.5 6.6  --   ALBUMIN 1.7* 1.8* 1.9* 1.8*   No results for input(s): LIPASE, AMYLASE in the last 168 hours. Recent Labs  Lab 04/27/17 0335  AMMONIA 37*   Coagulation Profile: No results for input(s): INR, PROTIME in the last 168 hours. Cardiac Enzymes: No results for input(s): CKTOTAL, CKMB, CKMBINDEX, TROPONINI in the last 168 hours. BNP (last 3 results) No results for input(s): PROBNP in the last 8760 hours. HbA1C: No results for input(s): HGBA1C in the last 72 hours. CBG: Recent Labs  Lab 04/30/17 0323 04/30/17 0751 04/30/17 1227 04/30/17  1633 04/30/17 1940  GLUCAP 106* 94 131* 85 163*   Lipid Profile: No results for input(s): CHOL, HDL, LDLCALC, TRIG, CHOLHDL, LDLDIRECT in the last 72 hours. Thyroid Function Tests: No results for input(s): TSH, T4TOTAL, FREET4, T3FREE, THYROIDAB in the last 72 hours. Anemia Panel: No results for input(s): VITAMINB12, FOLATE, FERRITIN, TIBC, IRON, RETICCTPCT in the last 72 hours. Sepsis Labs: Recent Labs  Lab 04/30/17 1209  PROCALCITON 0.50    Recent Results (from the past 240 hour(s))  Gram stain     Status: None   Collection Time: 04/23/17  3:54 PM  Result Value Ref Range Status   Specimen Description FLUID PLEURAL  Final   Special Requests NONE  Final   Gram Stain   Final    FEW WBC PRESENT,BOTH PMN AND MONONUCLEAR NO ORGANISMS SEEN    Report Status 04/23/2017 FINAL  Final  Culture, body fluid-bottle     Status: None   Collection Time: 04/23/17  3:54 PM    Result Value Ref Range Status   Specimen Description FLUID PLEURAL  Final   Special Requests NONE  Final   Culture NO GROWTH 5 DAYS  Final   Report Status 04/28/2017 FINAL  Final         Radiology Studies: Dg Chest 2 View  Result Date: 05/02/2017 CLINICAL DATA:  Hospital acquired pneumonia EXAM: CHEST  2 VIEW COMPARISON:  04/30/2017 FINDINGS: Diffuse bilateral airspace opacities have worsened since prior study. Heart is upper limits normal in size. Small bilateral pleural effusions, right greater than left. IMPRESSION: Worsening diffuse bilateral airspace disease which could represent infection or edema. Small bilateral effusions. Electronically Signed   By: Rolm Baptise M.D.   On: 05/02/2017 09:28   Dg Chest Port 1 View  Result Date: 05/03/2017 CLINICAL DATA:  Hypoxemia EXAM: PORTABLE CHEST 1 VIEW COMPARISON:  05/02/2017 FINDINGS: Heart is borderline in size. Layering right pleural effusion with right lower lobe airspace opacity is similar prior study. Diffuse interstitial prominence throughout the lungs could reflect interstitial edema. Left base atelectasis. IMPRESSION: Continued layering right effusion with right lower lobe atelectasis or pneumonia. Suspect mild interstitial edema. Left base atelectasis. Electronically Signed   By: Rolm Baptise M.D.   On: 05/03/2017 08:54        Scheduled Meds: . feeding supplement (ENSURE ENLIVE)  237 mL Oral QID  . feeding supplement (PRO-STAT SUGAR FREE 64)  30 mL Oral BID  . folic acid  1 mg Oral QHS  . furosemide  40 mg Intravenous Q12H  . guaiFENesin  1,200 mg Oral BID  . heparin subcutaneous  5,000 Units Subcutaneous Q8H  . mouth rinse  15 mL Mouth Rinse BID  . multivitamin with minerals  1 tablet Oral Daily  . pantoprazole  40 mg Oral QHS  . saccharomyces boulardii  250 mg Oral BID  . sodium chloride flush  10-40 mL Intracatheter Q12H  . thiamine  100 mg Oral QHS  . traZODone  50 mg Oral QHS   Continuous Infusions: . sodium  chloride 250 mL (04/29/17 2202)  . piperacillin-tazobactam (ZOSYN)  IV Stopped (05/03/17 1019)     LOS: 22 days    Georgette Shell, MD [facility]  If 7PM-7AM, please contact night-coverage [email or web address] Password [facility] 05/03/2017, 12:36 PM

## 2017-05-03 NOTE — Progress Notes (Signed)
[facility] Progress Note    Subjective: Patient doing okay.  No complaints.  Still has FlexiSeal in place.  RLQ percutaneous drain in place.  Objective: Vital signs in last 24 hours: Temp:  [97.8 F (36.6 C)-99.3 F (37.4 C)] 97.8 F (36.6 C) (11/18 0258) Pulse Rate:  [103-114] 108 (11/18 0400) Resp:  [22-35] 32 (11/18 0400) BP: (103-124)/(69-98) 106/69 (11/18 0400) SpO2:  [92 %-100 %] 100 % (11/18 0400) Weight:  [57.1 kg (125 lb 14.1 oz)] 57.1 kg (125 lb 14.1 oz) (11/18 0258) Last BM Date: (Flexiseal intact)  Intake/Output from previous day: 11/17 0701 - 11/18 0700 In: 500 [P.O.:440; I.V.:10; IV Piggyback:50] Out: 2575 [DJMEQ:6834; Drains:25; Stool:600] Intake/Output this shift: No intake/output data recorded.  General: No complaints.  Lungs: Clear  Abd: Mildly distended.  RLQ percutaneous drain in place.  Feculent drainage.  FlexiSeal  In place which has been in place for a long time.  Extremities: No changed  Neuro: Intact  Lab Results:  @LABLAST2 (wbc:2,hgb:2,hct:2,plt:2) BMET ) Recent Labs    05/01/17 0226 05/02/17 0238  NA 137 136  K 4.3 3.3*  CL 101 97*  CO2 23 25  GLUCOSE 108* 102*  BUN 35* 34*  CREATININE 2.58* 2.55*  CALCIUM 8.2* 8.2*   PT/INR No results for input(s): LABPROT, INR in the last 72 hours. ABG No results for input(s): PHART, HCO3 in the last 72 hours.  Invalid input(s): PCO2, PO2  Studies/Results: Dg Chest 2 View  Result Date: 05/02/2017 CLINICAL DATA:  Hospital acquired pneumonia EXAM: CHEST  2 VIEW COMPARISON:  04/30/2017 FINDINGS: Diffuse bilateral airspace opacities have worsened since prior study. Heart is upper limits normal in size. Small bilateral pleural effusions, right greater than left. IMPRESSION: Worsening diffuse bilateral airspace disease which could represent infection or edema. Small bilateral effusions. Electronically Signed   By: Rolm Baptise M.D.   On: 05/02/2017 09:28    Anti-infectives: Anti-infectives  (From admission, onward)   Start     Dose/Rate Route Frequency Ordered Stop   04/28/17 1530  vancomycin (VANCOCIN) IVPB 750 mg/150 ml premix  Status:  Discontinued     750 mg 150 mL/hr over 60 Minutes Intravenous Every 24 hours 04/28/17 1424 05/01/17 1128   04/27/17 1400  piperacillin-tazobactam (ZOSYN) IVPB 3.375 g     3.375 g 12.5 mL/hr over 240 Minutes Intravenous Every 8 hours 04/27/17 1158     04/26/17 1830  vancomycin (VANCOCIN) 1,250 mg in sodium chloride 0.9 % 250 mL IVPB  Status:  Discontinued     1,250 mg 166.7 mL/hr over 90 Minutes Intravenous Every 48 hours 04/26/17 1739 04/28/17 1424   04/24/17 1700  vancomycin (VANCOCIN) IVPB 1000 mg/200 mL premix  Status:  Discontinued     1,000 mg 200 mL/hr over 60 Minutes Intravenous  Once 04/24/17 1652 04/24/17 1656   04/24/17 1700  vancomycin (VANCOCIN) IVPB 1000 mg/200 mL premix  Status:  Discontinued     1,000 mg 200 mL/hr over 60 Minutes Intravenous Every 48 hours 04/24/17 1656 04/26/17 1739   04/23/17 1500  piperacillin-tazobactam (ZOSYN) IVPB 3.375 g  Status:  Discontinued     3.375 g 12.5 mL/hr over 240 Minutes Intravenous Every 12 hours 04/23/17 1107 04/27/17 1158   04/18/17 1800  piperacillin-tazobactam (ZOSYN) IVPB 3.375 g  Status:  Discontinued     3.375 g 12.5 mL/hr over 240 Minutes Intravenous Every 12 hours 04/18/17 1032 04/23/17 1107   04/18/17 1400  piperacillin-tazobactam (ZOSYN) IVPB 3.375 g  Status:  Discontinued  3.375 g 12.5 mL/hr over 240 Minutes Intravenous Every 8 hours 04/18/17 1031 04/18/17 1032   04/17/17 1200  piperacillin-tazobactam (ZOSYN) IVPB 3.375 g  Status:  Discontinued     3.375 g 100 mL/hr over 30 Minutes Intravenous Every 6 hours 04/17/17 1030 04/18/17 1031   04/17/17 1100  piperacillin-tazobactam (ZOSYN) IVPB 3.375 g  Status:  Discontinued     3.375 g 12.5 mL/hr over 240 Minutes Intravenous Every 8 hours 04/17/17 1019 04/17/17 1030   04/15/17 2125  Ampicillin-Sulbactam (UNASYN) 3 g in sodium  chloride 0.9 % 100 mL IVPB  Status:  Discontinued     3 g 200 mL/hr over 30 Minutes Intravenous Every 8 hours 04/15/17 1529 04/17/17 1019   04/15/17 1100  ampicillin-sulbactam (UNASYN) 1.5 g in sodium chloride 0.9 % 50 mL IVPB  Status:  Discontinued     1.5 g 100 mL/hr over 30 Minutes Intravenous Every 12 hours 04/15/17 0937 04/15/17 1529   04/13/17 2200  levofloxacin (LEVAQUIN) IVPB 500 mg  Status:  Discontinued     500 mg 100 mL/hr over 60 Minutes Intravenous Every 48 hours 04/12/17 0114 04/12/17 0846   04/13/17 1800  piperacillin-tazobactam (ZOSYN) IVPB 2.25 g  Status:  Discontinued     2.25 g 100 mL/hr over 30 Minutes Intravenous Every 8 hours 04/13/17 1050 04/15/17 0937   04/12/17 0200  piperacillin-tazobactam (ZOSYN) IVPB 3.375 g  Status:  Discontinued     3.375 g 12.5 mL/hr over 240 Minutes Intravenous Every 8 hours 04/12/17 0103 04/13/17 1050   04/12/17 0000  levofloxacin (LEVAQUIN) IVPB 750 mg  Status:  Discontinued     750 mg 100 mL/hr over 90 Minutes Intravenous Every 48 hours 04/11/17 2230 04/12/17 0114      Assessment/Plan: s/p  WBC up a bit today, but the patient is afebrile with Tmax 99.4  If the trend is still increasing leukocytosis, by need CT tomorrow.  He will need surgery during this admission likely. By nurses' report the patient is not eating well.  May need supplemental TPN.  LOS: 22 days   Kathryne Eriksson. Dahlia Bailiff, MD, FACS [phone] [facility] 05/03/2017

## 2017-05-03 NOTE — Progress Notes (Signed)
Patient transferring to 5W. Report given to [place], Therapist, sports. Patient updated; all personal belongings gathered and transported with patient.

## 2017-05-03 NOTE — Progress Notes (Signed)
NURSING PROGRESS NOTE  Dominic Phillips [id number] Admission Data: 05/03/2017 6:43 PM Attending Provider: Georgette Shell, MD PCP:No primary care provider on file. Code Status: partial code  Allergies:  Patient has no known allergies. Past Medical History:   has no past medical history on file. Past Surgical History:   has a past surgical history that includes IR THORACENTESIS ASP PLEURAL SPACE W/IMG GUIDE (04/23/2017). Social History:   reports that he has been smoking cigarettes.  He has been smoking about 3.00 packs per day. he has never used smokeless tobacco. He reports that he drinks about 10.8 oz of alcohol per week. He reports that he does not use drugs.  Dominic Phillips is a 56 y.o. male patient admitted from ED:   Last Documented Vital Signs: Blood pressure 105/73, pulse 99, temperature 98.2 F (36.8 C), temperature source Oral, resp. rate (!) 29, height 5\' 11"  (1.803 m), weight 57.1 kg (125 lb 14.1 oz), SpO2 96 %.  Cardiac Monitoring: Box # 31 in place. Cardiac monitor yields:normal sinus rhythm.  IV Fluids:  IV in place, occlusive dsg intact without redness, IV cath hand right, condition patent and no redness normal saline.   Skin: sacrum stage 2 ulcer  Patient/Family orientated to room. Information packet given to patient/family. Admission inpatient armband information verified with patient/family to include name and date of birth and placed on patient arm. Side rails up x 2, fall assessment and education completed with patient/family. Patient/family able to verbalize understanding of risk associated with falls and verbalized understanding to call for assistance before getting out of bed. Call light within reach. Patient/family able to voice and demonstrate understanding of unit orientation instructions.    Will continue to evaluate and treat per MD orders.   Talbert Forest RN

## 2017-05-04 ENCOUNTER — Inpatient Hospital Stay (HOSPITAL_COMMUNITY): Payer: Medicaid Other

## 2017-05-04 LAB — COMPREHENSIVE METABOLIC PANEL
ALBUMIN: 1.7 g/dL — AB (ref 3.5–5.0)
ALK PHOS: 73 U/L (ref 38–126)
ALT: 6 U/L — AB (ref 17–63)
ANION GAP: 11 (ref 5–15)
AST: 13 U/L — ABNORMAL LOW (ref 15–41)
BILIRUBIN TOTAL: 0.5 mg/dL (ref 0.3–1.2)
BUN: 37 mg/dL — AB (ref 6–20)
CALCIUM: 8 mg/dL — AB (ref 8.9–10.3)
CO2: 28 mmol/L (ref 22–32)
CREATININE: 2.36 mg/dL — AB (ref 0.61–1.24)
Chloride: 98 mmol/L — ABNORMAL LOW (ref 101–111)
GFR calc non Af Amer: 30 mL/min — ABNORMAL LOW (ref >60)
GFR, EST AFRICAN AMERICAN: 34 mL/min — AB (ref >60)
Glucose, Bld: 100 mg/dL — ABNORMAL HIGH (ref 65–99)
Potassium: 2.9 mmol/L — ABNORMAL LOW (ref 3.5–5.1)
SODIUM: 137 mmol/L (ref 135–145)
Total Protein: 6.4 g/dL — ABNORMAL LOW (ref 6.5–8.1)

## 2017-05-04 LAB — CBC WITH DIFFERENTIAL/PLATELET
Basophils Absolute: 0.1 10*3/uL (ref 0.0–0.1)
Basophils Relative: 1 %
Eosinophils Absolute: 0.6 10*3/uL (ref 0.0–0.7)
Eosinophils Relative: 5 %
HEMATOCRIT: 28.8 % — AB (ref 39.0–52.0)
HEMOGLOBIN: 9.3 g/dL — AB (ref 13.0–17.0)
LYMPHS ABS: 2.3 10*3/uL (ref 0.7–4.0)
LYMPHS PCT: 20 %
MCH: 30.2 pg (ref 26.0–34.0)
MCHC: 32.3 g/dL (ref 30.0–36.0)
MCV: 93.5 fL (ref 78.0–100.0)
MONOS PCT: 6 %
Monocytes Absolute: 0.7 10*3/uL (ref 0.1–1.0)
NEUTROS ABS: 8 10*3/uL — AB (ref 1.7–7.7)
NEUTROS PCT: 68 %
Platelets: 438 10*3/uL — ABNORMAL HIGH (ref 150–400)
RBC: 3.08 MIL/uL — AB (ref 4.22–5.81)
RDW: 17.1 % — ABNORMAL HIGH (ref 11.5–15.5)
WBC: 11.7 10*3/uL — AB (ref 4.0–10.5)

## 2017-05-04 LAB — PREALBUMIN: Prealbumin: 8.7 mg/dL — ABNORMAL LOW (ref 18–38)

## 2017-05-04 MED ORDER — POTASSIUM CHLORIDE CRYS ER 20 MEQ PO TBCR
40.0000 meq | EXTENDED_RELEASE_TABLET | Freq: Three times a day (TID) | ORAL | Status: AC
  Administered 2017-05-04 – 2017-05-05 (×4): 40 meq via ORAL
  Filled 2017-05-04 (×4): qty 2

## 2017-05-04 MED ORDER — MAGNESIUM SULFATE 4 GM/100ML IV SOLN
4.0000 g | Freq: Once | INTRAVENOUS | Status: AC
  Administered 2017-05-04: 4 g via INTRAVENOUS
  Filled 2017-05-04: qty 100

## 2017-05-04 NOTE — Progress Notes (Addendum)
Subjective: Stable and alert.  Tolerating diet.  Having diarrhea.  On diet/nutritional suppliments Still has abdominal pain Does not look ill. WBC 11,700, slightly better.  Hemoglobin stable.  Potassium 2.9.  Creatinine 2.36. RLQ drain in place with tan cloudy drainage. Output not recorded.  Objective: Vital signs in last 24 hours: Temp:  [98.5 F (36.9 C)-98.9 F (37.2 C)] 98.9 F (37.2 C) (11/18 2141) Pulse Rate:  [91-106] 91 (11/19 0511) Resp:  [15-18] 16 (11/19 0511) BP: (101-119)/(70-77) 101/70 (11/19 0511) SpO2:  [94 %-100 %] 94 % (11/19 0831) Weight:  [66.5 kg (146 lb 9.7 oz)] 66.5 kg (146 lb 9.7 oz) (11/19 0500) Last BM Date: 05/04/17  Intake/Output from previous day: 11/18 0701 - 11/19 0700 In: 290 [P.O.:240; IV Piggyback:50] Out: 255 [Urine:255] Intake/Output this shift: No intake/output data recorded.  General appearance: alert.  Mental status normal.  No distress. Resp: clear to auscultation bilaterally Cardio: regular rate and rhythm, S1, S2 normal, no murmur, click, rub or gallop GI: abdomen mildly distended.  RLQ drain in place.  Looks like low volume in bag.  Mild tenderness nonlocalized. Extremities: no pain or tenderness  Lab Results:  Recent Labs    05/03/17 0319 05/04/17 0216  WBC 15.2* 11.7*  HGB 9.8* 9.3*  HCT 30.1* 28.8*  PLT 505* 438*   BMET Recent Labs    05/02/17 0238 05/04/17 0216  NA 136 137  K 3.3* 2.9*  CL 97* 98*  CO2 25 28  GLUCOSE 102* 100*  BUN 34* 37*  CREATININE 2.55* 2.36*  CALCIUM 8.2* 8.0*   PT/INR No results for input(s): LABPROT, INR in the last 72 hours. ABG No results for input(s): PHART, HCO3 in the last 72 hours.  Invalid input(s): PCO2, PO2  Studies/Results: Dg Chest Port 1 View  Result Date: 05/03/2017 CLINICAL DATA:  Hypoxemia EXAM: PORTABLE CHEST 1 VIEW COMPARISON:  05/02/2017 FINDINGS: Heart is borderline in size. Layering right pleural effusion with right lower lobe airspace opacity is similar  prior study. Diffuse interstitial prominence throughout the lungs could reflect interstitial edema. Left base atelectasis. IMPRESSION: Continued layering right effusion with right lower lobe atelectasis or pneumonia. Suspect mild interstitial edema. Left base atelectasis. Electronically Signed   By: Rolm Baptise M.D.   On: 05/03/2017 08:54    Anti-infectives: Anti-infectives (From admission, onward)   Start     Dose/Rate Route Frequency Ordered Stop   05/04/17 0130  piperacillin-tazobactam (ZOSYN) IVPB 3.375 g     3.375 g 12.5 mL/hr over 240 Minutes Intravenous Every 8 hours 05/03/17 2211     04/28/17 1530  vancomycin (VANCOCIN) IVPB 750 mg/150 ml premix  Status:  Discontinued     750 mg 150 mL/hr over 60 Minutes Intravenous Every 24 hours 04/28/17 1424 05/01/17 1128   04/27/17 1400  piperacillin-tazobactam (ZOSYN) IVPB 3.375 g  Status:  Discontinued     3.375 g 12.5 mL/hr over 240 Minutes Intravenous Every 8 hours 04/27/17 1158 05/03/17 2211   04/26/17 1830  vancomycin (VANCOCIN) 1,250 mg in sodium chloride 0.9 % 250 mL IVPB  Status:  Discontinued     1,250 mg 166.7 mL/hr over 90 Minutes Intravenous Every 48 hours 04/26/17 1739 04/28/17 1424   04/24/17 1700  vancomycin (VANCOCIN) IVPB 1000 mg/200 mL premix  Status:  Discontinued     1,000 mg 200 mL/hr over 60 Minutes Intravenous  Once 04/24/17 1652 04/24/17 1656   04/24/17 1700  vancomycin (VANCOCIN) IVPB 1000 mg/200 mL premix  Status:  Discontinued  1,000 mg 200 mL/hr over 60 Minutes Intravenous Every 48 hours 04/24/17 1656 04/26/17 1739   04/23/17 1500  piperacillin-tazobactam (ZOSYN) IVPB 3.375 g  Status:  Discontinued     3.375 g 12.5 mL/hr over 240 Minutes Intravenous Every 12 hours 04/23/17 1107 04/27/17 1158   04/18/17 1800  piperacillin-tazobactam (ZOSYN) IVPB 3.375 g  Status:  Discontinued     3.375 g 12.5 mL/hr over 240 Minutes Intravenous Every 12 hours 04/18/17 1032 04/23/17 1107   04/18/17 1400  piperacillin-tazobactam  (ZOSYN) IVPB 3.375 g  Status:  Discontinued     3.375 g 12.5 mL/hr over 240 Minutes Intravenous Every 8 hours 04/18/17 1031 04/18/17 1032   04/17/17 1200  piperacillin-tazobactam (ZOSYN) IVPB 3.375 g  Status:  Discontinued     3.375 g 100 mL/hr over 30 Minutes Intravenous Every 6 hours 04/17/17 1030 04/18/17 1031   04/17/17 1100  piperacillin-tazobactam (ZOSYN) IVPB 3.375 g  Status:  Discontinued     3.375 g 12.5 mL/hr over 240 Minutes Intravenous Every 8 hours 04/17/17 1019 04/17/17 1030   04/15/17 2125  Ampicillin-Sulbactam (UNASYN) 3 g in sodium chloride 0.9 % 100 mL IVPB  Status:  Discontinued     3 g 200 mL/hr over 30 Minutes Intravenous Every 8 hours 04/15/17 1529 04/17/17 1019   04/15/17 1100  ampicillin-sulbactam (UNASYN) 1.5 g in sodium chloride 0.9 % 50 mL IVPB  Status:  Discontinued     1.5 g 100 mL/hr over 30 Minutes Intravenous Every 12 hours 04/15/17 0937 04/15/17 1529   04/13/17 2200  levofloxacin (LEVAQUIN) IVPB 500 mg  Status:  Discontinued     500 mg 100 mL/hr over 60 Minutes Intravenous Every 48 hours 04/12/17 0114 04/12/17 0846   04/13/17 1800  piperacillin-tazobactam (ZOSYN) IVPB 2.25 g  Status:  Discontinued     2.25 g 100 mL/hr over 30 Minutes Intravenous Every 8 hours 04/13/17 1050 04/15/17 0937   04/12/17 0200  piperacillin-tazobactam (ZOSYN) IVPB 3.375 g  Status:  Discontinued     3.375 g 12.5 mL/hr over 240 Minutes Intravenous Every 8 hours 04/12/17 0103 04/13/17 1050   04/12/17 0000  levofloxacin (LEVAQUIN) IVPB 750 mg  Status:  Discontinued     750 mg 100 mL/hr over 90 Minutes Intravenous Every 48 hours 04/11/17 2230 04/12/17 0114      Assessment/Plan:  Acute diverticulitis with perforation.  Status post IR drain in [facility].  Clinically slowly improving but still with feculent output. Controlled fistula suspected. Repeat CT scan today to assess adequacy of drainage (last CT 11/5) Discussed algorithms of care.  Hopefully can resolve acute episode  with percutaneous drainage allowing consideration of elective resection and single stage.  Failing that he will need a Hartmann resection.  Acute  Respiratory failure with ARDS VDRL.  Resolving slowly Hypokalemia COPD Acute blood loss anemia Chronic anemia ETOH  abuse Tobacco abuse     LOS: 23 days    Adin Hector 05/04/2017

## 2017-05-04 NOTE — Progress Notes (Addendum)
PROGRESS NOTE    Dominic Phillips  EXB:[id number] DOB: 1961-03-02 DOA: 04/11/2017 PCP: No primary care provider on file.  Brief Narrative: 56 year old male with PMH of alcohol abuse presented to [facility] on 04/02/17 with complaints of abdominal pain without fevers, leukocytosis or diarrhea. CT abdomen revealed pericolonic inflammation/fluid collection and he was admitted for diverticular abscess. General surgery was consulted who suggested placement of percutaneous drain by IR. Cultures grew Escherichia coli. Hospital course complicated by DTs, acute respiratory failure with hypoxia/ARDS/aspiration pneumonia and circulatory shock for which he required intubation and pressors, acute kidney injury for which she required CRRT and IHD 1. Shock resolved. Renal functions improving. General surgery continue to follow. Nephrology signed off.     Assessment & Plan:   Active Problems:   Acute respiratory distress syndrome (ARDS) (HCC)   Acute kidney injury ([place])   Pressure injury of skin   Colonic diverticular abscess   COPD (chronic obstructive pulmonary disease) (HCC)   Dyspnea and respiratory abnormalities   HCAP (healthcare-associated pneumonia)   Acute blood loss anemia   Anemia of chronic disease   ETOH abuse   Tobacco abuse   Tachycardia   Tachypnea   Leukocytosis   Post-operative pain   Acute respiratory failure with hypoxia ([place])   Palliative care by specialist   Acute respiratory failure with hypoxia/ARDS/VDRF - multifactorialetiology -Pulmonary edema, pleural effusions, atelectasis, pneumonia. -weaned down from 15 L/m HFNC oxygen to 3 L/m. Wean oxygen as tolerated to keep saturations >90%, incentive spirometry, mobilize. - Required intubation from 10/27 to 11/2 - S/P thoracentesis of 1.3 L. -Remains on IV vancomycin and Zosyn. -Chest x-ray 05/02/2017 indicated worsening edema-diuresiswith strict intake output monitoring and daily weights.  Chest x-ray 1118Continued  layering right effusion with right lower lobe atelectasis or pneumonia. Suspect mild interstitial edema.  He had 1.3 L of fluid taken out from the right thoracentesis.  It was thought to be due to transudate.  Patient is still on Vanco and Zosyn for recurrent aspiration pneumonia.  Hypokalemia--replete   Diverticulitis with perforation and abscess, S/P IR drain - Gen. 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 - Continue IV antibiotics - Per surgery, plan would be to control leak and keep on antibiotics. Hopefully have definitive surgery in 6-8 weeks possibly with Dr. Lilia Pro at [facility] or Dr. Rosendo Gros here.  Consider CT scan of the abdomen if patient spikes temperature or continued increasing WBC count.RLQ percutaneous drain in place.FOR REPEAT CT TODAY.  Acute kidney injury status post CRRT 1027 and intermittent dialysis.  Renal functions improved permacath removed 1110.  Nephrology has signed off at this time.  Monitor renal functions.  Anemia of chronic disease.  Continue iron replacement.  Status post 1 unit of blood transfusion.  Aranesp.  Moderate malnutrition with chronic illness and alcohol abuse-nutrition consult noted.  Continue pravastatin Ensure.     DVT prophylaxis:sq heparin Code Status:partial Family Communication: none Disposition Plan:tbd  Consultants: General surgery, nephrology interventional radiology CCM Procedures: CRRT and IHD Antimicrobials: Vanco Zosyn  Subjective: Awake alert denies any complaints of nausea vomiting continues to have loose stools  Is resting in bed in no acute distress Objective: Vitals:   05/04/17 0500 05/04/17 0511 05/04/17 0752 05/04/17 0831  BP:  101/70    Pulse:  91    Resp:  16    Temp:      TempSrc:      SpO2:  100% 96% 94%  Weight: 66.5 kg (146 lb  9.7 oz)     Height:        Intake/Output Summary (Last 24 hours) at 05/04/2017 1242 Last data  filed at 05/04/2017 1000 Gross per 24 hour  Intake 1050 ml  Output -  Net 1050 ml   Filed Weights   05/02/17 0348 05/03/17 0258 05/04/17 0500  Weight: 59.3 kg (130 lb 11.2 oz) 57.1 kg (125 lb 14.1 oz) 66.5 kg (146 lb 9.7 oz)    Examination:  General exam: Appears calm and comfortable  Respiratory system: Clear to auscultation. Respiratory effort normal. Cardiovascular system: S1 & S2 heard, RRR. No JVD, murmurs, rubs, gallops or clicks. No pedal edema. Gastrointestinal system: Abdomen is nondistended, soft and nontender. No organomegaly or masses felt. Normal bowel sounds heard.drain right groin Central nervous system: Alert and oriented. No focal neurological deficits. Extremities: Symmetric 5 x 5 power. Skin: No rashes, lesions or ulcers Psychiatry: Judgement and insight appear normal. Mood & affect appropriate.     Data Reviewed: I have personally reviewed following labs and imaging studies  CBC: Recent Labs  Lab 04/28/17 0209 04/29/17 0224 04/30/17 0418 05/01/17 0226 05/02/17 0238 05/03/17 0319 05/04/17 0216  WBC 14.4* 12.6* 11.4* 14.7* 13.3* 15.2* 11.7*  NEUTROABS 10.5* 9.1*  --   --   --   --  8.0*  HGB 7.4* 7.5* 7.0* 8.5* 9.3* 9.8* 9.3*  HCT 22.1* 22.4* 21.2* 26.1* 28.9* 30.1* 28.8*  MCV 92.5 93.7 95.1 92.2 93.5 93.2 93.5  PLT 381 400 443* 433* 470* 505* 347*   Basic Metabolic Panel: Recent Labs  Lab 04/28/17 0209 04/29/17 0224 04/30/17 0418 05/01/17 0226 05/02/17 0238 05/03/17 1311 05/04/17 0216  NA 133* 137 136 137 136  --  137  K 3.8 4.0 4.7 4.3 3.3*  --  2.9*  CL 97* 100* 101 101 97*  --  98*  CO2 25 25 23 23 25   --  28  GLUCOSE 80 131* 99 108* 102*  --  100*  BUN 34* 34* 33* 35* 34*  --  37*  CREATININE 2.75* 2.73* 2.61* 2.58* 2.55*  --  2.36*  CALCIUM 8.1* 8.3* 8.2* 8.2* 8.2*  --  8.0*  MG 1.8 1.6* 1.9  --   --  1.6*  --   PHOS 3.9 4.8* 5.0*  --   --   --   --    GFR: Estimated Creatinine Clearance: 33.7 mL/min (A) (by C-G formula based on  SCr of 2.36 mg/dL (H)). Liver Function Tests: Recent Labs  Lab 04/28/17 0209 04/29/17 0224 04/30/17 0418 05/04/17 0216  AST 19 16  --  13*  ALT 9* 9*  --  6*  ALKPHOS 69 67  --  73  BILITOT 0.7 0.7  --  0.5  PROT 6.5 6.6  --  6.4*  ALBUMIN 1.8* 1.9* 1.8* 1.7*   No results for input(s): LIPASE, AMYLASE in the last 168 hours. No results for input(s): AMMONIA in the last 168 hours. Coagulation Profile: No results for input(s): INR, PROTIME in the last 168 hours. Cardiac Enzymes: No results for input(s): CKTOTAL, CKMB, CKMBINDEX, TROPONINI in the last 168 hours. BNP (last 3 results) No results for input(s): PROBNP in the last 8760 hours. HbA1C: No results for input(s): HGBA1C in the last 72 hours. CBG: Recent Labs  Lab 04/30/17 0751 04/30/17 1227 04/30/17 1633 04/30/17 1940 05/03/17 2141  GLUCAP 94 131* 85 163* 91   Lipid Profile: No results for input(s): CHOL, HDL, LDLCALC, TRIG, CHOLHDL, LDLDIRECT in the last  72 hours. Thyroid Function Tests: No results for input(s): TSH, T4TOTAL, FREET4, T3FREE, THYROIDAB in the last 72 hours. Anemia Panel: No results for input(s): VITAMINB12, FOLATE, FERRITIN, TIBC, IRON, RETICCTPCT in the last 72 hours. Sepsis Labs: Recent Labs  Lab 04/30/17 1209  PROCALCITON 0.50    No results found for this or any previous visit (from the past 240 hour(s)).       Radiology Studies: Dg Chest Port 1 View  Result Date: 05/03/2017 CLINICAL DATA:  Hypoxemia EXAM: PORTABLE CHEST 1 VIEW COMPARISON:  05/02/2017 FINDINGS: Heart is borderline in size. Layering right pleural effusion with right lower lobe airspace opacity is similar prior study. Diffuse interstitial prominence throughout the lungs could reflect interstitial edema. Left base atelectasis. IMPRESSION: Continued layering right effusion with right lower lobe atelectasis or pneumonia. Suspect mild interstitial edema. Left base atelectasis. Electronically Signed   By: Rolm Baptise M.D.    On: 05/03/2017 08:54        Scheduled Meds: . feeding supplement (ENSURE ENLIVE)  237 mL Oral QID  . feeding supplement (PRO-STAT SUGAR FREE 64)  30 mL Oral BID  . folic acid  1 mg Oral QHS  . guaiFENesin  1,200 mg Oral BID  . heparin subcutaneous  5,000 Units Subcutaneous Q8H  . mouth rinse  15 mL Mouth Rinse BID  . multivitamin with minerals  1 tablet Oral Daily  . pantoprazole  40 mg Oral QHS  . potassium chloride  40 mEq Oral TID  . saccharomyces boulardii  250 mg Oral BID  . sodium chloride flush  10-40 mL Intracatheter Q12H  . thiamine  100 mg Oral QHS  . traZODone  50 mg Oral QHS   Continuous Infusions: . sodium chloride 250 mL (04/29/17 2202)  . magnesium sulfate 1 - 4 g bolus IVPB 4 g (05/04/17 1203)  . piperacillin-tazobactam (ZOSYN)  IV 3.375 g (05/04/17 1003)     LOS: 23 days    Georgette Shell, MDTriad Hospitalists    If 7PM-7AM, please contact night-coverage [email or web address] Password [email or web address] 05/04/2017, 12:42 PM

## 2017-05-04 NOTE — Progress Notes (Signed)
Physical Therapy Treatment Patient Details Name: Dominic Phillips MRN: [id number] DOB: [birth year] Today's Date: 05/04/2017    History of Present Illness Pt admitted to [facility] and transferred on 04/02/17 with c/o abdominal pain.  Pt presented with ETOH withdrawls, liver abscess and SBO on TPN.  10/23 presents with hypoxia and increased WBC count, 10/25 intubated, 10/26 scans show PNA and fluid overload, patient extubated on 04/17/17.  Chart review shows COPD, VDRF, anemia, AKI and hypokalemia in addition to previous listed statements.  PMH: ETOH abuse.  11/8 R thoracentesis.    PT Comments    Pt felt very fatigued while in bed and needed rest time in between transitions from supine to sit and then sit to stand. But once he began ambulating, he felt better and was able to go 120' with RW and min A. LE's shaking last half of walk and fatigue evident but vitals remained 97% on 3L O2 and 98 BPM. PT will continue to follow.    Follow Up Recommendations  CIR     Equipment Recommendations  Rolling walker with 5" wheels;3in1 (PT)    Recommendations for Other Services       Precautions / Restrictions Precautions Precautions: Fall Precaution Comments: Drain, Flexiseal, catheter Restrictions Weight Bearing Restrictions: No    Mobility  Bed Mobility Overal bed mobility: Needs Assistance Bed Mobility: Supine to Sit     Supine to sit: Min assist     General bed mobility comments: min HHA for trunk elevation, pt able to get B LE's off bed independently  Transfers Overall transfer level: Needs assistance Equipment used: Rolling walker (2 wheeled) Transfers: Sit to/from Stand Sit to Stand: Min guard         General transfer comment: bed elevated. Increased time needed, physical assist not needed to get to RW  Ambulation/Gait Ambulation/Gait assistance: Min assist Ambulation Distance (Feet): 120 Feet Assistive device: Rolling walker (2 wheeled) Gait Pattern/deviations:  Step-through pattern;Decreased step length - right;Decreased step length - left;Narrow base of support Gait velocity: decreased Gait velocity interpretation: <1.8 ft/sec, indicative of risk for recurrent falls General Gait Details: pt shaky after 89' and mildly impulsive on way back to room, fatigue evident. Min A to steady on last half of ambulation. Pt needed support of RW especially for last 57'   Stairs            Wheelchair Mobility    Modified Rankin (Stroke Patients Only)       Balance Overall balance assessment: Needs assistance Sitting-balance support: No upper extremity supported;Feet supported Sitting balance-Leahy Scale: Fair     Standing balance support: Bilateral upper extremity supported Standing balance-Leahy Scale: Poor Standing balance comment: reliant on UE support                            Cognition Arousal/Alertness: Awake/alert Behavior During Therapy: WFL for tasks assessed/performed Overall Cognitive Status: Within Functional Limits for tasks assessed                                        Exercises      General Comments General comments (skin integrity, edema, etc.): O2 sats 97% on 3 L O2, HR 98 BPM      Pertinent Vitals/Pain Pain Assessment: No/denies pain    Home Living  Prior Function            PT Goals (current goals can now be found in the care plan section) Acute Rehab PT Goals Patient Stated Goal: wants to get back home and back to independence to care for his son PT Goal Formulation: With patient Time For Goal Achievement: 05/18/17 Potential to Achieve Goals: Good Progress towards PT goals: Progressing toward goals    Frequency    Min 3X/week      PT Plan Current plan remains appropriate    Co-evaluation              AM-PAC PT "6 Clicks" Daily Activity  Outcome Measure  Difficulty turning over in bed (including adjusting bedclothes, sheets and  blankets)?: A Little Difficulty moving from lying on back to sitting on the side of the bed? : Unable Difficulty sitting down on and standing up from a chair with arms (e.g., wheelchair, bedside commode, etc,.)?: Unable Help needed moving to and from a bed to chair (including a wheelchair)?: A Little Help needed walking in hospital room?: A Little Help needed climbing 3-5 steps with a railing? : A Lot 6 Click Score: 13    End of Session Equipment Utilized During Treatment: Oxygen;Gait belt Activity Tolerance: Patient tolerated treatment well Patient left: with call bell/phone within reach;in chair;with chair alarm set Nurse Communication: Mobility status PT Visit Diagnosis: Unsteadiness on feet (R26.81);Muscle weakness (generalized) (M62.81);Other abnormalities of gait and mobility (R26.89)     Time: 0174-9449 PT Time Calculation (min) (ACUTE ONLY): 48 min  Charges:  $Gait Training: 23-37 mins $Therapeutic Activity: 8-22 mins                    G Codes:       Leighton Roach, PT  Acute Rehab Services  [place] 05/04/2017, 1:52 PM

## 2017-05-04 NOTE — Plan of Care (Signed)
Patient is progressing, states he is waiting to see if he will have surgery.  RN will continue to monitor patient and report any issues to MD as needed. P.J. Linus Mako, RN

## 2017-05-04 NOTE — Care Management Note (Signed)
Case Management Note  Patient Details  Name: Dominic Phillips MRN: [id number] Date of Birth: 03/22/1961  Subjective/Objective:    Tx  to 5W 11/18 from [facility],  Admitted 10/27  with diverticular abscess. States prior to admit he has care for his  handicap 53 yr old son who live with him. States his friend Rockie Neighbours is currently caring for son while he's hospitalized. Mingo Amber works for [employer] in [place], [place].       Rockie Neighbours (Other) Gerda Diss (Sister)    [phone]      Action/Plan: CT  abd pending today, general surgery following. CIR vs SNF..... CM following for disposition needs.  Expected Discharge Date:                  Expected Discharge Plan:  Skilled Nursing Facility  In-House Referral:  Clinical Social Work  Discharge planning Services  CM Consult  Post Acute Care Choice:    Choice offered to:     DME Arranged:    DME Agency:     HH Arranged:    HH Agency:     Status of Service:     If discussed at [employer], dates discussed:    Additional Comments:  Sharin Mons, RN 05/04/2017, 10:49 AM

## 2017-05-05 ENCOUNTER — Inpatient Hospital Stay (HOSPITAL_COMMUNITY): Payer: Medicaid Other

## 2017-05-05 ENCOUNTER — Encounter (HOSPITAL_COMMUNITY): Payer: Self-pay | Admitting: Student

## 2017-05-05 HISTORY — PX: IR PARACENTESIS: IMG2679

## 2017-05-05 LAB — CBC WITH DIFFERENTIAL/PLATELET
BASOS PCT: 1 %
Basophils Absolute: 0.1 10*3/uL (ref 0.0–0.1)
EOS ABS: 0.7 10*3/uL (ref 0.0–0.7)
EOS PCT: 6 %
HCT: 29.6 % — ABNORMAL LOW (ref 39.0–52.0)
HEMOGLOBIN: 9.4 g/dL — AB (ref 13.0–17.0)
LYMPHS ABS: 2.4 10*3/uL (ref 0.7–4.0)
Lymphocytes Relative: 19 %
MCH: 29.9 pg (ref 26.0–34.0)
MCHC: 31.8 g/dL (ref 30.0–36.0)
MCV: 94.3 fL (ref 78.0–100.0)
MONO ABS: 1 10*3/uL (ref 0.1–1.0)
MONOS PCT: 8 %
Neutro Abs: 8.3 10*3/uL — ABNORMAL HIGH (ref 1.7–7.7)
Neutrophils Relative %: 66 %
PLATELETS: 508 10*3/uL — AB (ref 150–400)
RBC: 3.14 MIL/uL — ABNORMAL LOW (ref 4.22–5.81)
RDW: 17 % — AB (ref 11.5–15.5)
WBC: 12.5 10*3/uL — ABNORMAL HIGH (ref 4.0–10.5)

## 2017-05-05 LAB — BASIC METABOLIC PANEL
Anion gap: 10 (ref 5–15)
BUN: 35 mg/dL — AB (ref 6–20)
CALCIUM: 8.1 mg/dL — AB (ref 8.9–10.3)
CHLORIDE: 97 mmol/L — AB (ref 101–111)
CO2: 28 mmol/L (ref 22–32)
CREATININE: 2.08 mg/dL — AB (ref 0.61–1.24)
GFR calc non Af Amer: 34 mL/min — ABNORMAL LOW (ref >60)
GFR, EST AFRICAN AMERICAN: 40 mL/min — AB (ref >60)
Glucose, Bld: 99 mg/dL (ref 65–99)
Potassium: 3.8 mmol/L (ref 3.5–5.1)
SODIUM: 135 mmol/L (ref 135–145)

## 2017-05-05 LAB — GRAM STAIN

## 2017-05-05 MED ORDER — ZOLPIDEM TARTRATE 5 MG PO TABS
5.0000 mg | ORAL_TABLET | Freq: Every evening | ORAL | Status: DC | PRN
  Administered 2017-05-16 – 2017-05-19 (×4): 5 mg via ORAL
  Filled 2017-05-05 (×5): qty 1

## 2017-05-05 MED ORDER — LIDOCAINE 2% (20 MG/ML) 5 ML SYRINGE
INTRAMUSCULAR | Status: AC
  Filled 2017-05-05: qty 10

## 2017-05-05 MED ORDER — SODIUM CHLORIDE 0.9 % IV SOLN: 250.0000 mL | INTRAVENOUS | Status: DC | PRN

## 2017-05-05 MED ORDER — HEPARIN SODIUM (PORCINE) 5000 UNIT/ML IJ SOLN
5000.0000 [IU] | Freq: Three times a day (TID) | INTRAMUSCULAR | Status: AC
  Administered 2017-05-05 – 2017-05-12 (×20): 5000 [IU] via SUBCUTANEOUS
  Filled 2017-05-05 (×22): qty 1

## 2017-05-05 MED ORDER — LIDOCAINE HCL (PF) 2 % IJ SOLN
INTRAMUSCULAR | Status: DC | PRN
  Administered 2017-05-05: 10 mL

## 2017-05-05 NOTE — Progress Notes (Signed)
Inpatient Rehabilitation  Met with patient to further discuss post acute rehab options for when he is medically stable.  He is noncommittal and states that it depends on how he is doing and how much assist his friend and son's current caregiver can provide him will.  I have called Shanda to clarify with her and await a return call.  Plan to update team when I know.  Call if questions.   Carmelia Roller., CCC/SLP Admission Coordinator  [facility]  Cell [phone]

## 2017-05-05 NOTE — Procedures (Signed)
PROCEDURE SUMMARY:  Successful US guided paracentesis from left lateral abdomen.  Yielded 600 mL of clear, yellow fluid.  No immediate complications.  Pt tolerated well.   Specimen was sent for labs.  Docia Barrier PA-C 05/05/2017 2:45 PM

## 2017-05-05 NOTE — Progress Notes (Signed)
Pt refusing cpap for the night. ?

## 2017-05-05 NOTE — Progress Notes (Signed)
IR consulted for aspiration/drainage of left abdominal fluid collection.  Discussed case with Dr. Kathlene Cote who recommend attempting paracentesis first due to highly loculated fluid collection.  Will proceed as able.    Brynda Greathouse, MS RD PA-C 11:16 AM

## 2017-05-05 NOTE — Progress Notes (Addendum)
PROGRESS NOTE    Dominic Phillips  DPO:[id number] DOB: 07/27/1960 DOA: 04/11/2017 PCP: No primary care provider on file.  Brief Narrative: Assessment & Pla23 year old male with PMH of alcohol abuse presented to [facility] on 04/02/17 with complaints of abdominal pain without fevers, leukocytosis or diarrhea. CT abdomen revealed pericolonic inflammation/fluid collection and he was admitted for diverticular abscess. General surgery was consulted who suggested placement of percutaneous drain by IR. Cultures grew Escherichia coli. Hospital course complicated by DTs, acute respiratory failure with hypoxia/ARDS/aspiration pneumonia and circulatory shock for which he required intubation and pressors, acute kidney injury for which she required CRRT and IHD 1. Shock resolved. Renal functions improving. General surgery continue to follow. Nephrology signed off.      Active Problems:   Acute respiratory distress syndrome (ARDS) (HCC)   Acute kidney injury ([place])   Pressure injury of skin   Colonic diverticular abscess   COPD (chronic obstructive pulmonary disease) (HCC)   Dyspnea and respiratory abnormalities   HCAP (healthcare-associated pneumonia)   Acute blood loss anemia   Anemia of chronic disease   ETOH abuse   Tobacco abuse   Tachycardia   Tachypnea   Leukocytosis   Post-operative pain   Acute respiratory failure with hypoxia ([place])   Palliative care by specialist   Acute respiratory failure with hypoxia/ARDS/VDRF - multifactorialetiology -Pulmonary edema, pleural effusions, atelectasis, pneumonia. -weaned down from 15 L/m HFNC oxygen to 3 L/m. Wean oxygen as tolerated to keep saturations >90%, incentive spirometry, mobilize. - Required intubation from 10/27 to 11/2 - S/P thoracentesis of 1.3 L. -Remains on IV vancomycin and Zosyn. -Chest x-ray 05/02/2017 indicated worsening edema-diuresiswith strict intake output monitoring and daily weights.Chest x-ray 1118Continued  layering right effusion with right lower lobe atelectasis or pneumonia. Suspect mild interstitial edema.He had 1.3 L of fluid taken out from the right thoracentesis. It was thought to be due to transudate. Patient is still on Vanco and Zosyn for recurrent aspiration pneumonia.  Hypokalemia--replete   Diverticulitis with perforation and abscess, S/P IR drain - Gen. 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 - Continue IV antibiotics - Per surgery, plan would be to control leak and keep on antibiotics. Hopefully have definitive surgery in 6-8 weeks possibly with Dr. Lilia Pro at [facility] or Dr. Rosendo Gros here.Consider CT scan of the abdomen if patient spikes temperature or continued increasing WBC count.RLQpercutaneous drain in place REPEAT CT 11/19 SHOWS--No residual fluid collection at site of pigtail drainage catheter in RIGHT pelvis.  Moderate BILATERAL pleural effusions with compressive atelectasis of both lower lobes and question RIGHT lower lobe consolidation.  Scattered ascites with question loculated versus 3 collection in the LEFT mid abdomen, appears to be the displacing bowel loops, overall 13.3 x 9.7 x 16.6 cm in size ; this could represent sterile or infected fluid and if an infected collection is suspected consider aspiration Acute kidney injury status post CRRT 1027 and intermittent dialysis. Renal functions improved permacath removed 1110. Nephrology has signed off at this time. Monitor renal functions.  Anemia of chronic disease. Continue iron replacement. Status post 1 unit of blood transfusion. Aranesp.  Moderate malnutrition with chronic illness and alcohol abuse-nutrition consult noted. Continue pravastatin Ensure.   DVT prophylaxis.  Heparin Code Status: Partial Family Communication: None Disposition Plan: Plan is for him to go to inpatient rehab once he is medically  stable.  Consultants:  surgery Procedures: IR drain for acute diverticulitis with perforation. Antimicrobials: Zosyn Subjective: Feels well walked yesterday  with PT. still has a lot of liquid stool  Objective: Resting in bed in no acute distress. Vitals:   05/05/17 0700 05/05/17 0908 05/05/17 1343 05/05/17 1448  BP:   103/75 104/69  Pulse:      Resp:      Temp:      TempSrc:      SpO2:  98%    Weight: 59.1 kg (130 lb 4.7 oz)     Height:        Intake/Output Summary (Last 24 hours) at 05/05/2017 1449 Last data filed at 05/05/2017 1013 Gross per 24 hour  Intake 2040 ml  Output 1750 ml  Net 290 ml   Filed Weights   05/03/17 0258 05/04/17 0500 05/05/17 0700  Weight: 57.1 kg (125 lb 14.1 oz) 66.5 kg (146 lb 9.7 oz) 59.1 kg (130 lb 4.7 oz)    Examination:  General exam: Appears calm and comfortable  Respiratory system: Clear to auscultation. Respiratory effort normal. Cardiovascular system: S1 & S2 heard, RRR. No JVD, murmurs, rubs, gallops or clicks. No pedal edema. Gastrointestinal system: Abdomen is nondistended, soft and tender. No organomegaly or masses felt. Normal bowel sounds heard.drain RLQ Central nervous system: Alert and oriented. No focal neurological deficits. Extremities: Symmetric 5 x 5 power. Skin: No rashes, lesions or ulcers Psychiatry: Judgement and insight appear normal. Mood & affect appropriate.     Data Reviewed: I have personally reviewed following labs and imaging studies  CBC: Recent Labs  Lab 04/29/17 0224  05/01/17 0226 05/02/17 0238 05/03/17 0319 05/04/17 0216 05/05/17 0324  WBC 12.6*   < > 14.7* 13.3* 15.2* 11.7* 12.5*  NEUTROABS 9.1*  --   --   --   --  8.0* 8.3*  HGB 7.5*   < > 8.5* 9.3* 9.8* 9.3* 9.4*  HCT 22.4*   < > 26.1* 28.9* 30.1* 28.8* 29.6*  MCV 93.7   < > 92.2 93.5 93.2 93.5 94.3  PLT 400   < > 433* 470* 505* 438* 508*   < > = values in this interval not displayed.   Basic Metabolic Panel: Recent Labs  Lab  04/29/17 0224 04/30/17 0418 05/01/17 0226 05/02/17 0238 05/03/17 1311 05/04/17 0216 05/05/17 0324  NA 137 136 137 136  --  137 135  K 4.0 4.7 4.3 3.3*  --  2.9* 3.8  CL 100* 101 101 97*  --  98* 97*  CO2 25 23 23 25   --  28 28  GLUCOSE 131* 99 108* 102*  --  100* 99  BUN 34* 33* 35* 34*  --  37* 35*  CREATININE 2.73* 2.61* 2.58* 2.55*  --  2.36* 2.08*  CALCIUM 8.3* 8.2* 8.2* 8.2*  --  8.0* 8.1*  MG 1.6* 1.9  --   --  1.6*  --   --   PHOS 4.8* 5.0*  --   --   --   --   --    GFR: Estimated Creatinine Clearance: 33.9 mL/min (A) (by C-G formula based on SCr of 2.08 mg/dL (H)). Liver Function Tests: Recent Labs  Lab 04/29/17 0224 04/30/17 0418 05/04/17 0216  AST 16  --  13*  ALT 9*  --  6*  ALKPHOS 67  --  73  BILITOT 0.7  --  0.5  PROT 6.6  --  6.4*  ALBUMIN 1.9* 1.8* 1.7*   No results for input(s): LIPASE, AMYLASE in the last 168 hours. No results for input(s): AMMONIA in the last 168 hours. Coagulation  Profile: No results for input(s): INR, PROTIME in the last 168 hours. Cardiac Enzymes: No results for input(s): CKTOTAL, CKMB, CKMBINDEX, TROPONINI in the last 168 hours. BNP (last 3 results) No results for input(s): PROBNP in the last 8760 hours. HbA1C: No results for input(s): HGBA1C in the last 72 hours. CBG: Recent Labs  Lab 04/30/17 0751 04/30/17 1227 04/30/17 1633 04/30/17 1940 05/03/17 2141  GLUCAP 94 131* 85 163* 91   Lipid Profile: No results for input(s): CHOL, HDL, LDLCALC, TRIG, CHOLHDL, LDLDIRECT in the last 72 hours. Thyroid Function Tests: No results for input(s): TSH, T4TOTAL, FREET4, T3FREE, THYROIDAB in the last 72 hours. Anemia Panel: No results for input(s): VITAMINB12, FOLATE, FERRITIN, TIBC, IRON, RETICCTPCT in the last 72 hours. Sepsis Labs: Recent Labs  Lab 04/30/17 1209  PROCALCITON 0.50    No results found for this or any previous visit (from the past 240 hour(s)).       Radiology Studies: Ct Abdomen Pelvis Wo  Contrast  Result Date: 05/04/2017 CLINICAL DATA:  Diverticulitis post perforation and abscess, post drainage procedure, follow-up, question and colocutaneous fistula, mild leukocytosis EXAM: CT ABDOMEN AND PELVIS WITHOUT CONTRAST TECHNIQUE: Multidetector CT imaging of the abdomen and pelvis was performed following the standard protocol without IV contrast. Sagittal and coronal MPR images reconstructed from axial data set. Patient drank dilute oral contrast for exam. COMPARISON:  04/19/2017 FINDINGS: Lower chest: Moderate BILATERAL pleural effusions. Compressive atelectasis of the lower lobes with question consolidation in RIGHT lower lobe. Hepatobiliary: Gallbladder and liver normal appearance Pancreas: Normal appearance Spleen: Normal appearance Adrenals/Urinary Tract: Adrenal glands, kidneys, and ureters normal appearance. Bladder is incompletely distended. Bladder wall appears mildly thickened, question due to true wall thickening such as from prior pelvic inflammation or an artifact from underdistention Stomach/Bowel: Rectal tube with question mild rectal wall thickening. Sigmoid diverticulosis changes. Remainder of colon and small bowel loops unremarkable. Stomach decompressed. Vascular/Lymphatic: Atherosclerotic calcifications aorta and iliac arteries without aneurysm. No definite adenopathy. Reproductive: Minimal prostatic enlargement. Seminal vesicles unremarkable. Other: Pigtail drainage catheter at site of prior abscess without residual associated fluid collection. Scattered free fluid with a focal area of prominent fluid in the lateral LEFT mid abdomen, question free fluid versus loculated, appears to be displacing bowel loops, collection of fluid at this site overall measuring approximately 13.3 x 9.7 x 16.6 cm. No definite free intraperitoneal air. No hernia. Musculoskeletal: Remarkable IMPRESSION: No residual fluid collection at site of pigtail drainage catheter in RIGHT pelvis. Moderate BILATERAL  pleural effusions with compressive atelectasis of both lower lobes and question RIGHT lower lobe consolidation. Scattered ascites with question loculated versus 3 collection in the LEFT mid abdomen, appears to be the displacing bowel loops, overall 13.3 x 9.7 x 16.6 cm in size ; this could represent sterile or infected fluid and if an infected collection is suspected consider aspiration. Electronically Signed   By: Lavonia Dana M.D.   On: 05/04/2017 20:04        Scheduled Meds: . lidocaine      . feeding supplement (ENSURE ENLIVE)  237 mL Oral QID  . feeding supplement (PRO-STAT SUGAR FREE 64)  30 mL Oral BID  . folic acid  1 mg Oral QHS  . guaiFENesin  1,200 mg Oral BID  . heparin subcutaneous  5,000 Units Subcutaneous Q8H  . mouth rinse  15 mL Mouth Rinse BID  . multivitamin with minerals  1 tablet Oral Daily  . pantoprazole  40 mg Oral QHS  . saccharomyces boulardii  250  mg Oral BID  . thiamine  100 mg Oral QHS  . traZODone  50 mg Oral QHS   Continuous Infusions: . sodium chloride    . piperacillin-tazobactam (ZOSYN)  IV Stopped (05/05/17 1413)     LOS: 24 days    Georgette Shell, MD [facility] Pager [phone]  If 7PM-7AM, please contact night-coverage [email or web address] Password [email or web address] 05/05/2017, 2:49 PM

## 2017-05-05 NOTE — Progress Notes (Signed)
PT Cancellation Note  Patient Details Name: Dominic Phillips MRN: [id number] DOB: 1961/05/19   Cancelled Treatment:    Reason Eval/Treat Not Completed: Fatigue/lethargy limiting ability to participate;Other (comment)  Attempted to see patient x 2 today.  Patient being cleaned, and patient declining due to fatigue.  Will return tomorrow for PT session.   Despina Pole 05/05/2017, 7:05 PM Carita Pian. Sanjuana Kava, [place] Pager [phone]

## 2017-05-05 NOTE — Progress Notes (Signed)
[facility] Progress Note     Subjective: CC: abdominal pain  Patient still with pain in lower abdomen, unchanged. Patient disappointed to hear that there is another fluid collection in abdomen and that he may need another drain.  UOP good. VSS.   Objective: Vital signs in last 24 hours: Temp:  [98.3 F (36.8 C)-98.4 F (36.9 C)] 98.3 F (36.8 C) (11/20 0637) Pulse Rate:  [87-97] 89 (11/20 0637) Resp:  [14-20] 18 (11/20 0637) BP: (103-109)/(70-73) 109/70 (11/20 0637) SpO2:  [95 %-100 %] 97 % (11/20 0637) Weight:  [59.1 kg (130 lb 4.7 oz)] 59.1 kg (130 lb 4.7 oz) (11/20 0700) Last BM Date: 05/04/17  Intake/Output from previous day: 11/19 0701 - 11/20 0700 In: 3470 [P.O.:3200; I.V.:120; IV Piggyback:150] Out: 1750 [Urine:1725; Drains:25] Intake/Output this shift: No intake/output data recorded.  PE: Gen:  Alert, NAD, cooperative Card:  Regular rate and rhythm Pulm:  Normal effort, clear to auscultation bilaterally Abd: Soft, TTP in RLQ, non-distended, bowel sounds hypoactive, no HSM, drain in RLQ with yellowish feculent material, rectal pouch in place with liquid brown stool present Skin: warm and dry, no rashes  Psych: A&Ox3   Lab Results:  Recent Labs    05/04/17 0216 05/05/17 0324  WBC 11.7* 12.5*  HGB 9.3* 9.4*  HCT 28.8* 29.6*  PLT 438* 508*   BMET Recent Labs    05/04/17 0216 05/05/17 0324  NA 137 135  K 2.9* 3.8  CL 98* 97*  CO2 28 28  GLUCOSE 100* 99  BUN 37* 35*  CREATININE 2.36* 2.08*  CALCIUM 8.0* 8.1*   PT/INR No results for input(s): LABPROT, INR in the last 72 hours. CMP     Component Value Date/Time   NA 135 05/05/2017 0324   K 3.8 05/05/2017 0324   CL 97 (L) 05/05/2017 0324   CO2 28 05/05/2017 0324   GLUCOSE 99 05/05/2017 0324   BUN 35 (H) 05/05/2017 0324   CREATININE 2.08 (H) 05/05/2017 0324   CALCIUM 8.1 (L) 05/05/2017 0324   PROT 6.4 (L) 05/04/2017 0216   ALBUMIN 1.7 (L) 05/04/2017 0216   AST 13 (L) 05/04/2017  0216   ALT 6 (L) 05/04/2017 0216   ALKPHOS 73 05/04/2017 0216   BILITOT 0.5 05/04/2017 0216   GFRNONAA 34 (L) 05/05/2017 0324   GFRAA 40 (L) 05/05/2017 0324   Lipase  No results found for: LIPASE     Studies/Results: Ct Abdomen Pelvis Wo Contrast  Result Date: 05/04/2017 CLINICAL DATA:  Diverticulitis post perforation and abscess, post drainage procedure, follow-up, question and colocutaneous fistula, mild leukocytosis EXAM: CT ABDOMEN AND PELVIS WITHOUT CONTRAST TECHNIQUE: Multidetector CT imaging of the abdomen and pelvis was performed following the standard protocol without IV contrast. Sagittal and coronal MPR images reconstructed from axial data set. Patient drank dilute oral contrast for exam. COMPARISON:  04/19/2017 FINDINGS: Lower chest: Moderate BILATERAL pleural effusions. Compressive atelectasis of the lower lobes with question consolidation in RIGHT lower lobe. Hepatobiliary: Gallbladder and liver normal appearance Pancreas: Normal appearance Spleen: Normal appearance Adrenals/Urinary Tract: Adrenal glands, kidneys, and ureters normal appearance. Bladder is incompletely distended. Bladder wall appears mildly thickened, question due to true wall thickening such as from prior pelvic inflammation or an artifact from underdistention Stomach/Bowel: Rectal tube with question mild rectal wall thickening. Sigmoid diverticulosis changes. Remainder of colon and small bowel loops unremarkable. Stomach decompressed. Vascular/Lymphatic: Atherosclerotic calcifications aorta and iliac arteries without aneurysm. No definite adenopathy. Reproductive: Minimal prostatic enlargement. Seminal vesicles unremarkable. Other: Pigtail drainage  catheter at site of prior abscess without residual associated fluid collection. Scattered free fluid with a focal area of prominent fluid in the lateral LEFT mid abdomen, question free fluid versus loculated, appears to be displacing bowel loops, collection of fluid at this  site overall measuring approximately 13.3 x 9.7 x 16.6 cm. No definite free intraperitoneal air. No hernia. Musculoskeletal: Remarkable IMPRESSION: No residual fluid collection at site of pigtail drainage catheter in RIGHT pelvis. Moderate BILATERAL pleural effusions with compressive atelectasis of both lower lobes and question RIGHT lower lobe consolidation. Scattered ascites with question loculated versus 3 collection in the LEFT mid abdomen, appears to be the displacing bowel loops, overall 13.3 x 9.7 x 16.6 cm in size ; this could represent sterile or infected fluid and if an infected collection is suspected consider aspiration. Electronically Signed   By: Lavonia Dana M.D.   On: 05/04/2017 20:04    Anti-infectives: Anti-infectives (From admission, onward)   Start     Dose/Rate Route Frequency Ordered Stop   05/04/17 0130  piperacillin-tazobactam (ZOSYN) IVPB 3.375 g     3.375 g 12.5 mL/hr over 240 Minutes Intravenous Every 8 hours 05/03/17 2211     04/28/17 1530  vancomycin (VANCOCIN) IVPB 750 mg/150 ml premix  Status:  Discontinued     750 mg 150 mL/hr over 60 Minutes Intravenous Every 24 hours 04/28/17 1424 05/01/17 1128   04/27/17 1400  piperacillin-tazobactam (ZOSYN) IVPB 3.375 g  Status:  Discontinued     3.375 g 12.5 mL/hr over 240 Minutes Intravenous Every 8 hours 04/27/17 1158 05/03/17 2211   04/26/17 1830  vancomycin (VANCOCIN) 1,250 mg in sodium chloride 0.9 % 250 mL IVPB  Status:  Discontinued     1,250 mg 166.7 mL/hr over 90 Minutes Intravenous Every 48 hours 04/26/17 1739 04/28/17 1424   04/24/17 1700  vancomycin (VANCOCIN) IVPB 1000 mg/200 mL premix  Status:  Discontinued     1,000 mg 200 mL/hr over 60 Minutes Intravenous  Once 04/24/17 1652 04/24/17 1656   04/24/17 1700  vancomycin (VANCOCIN) IVPB 1000 mg/200 mL premix  Status:  Discontinued     1,000 mg 200 mL/hr over 60 Minutes Intravenous Every 48 hours 04/24/17 1656 04/26/17 1739   04/23/17 1500  piperacillin-tazobactam  (ZOSYN) IVPB 3.375 g  Status:  Discontinued     3.375 g 12.5 mL/hr over 240 Minutes Intravenous Every 12 hours 04/23/17 1107 04/27/17 1158   04/18/17 1800  piperacillin-tazobactam (ZOSYN) IVPB 3.375 g  Status:  Discontinued     3.375 g 12.5 mL/hr over 240 Minutes Intravenous Every 12 hours 04/18/17 1032 04/23/17 1107   04/18/17 1400  piperacillin-tazobactam (ZOSYN) IVPB 3.375 g  Status:  Discontinued     3.375 g 12.5 mL/hr over 240 Minutes Intravenous Every 8 hours 04/18/17 1031 04/18/17 1032   04/17/17 1200  piperacillin-tazobactam (ZOSYN) IVPB 3.375 g  Status:  Discontinued     3.375 g 100 mL/hr over 30 Minutes Intravenous Every 6 hours 04/17/17 1030 04/18/17 1031   04/17/17 1100  piperacillin-tazobactam (ZOSYN) IVPB 3.375 g  Status:  Discontinued     3.375 g 12.5 mL/hr over 240 Minutes Intravenous Every 8 hours 04/17/17 1019 04/17/17 1030   04/15/17 2125  Ampicillin-Sulbactam (UNASYN) 3 g in sodium chloride 0.9 % 100 mL IVPB  Status:  Discontinued     3 g 200 mL/hr over 30 Minutes Intravenous Every 8 hours 04/15/17 1529 04/17/17 1019   04/15/17 1100  ampicillin-sulbactam (UNASYN) 1.5 g in sodium chloride 0.9 %  50 mL IVPB  Status:  Discontinued     1.5 g 100 mL/hr over 30 Minutes Intravenous Every 12 hours 04/15/17 0937 04/15/17 1529   04/13/17 2200  levofloxacin (LEVAQUIN) IVPB 500 mg  Status:  Discontinued     500 mg 100 mL/hr over 60 Minutes Intravenous Every 48 hours 04/12/17 0114 04/12/17 0846   04/13/17 1800  piperacillin-tazobactam (ZOSYN) IVPB 2.25 g  Status:  Discontinued     2.25 g 100 mL/hr over 30 Minutes Intravenous Every 8 hours 04/13/17 1050 04/15/17 0937   04/12/17 0200  piperacillin-tazobactam (ZOSYN) IVPB 3.375 g  Status:  Discontinued     3.375 g 12.5 mL/hr over 240 Minutes Intravenous Every 8 hours 04/12/17 0103 04/13/17 1050   04/12/17 0000  levofloxacin (LEVAQUIN) IVPB 750 mg  Status:  Discontinued     750 mg 100 mL/hr over 90 Minutes Intravenous Every 48 hours  04/11/17 2230 04/12/17 0114       Assessment/Plan Acute diverticulitis with perforation.  Status post IR drain in [facility].  Clinically slowly improving but still with feculent output. Controlled fistula suspected. - Repeat CT scan yesterday: scattered ascites and large LEFT abdominal fluid collection 13 x 10 x 16 cm - have consulted IR for drainage - Discussed algorithms of care.  Hopefully can resolve acute episode with percutaneous drainage allowing consideration of elective resection and single stage.  Failing that he will need a Hartmann resection.  Acute  Respiratory failure with ARDS VDRL.  Resolving slowly Hypokalemia COPD Acute blood loss anemia Chronic anemia ETOH  abuse Tobacco abuse  FEN: NPO, IVF VTE: hold AM heparin for now ID: vanc stopped 11/16, IV zosyn (11/2>>)  Plan: IR to see, hopefully can drain fluid collection in left abdomen. If patient does not improve he may ultimately need Hartmann resection.    LOS: 24 days    Brigid Re , [facility] Surgery 05/05/2017, 8:40 AM Pager: [phone] Consults: [phone] Mon-Fri 7:00 am-4:30 pm Sat-Sun 7:00 am-11:30 am

## 2017-05-05 NOTE — Plan of Care (Signed)
Patient is progressing.  He walked in the hall earlier today with PT and states he tolerated activity fairly well.  Patient's skin is improving, stage 2 pressure ulcer on sacrum appears healed, area cleansed and new foam dressing applied. Patient remains free from injury and O2 has been able to be reduced to 2 lpm/ [place] with patient tolerating well.  RN will continue to monitor patient and notify MD of any changes.  P.J. Linus Mako, RN

## 2017-05-06 LAB — BASIC METABOLIC PANEL
Anion gap: 10 (ref 5–15)
BUN: 30 mg/dL — AB (ref 6–20)
CO2: 26 mmol/L (ref 22–32)
CREATININE: 1.87 mg/dL — AB (ref 0.61–1.24)
Calcium: 8.2 mg/dL — ABNORMAL LOW (ref 8.9–10.3)
Chloride: 101 mmol/L (ref 101–111)
GFR calc Af Amer: 45 mL/min — ABNORMAL LOW (ref >60)
GFR, EST NON AFRICAN AMERICAN: 39 mL/min — AB (ref >60)
GLUCOSE: 94 mg/dL (ref 65–99)
POTASSIUM: 3.6 mmol/L (ref 3.5–5.1)
SODIUM: 137 mmol/L (ref 135–145)

## 2017-05-06 LAB — URINALYSIS, ROUTINE W REFLEX MICROSCOPIC
Bilirubin Urine: NEGATIVE
GLUCOSE, UA: NEGATIVE mg/dL
KETONES UR: NEGATIVE mg/dL
Nitrite: NEGATIVE
PH: 6 (ref 5.0–8.0)
PROTEIN: NEGATIVE mg/dL
Specific Gravity, Urine: 1.01 (ref 1.005–1.030)

## 2017-05-06 LAB — CBC
HCT: 28.6 % — ABNORMAL LOW (ref 39.0–52.0)
Hemoglobin: 9.2 g/dL — ABNORMAL LOW (ref 13.0–17.0)
MCH: 30.7 pg (ref 26.0–34.0)
MCHC: 32.2 g/dL (ref 30.0–36.0)
MCV: 95.3 fL (ref 78.0–100.0)
PLATELETS: 491 10*3/uL — AB (ref 150–400)
RBC: 3 MIL/uL — AB (ref 4.22–5.81)
RDW: 17.1 % — ABNORMAL HIGH (ref 11.5–15.5)
WBC: 10.4 10*3/uL (ref 4.0–10.5)

## 2017-05-06 LAB — C DIFFICILE QUICK SCREEN W PCR REFLEX
C DIFFICILE (CDIFF) INTERP: NOT DETECTED
C DIFFICILE (CDIFF) TOXIN: NEGATIVE
C Diff antigen: NEGATIVE

## 2017-05-06 NOTE — Progress Notes (Signed)
CC:  Abdominal pain/aspiration pneumonia  Subjective: Still complaining of pain lower abdomen, abdominal discomfort somewhat better after paracentesis.  He says he has stool with voiding, but urine is currently clear.  He has a condom cath and a Flexiseal in place.  drainage from the IR drain looks feculent and the drainage around the catheter is the same.    Objective: Vital signs in last 24 hours: Temp:  [97.8 F (36.6 C)-98.6 F (37 C)] 98.3 F (36.8 C) (11/21 0536) Pulse Rate:  [84-93] 91 (11/21 0536) Resp:  [18-20] 20 (11/21 0536) BP: (103-113)/(67-75) 111/68 (11/21 0536) SpO2:  [97 %-99 %] 98 % (11/21 0536) FiO2 (%):  [40 %] 40 % (11/20 1450) Weight:  [59.4 kg (130 lb 15.3 oz)] 59.4 kg (130 lb 15.3 oz) (11/21 0500) Last BM Date: 05/05/17 NPO 200 IV  1350 urine Drain 30 Stool 100 recorded Afebrile, VSS WBC 12.5 - H/H stable Creatinine 2.08 I do not see a C diff   Intake/Output from previous day: 11/20 0701 - 11/21 0700 In: 200 [IV Piggyback:200] Out: 1480 [Urine:1350; Drains:30; Stool:100] Intake/Output this shift: No intake/output data recorded.  General appearance: alert, cooperative and no distress GI: drain is feculent, mild distension, ongoing discomfort lower abdomen.  He has a condom cath, and flexiseal in place.    Lab Results:  Recent Labs    05/04/17 0216 05/05/17 0324  WBC 11.7* 12.5*  HGB 9.3* 9.4*  HCT 28.8* 29.6*  PLT 438* 508*    BMET Recent Labs    05/04/17 0216 05/05/17 0324  NA 137 135  K 2.9* 3.8  CL 98* 97*  CO2 28 28  GLUCOSE 100* 99  BUN 37* 35*  CREATININE 2.36* 2.08*  CALCIUM 8.0* 8.1*   PT/INR No results for input(s): LABPROT, INR in the last 72 hours.  Recent Labs  Lab 04/30/17 0418 05/04/17 0216  AST  --  13*  ALT  --  6*  ALKPHOS  --  73  BILITOT  --  0.5  PROT  --  6.4*  ALBUMIN 1.8* 1.7*     Lipase  No results found for: LIPASE   Medications: . feeding supplement (ENSURE ENLIVE)  237 mL Oral  QID  . feeding supplement (PRO-STAT SUGAR FREE 64)  30 mL Oral BID  . folic acid  1 mg Oral QHS  . guaiFENesin  1,200 mg Oral BID  . heparin subcutaneous  5,000 Units Subcutaneous Q8H  . mouth rinse  15 mL Mouth Rinse BID  . multivitamin with minerals  1 tablet Oral Daily  . pantoprazole  40 mg Oral QHS  . saccharomyces boulardii  250 mg Oral BID  . thiamine  100 mg Oral QHS  . traZODone  50 mg Oral QHS   Anti-infectives (From admission, onward)   Start     Dose/Rate Route Frequency Ordered Stop   05/04/17 0130  piperacillin-tazobactam (ZOSYN) IVPB 3.375 g     3.375 g 12.5 mL/hr over 240 Minutes Intravenous Every 8 hours 05/03/17 2211     04/28/17 1530  vancomycin (VANCOCIN) IVPB 750 mg/150 ml premix  Status:  Discontinued     750 mg 150 mL/hr over 60 Minutes Intravenous Every 24 hours 04/28/17 1424 05/01/17 1128   04/27/17 1400  piperacillin-tazobactam (ZOSYN) IVPB 3.375 g  Status:  Discontinued     3.375 g 12.5 mL/hr over 240 Minutes Intravenous Every 8 hours 04/27/17 1158 05/03/17 2211   04/26/17 1830  vancomycin (VANCOCIN) 1,250 mg in  sodium chloride 0.9 % 250 mL IVPB  Status:  Discontinued     1,250 mg 166.7 mL/hr over 90 Minutes Intravenous Every 48 hours 04/26/17 1739 04/28/17 1424   04/24/17 1700  vancomycin (VANCOCIN) IVPB 1000 mg/200 mL premix  Status:  Discontinued     1,000 mg 200 mL/hr over 60 Minutes Intravenous  Once 04/24/17 1652 04/24/17 1656   04/24/17 1700  vancomycin (VANCOCIN) IVPB 1000 mg/200 mL premix  Status:  Discontinued     1,000 mg 200 mL/hr over 60 Minutes Intravenous Every 48 hours 04/24/17 1656 04/26/17 1739   04/23/17 1500  piperacillin-tazobactam (ZOSYN) IVPB 3.375 g  Status:  Discontinued     3.375 g 12.5 mL/hr over 240 Minutes Intravenous Every 12 hours 04/23/17 1107 04/27/17 1158   04/18/17 1800  piperacillin-tazobactam (ZOSYN) IVPB 3.375 g  Status:  Discontinued     3.375 g 12.5 mL/hr over 240 Minutes Intravenous Every 12 hours 04/18/17 1032  04/23/17 1107   04/18/17 1400  piperacillin-tazobactam (ZOSYN) IVPB 3.375 g  Status:  Discontinued     3.375 g 12.5 mL/hr over 240 Minutes Intravenous Every 8 hours 04/18/17 1031 04/18/17 1032   04/17/17 1200  piperacillin-tazobactam (ZOSYN) IVPB 3.375 g  Status:  Discontinued     3.375 g 100 mL/hr over 30 Minutes Intravenous Every 6 hours 04/17/17 1030 04/18/17 1031   04/17/17 1100  piperacillin-tazobactam (ZOSYN) IVPB 3.375 g  Status:  Discontinued     3.375 g 12.5 mL/hr over 240 Minutes Intravenous Every 8 hours 04/17/17 1019 04/17/17 1030   04/15/17 2125  Ampicillin-Sulbactam (UNASYN) 3 g in sodium chloride 0.9 % 100 mL IVPB  Status:  Discontinued     3 g 200 mL/hr over 30 Minutes Intravenous Every 8 hours 04/15/17 1529 04/17/17 1019   04/15/17 1100  ampicillin-sulbactam (UNASYN) 1.5 g in sodium chloride 0.9 % 50 mL IVPB  Status:  Discontinued     1.5 g 100 mL/hr over 30 Minutes Intravenous Every 12 hours 04/15/17 0937 04/15/17 1529   04/13/17 2200  levofloxacin (LEVAQUIN) IVPB 500 mg  Status:  Discontinued     500 mg 100 mL/hr over 60 Minutes Intravenous Every 48 hours 04/12/17 0114 04/12/17 0846   04/13/17 1800  piperacillin-tazobactam (ZOSYN) IVPB 2.25 g  Status:  Discontinued     2.25 g 100 mL/hr over 30 Minutes Intravenous Every 8 hours 04/13/17 1050 04/15/17 0937   04/12/17 0200  piperacillin-tazobactam (ZOSYN) IVPB 3.375 g  Status:  Discontinued     3.375 g 12.5 mL/hr over 240 Minutes Intravenous Every 8 hours 04/12/17 0103 04/13/17 1050   04/12/17 0000  levofloxacin (LEVAQUIN) IVPB 750 mg  Status:  Discontinued     750 mg 100 mL/hr over 90 Minutes Intravenous Every 48 hours 04/11/17 2230 04/12/17 0114     . sodium chloride    . piperacillin-tazobactam (ZOSYN)  IV 3.375 g (05/06/17 0245)    Assessment/Plan Acute diverticulitis with perforation. [name] 04/02/17 diverticulitis with drain placement - feculent drainage CT 05/04/17:scattered ascites and large LEFT  abdominal fluid collection 13 x 10 x 16 cm - have consulted IR for drainage 600 ml paracentesis yellow fluid 05/05/17 - gram stain - no organisms so far  [facility] 04/11/17 - ARDS/pneumonia/COPD Bilateral pleura effusions DT"s - Heavy ETOH use preadmit Hx of tobacco use Anemia Chronic kidney disease - creatinine 2.08 FEN: carb mod/hearthealthy ID: vanc stopped 11/16, IV zosyn (11/2=>> day 19) DVT:  Heparin Foley:  - condom cath and flexiseal   Plan:  Not sure why he has a condom cath and flexiseal.  I see no evidence of a C diff.  Diet restarted.  I would get him up and dc flexiseal and condom catheter.  He reports stool when he voids.  Will check urine culture but urine looks clear in the bag currently. I'm going to check C diff.  On probiotics.   UA cloudy, with TNTC WBC per HPF - nitrate negative - culture ordered  LOS: 25 days    Coe Angelos 05/06/2017 [phone]

## 2017-05-06 NOTE — Progress Notes (Signed)
PROGRESS NOTE    Dominic Phillips  BZJ:[id number] DOB: May 14, 1961 DOA: 04/11/2017 PCP: No primary care provider on file.  Brief Narrative: Assessment & Pla56 year old male with PMH of alcohol abuse presented to [facility] on 04/02/17 with complaints of abdominal pain without fevers, leukocytosis or diarrhea. CT abdomen revealed pericolonic inflammation/fluid collection and he was admitted for diverticular abscess. General surgery was consulted who suggested placement of percutaneous drain by IR. Cultures grew Escherichia coli. Hospital course complicated by DTs, acute respiratory failure with hypoxia/ARDS/aspiration pneumonia and circulatory shock for which he required intubation and pressors, acute kidney injury for which she required CRRT and IHD 1. Shock resolved. Renal functions improving. General surgery continue to follow. Nephrology signed off.  Patient seen and examined at his bedside. Continues to have flexiceal and percutaneous drainage. Admits to intermittent pain dull  5/10, at its worse 7/10 improved with dilaudid. Admits to intermittent nausea with no vomiting.    Active Problems:   Acute respiratory distress syndrome (ARDS) (HCC)   Acute kidney injury ([place])   Pressure injury of skin   Colonic diverticular abscess   COPD (chronic obstructive pulmonary disease) (HCC)   Dyspnea and respiratory abnormalities   HCAP (healthcare-associated pneumonia)   Acute blood loss anemia   Anemia of chronic disease   ETOH abuse   Tobacco abuse   Tachycardia   Tachypnea   Leukocytosis   Post-operative pain   Acute respiratory failure with hypoxia (HCC)   Palliative care by specialist   Acute respiratory failure with hypoxia/ARDS/VDRF - multifactorial2/2 Pulmonary edema vs pleural effusions vs atelectasis vs pneumonia. -weaned down from 15 L/m HFNC oxygen to 3 L/m. Wean oxygen as tolerated to keep saturations >90%, incentive spirometry, mobilize. - Required intubation from 10/27  to 11/2 - S/P thoracentesis of 1.3 L. -Remains on IV vancomycin and Zosyn. -Chest x-ray 05/02/2017 indicated worsening edema-diuresiswith strict intake output monitoring and daily weights.Chest x-ray 1118Continued layering right effusion with right lower lobe atelectasis or pneumonia. Suspect mild interstitial edema.He had 1.3 L of fluid taken out from the right thoracentesis. It was thought to be due to transudate. Patient is still on Vanco and Zosyn for recurrent aspiration pneumonia.  Hypokalemia-- -replete as indicated -BMP am  Diverticulitis with perforation and abscess, S/P IR drain - Gen. 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 - Continue IV antibiotics - Per surgery, plan would be to control leak and keep on antibiotics. Hopefully have definitive surgery in 6-8 weeks possibly with Dr. Lilia Pro at [facility] or Dr. Rosendo Gros here.Consider CT scan of the abdomen if patient spikes temperature or continued increasing WBC count.RLQpercutaneous drain in place REPEAT CT 05/04/17-No residual fluid collection at site of pigtail drainage catheter in right pelvis.  Moderate BILATERAL pleural effusions  -with compressive atelectasis of -both lower lobes and question right lower lobe consolidation.  Scattered ascites  -with question loculated versus 3 collection in the LEFT mid abdomen, appears to be the displacing bowel loops, overall 13.3 x 9.7 x 16.6 cm in size ; this could represent sterile or infected fluid and if an infected collection is suspected consider Aspiration  Acute kidney injury status post CRRT 1027 and intermittent dialysis. -Renal functions improved permacath removed 1110.  -Nephrology has signed off at this time. Monitor renal functions.  Anemia of chronic disease.  -Continue iron replacement.  -Status post 1 unit of blood transfusion.  -Aranesp.  Moderate malnutrition  with chronic illness and alcohol abuse- -nutrition consult noted.  -Continue  Ensure.   DVT prophylaxis.  Heparin Code Status: Partial Family Communication: None Disposition Plan: Plan is for him to go to inpatient rehab once he is medically stable.  Consultants:  General surgery Procedures: IR drain for acute diverticulitis with perforation. Antimicrobials: Zosyn  Vitals:   05/05/17 2138 05/06/17 0453 05/06/17 0500 05/06/17 0536  BP: 113/69 111/68  111/68  Pulse: 93 91  91  Resp: 20 20  20   Temp: 98.6 F (37 C) 98.3 F (36.8 C)  98.3 F (36.8 C)  TempSrc: Oral Oral  Oral  SpO2: 97% 98%  98%  Weight:   59.4 kg (130 lb 15.3 oz)   Height:        Intake/Output Summary (Last 24 hours) at 05/06/2017 0822 Last data filed at 05/06/2017 0646 Gross per 24 hour  Intake 200 ml  Output 1480 ml  Net -1280 ml   Filed Weights   05/04/17 0500 05/05/17 0700 05/06/17 0500  Weight: 66.5 kg (146 lb 9.7 oz) 59.1 kg (130 lb 4.7 oz) 59.4 kg (130 lb 15.3 oz)    Examination:  General exam: 56 yo CM emaciated NAD. A&O x3 Respiratory system: Clear to auscultation. Respiratory effort normal. Cardiovascular system: S1 & S2 heard, RRR. No JVD, murmurs, rubs, gallops or clicks. No pedal edema. Gastrointestinal system: Abdomen is nondistended, soft and tender. No organomegaly or masses felt. Normal bowel sounds heard.drain RLQ Central nervous system: Alert and oriented. No focal neurological deficits. Extremities: Symmetric 5 x 5 power. Skin: No rashes, lesions or ulcers.Flexiceal in place. Psychiatry: Judgement and insight appear normal. Mood & affect appropriate.     Data Reviewed: I have personally reviewed following labs and imaging studies  CBC: Recent Labs  Lab 05/01/17 0226 05/02/17 0238 05/03/17 0319 05/04/17 0216 05/05/17 0324  WBC 14.7* 13.3* 15.2* 11.7* 12.5*  NEUTROABS  --   --   --  8.0* 8.3*  HGB 8.5* 9.3* 9.8* 9.3* 9.4*  HCT 26.1* 28.9* 30.1* 28.8* 29.6*  MCV 92.2  93.5 93.2 93.5 94.3  PLT 433* 470* 505* 438* 149*   Basic Metabolic Panel: Recent Labs  Lab 04/30/17 0418 05/01/17 0226 05/02/17 0238 05/03/17 1311 05/04/17 0216 05/05/17 0324  NA 136 137 136  --  137 135  K 4.7 4.3 3.3*  --  2.9* 3.8  CL 101 101 97*  --  98* 97*  CO2 23 23 25   --  28 28  GLUCOSE 99 108* 102*  --  100* 99  BUN 33* 35* 34*  --  37* 35*  CREATININE 2.61* 2.58* 2.55*  --  2.36* 2.08*  CALCIUM 8.2* 8.2* 8.2*  --  8.0* 8.1*  MG 1.9  --   --  1.6*  --   --   PHOS 5.0*  --   --   --   --   --    GFR: Estimated Creatinine Clearance: 34.1 mL/min (A) (by C-G formula based on SCr of 2.08 mg/dL (H)). Liver Function Tests: Recent Labs  Lab 04/30/17 0418 05/04/17 0216  AST  --  13*  ALT  --  6*  ALKPHOS  --  73  BILITOT  --  0.5  PROT  --  6.4*  ALBUMIN 1.8* 1.7*   No results for input(s): LIPASE, AMYLASE in the last 168 hours. No results for input(s): AMMONIA in the last 168 hours. Coagulation Profile: No results for input(s): INR, PROTIME in the last 168 hours. Cardiac Enzymes: No results for input(s): CKTOTAL, CKMB, CKMBINDEX, TROPONINI in  the last 168 hours. BNP (last 3 results) No results for input(s): PROBNP in the last 8760 hours. HbA1C: No results for input(s): HGBA1C in the last 72 hours. CBG: Recent Labs  Lab 04/30/17 0751 04/30/17 1227 04/30/17 1633 04/30/17 1940 05/03/17 2141  GLUCAP 94 131* 85 163* 91   Lipid Profile: No results for input(s): CHOL, HDL, LDLCALC, TRIG, CHOLHDL, LDLDIRECT in the last 72 hours. Thyroid Function Tests: No results for input(s): TSH, T4TOTAL, FREET4, T3FREE, THYROIDAB in the last 72 hours. Anemia Panel: No results for input(s): VITAMINB12, FOLATE, FERRITIN, TIBC, IRON, RETICCTPCT in the last 72 hours. Sepsis Labs: Recent Labs  Lab 04/30/17 1209  PROCALCITON 0.50    Recent Results (from the past 240 hour(s))  Gram stain     Status: None   Collection Time: 05/05/17  2:49 PM  Result Value Ref Range  Status   Specimen Description FLUID PERITONEAL  Final   Special Requests NONE  Final   Gram Stain   Final    CYTOSPIN SMEAR WBC PRESENT,BOTH PMN AND MONONUCLEAR NO ORGANISMS SEEN    Report Status 05/05/2017 FINAL  Final         Radiology Studies: Ct Abdomen Pelvis Wo Contrast  Result Date: 05/04/2017 CLINICAL DATA:  Diverticulitis post perforation and abscess, post drainage procedure, follow-up, question and colocutaneous fistula, mild leukocytosis EXAM: CT ABDOMEN AND PELVIS WITHOUT CONTRAST TECHNIQUE: Multidetector CT imaging of the abdomen and pelvis was performed following the standard protocol without IV contrast. Sagittal and coronal MPR images reconstructed from axial data set. Patient drank dilute oral contrast for exam. COMPARISON:  04/19/2017 FINDINGS: Lower chest: Moderate BILATERAL pleural effusions. Compressive atelectasis of the lower lobes with question consolidation in RIGHT lower lobe. Hepatobiliary: Gallbladder and liver normal appearance Pancreas: Normal appearance Spleen: Normal appearance Adrenals/Urinary Tract: Adrenal glands, kidneys, and ureters normal appearance. Bladder is incompletely distended. Bladder wall appears mildly thickened, question due to true wall thickening such as from prior pelvic inflammation or an artifact from underdistention Stomach/Bowel: Rectal tube with question mild rectal wall thickening. Sigmoid diverticulosis changes. Remainder of colon and small bowel loops unremarkable. Stomach decompressed. Vascular/Lymphatic: Atherosclerotic calcifications aorta and iliac arteries without aneurysm. No definite adenopathy. Reproductive: Minimal prostatic enlargement. Seminal vesicles unremarkable. Other: Pigtail drainage catheter at site of prior abscess without residual associated fluid collection. Scattered free fluid with a focal area of prominent fluid in the lateral LEFT mid abdomen, question free fluid versus loculated, appears to be displacing bowel  loops, collection of fluid at this site overall measuring approximately 13.3 x 9.7 x 16.6 cm. No definite free intraperitoneal air. No hernia. Musculoskeletal: Remarkable IMPRESSION: No residual fluid collection at site of pigtail drainage catheter in RIGHT pelvis. Moderate BILATERAL pleural effusions with compressive atelectasis of both lower lobes and question RIGHT lower lobe consolidation. Scattered ascites with question loculated versus 3 collection in the LEFT mid abdomen, appears to be the displacing bowel loops, overall 13.3 x 9.7 x 16.6 cm in size ; this could represent sterile or infected fluid and if an infected collection is suspected consider aspiration. Electronically Signed   By: Lavonia Dana M.D.   On: 05/04/2017 20:04   Ir Paracentesis  Result Date: 05/05/2017 INDICATION: Patient with history of diverticulitis, now with large intra-abdominal fluid collection suspicious for loculated ascites. Is made for diagnostic and therapeutic paracentesis. EXAM: ULTRASOUND GUIDED DIAGNOSTIC AND THERAPEUTIC PARACENTESIS MEDICATIONS: 10 mL 2% lidocaine COMPLICATIONS: None immediate. PROCEDURE: Informed written consent was obtained from the patient after a discussion of the  risks, benefits and alternatives to treatment. A timeout was performed prior to the initiation of the procedure. Initial ultrasound scanning demonstrates a large amount of ascites within the left lateral abdomen. The left lateral abdomen was prepped and draped in the usual sterile fashion. 2% lidocaine was used for local anesthesia. Following this, a 19 gauge, 7-cm, Yueh catheter was introduced. An ultrasound image was saved for documentation purposes. The paracentesis was performed. The catheter was removed and a dressing was applied. The patient tolerated the procedure well without immediate post procedural complication. FINDINGS: A total of approximately 600 mL of clear, yellow fluid was removed. Samples were sent to the laboratory as  requested by the clinical team. IMPRESSION: Successful ultrasound-guided diagnostic and therapeutic paracentesis yielding 600 mL of peritoneal fluid. Read by:  Brynda Greathouse PA-C Electronically Signed   By: Aletta Edouard M.D.   On: 05/05/2017 16:06        Scheduled Meds: . feeding supplement (ENSURE ENLIVE)  237 mL Oral QID  . feeding supplement (PRO-STAT SUGAR FREE 64)  30 mL Oral BID  . folic acid  1 mg Oral QHS  . guaiFENesin  1,200 mg Oral BID  . heparin subcutaneous  5,000 Units Subcutaneous Q8H  . mouth rinse  15 mL Mouth Rinse BID  . multivitamin with minerals  1 tablet Oral Daily  . pantoprazole  40 mg Oral QHS  . saccharomyces boulardii  250 mg Oral BID  . thiamine  100 mg Oral QHS  . traZODone  50 mg Oral QHS   Continuous Infusions: . sodium chloride    . piperacillin-tazobactam (ZOSYN)  IV 3.375 g (05/06/17 0245)     LOS: 25 days    Kayleen Memos, MD [facility] Pager [phone]  If 7PM-7AM, please contact night-coverage [email or web address] Password [email or web address] 05/06/2017, 8:22 AM

## 2017-05-06 NOTE — Progress Notes (Signed)
Physical Therapy Treatment Patient Details Name: Blanchard Willhite MRN: [id number] DOB: 03/19/1961 Today's Date: 05/06/2017    History of Present Illness Pt admitted to [facility] and transferred on 04/02/17 with c/o abdominal pain.  Pt presented with ETOH withdrawls, liver abscess and SBO on TPN.  10/23 presents with hypoxia and increased WBC count, 10/25 intubated, 10/26 scans show PNA and fluid overload, patient extubated on 04/17/17.  Chart review shows COPD, VDRF, anemia, AKI and hypokalemia in addition to previous listed statements.  PMH: ETOH abuse.  11/8 R thoracentesis.    PT Comments    Pt in bed upon arrival. Despite max encouragement pt declined transfers and ambulation because of fatigue and pain. Agreed to perform supine exercises. Pt able to complete all exercises with increased time and VCs on technique. Required assistance on SLRs due to quad lag and weakness. Pt would benefit from skilled PT to increase endurance, functional mobility and independence.   Follow Up Recommendations  CIR     Equipment Recommendations  Rolling walker with 5" wheels;3in1 (PT)    Recommendations for Other Services       Precautions / Restrictions Precautions Precautions: Fall Precaution Comments: Drain, Flexiseal, catheter Restrictions Weight Bearing Restrictions: No    Mobility  Bed Mobility Overal bed mobility: Needs Assistance Bed Mobility: Rolling Rolling: Min assist         General bed mobility comments: Pt declined transfers or ambulation despite max encouragement. Able to scoot up in bed with min assist and use of rails.  Transfers                    Ambulation/Gait                 Stairs            Wheelchair Mobility    Modified Rankin (Stroke Patients Only)       Balance                                            Cognition Arousal/Alertness: Awake/alert Behavior During Therapy: WFL for tasks  assessed/performed Overall Cognitive Status: Within Functional Limits for tasks assessed                                        Exercises Total Joint Exercises Towel Squeeze: AROM;Both;15 reps;Supine General Exercises - Lower Extremity Ankle Circles/Pumps: AROM;Both;20 reps;Supine Quad Sets: AROM;Both;15 reps;Supine Short Arc Quad: AROM;Both;15 reps;Supine Heel Slides: AROM;Both;15 reps;Supine Hip ABduction/ADduction: AROM;Both;15 reps;Supine Straight Leg Raises: AAROM;Both;15 reps;Supine    General Comments        Pertinent Vitals/Pain Pain Assessment: 0-10 Pain Score: 10-Worst pain ever Pain Location: stomach Pain Descriptors / Indicators: Aching;Grimacing;Guarding;Moaning Pain Intervention(s): Limited activity within patient's tolerance;Monitored during session;Repositioned    Home Living                      Prior Function            PT Goals (current goals can now be found in the care plan section) Acute Rehab PT Goals Patient Stated Goal: wants to get back home and back to independence to care for his son PT Goal Formulation: With patient Time For Goal Achievement: 05/18/17 Potential to Achieve Goals: Good Progress towards PT goals: Progressing  toward goals    Frequency    Min 3X/week      PT Plan Current plan remains appropriate    Co-evaluation              AM-PAC PT "6 Clicks" Daily Activity  Outcome Measure  Difficulty turning over in bed (including adjusting bedclothes, sheets and blankets)?: Unable Difficulty moving from lying on back to sitting on the side of the bed? : Unable Difficulty sitting down on and standing up from a chair with arms (e.g., wheelchair, bedside commode, etc,.)?: Unable Help needed moving to and from a bed to chair (including a wheelchair)?: A Little Help needed walking in hospital room?: A Little Help needed climbing 3-5 steps with a railing? : A Lot 6 Click Score: 11    End of Session  Equipment Utilized During Treatment: Oxygen;Gait belt Activity Tolerance: Patient tolerated treatment well Patient left: with call bell/phone within reach;in bed;with bed alarm set Nurse Communication: Mobility status PT Visit Diagnosis: Unsteadiness on feet (R26.81);Muscle weakness (generalized) (M62.81);Other abnormalities of gait and mobility (R26.89)     Time: 1610-9604 PT Time Calculation (min) (ACUTE ONLY): 30 min  Charges:  $Therapeutic Exercise: 23-37 mins                    G Codes:       Janna Arch, SPTA   Janna Arch 05/06/2017, 5:05 PM

## 2017-05-06 NOTE — Progress Notes (Signed)
Inpatient Rehabilitation  Spoke with [place] via phone and she confirmed that Clair Gulling will have [place] assist at home.  She's not sure if she and friends can provide more assist, such as current level of need 24/7 Min A, but that they could try.  Plan to continue to follow for timing of medical readiness and patient's decision.  Call if questions.   Carmelia Roller., CCC/SLP Admission Coordinator  [place]  Cell [phone]

## 2017-05-06 NOTE — Progress Notes (Signed)
Supervising Physician: Jacqulynn Cadet  Patient Status:  [facility] - In-pt  Chief Complaint: Intra-abdominal abscess, loculated ascites  Subjective: Belly less distended after paracentesis yesterday.  Suspect still has loculated fluid collections.   Allergies: Patient has no known allergies.  Medications: Prior to Admission medications   Not on File     Vital Signs: BP 111/68 (BP Location: Left Arm)   Pulse 91   Temp 98.3 F (36.8 C) (Oral)   Resp 20   Ht 5\' 11"  (1.803 m)   Wt 130 lb 15.3 oz (59.4 kg)   SpO2 98%   BMI 18.26 kg/m   Physical Exam  NAD, alert Abd: Distended, non-tender, RLQ drain in place with yellow, feculent output. 30 mL output recorded yesterday.   Imaging: Ct Abdomen Pelvis Wo Contrast  Result Date: 05/04/2017 CLINICAL DATA:  Diverticulitis post perforation and abscess, post drainage procedure, follow-up, question and colocutaneous fistula, mild leukocytosis EXAM: CT ABDOMEN AND PELVIS WITHOUT CONTRAST TECHNIQUE: Multidetector CT imaging of the abdomen and pelvis was performed following the standard protocol without IV contrast. Sagittal and coronal MPR images reconstructed from axial data set. Patient drank dilute oral contrast for exam. COMPARISON:  04/19/2017 FINDINGS: Lower chest: Moderate BILATERAL pleural effusions. Compressive atelectasis of the lower lobes with question consolidation in RIGHT lower lobe. Hepatobiliary: Gallbladder and liver normal appearance Pancreas: Normal appearance Spleen: Normal appearance Adrenals/Urinary Tract: Adrenal glands, kidneys, and ureters normal appearance. Bladder is incompletely distended. Bladder wall appears mildly thickened, question due to true wall thickening such as from prior pelvic inflammation or an artifact from underdistention Stomach/Bowel: Rectal tube with question mild rectal wall thickening. Sigmoid diverticulosis changes. Remainder of colon and small bowel loops unremarkable. Stomach decompressed.  Vascular/Lymphatic: Atherosclerotic calcifications aorta and iliac arteries without aneurysm. No definite adenopathy. Reproductive: Minimal prostatic enlargement. Seminal vesicles unremarkable. Other: Pigtail drainage catheter at site of prior abscess without residual associated fluid collection. Scattered free fluid with a focal area of prominent fluid in the lateral LEFT mid abdomen, question free fluid versus loculated, appears to be displacing bowel loops, collection of fluid at this site overall measuring approximately 13.3 x 9.7 x 16.6 cm. No definite free intraperitoneal air. No hernia. Musculoskeletal: Remarkable IMPRESSION: No residual fluid collection at site of pigtail drainage catheter in RIGHT pelvis. Moderate BILATERAL pleural effusions with compressive atelectasis of both lower lobes and question RIGHT lower lobe consolidation. Scattered ascites with question loculated versus 3 collection in the LEFT mid abdomen, appears to be the displacing bowel loops, overall 13.3 x 9.7 x 16.6 cm in size ; this could represent sterile or infected fluid and if an infected collection is suspected consider aspiration. Electronically Signed   By: Lavonia Dana M.D.   On: 05/04/2017 20:04   Dg Chest Port 1 View  Result Date: 05/03/2017 CLINICAL DATA:  Hypoxemia EXAM: PORTABLE CHEST 1 VIEW COMPARISON:  05/02/2017 FINDINGS: Heart is borderline in size. Layering right pleural effusion with right lower lobe airspace opacity is similar prior study. Diffuse interstitial prominence throughout the lungs could reflect interstitial edema. Left base atelectasis. IMPRESSION: Continued layering right effusion with right lower lobe atelectasis or pneumonia. Suspect mild interstitial edema. Left base atelectasis. Electronically Signed   By: Rolm Baptise M.D.   On: 05/03/2017 08:54   Ir Paracentesis  Result Date: 05/05/2017 INDICATION: Patient with history of diverticulitis, now with large intra-abdominal fluid collection  suspicious for loculated ascites. Is made for diagnostic and therapeutic paracentesis. EXAM: ULTRASOUND GUIDED DIAGNOSTIC AND THERAPEUTIC PARACENTESIS MEDICATIONS: 10  mL 2% lidocaine COMPLICATIONS: None immediate. PROCEDURE: Informed written consent was obtained from the patient after a discussion of the risks, benefits and alternatives to treatment. A timeout was performed prior to the initiation of the procedure. Initial ultrasound scanning demonstrates a large amount of ascites within the left lateral abdomen. The left lateral abdomen was prepped and draped in the usual sterile fashion. 2% lidocaine was used for local anesthesia. Following this, a 19 gauge, 7-cm, Yueh catheter was introduced. An ultrasound image was saved for documentation purposes. The paracentesis was performed. The catheter was removed and a dressing was applied. The patient tolerated the procedure well without immediate post procedural complication. FINDINGS: A total of approximately 600 mL of clear, yellow fluid was removed. Samples were sent to the laboratory as requested by the clinical team. IMPRESSION: Successful ultrasound-guided diagnostic and therapeutic paracentesis yielding 600 mL of peritoneal fluid. Read by:  Brynda Greathouse PA-C Electronically Signed   By: Aletta Edouard M.D.   On: 05/05/2017 16:06    Labs:  CBC: Recent Labs    05/03/17 0319 05/04/17 0216 05/05/17 0324 05/06/17 0828  WBC 15.2* 11.7* 12.5* 10.4  HGB 9.8* 9.3* 9.4* 9.2*  HCT 30.1* 28.8* 29.6* 28.6*  PLT 505* 438* 508* 491*    COAGS: Recent Labs    04/13/17 0952  INR 1.24  APTT 35    BMP: Recent Labs    05/02/17 0238 05/04/17 0216 05/05/17 0324 05/06/17 0828  NA 136 137 135 137  K 3.3* 2.9* 3.8 3.6  CL 97* 98* 97* 101  CO2 25 28 28 26   GLUCOSE 102* 100* 99 94  BUN 34* 37* 35* 30*  CALCIUM 8.2* 8.0* 8.1* 8.2*  CREATININE 2.55* 2.36* 2.08* 1.87*  GFRNONAA 27* 30* 34* 39*  GFRAA 31* 34* 40* 45*    LIVER FUNCTION  TESTS: Recent Labs    04/27/17 0335 04/28/17 0209 04/29/17 0224 04/30/17 0418 05/04/17 0216  BILITOT 0.9 0.7 0.7  --  0.5  AST 17 19 16   --  13*  ALT 8* 9* 9*  --  6*  ALKPHOS 58 69 67  --  73  PROT 5.9* 6.5 6.6  --  6.4*  ALBUMIN 1.7* 1.8* 1.9* 1.8* 1.7*    Assessment and Plan: Intra-abdominal abscess s/p drain placement at [facility] on 04/03/17 Patient with continued feculent output.  Has had recent drain injections which demonstrated same.  Currently to gravity bag and daily flushes. Patient says he is considering surgery.  Continue current plan per medical/surgical team.  IR to follow while drain remains in place.   Electronically Signed: Docia Barrier, PA 05/06/2017, 2:33 PM   I spent a total of 15 Minutes at the the patient's bedside AND on the patient's hospital floor or unit, greater than 50% of which was counseling/coordinating care for intra-abdominal abscess

## 2017-05-06 NOTE — Progress Notes (Signed)
Nutrition Follow-up  DOCUMENTATION CODES:   Non-severe (moderate) malnutrition in context of chronic illness  INTERVENTION:   -Continue MVI daily -Continue Ensure Enlive po QID, each supplement provides 350 kcal and 20 grams of protein -Continue Prostat BID, each supplement provides 100 kcals and 15 grams protein  NUTRITION DIAGNOSIS:   Moderate Malnutrition related to chronic illness(ETOH abuse) as evidenced by mild fat depletion, mild muscle depletion.  Ongoing  GOAL:   Patient will meet greater than or equal to 90% of their needs  Progressing  MONITOR:   PO intake, Supplement acceptance, Labs, I & O's, Weight trends  REASON FOR ASSESSMENT:   Consult Assessment of nutrition requirement/status  ASSESSMENT:   56 year old male with PMH of ETOH abuse who was admitted to [facility] on 10/18 with diverticular abscess; hospital course complicated by DT's, development of a SBO, and PNA; required intubation on 10/25. He was transferred to [facility] on 10/27 with worsening renal failure, ARDS, aspiration PNA.  11/6- hypoglycemic event (CBGS: 62); reviewed nephrology note, no need for HD at this time 11/7- reviewed surgery note; plan to control leak and continue antibiotics; potential surgery in 6-8 weeks 11/8- s/p rt thoracentesis (1.3 L removed) 11/10- SLP evaluation- continue regular consistency diet with thin liquids, IJ cath removed 11/18- transferred from SDU to medical floor 11/20- s/p lt paracentesis, 600 ml fluid removed 11/21- per surgery note, plan to d/c condom cath and rectal tube, check c-diff  Pt receiving nursing care at time of visit.   Noted large fluctuations in wt (125-146# this week). Unsure of accuracy of weights.   Intake remains poor (PO: 0-50% of meal). However, pt taking supplements very well, where he is receiving the majority of his intake (pt receiving approximately 1600 kcals and 110 kcals from supplements- about 80% of estimated kcal needs and  100% of protein needs).   CIR following for potential admission and medical readiness for discharge.  Medications reviewed and include folvite, florastor,and vitamin B-1.   Labs reviewed.  Diet Order:  Diet heart healthy/carb modified Room service appropriate? Yes; Fluid consistency: Thin  EDUCATION NEEDS:   Education needs have been addressed  Skin:  Skin Assessment: Skin Integrity Issues: Skin Integrity Issues:: Stage I Stage I: sacrum  Last BM:  05/01/17  Height:   Ht Readings from Last 1 Encounters:  04/29/17 5\' 11"  (1.803 m)    Weight:   Wt Readings from Last 1 Encounters:  05/06/17 130 lb 15.3 oz (59.4 kg)    Ideal Body Weight:  78.2 kg  BMI:  Body mass index is 18.26 kg/m.  Estimated Nutritional Needs:   Kcal:  2000-2200  Protein:  100-115 grams  Fluid:  2-2.2 L    Taylen Osorto A. Jimmye Norman, RD, LDN, CDE Pager: [phone] After hours Pager: [phone]

## 2017-05-07 DIAGNOSIS — Z515 Encounter for palliative care: Secondary | ICD-10-CM

## 2017-05-07 LAB — CBC
HCT: 30.6 % — ABNORMAL LOW (ref 39.0–52.0)
Hemoglobin: 9.7 g/dL — ABNORMAL LOW (ref 13.0–17.0)
MCH: 30.2 pg (ref 26.0–34.0)
MCHC: 31.7 g/dL (ref 30.0–36.0)
MCV: 95.3 fL (ref 78.0–100.0)
PLATELETS: 482 10*3/uL — AB (ref 150–400)
RBC: 3.21 MIL/uL — ABNORMAL LOW (ref 4.22–5.81)
RDW: 16.7 % — ABNORMAL HIGH (ref 11.5–15.5)
WBC: 11.9 10*3/uL — ABNORMAL HIGH (ref 4.0–10.5)

## 2017-05-07 LAB — BASIC METABOLIC PANEL
Anion gap: 10 (ref 5–15)
BUN: 31 mg/dL — ABNORMAL HIGH (ref 6–20)
CO2: 28 mmol/L (ref 22–32)
CREATININE: 1.77 mg/dL — AB (ref 0.61–1.24)
Calcium: 8.3 mg/dL — ABNORMAL LOW (ref 8.9–10.3)
Chloride: 100 mmol/L — ABNORMAL LOW (ref 101–111)
GFR calc Af Amer: 48 mL/min — ABNORMAL LOW (ref >60)
GFR, EST NON AFRICAN AMERICAN: 42 mL/min — AB (ref >60)
Glucose, Bld: 108 mg/dL — ABNORMAL HIGH (ref 65–99)
Potassium: 3.7 mmol/L (ref 3.5–5.1)
SODIUM: 138 mmol/L (ref 135–145)

## 2017-05-07 LAB — URINE CULTURE: Culture: 20000 — AB

## 2017-05-07 MED ORDER — SODIUM CHLORIDE 0.9 % IV SOLN: 250.0000 mL | INTRAVENOUS | Status: DC | PRN

## 2017-05-07 MED ORDER — FUROSEMIDE 10 MG/ML IJ SOLN
40.0000 mg | Freq: Once | INTRAMUSCULAR | Status: AC
  Administered 2017-05-07: 40 mg via INTRAVENOUS
  Filled 2017-05-07: qty 4

## 2017-05-07 NOTE — Progress Notes (Signed)
Subjective: Alert.  Minimal pain.  Says he's eating well. Working with physical therapy Continues with feculent drainage from drain.  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 C.Diff negative Cultures from loculated left-sided ascites negative so far  Renal function has improved.  Nephrology has signed off.  Hospital course, complicated by DTs, acute respiratory  failure with hypoxia, ARDS, aspiration pneumonia and circulatory shock which required intubation and pressors.  Lungs are improving but has a little bit of air hunger when he ambulates in the hall and last chest x-ray on November 18 shows layering right effusion and right lower lobe atelectasis or pneumonia and mild interstitial edema.   Objective: Vital signs in last 24 hours: Temp:  [98.5 F (36.9 C)-98.6 F (37 C)] 98.6 F (37 C) (11/22 0441) Pulse Rate:  [90-96] 90 (11/22 0441) Resp:  [20] 20 (11/22 0441) BP: (102-124)/(66-71) 124/71 (11/22 0441) SpO2:  [98 %-100 %] 99 % (11/22 0441) FiO2 (%):  [40 %] 40 % (11/21 1509) Weight:  [59.4 kg (130 lb 15.3 oz)] 59.4 kg (130 lb 15.3 oz) (11/22 0500) Last BM Date: 05/06/17  Intake/Output from previous day: 11/21 0701 - 11/22 0700 In: 390 [P.O.:340; IV Piggyback:50] Out: 2525 [Urine:1525; Stool:1000] Intake/Output this shift: Total I/O In: -  Out: 2525 [Urine:1525; Stool:1000]   EXAM: General appearance: alert, cooperative and no distress GI: drain is feculent, mild distension, ongoing discomfort lower abdomen.  He has a condom cath, and flexiseal in place     Lab Results:  Recent Labs    05/06/17 0828 05/07/17 0205  WBC 10.4 11.9*  HGB 9.2* 9.7*  HCT 28.6* 30.6*  PLT 491* 482*   BMET Recent Labs    05/06/17 0828 05/07/17 0205  NA 137 138  K 3.6 3.7  CL 101 100*  CO2 26 28  GLUCOSE 94 108*  BUN 30* 31*  CREATININE 1.87* 1.77*  CALCIUM 8.2* 8.3*   PT/INR No results for input(s): LABPROT, INR in the last  72 hours. ABG No results for input(s): PHART, HCO3 in the last 72 hours.  Invalid input(s): PCO2, PO2  Studies/Results: Ir Paracentesis  Result Date: 05/05/2017 INDICATION: Patient with history of diverticulitis, now with large intra-abdominal fluid collection suspicious for loculated ascites. Is made for diagnostic and therapeutic paracentesis. EXAM: ULTRASOUND GUIDED DIAGNOSTIC AND THERAPEUTIC PARACENTESIS MEDICATIONS: 10 mL 2% lidocaine COMPLICATIONS: None immediate. PROCEDURE: Informed written consent was obtained from the patient after a discussion of the risks, benefits and alternatives to treatment. A timeout was performed prior to the initiation of the procedure. Initial ultrasound scanning demonstrates a large amount of ascites within the left lateral abdomen. The left lateral abdomen was prepped and draped in the usual sterile fashion. 2% lidocaine was used for local anesthesia. Following this, a 19 gauge, 7-cm, Yueh catheter was introduced. An ultrasound image was saved for documentation purposes. The paracentesis was performed. The catheter was removed and a dressing was applied. The patient tolerated the procedure well without immediate post procedural complication. FINDINGS: A total of approximately 600 mL of clear, yellow fluid was removed. Samples were sent to the laboratory as requested by the clinical team. IMPRESSION: Successful ultrasound-guided diagnostic and therapeutic paracentesis yielding 600 mL of peritoneal fluid. Read by:  Brynda Greathouse PA-C Electronically Signed   By: Aletta Edouard M.D.   On: 05/05/2017 16:06    Anti-infectives: Anti-infectives (From admission, onward)   Start     Dose/Rate Route Frequency Ordered Stop   05/04/17 0130  piperacillin-tazobactam (ZOSYN) IVPB 3.375 g     3.375 g 12.5 mL/hr over 240 Minutes Intravenous Every 8 hours 05/03/17 2211     04/28/17 1530  vancomycin (VANCOCIN) IVPB 750 mg/150 ml premix  Status:  Discontinued     750 mg 150  mL/hr over 60 Minutes Intravenous Every 24 hours 04/28/17 1424 05/01/17 1128   04/27/17 1400  piperacillin-tazobactam (ZOSYN) IVPB 3.375 g  Status:  Discontinued     3.375 g 12.5 mL/hr over 240 Minutes Intravenous Every 8 hours 04/27/17 1158 05/03/17 2211   04/26/17 1830  vancomycin (VANCOCIN) 1,250 mg in sodium chloride 0.9 % 250 mL IVPB  Status:  Discontinued     1,250 mg 166.7 mL/hr over 90 Minutes Intravenous Every 48 hours 04/26/17 1739 04/28/17 1424   04/24/17 1700  vancomycin (VANCOCIN) IVPB 1000 mg/200 mL premix  Status:  Discontinued     1,000 mg 200 mL/hr over 60 Minutes Intravenous  Once 04/24/17 1652 04/24/17 1656   04/24/17 1700  vancomycin (VANCOCIN) IVPB 1000 mg/200 mL premix  Status:  Discontinued     1,000 mg 200 mL/hr over 60 Minutes Intravenous Every 48 hours 04/24/17 1656 04/26/17 1739   04/23/17 1500  piperacillin-tazobactam (ZOSYN) IVPB 3.375 g  Status:  Discontinued     3.375 g 12.5 mL/hr over 240 Minutes Intravenous Every 12 hours 04/23/17 1107 04/27/17 1158   04/18/17 1800  piperacillin-tazobactam (ZOSYN) IVPB 3.375 g  Status:  Discontinued     3.375 g 12.5 mL/hr over 240 Minutes Intravenous Every 12 hours 04/18/17 1032 04/23/17 1107   04/18/17 1400  piperacillin-tazobactam (ZOSYN) IVPB 3.375 g  Status:  Discontinued     3.375 g 12.5 mL/hr over 240 Minutes Intravenous Every 8 hours 04/18/17 1031 04/18/17 1032   04/17/17 1200  piperacillin-tazobactam (ZOSYN) IVPB 3.375 g  Status:  Discontinued     3.375 g 100 mL/hr over 30 Minutes Intravenous Every 6 hours 04/17/17 1030 04/18/17 1031   04/17/17 1100  piperacillin-tazobactam (ZOSYN) IVPB 3.375 g  Status:  Discontinued     3.375 g 12.5 mL/hr over 240 Minutes Intravenous Every 8 hours 04/17/17 1019 04/17/17 1030   04/15/17 2125  Ampicillin-Sulbactam (UNASYN) 3 g in sodium chloride 0.9 % 100 mL IVPB  Status:  Discontinued     3 g 200 mL/hr over 30 Minutes Intravenous Every 8 hours 04/15/17 1529 04/17/17 1019    04/15/17 1100  ampicillin-sulbactam (UNASYN) 1.5 g in sodium chloride 0.9 % 50 mL IVPB  Status:  Discontinued     1.5 g 100 mL/hr over 30 Minutes Intravenous Every 12 hours 04/15/17 0937 04/15/17 1529   04/13/17 2200  levofloxacin (LEVAQUIN) IVPB 500 mg  Status:  Discontinued     500 mg 100 mL/hr over 60 Minutes Intravenous Every 48 hours 04/12/17 0114 04/12/17 0846   04/13/17 1800  piperacillin-tazobactam (ZOSYN) IVPB 2.25 g  Status:  Discontinued     2.25 g 100 mL/hr over 30 Minutes Intravenous Every 8 hours 04/13/17 1050 04/15/17 0937   04/12/17 0200  piperacillin-tazobactam (ZOSYN) IVPB 3.375 g  Status:  Discontinued     3.375 g 12.5 mL/hr over 240 Minutes Intravenous Every 8 hours 04/12/17 0103 04/13/17 1050   04/12/17 0000  levofloxacin (LEVAQUIN) IVPB 750 mg  Status:  Discontinued     750 mg 100 mL/hr over 90 Minutes Intravenous Every 48 hours 04/11/17 2230 04/12/17 0114      Assessment/Plan:  Acute diverticulitis with perforation. [name] 04/02/17 diverticulitis with drain placement - feculent drainage  600 ml paracentesis yellow fluid 05/05/17 - gram stain - no organisms so far  Basically he will ultimately need a colon resection.  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  This is not emergent, however is probably in his best interest. The only thing standing in the way of surgical intervention is his pulmonary status.  We will need internal medicine or pulmonary medicine to advise [place] once his pulmonary problems are optimized or resolved and he is acceptable candidate for general anesthesia.  At that point we will consider scheduling him for surgery   [facility] 04/11/17 - ARDS/pneumonia/COPD Bilateral pleura effusions DT"s - Heavy ETOH use preadmit Hx of tobacco use Anemia Chronic kidney disease - creatinine 2.08 FEN: carb mod/hearthealthy ID: vanc stopped 11/16, IV zosyn (11/2=>>  day 19).. C. difficile negative.   DVT:  Heparin Foley:  - condom cath and flexiseal   Plan:   continue PT.  Antibiotics.  Oral nutrition              Need input from medicine regarding status of pulmonary function and whether he is except we'll candidate for general anesthesia from a pulmonary standpoint .                May need CT chest to be sure that effusions are not loculated             On probiotics.   UA cloudy, with TNTC WBC per HPF - nitrate negative - culture ordered      LOS: 26 days    Adin Hector 05/07/2017

## 2017-05-07 NOTE — Progress Notes (Signed)
PROGRESS NOTE    Dominic Phillips  PZW:[id number] DOB: November 22, 1960 DOA: 04/11/2017 PCP: No primary care provider on file.  Brief Narrative: Assessment & Pla36 year old male with PMH of alcohol abuse presented to [facility] on 04/02/17 with complaints of abdominal pain without fevers, leukocytosis or diarrhea. CT abdomen revealed pericolonic inflammation/fluid collection and he was admitted for diverticular abscess. General surgery was consulted who suggested placement of percutaneous drain by IR. Cultures grew Escherichia coli. Hospital course complicated by DTs, acute respiratory failure with hypoxia/ARDS/aspiration pneumonia and circulatory shock for which he required intubation and pressors, acute kidney injury for which she required CRRT and IHD 1. Shock resolved. Renal functions improving. General surgery continue to follow. Nephrology signed off.  Patient seen and examined at his bedside. Continues to have flexiceal and percutaneous drainage. Admits to intermittent pain dull  5/10, at its worse 7/10 improved with dilaudid. Admits to intermittent nausea with no vomiting.    Active Problems:   Acute respiratory distress syndrome (ARDS) (HCC)   Acute kidney injury ([place])   Pressure injury of skin   Colonic diverticular abscess   COPD (chronic obstructive pulmonary disease) (HCC)   Dyspnea and respiratory abnormalities   HCAP (healthcare-associated pneumonia)   Acute blood loss anemia   Anemia of chronic disease   ETOH abuse   Tobacco abuse   Tachycardia   Tachypnea   Leukocytosis   Post-operative pain   Acute respiratory failure with hypoxia (HCC)   Palliative care by specialist   Acute respiratory failure with hypoxia/ARDS/VDRF - multifactorial: 2/2 Pulmonary edema vs pleural effusions vs atelectasis vs pneumonia. -weaned down from 15 L/m HFNC oxygen to 3 L/m. Continue weaning  oxygen as tolerated to keep saturations >90%, cotninue incentive spirometry, increase  mobilization and activity.  - Required intubation from 10/27 to 11/2 -continue diuresis as tolerated Follow CXR intermittently  -s/p thoracentesis (1.3L removed; transudate) -patient on zosyn  Hypokalemia-- -continue monitoring electrolytes and replete as needed   Diverticulitis with perforation and abscess, S/P IR drain - Gen. 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 - Continue IV antibiotics - REPEAT CT 05/04/17-No residual fluid collection at site of pigtail drainage catheter in right pelvis. - patient with ongoing fistula; per surgery rec's will need sigmoidectomy and colostomy. -will optimize his pulmonary function/discussed with pulmonary service and then follow surgery rec's and clinical outcome.  Moderate BILATERAL pleural effusions  -with compressive atelectasis of both lower lobes and question right lower lobe consolidation. -patient afebrile and with stable breathing -continue zosyn  -repeat CXR in am -will discussed with pulmonary service, stability for further surgery.  Acute kidney injury status post CRRT 10/27 and intermittent dialysis. -Renal function improved and stable -renal service sign off -follow intermittent BMET to follow Cr trend  -maintain adequate hydration  -HD catheter removed on 11/10  Anemia of chronic disease.  -Continue iron replacement.  -Status post 1 unit of blood transfusion.  -follow Hgb trend  -no signs of acute bleeding   Moderate malnutrition with chronic illness and hx of alcohol abuse- -appreciated nutritional service input -continue ensure   DVT prophylaxis.  Heparin Code Status: Partial Family Communication: no family at bedside  Disposition Plan: Plan is for him to go to inpatient rehab once he is medically stable. CCS anticipating sigmoidectomy and colostomy once fully recover from pulmonary stand point.  Consultants:  General  surgery IR PCCM  Procedures: IR drain for acute diverticulitis with perforation.  Antimicrobials: Zosyn  Vitals:  05/06/17 2319 05/07/17 0441 05/07/17 0500 05/07/17 1503  BP: 107/66 124/71  109/78  Pulse: 96 90  89  Resp: 20 20  18   Temp: 98.5 F (36.9 C) 98.6 F (37 C)  98.2 F (36.8 C)  TempSrc: Oral Oral  Oral  SpO2: 98% 99%  99%  Weight:   59.4 kg (130 lb 15.3 oz)   Height:        Intake/Output Summary (Last 24 hours) at 05/07/2017 2231 Last data filed at 05/07/2017 1614 Gross per 24 hour  Intake 100 ml  Output 2045 ml  Net -1945 ml   Filed Weights   05/05/17 0700 05/06/17 0500 05/07/17 0500  Weight: 59.1 kg (130 lb 4.7 oz) 59.4 kg (130 lb 15.3 oz) 59.4 kg (130 lb 15.3 oz)    Examination: General exam: afebrile, in no distress, frail and underweight, no CP, no SOB.  Respiratory system: no wheezing, no frank crackles, patient with decrease BS at bases. Cardiovascular system: no JVD, no murmurs, no rubs, no gallops, RRR Gastrointestinal system: soft, mild distension, very little tenderness in LLQ, percutaneous drain in place. Positive BS. Central nervous system: oriented X 3, following commands appropriately, CN intact. Extremities: no edema, no cyanosis, no clubbing  Skin: patient with drain in place (having yellow feculent discharge); also with flexiseal  in place.  Psychiatry: judgement and insight appears normal; no SI or hallucinations.  Data Reviewed: I have personally reviewed following labs and imaging studies  CBC: Recent Labs  Lab 05/03/17 0319 05/04/17 0216 05/05/17 0324 05/06/17 0828 05/07/17 0205  WBC 15.2* 11.7* 12.5* 10.4 11.9*  NEUTROABS  --  8.0* 8.3*  --   --   HGB 9.8* 9.3* 9.4* 9.2* 9.7*  HCT 30.1* 28.8* 29.6* 28.6* 30.6*  MCV 93.2 93.5 94.3 95.3 95.3  PLT 505* 438* 508* 491* 518*   Basic Metabolic Panel: Recent Labs  Lab 05/02/17 0238 05/03/17 1311 05/04/17 0216 05/05/17 0324 05/06/17 0828 05/07/17 0205  NA 136  --  137  135 137 138  K 3.3*  --  2.9* 3.8 3.6 3.7  CL 97*  --  98* 97* 101 100*  CO2 25  --  28 28 26 28   GLUCOSE 102*  --  100* 99 94 108*  BUN 34*  --  37* 35* 30* 31*  CREATININE 2.55*  --  2.36* 2.08* 1.87* 1.77*  CALCIUM 8.2*  --  8.0* 8.1* 8.2* 8.3*  MG  --  1.6*  --   --   --   --    GFR: Estimated Creatinine Clearance: 40.1 mL/min (A) (by C-G formula based on SCr of 1.77 mg/dL (H)).   Liver Function Tests: Recent Labs  Lab 05/04/17 0216  AST 13*  ALT 6*  ALKPHOS 73  BILITOT 0.5  PROT 6.4*  ALBUMIN 1.7*   CBG: Recent Labs  Lab 05/03/17 2141  GLUCAP 91    Recent Results (from the past 240 hour(s))  Culture, body fluid-bottle     Status: None (Preliminary result)   Collection Time: 05/05/17  2:49 PM  Result Value Ref Range Status   Specimen Description FLUID PERITONEAL  Final   Special Requests BOTTLES DRAWN AEROBIC AND ANAEROBIC  Final   Culture NO GROWTH 2 DAYS  Final   Report Status PENDING  Incomplete  Gram stain     Status: None   Collection Time: 05/05/17  2:49 PM  Result Value Ref Range Status   Specimen Description FLUID PERITONEAL  Final   Special  Requests NONE  Final   Gram Stain   Final    CYTOSPIN SMEAR WBC PRESENT,BOTH PMN AND MONONUCLEAR NO ORGANISMS SEEN    Report Status 05/05/2017 FINAL  Final  Urine Culture     Status: Abnormal   Collection Time: 05/06/17  9:18 AM  Result Value Ref Range Status   Specimen Description URINE, RANDOM  Final   Special Requests NONE  Final   Culture 20,000 COLONIES/mL YEAST (A)  Final   Report Status 05/07/2017 FINAL  Final  C difficile quick scan w PCR reflex     Status: None   Collection Time: 05/06/17  9:18 AM  Result Value Ref Range Status   C Diff antigen NEGATIVE NEGATIVE Final   C Diff toxin NEGATIVE NEGATIVE Final   C Diff interpretation No C. difficile detected.  Final     Radiology Studies: No results found.   Scheduled Meds: . feeding supplement (ENSURE ENLIVE)  237 mL Oral QID  . feeding  supplement (PRO-STAT SUGAR FREE 64)  30 mL Oral BID  . folic acid  1 mg Oral QHS  . guaiFENesin  1,200 mg Oral BID  . heparin subcutaneous  5,000 Units Subcutaneous Q8H  . mouth rinse  15 mL Mouth Rinse BID  . multivitamin with minerals  1 tablet Oral Daily  . pantoprazole  40 mg Oral QHS  . saccharomyces boulardii  250 mg Oral BID  . thiamine  100 mg Oral QHS  . traZODone  50 mg Oral QHS   Continuous Infusions: . sodium chloride    . piperacillin-tazobactam (ZOSYN)  IV 3.375 g (05/07/17 1802)     LOS: 32 days    Barton Dubois, MD [facility] Pager [phone]  If 7PM-7AM, please contact night-coverage [email or web address] Password [facility] 05/07/2017, 10:31 PM

## 2017-05-07 NOTE — Progress Notes (Addendum)
Daily Progress Note   Patient Name: Dominic Phillips       Date: 05/07/2017 DOB: [birth year]  Age: 56 y.o. MRN#: [id number] Attending Physician: Barton Dubois, MD Primary Care Physician: No primary care provider on file. Admit Date: 04/11/2017  Reason for Consultation/Follow-up: Establishing goals of care  Subjective: Mr. Drinkard is resting in bed. He is in good spirits. He states he isn't going to worry about things anymore, he is trusting God. He states he has a son Coralyn Mark who is 28 years old, who has CP requiring total care who is staying with a CNA who has been helping them for a while. He states she does a good job, but nobody can take care of him like daddy can. He states he was advised he would need to have a colostomy.  He states he is okay with this plan. He states he wants to stop drinking alcohol.   Length of Stay: 26  Current Medications: Scheduled Meds:  . feeding supplement (ENSURE ENLIVE)  237 mL Oral QID  . feeding supplement (PRO-STAT SUGAR FREE 64)  30 mL Oral BID  . folic acid  1 mg Oral QHS  . guaiFENesin  1,200 mg Oral BID  . heparin subcutaneous  5,000 Units Subcutaneous Q8H  . mouth rinse  15 mL Mouth Rinse BID  . multivitamin with minerals  1 tablet Oral Daily  . pantoprazole  40 mg Oral QHS  . saccharomyces boulardii  250 mg Oral BID  . thiamine  100 mg Oral QHS  . traZODone  50 mg Oral QHS    Continuous Infusions: . sodium chloride    . piperacillin-tazobactam (ZOSYN)  IV 3.375 g (05/07/17 0933)    PRN Meds: sodium chloride, acetaminophen, fentaNYL (SUBLIMAZE) injection, ipratropium-albuterol, lidocaine, ondansetron (ZOFRAN) IV, zolpidem  Physical Exam  Constitutional: No distress.  HENT:  Head: Normocephalic.  Eyes: EOM are normal.  Pulmonary/Chest:  Effort normal.  O2 in place.   Neurological: He is alert.  Oriented  Skin: Skin is dry.            Vital Signs: BP 124/71 (BP Location: Left Arm)   Pulse 90   Temp 98.6 F (37 C) (Oral)   Resp 20   Ht 5\' 11"  (1.803 m)   Wt 59.4 kg (130 lb 15.3 oz) Comment: Pt.Refused  SpO2 99%   BMI 18.26 kg/m  SpO2: SpO2: 99 % O2 Device: O2 Device: Nasal Cannula O2 Flow Rate: O2 Flow Rate (L/min): 2 L/min  Intake/output summary:   Intake/Output Summary (Last 24 hours) at 05/07/2017 1137 Last data filed at 05/07/2017 9323 Gross per 24 hour  Intake 320 ml  Output 2775 ml  Net -2455 ml   LBM: Last BM Date: 05/06/17 Baseline Weight: Weight: 68.3 kg (150 lb 9.2 oz) Most recent weight: Weight: 59.4 kg (130 lb 15.3 oz)(Pt.Refused)       Palliative Assessment/Data: 60%    Flowsheet Rows     Most Recent Value  Intake Tab  Referral Department  Hospitalist  Unit at Time of Referral  Intermediate Care Unit  Palliative Care Primary Diagnosis  Sepsis/Infectious Disease  Date Notified  05/01/17  Palliative Care Type  New Palliative care  Reason for referral  Clarify Goals of Care  Date of Admission  04/11/17  Date first seen by Palliative Care  05/01/17  # of days Palliative referral response time  0 Day(s)  # of days IP prior to Palliative referral  20  Clinical Assessment  Palliative Performance Scale Score  40%  Pain Max last 24 hours  Not able to report  Pain Min Last 24 hours  Not able to report  Dyspnea Max Last 24 Hours  Not able to report  Dyspnea Min Last 24 hours  Not able to report  Nausea Max Last 24 Hours  Not able to report  Nausea Min Last 24 Hours  Not able to report  Anxiety Max Last 24 Hours  Not able to report  Anxiety Min Last 24 Hours  Not able to report  Other Max Last 24 Hours  Not able to report  Psychosocial & Spiritual Assessment  Palliative Care Outcomes  Patient/Family meeting held?  Yes  Who was at the meeting?  pt  Patient/Family wishes: Interventions  discontinued/not started   Mechanical Ventilation      Patient Active Problem List   Diagnosis Date Noted  . Acute respiratory failure with hypoxia ([place])   . Palliative care by specialist   . Dyspnea and respiratory abnormalities   . HCAP (healthcare-associated pneumonia)   . Acute blood loss anemia   . Anemia of chronic disease   . ETOH abuse   . Tobacco abuse   . Tachycardia   . Tachypnea   . Leukocytosis   . Post-operative pain   . Pressure injury of skin 04/19/2017  . Colonic diverticular abscess 04/19/2017  . COPD (chronic obstructive pulmonary disease) ([place]) 04/19/2017  . Acute kidney injury ([place])   . Acute respiratory distress syndrome (ARDS) ([place]) 04/11/2017    Palliative Care Assessment & Plan   Patient Profile: 56 y.o. male  with past medical history of COPD alcohol use disorder admitted to [facility] on 04/02/2017 with complaints of abdominal pain and diarrhea.  CT abdomen revealed pericolonic inflammation/fluid collection.  He was admitted for diverticular abscess.  Assessment: Mr. Schoenfelder is showing improvement. He has prepared himself emotionally for upcoming colostomy.    Recommendations/Plan:  Continue current level of care, patient is okay with operative management.       Code Status:    Code Status Orders  (From admission, onward)        Start     Ordered   04/17/17 1021  Limited resuscitation (code)  Continuous    Question Answer Comment  In the event of cardiac or respiratory ARREST: Initiate Code  Blue, Call Rapid Response Yes   In the event of cardiac or respiratory ARREST: Perform CPR Yes   In the event of cardiac or respiratory ARREST: Perform Intubation/Mechanical Ventilation No   In the event of cardiac or respiratory ARREST: Use NIPPV/BiPAp only if indicated Yes   In the event of cardiac or respiratory ARREST: Administer ACLS medications if indicated Yes   In the event of cardiac or respiratory ARREST: Perform Defibrillation or  Cardioversion if indicated Yes      04/17/17 1042    Code Status History    Date Active Date Inactive Code Status Order ID Comments User Context   04/11/2017 16:13 04/17/2017 10:42 Full Code [id number]  Jamesetta So, MD Inpatient       Prognosis:   Unable to determine  Discharge Planning:  To Be Determined  Care plan was discussed with Dr. Dyann Kief  Thank you for allowing the Palliative Medicine Team to assist in the care of this patient.   Total Time 35 min Prolonged Time Billed  No      Greater than 50%  of this time was spent counseling and coordinating care related to the above assessment and plan.  Asencion Gowda, NP  Please contact Palliative Medicine Team phone at [phone] for questions and concerns.

## 2017-05-07 NOTE — Progress Notes (Signed)
Referring Physician(s): Dr Leane Para  Supervising Physician: Marybelle Killings  Patient Status:  [facility] - In-pt  Chief Complaint:  Diverticular abscess Drain placed in [place] 04/02/17  Subjective:  Drain in place OP feculent Para 11/20: no grwoth  CT 11/19: IMPRESSION: No residual fluid collection at site of pigtail drainage catheter in RIGHT pelvis. Moderate BILATERAL pleural effusions with compressive atelectasis of both lower lobes and question RIGHT lower lobe consolidation. Scattered ascites with question loculated versus 3 collection in the LEFT mid abdomen, appears to be the displacing bowel loops, overall 13.3 x 9.7 x 16.6 cm in size ; this could represent sterile or infected fluid and if an infected collection is suspected consider aspiration.   Allergies: Patient has no known allergies.  Medications: Prior to Admission medications   Not on File     Vital Signs: BP 124/71 (BP Location: Left Arm)   Pulse 90   Temp 98.6 F (37 C) (Oral)   Resp 20   Ht 5\' 11"  (1.803 m)   Wt 130 lb 15.3 oz (59.4 kg) Comment: Pt.Refused  SpO2 99%   BMI 18.26 kg/m   Physical Exam  Constitutional: He is oriented to person, place, and time.  Abdominal: Soft. Bowel sounds are normal.  Musculoskeletal: Normal range of motion.  Neurological: He is alert and oriented to person, place, and time.  Skin: Skin is warm and dry.  Site of drain is clean Reddened slightly at skin site OP is yellow thick feculent material   Nursing note and vitals reviewed.   Imaging: Ct Abdomen Pelvis Wo Contrast  Result Date: 05/04/2017 CLINICAL DATA:  Diverticulitis post perforation and abscess, post drainage procedure, follow-up, question and colocutaneous fistula, mild leukocytosis EXAM: CT ABDOMEN AND PELVIS WITHOUT CONTRAST TECHNIQUE: Multidetector CT imaging of the abdomen and pelvis was performed following the standard protocol without IV contrast. Sagittal and coronal MPR images  reconstructed from axial data set. Patient drank dilute oral contrast for exam. COMPARISON:  04/19/2017 FINDINGS: Lower chest: Moderate BILATERAL pleural effusions. Compressive atelectasis of the lower lobes with question consolidation in RIGHT lower lobe. Hepatobiliary: Gallbladder and liver normal appearance Pancreas: Normal appearance Spleen: Normal appearance Adrenals/Urinary Tract: Adrenal glands, kidneys, and ureters normal appearance. Bladder is incompletely distended. Bladder wall appears mildly thickened, question due to true wall thickening such as from prior pelvic inflammation or an artifact from underdistention Stomach/Bowel: Rectal tube with question mild rectal wall thickening. Sigmoid diverticulosis changes. Remainder of colon and small bowel loops unremarkable. Stomach decompressed. Vascular/Lymphatic: Atherosclerotic calcifications aorta and iliac arteries without aneurysm. No definite adenopathy. Reproductive: Minimal prostatic enlargement. Seminal vesicles unremarkable. Other: Pigtail drainage catheter at site of prior abscess without residual associated fluid collection. Scattered free fluid with a focal area of prominent fluid in the lateral LEFT mid abdomen, question free fluid versus loculated, appears to be displacing bowel loops, collection of fluid at this site overall measuring approximately 13.3 x 9.7 x 16.6 cm. No definite free intraperitoneal air. No hernia. Musculoskeletal: Remarkable IMPRESSION: No residual fluid collection at site of pigtail drainage catheter in RIGHT pelvis. Moderate BILATERAL pleural effusions with compressive atelectasis of both lower lobes and question RIGHT lower lobe consolidation. Scattered ascites with question loculated versus 3 collection in the LEFT mid abdomen, appears to be the displacing bowel loops, overall 13.3 x 9.7 x 16.6 cm in size ; this could represent sterile or infected fluid and if an infected collection is suspected consider aspiration.  Electronically Signed   By: Elta Guadeloupe  Thornton Papas M.D.   On: 05/04/2017 20:04   Ir Paracentesis  Result Date: 05/05/2017 INDICATION: Patient with history of diverticulitis, now with large intra-abdominal fluid collection suspicious for loculated ascites. Is made for diagnostic and therapeutic paracentesis. EXAM: ULTRASOUND GUIDED DIAGNOSTIC AND THERAPEUTIC PARACENTESIS MEDICATIONS: 10 mL 2% lidocaine COMPLICATIONS: None immediate. PROCEDURE: Informed written consent was obtained from the patient after a discussion of the risks, benefits and alternatives to treatment. A timeout was performed prior to the initiation of the procedure. Initial ultrasound scanning demonstrates a large amount of ascites within the left lateral abdomen. The left lateral abdomen was prepped and draped in the usual sterile fashion. 2% lidocaine was used for local anesthesia. Following this, a 19 gauge, 7-cm, Yueh catheter was introduced. An ultrasound image was saved for documentation purposes. The paracentesis was performed. The catheter was removed and a dressing was applied. The patient tolerated the procedure well without immediate post procedural complication. FINDINGS: A total of approximately 600 mL of clear, yellow fluid was removed. Samples were sent to the laboratory as requested by the clinical team. IMPRESSION: Successful ultrasound-guided diagnostic and therapeutic paracentesis yielding 600 mL of peritoneal fluid. Read by:  Brynda Greathouse PA-C Electronically Signed   By: Aletta Edouard M.D.   On: 05/05/2017 16:06    Labs:  CBC: Recent Labs    05/04/17 0216 05/05/17 0324 05/06/17 0828 05/07/17 0205  WBC 11.7* 12.5* 10.4 11.9*  HGB 9.3* 9.4* 9.2* 9.7*  HCT 28.8* 29.6* 28.6* 30.6*  PLT 438* 508* 491* 482*    COAGS: Recent Labs    04/13/17 0952  INR 1.24  APTT 35    BMP: Recent Labs    05/04/17 0216 05/05/17 0324 05/06/17 0828 05/07/17 0205  NA 137 135 137 138  K 2.9* 3.8 3.6 3.7  CL 98* 97* 101 100*    CO2 28 28 26 28   GLUCOSE 100* 99 94 108*  BUN 37* 35* 30* 31*  CALCIUM 8.0* 8.1* 8.2* 8.3*  CREATININE 2.36* 2.08* 1.87* 1.77*  GFRNONAA 30* 34* 39* 42*  GFRAA 34* 40* 45* 48*    LIVER FUNCTION TESTS: Recent Labs    04/27/17 0335 04/28/17 0209 04/29/17 0224 04/30/17 0418 05/04/17 0216  BILITOT 0.9 0.7 0.7  --  0.5  AST 17 19 16   --  13*  ALT 8* 9* 9*  --  6*  ALKPHOS 58 69 67  --  73  PROT 5.9* 6.5 6.6  --  6.4*  ALBUMIN 1.7* 1.8* 1.9* 1.8* 1.7*    Assessment and Plan:  Diverticular abscess drain in place OP remains thick yellow feculent Will follow plan per CCS  Electronically Signed: Tarl Cephas A, PA-C 05/07/2017, 10:52 AM   I spent a total of 15 Minutes at the the patient's bedside AND on the patient's hospital floor or unit, greater than 50% of which was counseling/coordinating care for divertic abscess drain

## 2017-05-08 ENCOUNTER — Inpatient Hospital Stay (HOSPITAL_COMMUNITY): Payer: Medicaid Other

## 2017-05-08 DIAGNOSIS — R0689 Other abnormalities of breathing: Secondary | ICD-10-CM

## 2017-05-08 DIAGNOSIS — R0600 Dyspnea, unspecified: Secondary | ICD-10-CM

## 2017-05-08 DIAGNOSIS — Z0189 Encounter for other specified special examinations: Secondary | ICD-10-CM

## 2017-05-08 DIAGNOSIS — Z9289 Personal history of other medical treatment: Secondary | ICD-10-CM

## 2017-05-08 DIAGNOSIS — Z978 Presence of other specified devices: Secondary | ICD-10-CM

## 2017-05-08 DIAGNOSIS — J90 Pleural effusion, not elsewhere classified: Secondary | ICD-10-CM

## 2017-05-08 LAB — BODY FLUID CELL COUNT WITH DIFFERENTIAL
EOS FL: 0 %
EOS FL: 0 %
LYMPHS FL: 63 %
Lymphs, Fluid: 58 %
MONOCYTE-MACROPHAGE-SEROUS FLUID: 30 % — AB (ref 50–90)
MONOCYTE-MACROPHAGE-SEROUS FLUID: 23 % — AB (ref 50–90)
NEUTROPHIL FLUID: 14 % (ref 0–25)
Neutrophil Count, Fluid: 12 % (ref 0–25)
Other Cells, Fluid: 0 %
Other Cells, Fluid: 0 %
WBC FLUID: 142 uL (ref 0–1000)
WBC FLUID: 293 uL (ref 0–1000)

## 2017-05-08 LAB — PROTEIN, PLEURAL OR PERITONEAL FLUID
Total protein, fluid: 3.3 g/dL
Total protein, fluid: 3.1 g/dL

## 2017-05-08 LAB — GLUCOSE, CAPILLARY: GLUCOSE-CAPILLARY: 96 mg/dL (ref 65–99)

## 2017-05-08 LAB — PROTEIN, TOTAL: TOTAL PROTEIN: 6.3 g/dL — AB (ref 6.5–8.1)

## 2017-05-08 LAB — LACTATE DEHYDROGENASE, PLEURAL OR PERITONEAL FLUID
LD FL: 70 U/L — AB (ref 3–23)
LD, Fluid: 77 U/L — ABNORMAL HIGH (ref 3–23)

## 2017-05-08 LAB — LACTATE DEHYDROGENASE: LDH: 177 U/L (ref 98–192)

## 2017-05-08 MED ORDER — FLUCONAZOLE 100 MG PO TABS
100.0000 mg | ORAL_TABLET | Freq: Every day | ORAL | Status: AC
Start: 2017-05-08 — End: 2017-05-12
  Administered 2017-05-08 – 2017-05-12 (×5): 100 mg via ORAL
  Filled 2017-05-08 (×8): qty 1

## 2017-05-08 NOTE — Procedures (Signed)
[place] chest 1. Large nonlocuated rt effusion with atx 2. Moderate to large left effusion, free flowing, with atx  Lavon Paganini. Titus Mould, MD, [place] Pgr: [facility]

## 2017-05-08 NOTE — Procedures (Signed)
Thoracentesis Procedure Note- left with US guidance  Pre-operative Diagnosis: mod large effusion left   Post-operative Diagnosis: same  Indications: there, diag, pre op to avoid risk pulm peri op  Procedure Details  Consent: Informed consent was obtained. Risks of the procedure were discussed including: infection, bleeding, pain, pneumothorax.  Under sterile conditions the patient was positioned. Betadine solution and sterile drapes were utilized.  2% buffered lidocaine was used to anesthetize the 8th rib space. Fluid was obtained without any difficulties and minimal blood loss.  A dressing was applied to the wound and wound care instructions were provided.   Findings 850 ml of clear pleural fluid was obtained. A sample was sent to lab  Complications:  After I entered the chest I advanced the white catheter sheeth over the needle, but FROM THE FACTORY the sheeth was not screwed on to the needle hub (which I have never encountered prior 20 years in a kit) so when I advanced the sheeth attempt the needle also advanced about 4-5 cm , I recognized this and removed the needle and advanced the sheeth, I continued to get effusion and some air back with tidal appearing. It was not clear at this time, the hub also being loose may have allowed him to have air sucked into chest.. The air stopped after about 10 breaths and effusion at 850 cc. He complained of chest pain and SOB. Given the amount of air and symptoms. I moved him flat and peppered to do a pigtail catheter on left anterior chest wall 2nd space. I sterilized chest and entered on top rib 2 cm to 2.5 and never had any air. Also used [place] chest which was not diagnostic of ptx accept point sign concerns. Some possible lung sliding. I then paused and did not move forward to place wire give lack of air. Pcxr done, did not show PTX, sats 100%, RR 12 BP 137 sys, HR 101. Will move to sdu and observe and repeat pcxr. Also d/w radiology - no ptx noted       Condition: stable  Plan A follow up chest x-ray was ordered. Bed Rest for 5 hours. Tylenol 650 mg. for pain.  Attending Attestation: I performed the procedure.

## 2017-05-08 NOTE — Procedures (Signed)
Thoracentesis Procedure Note - right  Pre-operative Diagnosis: large effusion rt  Post-operative Diagnosis: same  Indications: there, diag pre op to reducde pulm risk peri op  Procedure Details  Consent: Informed consent was obtained. Risks of the procedure were discussed including: infection, bleeding, pain, pneumothorax.  Under sterile conditions the patient was positioned. Betadine solution and sterile drapes were utilized.  2% buffered lidocaine was used to anesthetize the 8th rib space. Fluid was obtained without any difficulties and minimal blood loss.  A dressing was applied to the wound and wound care instructions were provided.   Findings 1100 ml of clear pleural fluid was obtained. A sample was sent to Pathology for cytogenetics, flow, and cell counts, as well as for infection analysis.  Complications:  None; patient tolerated the procedure well.          Condition: stable  Plan A follow up chest x-ray was ordered. Bed Rest for 5 hours. Tylenol 650 mg. for pain.  Attending Attestation: I performed the procedure.    US guidance  Lavon Paganini. Titus Mould, MD, [place] Pgr: [facility]

## 2017-05-08 NOTE — Progress Notes (Signed)
Supervising Physician: Markus Daft  Patient Status:  [facility] - In-pt  Chief Complaint: Intra-abdominal abscess, loculated ascites  Subjective: Ready for flexiseal to be removed.  Feels like he is not progressing.   Allergies: Patient has no known allergies.  Medications: Prior to Admission medications   Not on File     Vital Signs: BP 140/85 (BP Location: Right Arm)   Pulse 94   Temp 98.1 F (36.7 C) (Oral)   Resp 18   Ht 5\' 11"  (1.803 m)   Wt 127 lb 13.9 oz (58 kg)   SpO2 97%   BMI 17.83 kg/m   Physical Exam  NAD, alert Abd: Distended, non-tender, RLQ drain in place with yellow, feculent output. 20 mL output recorded yesterday.   Imaging: Ct Abdomen Pelvis Wo Contrast  Result Date: 05/04/2017 CLINICAL DATA:  Diverticulitis post perforation and abscess, post drainage procedure, follow-up, question and colocutaneous fistula, mild leukocytosis EXAM: CT ABDOMEN AND PELVIS WITHOUT CONTRAST TECHNIQUE: Multidetector CT imaging of the abdomen and pelvis was performed following the standard protocol without IV contrast. Sagittal and coronal MPR images reconstructed from axial data set. Patient drank dilute oral contrast for exam. COMPARISON:  04/19/2017 FINDINGS: Lower chest: Moderate BILATERAL pleural effusions. Compressive atelectasis of the lower lobes with question consolidation in RIGHT lower lobe. Hepatobiliary: Gallbladder and liver normal appearance Pancreas: Normal appearance Spleen: Normal appearance Adrenals/Urinary Tract: Adrenal glands, kidneys, and ureters normal appearance. Bladder is incompletely distended. Bladder wall appears mildly thickened, question due to true wall thickening such as from prior pelvic inflammation or an artifact from underdistention Stomach/Bowel: Rectal tube with question mild rectal wall thickening. Sigmoid diverticulosis changes. Remainder of colon and small bowel loops unremarkable. Stomach decompressed. Vascular/Lymphatic: Atherosclerotic  calcifications aorta and iliac arteries without aneurysm. No definite adenopathy. Reproductive: Minimal prostatic enlargement. Seminal vesicles unremarkable. Other: Pigtail drainage catheter at site of prior abscess without residual associated fluid collection. Scattered free fluid with a focal area of prominent fluid in the lateral LEFT mid abdomen, question free fluid versus loculated, appears to be displacing bowel loops, collection of fluid at this site overall measuring approximately 13.3 x 9.7 x 16.6 cm. No definite free intraperitoneal air. No hernia. Musculoskeletal: Remarkable IMPRESSION: No residual fluid collection at site of pigtail drainage catheter in RIGHT pelvis. Moderate BILATERAL pleural effusions with compressive atelectasis of both lower lobes and question RIGHT lower lobe consolidation. Scattered ascites with question loculated versus 3 collection in the LEFT mid abdomen, appears to be the displacing bowel loops, overall 13.3 x 9.7 x 16.6 cm in size ; this could represent sterile or infected fluid and if an infected collection is suspected consider aspiration. Electronically Signed   By: Lavonia Dana M.D.   On: 05/04/2017 20:04   Dg Chest 2 View  Result Date: 05/08/2017 CLINICAL DATA:  Follow-up pneumonia EXAM: CHEST  2 VIEW COMPARISON:  05/04/2015 FINDINGS: Cardiac shadow is stable. Persistent right basilar infiltrate and effusion is noted. Improved aeration in the left base is noted. Persistent posterior fusion is noted on the left. Calcified granuloma in the left lung base is seen. No acute bony abnormality is noted. IMPRESSION: Bilateral pleural effusions and persistent right basilar infiltrate. Electronically Signed   By: Inez Catalina M.D.   On: 05/08/2017 07:16   Ir Paracentesis  Result Date: 05/05/2017 INDICATION: Patient with history of diverticulitis, now with large intra-abdominal fluid collection suspicious for loculated ascites. Is made for diagnostic and therapeutic  paracentesis. EXAM: ULTRASOUND GUIDED DIAGNOSTIC AND THERAPEUTIC PARACENTESIS MEDICATIONS:  10 mL 2% lidocaine COMPLICATIONS: None immediate. PROCEDURE: Informed written consent was obtained from the patient after a discussion of the risks, benefits and alternatives to treatment. A timeout was performed prior to the initiation of the procedure. Initial ultrasound scanning demonstrates a large amount of ascites within the left lateral abdomen. The left lateral abdomen was prepped and draped in the usual sterile fashion. 2% lidocaine was used for local anesthesia. Following this, a 19 gauge, 7-cm, Yueh catheter was introduced. An ultrasound image was saved for documentation purposes. The paracentesis was performed. The catheter was removed and a dressing was applied. The patient tolerated the procedure well without immediate post procedural complication. FINDINGS: A total of approximately 600 mL of clear, yellow fluid was removed. Samples were sent to the laboratory as requested by the clinical team. IMPRESSION: Successful ultrasound-guided diagnostic and therapeutic paracentesis yielding 600 mL of peritoneal fluid. Read by:  Brynda Greathouse PA-C Electronically Signed   By: Aletta Edouard M.D.   On: 05/05/2017 16:06    Labs:  CBC: Recent Labs    05/04/17 0216 05/05/17 0324 05/06/17 0828 05/07/17 0205  WBC 11.7* 12.5* 10.4 11.9*  HGB 9.3* 9.4* 9.2* 9.7*  HCT 28.8* 29.6* 28.6* 30.6*  PLT 438* 508* 491* 482*    COAGS: Recent Labs    04/13/17 0952  INR 1.24  APTT 35    BMP: Recent Labs    05/04/17 0216 05/05/17 0324 05/06/17 0828 05/07/17 0205  NA 137 135 137 138  K 2.9* 3.8 3.6 3.7  CL 98* 97* 101 100*  CO2 28 28 26 28   GLUCOSE 100* 99 94 108*  BUN 37* 35* 30* 31*  CALCIUM 8.0* 8.1* 8.2* 8.3*  CREATININE 2.36* 2.08* 1.87* 1.77*  GFRNONAA 30* 34* 39* 42*  GFRAA 34* 40* 45* 48*    LIVER FUNCTION TESTS: Recent Labs    04/27/17 0335 04/28/17 0209 04/29/17 0224 04/30/17 0418  05/04/17 0216  BILITOT 0.9 0.7 0.7  --  0.5  AST 17 19 16   --  13*  ALT 8* 9* 9*  --  6*  ALKPHOS 58 69 67  --  73  PROT 5.9* 6.5 6.6  --  6.4*  ALBUMIN 1.7* 1.8* 1.9* 1.8* 1.7*    Assessment and Plan: Intra-abdominal abscess s/p drain placement at [facility] on 04/03/17 Patient with continued feculent output of 20-30 mL/d. Currently to gravity bag and flushes as needed to keep drain patent. Continue current plan per medical/surgical team.  Encouraged activity once flexiseal removed.  IR to follow while drain remains in place.   Electronically Signed: Docia Barrier, PA 05/08/2017, 12:29 PM   I spent a total of 15 Minutes at the the patient's bedside AND on the patient's hospital floor or unit, greater than 50% of which was counseling/coordinating care for intra-abdominal abscess

## 2017-05-08 NOTE — Progress Notes (Signed)
Palliative Medicine RN Note:  Patient discussed in team rounds. Goals are clear, and there are no unmet palliative needs. PMT will sign off at this time. Please re-consult if new needs arise.  Marjie Skiff Stacie Templin, RN, BSN, [facility] 05/08/2017 8:48 AM Cell [phone] 8:00-4:00 Monday-Friday Office [phone]

## 2017-05-08 NOTE — Progress Notes (Signed)
Subjective: Alert.  Abdominal pain the same.  Tolerating diet. Not ambulating, I suspect because of flexes seal and condom catheter Continues with feculent drainage from RLQ drain.  Controlled cutaneous fistula obvious. C. difficile negative Cultures from loculated left-sided ascites negative so far Urine culture growing yeast  Earlier hospital course, complicated by DTs, acute respiratory  failure with hypoxia, ARDS, aspiration pneumonia and circulatory shock which required intubation and pressors.  Lungs are improving but has a little bit of air hunger when he ambulates in the hall and last chest x-ray on November 18 shows layering right effusion and right lower lobe atelectasis or pneumonia and mild interstitial edema.  CXR today    Objective: Vital signs in last 24 hours: Temp:  [98.2 F (36.8 C)-98.9 F (37.2 C)] 98.9 F (37.2 C) (11/22 2232) Pulse Rate:  [89-93] 93 (11/22 2232) Resp:  [18-20] 20 (11/22 2232) BP: (109-110)/(64-78) 110/64 (11/22 2232) SpO2:  [98 %-99 %] 98 % (11/22 2232) Weight:  [58 kg (127 lb 13.9 oz)] 58 kg (127 lb 13.9 oz) (11/23 0459) Last BM Date: 05/07/17  Intake/Output from previous day: 11/22 0701 - 11/23 0700 In: 89 [IV Piggyback:50] Out: 2170 [Urine:1900; Drains:20; Stool:250] Intake/Output this shift: Total I/O In: -  Out: 900 [Urine:900]    EXAM: General appearance:alert, cooperative and no distress Lungs: Decreased breath sounds bases.  No wheeze.  No rhonchi. SA:YTKZS is feculent, mild distension, ongoing discomfort  and tenderness  lower abdomen.  Relatively soft.He has a condom cath, and flexiseal in place     Lab Results:  Recent Labs    05/06/17 0828 05/07/17 0205  WBC 10.4 11.9*  HGB 9.2* 9.7*  HCT 28.6* 30.6*  PLT 491* 482*   BMET Recent Labs    05/06/17 0828 05/07/17 0205  NA 137 138  K 3.6 3.7  CL 101 100*  CO2 26 28  GLUCOSE 94 108*  BUN 30* 31*  CREATININE 1.87* 1.77*  CALCIUM 8.2* 8.3*    PT/INR No results for input(s): LABPROT, INR in the last 72 hours. ABG No results for input(s): PHART, HCO3 in the last 72 hours.  Invalid input(s): PCO2, PO2  Studies/Results: No results found.  Anti-infectives: Anti-infectives (From admission, onward)   Start     Dose/Rate Route Frequency Ordered Stop   05/04/17 0130  piperacillin-tazobactam (ZOSYN) IVPB 3.375 g     3.375 g 12.5 mL/hr over 240 Minutes Intravenous Every 8 hours 05/03/17 2211     04/28/17 1530  vancomycin (VANCOCIN) IVPB 750 mg/150 ml premix  Status:  Discontinued     750 mg 150 mL/hr over 60 Minutes Intravenous Every 24 hours 04/28/17 1424 05/01/17 1128   04/27/17 1400  piperacillin-tazobactam (ZOSYN) IVPB 3.375 g  Status:  Discontinued     3.375 g 12.5 mL/hr over 240 Minutes Intravenous Every 8 hours 04/27/17 1158 05/03/17 2211   04/26/17 1830  vancomycin (VANCOCIN) 1,250 mg in sodium chloride 0.9 % 250 mL IVPB  Status:  Discontinued     1,250 mg 166.7 mL/hr over 90 Minutes Intravenous Every 48 hours 04/26/17 1739 04/28/17 1424   04/24/17 1700  vancomycin (VANCOCIN) IVPB 1000 mg/200 mL premix  Status:  Discontinued     1,000 mg 200 mL/hr over 60 Minutes Intravenous  Once 04/24/17 1652 04/24/17 1656   04/24/17 1700  vancomycin (VANCOCIN) IVPB 1000 mg/200 mL premix  Status:  Discontinued     1,000 mg 200 mL/hr over 60 Minutes Intravenous Every 48 hours 04/24/17 1656 04/26/17 1739  04/23/17 1500  piperacillin-tazobactam (ZOSYN) IVPB 3.375 g  Status:  Discontinued     3.375 g 12.5 mL/hr over 240 Minutes Intravenous Every 12 hours 04/23/17 1107 04/27/17 1158   04/18/17 1800  piperacillin-tazobactam (ZOSYN) IVPB 3.375 g  Status:  Discontinued     3.375 g 12.5 mL/hr over 240 Minutes Intravenous Every 12 hours 04/18/17 1032 04/23/17 1107   04/18/17 1400  piperacillin-tazobactam (ZOSYN) IVPB 3.375 g  Status:  Discontinued     3.375 g 12.5 mL/hr over 240 Minutes Intravenous Every 8 hours 04/18/17 1031 04/18/17  1032   04/17/17 1200  piperacillin-tazobactam (ZOSYN) IVPB 3.375 g  Status:  Discontinued     3.375 g 100 mL/hr over 30 Minutes Intravenous Every 6 hours 04/17/17 1030 04/18/17 1031   04/17/17 1100  piperacillin-tazobactam (ZOSYN) IVPB 3.375 g  Status:  Discontinued     3.375 g 12.5 mL/hr over 240 Minutes Intravenous Every 8 hours 04/17/17 1019 04/17/17 1030   04/15/17 2125  Ampicillin-Sulbactam (UNASYN) 3 g in sodium chloride 0.9 % 100 mL IVPB  Status:  Discontinued     3 g 200 mL/hr over 30 Minutes Intravenous Every 8 hours 04/15/17 1529 04/17/17 1019   04/15/17 1100  ampicillin-sulbactam (UNASYN) 1.5 g in sodium chloride 0.9 % 50 mL IVPB  Status:  Discontinued     1.5 g 100 mL/hr over 30 Minutes Intravenous Every 12 hours 04/15/17 0937 04/15/17 1529   04/13/17 2200  levofloxacin (LEVAQUIN) IVPB 500 mg  Status:  Discontinued     500 mg 100 mL/hr over 60 Minutes Intravenous Every 48 hours 04/12/17 0114 04/12/17 0846   04/13/17 1800  piperacillin-tazobactam (ZOSYN) IVPB 2.25 g  Status:  Discontinued     2.25 g 100 mL/hr over 30 Minutes Intravenous Every 8 hours 04/13/17 1050 04/15/17 0937   04/12/17 0200  piperacillin-tazobactam (ZOSYN) IVPB 3.375 g  Status:  Discontinued     3.375 g 12.5 mL/hr over 240 Minutes Intravenous Every 8 hours 04/12/17 0103 04/13/17 1050   04/12/17 0000  levofloxacin (LEVAQUIN) IVPB 750 mg  Status:  Discontinued     750 mg 100 mL/hr over 90 Minutes Intravenous Every 48 hours 04/11/17 2230 04/12/17 0114      Assessment/Plan:  Acute diverticulitis with perforation. [facility]  04/02/17 diverticulitis with drain placement - feculent drainage Transferred due to respiratory failure   Basically he will ultimately need a colon resection.  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  This is not emergent, however is probably in his best interest. The only thing  standing in the way of surgical intervention is his pulmonary status. He is deconditioned but stable  We will need internal medicine or pulmonary medicine to advise [place] once his pulmonary problems are optimized or resolved and he is acceptable candidate for general anesthesia.  At that point we will consider scheduling him for surgery  Loculated left-sided ascites.  600 mL aspirated.  Cultures negative  Possible yeast UTI - will give short course of Diflucan  [facility] 04/11/17 - ARDS/pneumonia/COPD Bilateral pleura effusions DT"s - Heavy ETOH use preadmit Hx of tobacco use Anemia Chronic kidney disease - creatinine 2.08 FEN: carb mod/hearthealthy WE:XHBZ stopped 11/16, IV zosyn (11/2=>> day 19).. C. difficile negative.   DVT: Heparin Foley: - condom cath and flexiseal.. We will remove these and see if he can do without this should help mobility  Plan:  continue PT.  Antibiotics.  Oral  nutrition               He needs to relate more.  PT involved.  We'll remove flexes see a lab condom cath to see if we can get him a more               Chest x-ray today             Need input from medicine regarding status of pulmonary function and whether he is except we'll candidate for general anesthesia from a pulmonary standpoint .                May need CT chest to be sure that effusions are not loculated             Onprobiotics.       LOS: 27 days    Adin Hector 05/08/2017

## 2017-05-08 NOTE — Progress Notes (Signed)
PROGRESS NOTE    Dominic Phillips  KYH:[id number] DOB: 03-28-1961 DOA: 04/11/2017 PCP: No primary care provider on file.  Brief Narrative: Assessment & Pla82 year old male with PMH of alcohol abuse presented to [facility] on 04/02/17 with complaints of abdominal pain without fevers, leukocytosis or diarrhea. CT abdomen revealed pericolonic inflammation/fluid collection and he was admitted for diverticular abscess. General surgery was consulted who suggested placement of percutaneous drain by IR. Cultures grew Escherichia coli. Hospital course complicated by DTs, acute respiratory failure with hypoxia/ARDS/aspiration pneumonia and circulatory shock for which he required intubation and pressors, acute kidney injury for which she required CRRT and IHD 1. Shock resolved. Renal functions improving. General surgery continue to follow. Nephrology signed off.  Patient seen and examined at his bedside. Continues to have flexiceal and percutaneous drainage. Admits to intermittent pain dull  5/10, at its worse 7/10 improved with dilaudid. Admits to intermittent nausea with no vomiting.    Active Problems:   Acute respiratory distress syndrome (ARDS) (HCC)   Acute kidney injury ([place])   Pressure injury of skin   Colonic diverticular abscess   COPD (chronic obstructive pulmonary disease) (HCC)   Dyspnea and respiratory abnormalities   HCAP (healthcare-associated pneumonia)   Acute blood loss anemia   Anemia of chronic disease   ETOH abuse   Tobacco abuse   Tachycardia   Tachypnea   Leukocytosis   Post-operative pain   Acute respiratory failure with hypoxia (HCC)   Palliative care by specialist   Acute respiratory failure with hypoxia/ARDS/VDRF - multifactorial: 2/2 Pulmonary edema vs pleural effusions vs atelectasis vs pneumonia. -weaned down from 15 L/m HFNC oxygen to 3 L/m. Continue weaning  oxygen as tolerated to keep saturations >90%, cotninue incentive spirometry, increase  mobilization and activity.  -Required intubation from 10/27 to 11/2 -continue intermittent diuresis as tolerated -Follow CXR intermittently; pulmonary service will see patient. -s/p thoracentesis (1.3L removed; transudate) -patient on zosyn  Hypokalemia-- -continue monitoring electrolytes and replete as needed   Diverticulitis with perforation and abscess, S/P IR drain - Gen. 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 - Continue IV antibiotics - REPEAT CT 05/04/17-No residual fluid collection at site of pigtail drainage catheter in right pelvis. - patient with ongoing fistula; per surgery rec's will need sigmoidectomy and colostomy. -will follow pulmonary and general surgery rec's. -plan is to remove flexiseal today.  Moderate BILATERAL pleural effusions  -with compressive atelectasis of both lower lobes and question right lower lobe consolidation. -patient afebrile and with stable breathing -continue zosyn  -repeat CXR with improved ariation, still demonstrating right lower lobe airspace changes and bilateral effusions. -I have discussed with pulmonary service and they will see patient to help determine further stability/tune up needs in anticipation for surgery.  Acute kidney injury status post CRRT 10/27 and intermittent dialysis. -Renal function improved and stable -renal service sign off -follow intermittent BMET to follow Cr trend  -maintain adequate hydration  -HD catheter removed on 11/10  Anemia of chronic disease.  -Continue iron replacement.  -Status post 1 unit of blood transfusion.  -follow Hgb trend intermittently -no signs of acute bleeding appreciated -last hemoglobin level 9.7  Moderate malnutrition with chronic illness and hx of alcohol abuse- -appreciated nutritional service input -Continue feeding supplements.   DVT prophylaxis.  Heparin Code Status: Partial Family  Communication: no family at bedside  Disposition Plan: Plan is for him to go to inpatient rehab once he is medically stable. CCS anticipating sigmoidectomy and  colostomy once fully recover from pulmonary stand point.  Consultants:  General surgery IR PCCM  Procedures: IR drain for acute diverticulitis with perforation.  Antimicrobials: Zosyn  Vitals:   05/07/17 1503 05/07/17 2232 05/08/17 0459 05/08/17 0610  BP: 109/78 110/64  140/85  Pulse: 89 93  94  Resp: 18 20  18   Temp: 98.2 F (36.8 C) 98.9 F (37.2 C)  98.1 F (36.7 C)  TempSrc: Oral Oral  Oral  SpO2: 99% 98%  97%  Weight:   58 kg (127 lb 13.9 oz)   Height:        Intake/Output Summary (Last 24 hours) at 05/08/2017 1157 Last data filed at 05/08/2017 0503 Gross per 24 hour  Intake 50 ml  Output 1920 ml  Net -1870 ml   Filed Weights   05/06/17 0500 05/07/17 0500 05/08/17 0459  Weight: 59.4 kg (130 lb 15.3 oz) 59.4 kg (130 lb 15.3 oz) 58 kg (127 lb 13.9 oz)    Examination: General exam: Fever, in no acute distress, patient frail and underweight; denies chest pain reports breathing to be stable.  Currently wearing 2 L nasal cannula with good oxygen saturation.  He feels weak and deconditioned.  But is in good spirit and will like to do everything that needs to be done to get better. Respiratory system: Good air movement bilaterally, no wheezing, no frank crackles; decreased breath sounds at the bases.  Cardiovascular system: No JVD, no murmurs, no rubs, no gallops, RRR.   Gastrointestinal system: Soft, mild distention, some tenderness on his left lower quadrant positive bowel sounds.  Patient with percutaneous drain in place which demonstrate yellow feculent discharge.   Central nervous system: Oriented x3, able to follow commands appropriately, demonstrate to have good insight and is moving 4 limbs spontaneously.  No focal deficit appreciated.  Oriented X 3, following commands appropriately, CN intact. Extremities: No  edema, no cyanosis, no clubbing. Skin: patient with drain in place (having yellow feculent discharge); also with flexiseal  in place.   Data Reviewed: I have personally reviewed following labs and imaging studies  CBC: Recent Labs  Lab 05/03/17 0319 05/04/17 0216 05/05/17 0324 05/06/17 0828 05/07/17 0205  WBC 15.2* 11.7* 12.5* 10.4 11.9*  NEUTROABS  --  8.0* 8.3*  --   --   HGB 9.8* 9.3* 9.4* 9.2* 9.7*  HCT 30.1* 28.8* 29.6* 28.6* 30.6*  MCV 93.2 93.5 94.3 95.3 95.3  PLT 505* 438* 508* 491* 237*   Basic Metabolic Panel: Recent Labs  Lab 05/02/17 0238 05/03/17 1311 05/04/17 0216 05/05/17 0324 05/06/17 0828 05/07/17 0205  NA 136  --  137 135 137 138  K 3.3*  --  2.9* 3.8 3.6 3.7  CL 97*  --  98* 97* 101 100*  CO2 25  --  28 28 26 28   GLUCOSE 102*  --  100* 99 94 108*  BUN 34*  --  37* 35* 30* 31*  CREATININE 2.55*  --  2.36* 2.08* 1.87* 1.77*  CALCIUM 8.2*  --  8.0* 8.1* 8.2* 8.3*  MG  --  1.6*  --   --   --   --    GFR: Estimated Creatinine Clearance: 39.1 mL/min (A) (by C-G formula based on SCr of 1.77 mg/dL (H)).   Liver Function Tests: Recent Labs  Lab 05/04/17 0216  AST 13*  ALT 6*  ALKPHOS 73  BILITOT 0.5  PROT 6.4*  ALBUMIN 1.7*   CBG: Recent Labs  Lab 05/03/17 2141 05/08/17 6283  GLUCAP 91 96    Recent Results (from the past 240 hour(s))  Culture, body fluid-bottle     Status: None (Preliminary result)   Collection Time: 05/05/17  2:49 PM  Result Value Ref Range Status   Specimen Description FLUID PERITONEAL  Final   Special Requests BOTTLES DRAWN AEROBIC AND ANAEROBIC  Final   Culture NO GROWTH 2 DAYS  Final   Report Status PENDING  Incomplete  Gram stain     Status: None   Collection Time: 05/05/17  2:49 PM  Result Value Ref Range Status   Specimen Description FLUID PERITONEAL  Final   Special Requests NONE  Final   Gram Stain   Final    CYTOSPIN SMEAR WBC PRESENT,BOTH PMN AND MONONUCLEAR NO ORGANISMS SEEN    Report Status  05/05/2017 FINAL  Final  Urine Culture     Status: Abnormal   Collection Time: 05/06/17  9:18 AM  Result Value Ref Range Status   Specimen Description URINE, RANDOM  Final   Special Requests NONE  Final   Culture 20,000 COLONIES/mL YEAST (A)  Final   Report Status 05/07/2017 FINAL  Final  C difficile quick scan w PCR reflex     Status: None   Collection Time: 05/06/17  9:18 AM  Result Value Ref Range Status   C Diff antigen NEGATIVE NEGATIVE Final   C Diff toxin NEGATIVE NEGATIVE Final   C Diff interpretation No C. difficile detected.  Final     Radiology Studies: Dg Chest 2 View  Result Date: 05/08/2017 CLINICAL DATA:  Follow-up pneumonia EXAM: CHEST  2 VIEW COMPARISON:  05/04/2015 FINDINGS: Cardiac shadow is stable. Persistent right basilar infiltrate and effusion is noted. Improved aeration in the left base is noted. Persistent posterior fusion is noted on the left. Calcified granuloma in the left lung base is seen. No acute bony abnormality is noted. IMPRESSION: Bilateral pleural effusions and persistent right basilar infiltrate. Electronically Signed   By: Inez Catalina M.D.   On: 05/08/2017 07:16     Scheduled Meds: . feeding supplement (ENSURE ENLIVE)  237 mL Oral QID  . feeding supplement (PRO-STAT SUGAR FREE 64)  30 mL Oral BID  . fluconazole  100 mg Oral Daily  . folic acid  1 mg Oral QHS  . guaiFENesin  1,200 mg Oral BID  . heparin subcutaneous  5,000 Units Subcutaneous Q8H  . mouth rinse  15 mL Mouth Rinse BID  . multivitamin with minerals  1 tablet Oral Daily  . pantoprazole  40 mg Oral QHS  . saccharomyces boulardii  250 mg Oral BID  . thiamine  100 mg Oral QHS  . traZODone  50 mg Oral QHS   Continuous Infusions: . sodium chloride    . piperacillin-tazobactam (ZOSYN)  IV 3.375 g (05/08/17 0912)     LOS: 34 days    Barton Dubois, MD [facility] Pager [phone]  If 7PM-7AM, please contact night-coverage [email or web address] Password [facility] 05/08/2017,  11:57 AM

## 2017-05-08 NOTE — Progress Notes (Signed)
Name: Dominic Phillips MRN: [id number] DOB: September 13, 1960    ADMISSION DATE:  04/11/2017 CONSULTATION DATE:  11/23  REFERRING MD :  Dr. Dyann Kief  CHIEF COMPLAINT:  Effusion, pre-op eval  HISTORY OF PRESENT ILLNESS:  56 year old male with PMH etoh abuse and liver abscess, SBO on TPN. He was admitted for diverticular abscess treated with ABX and IR placed drain. Course complicated by ARDS and septic shock. Cultures from drain grew E. Coli. Developed kidney injury requiring CRRT. He improved and was taken off vent, pressors, RRT. Transferred out of ICU 11/3. 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 PCCM consulted for pre-op pulmonary clearance/optimization.   SIGNIFICANT EVENTS  10/18 admit Dominic Phillips with diverticular abscess 10/23 tx to [facility] for sepsis, ARDS, shock.  11/3 off pressors, vent, crrt out of unit 11/8 R thora - transudate by protein  11/23 for OR soon. R effusion. PCCM called back.   STUDIES:  11/19 CT abd > No residual fluid collection at site of pigtail drainage catheter in RIGHT pelvis. Moderate BILATERAL pleural effusions with compressive atelectasis of both lower lobes and question RIGHT lower lobe consolidation. Scattered ascites with question loculated versus 3 collection in the LEFT mid abdomen, appears to be the displacing bowel loops, overall 13.3 x 9.7 x 16.6 cm in size ; this could represent sterile or infected fluid and if an infected collection is suspected consider aspiration.  SUBJECTIVE: no complaints  VITAL SIGNS: Temp:  [98.1 F (36.7 C)-98.9 F (37.2 C)] 98.1 F (36.7 C) (11/23 0610) Pulse Rate:  [89-94] 94 (11/23 0610) Resp:  [18-20] 18 (11/23 0610) BP: (109-140)/(64-85) 140/85 (11/23 0610) SpO2:  [97 %-99 %] 97 % (11/23 0610) Weight:  [58 kg (127 lb 13.9 oz)] 58 kg (127 lb 13.9 oz) (11/23 0459)  PHYSICAL EXAMINATION: General:  Cachectic male in NAD. Appears older than stated age.  Neuro:   Alert, oriented, non-focal HEENT:  [place]/AT, PERRL, JVD up, wearing eyeglasses Cardiovascular:  RRR, no MRG Lungs:  Coarse/diminished R base Abdomen: soft, non-tender, non-distended Musculoskeletal:  No acute deformity or ROM limitation Skin:  Grossly intact.   Recent Labs  Lab 05/05/17 0324 05/06/17 0828 05/07/17 0205  NA 135 137 138  K 3.8 3.6 3.7  CL 97* 101 100*  CO2 28 26 28   BUN 35* 30* 31*  CREATININE 2.08* 1.87* 1.77*  GLUCOSE 99 94 108*   Recent Labs  Lab 05/05/17 0324 05/06/17 0828 05/07/17 0205  HGB 9.4* 9.2* 9.7*  HCT 29.6* 28.6* 30.6*  WBC 12.5* 10.4 11.9*  PLT 508* 491* 482*   Dg Chest 2 View  Result Date: 05/08/2017 CLINICAL DATA:  Follow-up pneumonia EXAM: CHEST  2 VIEW COMPARISON:  05/04/2015 FINDINGS: Cardiac shadow is stable. Persistent right basilar infiltrate and effusion is noted. Improved aeration in the left base is noted. Persistent posterior fusion is noted on the left. Calcified granuloma in the left lung base is seen. No acute bony abnormality is noted. IMPRESSION: Bilateral pleural effusions and persistent right basilar infiltrate. Electronically Signed   By: Inez Catalina M.D.   On: 05/08/2017 07:16    ASSESSMENT / PLAN:  R sided pleural effusion, recurrent. Has had thora done 11/8 and looked to be transudate by Lights criteria. Effusion has since reoccurred. Will likely need repeat thoracentesis to optimize pulmonary status for OR as his renal function is such that there is some risk for further injury with diuresis.  Plan: - Supplemental O2 as needed  to keep Sats > 92% (current 3L) - Will evaluate R chest with [place] to better characterize infiltrate, will plans for thoracentesis if this is truly effusion.  - Diurese as tolerated - Clear for abdominal surgery from pulmonary standpoint. He has no known chronic pulmonary disease. Small-mod effusion should be improved with thora today.   Georgann Housekeeper, AGACNP-BC [facility] Pager [phone] or [phone]  05/08/2017 1:19 PM   STAFF NOTE: Linwood Dibbles, MD FACP have personally reviewed patient's available data, including medical history, events of note, physical examination and test results as part of my evaluation. I have discussed with resident/NP and other care providers such as pharmacist, RN and RRT. In addition, I personally evaluated patient and elicited key findings of: awake, mild increase in rr, jvd present, reduced BS bases rt greater left, abdo with drain, soft, no edema, low muscle mass on ext, pcxr which I reviewed showed large effusion rt, and likely mod on left with layering when flat, recent CT I reviewed showed large effusion rt and mod left with compressive atx rt greater left, he also has some renal insuff and I am concerned that lasix will not resolve these effusions, will plan to assess [place] chest and thora bilateral if indicated to avoid peri operative complication / atx/ pna and continued hypoxia, diuresis as able. I udpate the pt in full, all risks and benefits explained including bleeding PTX, infection   Lavon Paganini. Titus Mould, MD, [place] Pgr: [facility] 05/08/2017 3:33 PM

## 2017-05-09 ENCOUNTER — Inpatient Hospital Stay (HOSPITAL_COMMUNITY): Payer: Medicaid Other

## 2017-05-09 DIAGNOSIS — Z0000 Encounter for general adult medical examination without abnormal findings: Secondary | ICD-10-CM

## 2017-05-09 DIAGNOSIS — Z9289 Personal history of other medical treatment: Secondary | ICD-10-CM

## 2017-05-09 DIAGNOSIS — R0602 Shortness of breath: Secondary | ICD-10-CM

## 2017-05-09 LAB — BASIC METABOLIC PANEL
Anion gap: 12 (ref 5–15)
BUN: 29 mg/dL — AB (ref 6–20)
CHLORIDE: 97 mmol/L — AB (ref 101–111)
CO2: 28 mmol/L (ref 22–32)
Calcium: 8.3 mg/dL — ABNORMAL LOW (ref 8.9–10.3)
Creatinine, Ser: 1.41 mg/dL — ABNORMAL HIGH (ref 0.61–1.24)
GFR calc Af Amer: >60 mL/min (ref >60)
GFR calc non Af Amer: 55 mL/min — ABNORMAL LOW (ref >60)
Glucose, Bld: 104 mg/dL — ABNORMAL HIGH (ref 65–99)
POTASSIUM: 2.8 mmol/L — AB (ref 3.5–5.1)
SODIUM: 137 mmol/L (ref 135–145)

## 2017-05-09 LAB — GRAM STAIN

## 2017-05-09 LAB — GLUCOSE, CAPILLARY: Glucose-Capillary: 97 mg/dL (ref 65–99)

## 2017-05-09 LAB — MAGNESIUM: Magnesium: 1.8 mg/dL (ref 1.7–2.4)

## 2017-05-09 MED ORDER — POTASSIUM CHLORIDE CRYS ER 20 MEQ PO TBCR
40.0000 meq | EXTENDED_RELEASE_TABLET | ORAL | Status: AC
  Administered 2017-05-09 (×3): 40 meq via ORAL
  Filled 2017-05-09 (×3): qty 2

## 2017-05-09 MED ORDER — MAGNESIUM SULFATE 2 GM/50ML IV SOLN
2.0000 g | Freq: Once | INTRAVENOUS | Status: AC
  Administered 2017-05-09: 2 g via INTRAVENOUS
  Filled 2017-05-09: qty 50

## 2017-05-09 NOTE — Progress Notes (Signed)
Name: Dominic Phillips MRN: [id number] DOB: 1961/05/12    ADMISSION DATE:  04/11/2017 CONSULTATION DATE:  11/23  REFERRING MD :  Dr. Dyann Kief  CHIEF COMPLAINT:  Effusion, pre-op eval  HISTORY OF PRESENT ILLNESS:  56 year old male with PMH etoh abuse and liver abscess, SBO on TPN. He was admitted for diverticular abscess treated with ABX and IR placed drain. Course complicated by ARDS and septic shock. Cultures from drain grew E. Coli. Developed kidney injury requiring CRRT. He improved and was taken off vent, pressors, RRT. Transferred out of ICU 11/3. 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 PCCM consulted for pre-op pulmonary clearance/optimization.   SIGNIFICANT EVENTS  10/18 admit [place] with diverticular abscess 10/23 tx [facility] for sepsis, ARDS, shock.  11/3 off pressors, vent, crrt out of unit 11/8 R thora - transudate by protein  11/23 for OR soon. R effusion. PCCM called back. \ 05/08/2017 right and left thoracentesis performed per Dr. Titus Mould.  STUDIES:  11/19 CT abd > No residual fluid collection at site of pigtail drainage catheter in RIGHT pelvis. Moderate BILATERAL pleural effusions with compressive atelectasis of both lower lobes and question RIGHT lower lobe consolidation. Scattered ascites with question loculated versus 3 collection in the LEFT mid abdomen, appears to be the displacing bowel loops, overall 13.3 x 9.7 x 16.6 cm in size ; this could represent sterile or infected fluid and if an infected collection is suspected consider aspiration.  SUBJECTIVE: Complains of some right-sided chest pain.  Reports breathing better.  VITAL SIGNS: Temp:  [98 F (36.7 C)-98.1 F (36.7 C)] 98 F (36.7 C) (11/24 0602) Pulse Rate:  [98-101] 98 (11/24 0602) Resp:  [20] 20 (11/24 0602) BP: (113-120)/(72-90) 120/90 (11/24 0602) SpO2:  [96 %-100 %] 100 % (11/24 0602) Weight:  [129 lb 13.6 oz (58.9 kg)] 129 lb 13.6 oz  (58.9 kg) (11/24 0500)  PHYSICAL EXAMINATION: General: Frail ill-appearing male in no acute distress.  Complains of right chest discomfort. HEENT: No JVD or lymphadenopathy appreciated PSY: Strange affect Neuro: Intact moves all extremities to commands CV: Sounds are regular regular rate and rhythm PULM: Equal breath sounds bilaterally ZH:GDJM, non-tender, bsx4 active  Extremities: warm/dry, mild edema  Skin: no rashes or lesions   Recent Labs  Lab 05/06/17 0828 05/07/17 0205 05/09/17 0334  NA 137 138 137  K 3.6 3.7 2.8*  CL 101 100* 97*  CO2 26 28 28   BUN 30* 31* 29*  CREATININE 1.87* 1.77* 1.41*  GLUCOSE 94 108* 104*   Recent Labs  Lab 05/05/17 0324 05/06/17 0828 05/07/17 0205  HGB 9.4* 9.2* 9.7*  HCT 29.6* 28.6* 30.6*  WBC 12.5* 10.4 11.9*  PLT 508* 491* 482*  Pleural fluid left Albumin less than 1 Glucose 89 LD fluid 77 Total protein 3.1 PH 7.6 Color straw-colored WBCs to 93 Dg Chest 2 View  Result Date: 05/08/2017 CLINICAL DATA:  Follow-up pneumonia EXAM: CHEST  2 VIEW COMPARISON:  05/04/2015 FINDINGS: Cardiac shadow is stable. Persistent right basilar infiltrate and effusion is noted. Improved aeration in the left base is noted. Persistent posterior fusion is noted on the left. Calcified granuloma in the left lung base is seen. No acute bony abnormality is noted. IMPRESSION: Bilateral pleural effusions and persistent right basilar infiltrate. Electronically Signed   By: Inez Catalina M.D.   On: 05/08/2017 07:16   Dg Chest Port 1 View  Result Date: 05/09/2017 CLINICAL DATA:  Bilateral thoracentesis on 05/08/2017.  Evaluate for pleural effusion. EXAM: PORTABLE CHEST 1 VIEW COMPARISON:  05/08/2017 FINDINGS: No definite pneumothorax on this examination. Increased densities in the right lower chest compared to the most recent examination. Heart and mediastinum are within normal limits and stable. The trachea is midline. Calcified granuloma at the left lung base.  IMPRESSION: Increased densities in the right lower chest. Findings could represent increasing airspace disease and/or pleural fluid. No pneumothorax. Electronically Signed   By: Markus Daft M.D.   On: 05/09/2017 10:09   Dg Chest Port 1 View  Result Date: 05/08/2017 CLINICAL DATA:  Pleural effusion. Post bilateral thoracentesis earlier this day. EXAM: PORTABLE CHEST 1 VIEW COMPARISON:  Most recent comparison earlier this day at 1500 hour, nearly 4 hours prior. FINDINGS: Unchanged heart size and mediastinal contours. Tiny left apicolateral pneumothorax, less than 5%, not seen on prior exam. No right pneumothorax. Increased patchy and interstitial opacities in both lung bases, right greater than left, may be atelectasis or re-expansion pulmonary edema post thoracentesis. No definite pleural fluid seen on single upright view. Calcified granuloma at the left lung base. IMPRESSION: Tiny left apical and lateral pneumothorax, less than 5%, not seen on exam earlier this day. Increasing patchy and interstitial bibasilar opacities, right greater than left, may be atelectasis or re-expansion pulmonary edema post thoracentesis ease today. These results were called by telephone at the time of interpretation on 05/08/2017 at 7:32 pm to Dr. Jani Gravel , who verbally acknowledged these results. Electronically Signed   By: Jeb Levering M.D.   On: 05/08/2017 19:32   Dg Chest Port 1 View  Result Date: 05/08/2017 CLINICAL DATA:  Status post left thoracentesis EXAM: PORTABLE CHEST 1 VIEW COMPARISON:  05/08/2017 at 1413 hours FINDINGS: No pneumothorax is seen status post left thoracentesis. Mild right basilar atelectasis/scarring. Suspected trace right pleural effusion. Calcified granuloma at the lateral left lung base. The heart is normal in size. IMPRESSION: No pneumothorax is seen status post left thoracentesis. These results were called by telephone at the time of interpretation on 05/08/2017 at 3:15 pm to Dr. Heber [place], who  verbally acknowledged these results. Electronically Signed   By: Julian Hy M.D.   On: 05/08/2017 15:15   Dg Chest Port 1 View  Result Date: 05/08/2017 CLINICAL DATA:  Right-sided thoracentesis. EXAM: PORTABLE CHEST 1 VIEW COMPARISON:  05/08/2017 at 0655 hours. FINDINGS: Marked decrease in right-sided pleural effusion since thoracentesis with trace fluid blunting the right lateral costophrenic angle. No pneumothorax. Heart is top-normal in size. No aortic aneurysm. Stable granuloma at the left lung base. There is platelike atelectasis in the right lower lobe. No acute osseous abnormality. IMPRESSION: Near complete resolution of right-sided pleural effusion status post thoracentesis. Atelectasis is noted at the right lung base. No pneumothorax is visualized Electronically Signed   By: Ashley Royalty M.D.   On: 05/08/2017 14:40    ASSESSMENT / PLAN:  R sided pleural effusion, recurrent. Has had thora done 11/8 and looked to be transudate by Lights criteria. Effusion has since reoccurred. Will likely need repeat thoracentesis to optimize pulmonary status for OR as his renal function is such that there is some risk for further injury with diuresis.  Plan: .  05/08/2017 thoracentesis right cc of clear pleural fluid.  Follow-up chest x-ray does not demonstrate pneumothorax or reaccumulation of pleural fluid. -05/08/2017 left thoracentesis performed with 850 cc of pleural fluid obtained.  Follow-up chest x-ray does not demonstrate any pneumothorax. -Continue to follow pleural fluid labs. - Diurese as tolerated - Clear  for abdominal surgery from pulmonary standpoint. He has no known chronic pulmonary disease. Small-mod effusion should be improved with thora today.   Richardson Landry Minor ACNP [facility] P[facility]M Pager [phone] till 1 pm If no answer page [phone] 05/09/2017, 12:10 PM   STAFF NOTE: I, Merrie Roof, MD FACP have personally reviewed patient's available data, including medical history,  events of note, physical examination and test results as part of my evaluation. I have discussed with resident/NP and other care providers such as pharmacist, RN and RRT. In addition, I personally evaluated patient and elicited key findings of: awake, alert, no distress, JVD wnl, no crepitus, lungclear slight ronchi rt base, abdo soft with drain, no edema,  Effusions drained without overt complication, NO ptx on todays pcxr which I reviewed and has new Rt base ATX vs effusion increase on rt, left remains clear, continued to keep him neg with balance as tolerated, replace K, looks like transudates related to prior ARDS and poor nutrition status, should use IS and repeat pcxr, would not have much else of concern for pulm clearance with no prior history lung dz, avoidance of compressive atx was issue, can stay on floor on tele, pulse ox Lavon Paganini. Titus Mould, MD, [place] Pgr: [facility] 05/09/2017 1:09 PM

## 2017-05-09 NOTE — Progress Notes (Signed)
Subjective: Condition stable.  He is alert.  Reasonably comfortable. Trying to ambulate more Continues with feculent drainage from RLQ drain.  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 Started on Diflucan for yeast UTI yesterday  Bilateral thoracentesis by Dr. Titus Mould revealed 800 mL on the left and 1100 mL on the right.  Follow-up chest x-ray looks much better with tiny pneumothorax.  Follow-up chest x-ray today.  He doesn't have any increased work of breathing and feels a little better from a pulmonary standpoint  He knows that we are still heading toward [facility] resection next week and that the decision on date and timing will probably be finalized on Monday.  Objective: Vital signs in last 24 hours: Temp:  [98 F (36.7 C)-98.1 F (36.7 C)] 98 F (36.7 C) (11/24 0602) Pulse Rate:  [98-101] 98 (11/24 0602) Resp:  [20] 20 (11/24 0602) BP: (113-120)/(72-90) 120/90 (11/24 0602) SpO2:  [96 %-100 %] 100 % (11/24 0602) Weight:  [58.9 kg (129 lb 13.6 oz)] 58.9 kg (129 lb 13.6 oz) (11/24 0500) Last BM Date: 05/08/17  Intake/Output from previous day: 11/23 0701 - 11/24 0700 In: 36 [IV Piggyback:50] Out: 581 [Urine:580; Stool:1] Intake/Output this shift: Total I/O In: -  Out: 580 [Urine:580]   EXAM: General appearance:alert, cooperative and no distress Lungs: Decreased breath sounds bases.  No wheeze.  No rhonchi. HE:NIDPO is feculent, mild distension, ongoing discomfort  and tenderness  lower abdomen, seems less  Relatively soft     Lab Results:  Recent Labs    05/06/17 0828 05/07/17 0205  WBC 10.4 11.9*  HGB 9.2* 9.7*  HCT 28.6* 30.6*  PLT 491* 482*   BMET Recent Labs    05/07/17 0205 05/09/17 0334  NA 138 137  K 3.7 2.8*  CL 100* 97*  CO2 28 28  GLUCOSE 108* 104*  BUN 31* 29*  CREATININE 1.77* 1.41*  CALCIUM 8.3* 8.3*   PT/INR No results for input(s): LABPROT, INR in the last 72 hours. ABG No results for input(s): PHART,  HCO3 in the last 72 hours.  Invalid input(s): PCO2, PO2  Studies/Results: Dg Chest 2 View  Result Date: 05/08/2017 CLINICAL DATA:  Follow-up pneumonia EXAM: CHEST  2 VIEW COMPARISON:  05/04/2015 FINDINGS: Cardiac shadow is stable. Persistent right basilar infiltrate and effusion is noted. Improved aeration in the left base is noted. Persistent posterior fusion is noted on the left. Calcified granuloma in the left lung base is seen. No acute bony abnormality is noted. IMPRESSION: Bilateral pleural effusions and persistent right basilar infiltrate. Electronically Signed   By: Inez Catalina M.D.   On: 05/08/2017 07:16   Dg Chest Port 1 View  Result Date: 05/08/2017 CLINICAL DATA:  Pleural effusion. Post bilateral thoracentesis earlier this day. EXAM: PORTABLE CHEST 1 VIEW COMPARISON:  Most recent comparison earlier this day at 1500 hour, nearly 4 hours prior. FINDINGS: Unchanged heart size and mediastinal contours. Tiny left apicolateral pneumothorax, less than 5%, not seen on prior exam. No right pneumothorax. Increased patchy and interstitial opacities in both lung bases, right greater than left, may be atelectasis or re-expansion pulmonary edema post thoracentesis. No definite pleural fluid seen on single upright view. Calcified granuloma at the left lung base. IMPRESSION: Tiny left apical and lateral pneumothorax, less than 5%, not seen on exam earlier this day. Increasing patchy and interstitial bibasilar opacities, right greater than left, may be atelectasis or re-expansion pulmonary edema post thoracentesis ease today. These results were called by telephone at the time of interpretation  on 05/08/2017 at 7:32 pm to Dr. Jani Gravel , who verbally acknowledged these results. Electronically Signed   By: Jeb Levering M.D.   On: 05/08/2017 19:32   Dg Chest Port 1 View  Result Date: 05/08/2017 CLINICAL DATA:  Status post left thoracentesis EXAM: PORTABLE CHEST 1 VIEW COMPARISON:  05/08/2017 at 1413  hours FINDINGS: No pneumothorax is seen status post left thoracentesis. Mild right basilar atelectasis/scarring. Suspected trace right pleural effusion. Calcified granuloma at the lateral left lung base. The heart is normal in size. IMPRESSION: No pneumothorax is seen status post left thoracentesis. These results were called by telephone at the time of interpretation on 05/08/2017 at 3:15 pm to Dr. Heber [place], who verbally acknowledged these results. Electronically Signed   By: Julian Hy M.D.   On: 05/08/2017 15:15   Dg Chest Port 1 View  Result Date: 05/08/2017 CLINICAL DATA:  Right-sided thoracentesis. EXAM: PORTABLE CHEST 1 VIEW COMPARISON:  05/08/2017 at 0655 hours. FINDINGS: Marked decrease in right-sided pleural effusion since thoracentesis with trace fluid blunting the right lateral costophrenic angle. No pneumothorax. Heart is top-normal in size. No aortic aneurysm. Stable granuloma at the left lung base. There is platelike atelectasis in the right lower lobe. No acute osseous abnormality. IMPRESSION: Near complete resolution of right-sided pleural effusion status post thoracentesis. Atelectasis is noted at the right lung base. No pneumothorax is visualized Electronically Signed   By: Ashley Royalty M.D.   On: 05/08/2017 14:40    Anti-infectives: Anti-infectives (From admission, onward)   Start     Dose/Rate Route Frequency Ordered Stop   05/08/17 1000  fluconazole (DIFLUCAN) tablet 100 mg     100 mg Oral Daily 05/08/17 0603 05/13/17 0959   05/04/17 0130  piperacillin-tazobactam (ZOSYN) IVPB 3.375 g     3.375 g 12.5 mL/hr over 240 Minutes Intravenous Every 8 hours 05/03/17 2211     04/28/17 1530  vancomycin (VANCOCIN) IVPB 750 mg/150 ml premix  Status:  Discontinued     750 mg 150 mL/hr over 60 Minutes Intravenous Every 24 hours 04/28/17 1424 05/01/17 1128   04/27/17 1400  piperacillin-tazobactam (ZOSYN) IVPB 3.375 g  Status:  Discontinued     3.375 g 12.5 mL/hr over 240 Minutes  Intravenous Every 8 hours 04/27/17 1158 05/03/17 2211   04/26/17 1830  vancomycin (VANCOCIN) 1,250 mg in sodium chloride 0.9 % 250 mL IVPB  Status:  Discontinued     1,250 mg 166.7 mL/hr over 90 Minutes Intravenous Every 48 hours 04/26/17 1739 04/28/17 1424   04/24/17 1700  vancomycin (VANCOCIN) IVPB 1000 mg/200 mL premix  Status:  Discontinued     1,000 mg 200 mL/hr over 60 Minutes Intravenous  Once 04/24/17 1652 04/24/17 1656   04/24/17 1700  vancomycin (VANCOCIN) IVPB 1000 mg/200 mL premix  Status:  Discontinued     1,000 mg 200 mL/hr over 60 Minutes Intravenous Every 48 hours 04/24/17 1656 04/26/17 1739   04/23/17 1500  piperacillin-tazobactam (ZOSYN) IVPB 3.375 g  Status:  Discontinued     3.375 g 12.5 mL/hr over 240 Minutes Intravenous Every 12 hours 04/23/17 1107 04/27/17 1158   04/18/17 1800  piperacillin-tazobactam (ZOSYN) IVPB 3.375 g  Status:  Discontinued     3.375 g 12.5 mL/hr over 240 Minutes Intravenous Every 12 hours 04/18/17 1032 04/23/17 1107   04/18/17 1400  piperacillin-tazobactam (ZOSYN) IVPB 3.375 g  Status:  Discontinued     3.375 g 12.5 mL/hr over 240 Minutes Intravenous Every 8 hours 04/18/17 1031 04/18/17 1032  04/17/17 1200  piperacillin-tazobactam (ZOSYN) IVPB 3.375 g  Status:  Discontinued     3.375 g 100 mL/hr over 30 Minutes Intravenous Every 6 hours 04/17/17 1030 04/18/17 1031   04/17/17 1100  piperacillin-tazobactam (ZOSYN) IVPB 3.375 g  Status:  Discontinued     3.375 g 12.5 mL/hr over 240 Minutes Intravenous Every 8 hours 04/17/17 1019 04/17/17 1030   04/15/17 2125  Ampicillin-Sulbactam (UNASYN) 3 g in sodium chloride 0.9 % 100 mL IVPB  Status:  Discontinued     3 g 200 mL/hr over 30 Minutes Intravenous Every 8 hours 04/15/17 1529 04/17/17 1019   04/15/17 1100  ampicillin-sulbactam (UNASYN) 1.5 g in sodium chloride 0.9 % 50 mL IVPB  Status:  Discontinued     1.5 g 100 mL/hr over 30 Minutes Intravenous Every 12 hours 04/15/17 0937 04/15/17 1529    04/13/17 2200  levofloxacin (LEVAQUIN) IVPB 500 mg  Status:  Discontinued     500 mg 100 mL/hr over 60 Minutes Intravenous Every 48 hours 04/12/17 0114 04/12/17 0846   04/13/17 1800  piperacillin-tazobactam (ZOSYN) IVPB 2.25 g  Status:  Discontinued     2.25 g 100 mL/hr over 30 Minutes Intravenous Every 8 hours 04/13/17 1050 04/15/17 0937   04/12/17 0200  piperacillin-tazobactam (ZOSYN) IVPB 3.375 g  Status:  Discontinued     3.375 g 12.5 mL/hr over 240 Minutes Intravenous Every 8 hours 04/12/17 0103 04/13/17 1050   04/12/17 0000  levofloxacin (LEVAQUIN) IVPB 750 mg  Status:  Discontinued     750 mg 100 mL/hr over 90 Minutes Intravenous Every 48 hours 04/11/17 2230 04/12/17 0114      Assessment/Plan:    Acute diverticulitis with perforation. [facility]  04/02/17 diverticulitis with drain placement - feculent drainage Transferred due to respiratory failure Earlier hospital course, complicated by DTs, acute respiratory failure with hypoxia, ARDS, aspiration pneumonia and circulatory shock which required intubation and pressors. Lungs are improving but has a little bit of air hunger when he ambulates in the hall and last chest x-ray on November 18 shows layering right effusion and right lower lobe atelectasis or pneumonia and mild interstitial edema.   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  Bilateral pleural effusions.  Successful bilateral thoracentesis yesterday. Chest x-ray today in follow-up  Loculated left-sided ascites.  600 mL aspirated.  Cultures negative  Possible yeast UTI - will give short course of Diflucan  [facility] 04/11/17 - ARDS/pneumonia/COPD Bilateral pleura effusions DT"s - Heavy ETOH use preadmit Hx of  tobacco use Anemia Chronic kidney disease - creatinine 2.08 FEN: carb mod/hearthealthy OH:YWVP stopped 11/16, IV zosyn (11/2=>> day 19)..C. difficile negative. DVT: Heparin Foley: - condom cath and flexiseal.. We will remove these and see if he can do without this should help mobility  Plan:continue PT. Antibiotics. Oral nutrition               He needs to ambulate more.  PT involved.               Try to keep flexes seal and condom cath off so he can get up better               Chest x-ray today Need input from medicine regarding status of pulmonary  function and whether he is except we'll candidate for general anesthesia from a pulmonary standpoint .  Onprobiotics.     LOS: 28 days    Adin Hector 05/09/2017

## 2017-05-09 NOTE — Progress Notes (Signed)
Physical Therapy Treatment Patient Details Name: Dominic Phillips MRN: [id number] DOB: 1960/10/28 Today's Date: 05/09/2017    History of Present Illness Pt admitted to [facility] and transferred on 04/02/17 with c/o abdominal pain.  Pt presented with ETOH withdrawls, liver abscess and SBO on TPN.  10/23 presents with hypoxia and increased WBC count, 10/25 intubated, 10/26 scans show PNA and fluid overload, patient extubated on 04/17/17.  Chart review shows COPD, VDRF, anemia, AKI and hypokalemia in addition to previous listed statements.  PMH: ETOH abuse.  11/8 R thoracentesis.    PT Comments    Pt is making good progress towards his goals today. Pt had been incontinent of feces when PT entered. Pt able to stand from bed, perform his own pericare and walk to sink to wash his hands with min guard assist. After sitting on bed to rest, pt requested to take walk and was able to ambulate 20 feet in hallway with RW and min guard assist before becoming fatigued requiring returning to room. D/c plans remain appropriate. PT will continue to follow acutely.     Follow Up Recommendations  CIR     Equipment Recommendations  Rolling walker with 5" wheels;3in1 (PT)       Precautions / Restrictions Precautions Precautions: Fall Precaution Comments: Drain Restrictions Weight Bearing Restrictions: No    Mobility  Bed Mobility Overal bed mobility: Needs Assistance Bed Mobility: Supine to Sit;Sit to Supine     Supine to sit: Supervision Sit to supine: Supervision   General bed mobility comments: supervision for safety,   Transfers Overall transfer level: Needs assistance Equipment used: Rolling walker (2 wheeled) Transfers: Sit to/from Stand Sit to Stand: Min guard         General transfer comment: min guard for safety, good power up and steadying with RW   Ambulation/Gait Ambulation/Gait assistance: Min guard Ambulation Distance (Feet): 50 Feet Assistive device: Rolling walker  (2 wheeled) Gait Pattern/deviations: Step-through pattern;Decreased step length - right;Decreased step length - left;Trunk flexed;Shuffle Gait velocity: slowed Gait velocity interpretation: Below normal speed for age/gender General Gait Details: hands on min guard for safety, vc for upright posture and staying close to RW, mild instability but no overt LoB, pt with increased fatigue after ambulation       Balance Overall balance assessment: Needs assistance Sitting-balance support: No upper extremity supported;Feet supported Sitting balance-Leahy Scale: Good Sitting balance - Comments: able to reach outside BoS   Standing balance support: During functional activity;No upper extremity supported Standing balance-Leahy Scale: Good Standing balance comment: able to perform pericare without LoB                            Cognition Arousal/Alertness: Awake/alert Behavior During Therapy: WFL for tasks assessed/performed Overall Cognitive Status: Within Functional Limits for tasks assessed                                           General Comments General comments (skin integrity, edema, etc.): Pt on 2L O2 via nasal cannula at entry with SaO2 of 97%O2, supplemental O2 removed for treatment SaO2 remained greater than 91%O2 throughout session, supplemental O2 not replaced after treatment, RN notified      Pertinent Vitals/Pain Pain Assessment: Faces Faces Pain Scale: Hurts even more Pain Location: stomach Pain Descriptors / Indicators: Aching;Grimacing;Guarding;Moaning  PT Goals (current goals can now be found in the care plan section) Acute Rehab PT Goals PT Goal Formulation: With patient Time For Goal Achievement: 05/18/17 Potential to Achieve Goals: Good Progress towards PT goals: Progressing toward goals    Frequency    Min 3X/week      PT Plan Current plan remains appropriate       AM-PAC PT "6 Clicks" Daily Activity   Outcome Measure  Difficulty turning over in bed (including adjusting bedclothes, sheets and blankets)?: A Little Difficulty moving from lying on back to sitting on the side of the bed? : A Little Difficulty sitting down on and standing up from a chair with arms (e.g., wheelchair, bedside commode, etc,.)?: A Little Help needed moving to and from a bed to chair (including a wheelchair)?: A Little Help needed walking in hospital room?: A Little Help needed climbing 3-5 steps with a railing? : A Lot 6 Click Score: 17    End of Session Equipment Utilized During Treatment: Gait belt Activity Tolerance: Patient tolerated treatment well Patient left: in bed;with call bell/phone within reach;with bed alarm set Nurse Communication: Mobility status PT Visit Diagnosis: Unsteadiness on feet (R26.81);Muscle weakness (generalized) (M62.81);Other abnormalities of gait and mobility (R26.89)     Time: 1356-1440 PT Time Calculation (min) (ACUTE ONLY): 44 min  Charges:  $Gait Training: 8-22 mins $Therapeutic Activity: 23-37 mins                    G Codes:       Kaylon Laroche B. Migdalia Dk PT, DPT Acute Rehabilitation  [phone] Pager [phone]     [place] 05/09/2017, 2:58 PM

## 2017-05-09 NOTE — Progress Notes (Signed)
Referring Physician(s): Dr Leane Para  Supervising Physician: Corrie Mckusick  Patient Status:  [facility] - In-pt  Chief Complaint:  Diverticular abscess Drain placed in [place] 04/02/17      Subjective:  Plan for surgery with CCS soon Drain is intact draining well OP feculent Yellow brown color RN changes site dressing at least daily  Allergies: Patient has no known allergies.  Medications: Prior to Admission medications   Not on File     Vital Signs: BP 120/90 (BP Location: Right Arm)   Pulse 98   Temp 98 F (36.7 C) (Oral)   Resp 20   Ht 5\' 11"  (1.803 m)   Wt 129 lb 13.6 oz (58.9 kg)   SpO2 100%   BMI 18.11 kg/m   Physical Exam  Constitutional: He is oriented to person, place, and time.  Abdominal: Soft.  Musculoskeletal: Normal range of motion.  Neurological: He is alert and oriented to person, place, and time.  Skin: Skin is warm and dry.  Drain site is clean and dry Tender OP brown yellow feculent material Leaks at site--- RN changes dressing often  Nursing note and vitals reviewed.   Imaging: Dg Chest 2 View  Result Date: 05/08/2017 CLINICAL DATA:  Follow-up pneumonia EXAM: CHEST  2 VIEW COMPARISON:  05/04/2015 FINDINGS: Cardiac shadow is stable. Persistent right basilar infiltrate and effusion is noted. Improved aeration in the left base is noted. Persistent posterior fusion is noted on the left. Calcified granuloma in the left lung base is seen. No acute bony abnormality is noted. IMPRESSION: Bilateral pleural effusions and persistent right basilar infiltrate. Electronically Signed   By: Inez Catalina M.D.   On: 05/08/2017 07:16   Dg Chest Port 1 View  Result Date: 05/09/2017 CLINICAL DATA:  Bilateral thoracentesis on 05/08/2017. Evaluate for pleural effusion. EXAM: PORTABLE CHEST 1 VIEW COMPARISON:  05/08/2017 FINDINGS: No definite pneumothorax on this examination. Increased densities in the right lower chest compared to the most recent  examination. Heart and mediastinum are within normal limits and stable. The trachea is midline. Calcified granuloma at the left lung base. IMPRESSION: Increased densities in the right lower chest. Findings could represent increasing airspace disease and/or pleural fluid. No pneumothorax. Electronically Signed   By: Markus Daft M.D.   On: 05/09/2017 10:09   Dg Chest Port 1 View  Result Date: 05/08/2017 CLINICAL DATA:  Pleural effusion. Post bilateral thoracentesis earlier this day. EXAM: PORTABLE CHEST 1 VIEW COMPARISON:  Most recent comparison earlier this day at 1500 hour, nearly 4 hours prior. FINDINGS: Unchanged heart size and mediastinal contours. Tiny left apicolateral pneumothorax, less than 5%, not seen on prior exam. No right pneumothorax. Increased patchy and interstitial opacities in both lung bases, right greater than left, may be atelectasis or re-expansion pulmonary edema post thoracentesis. No definite pleural fluid seen on single upright view. Calcified granuloma at the left lung base. IMPRESSION: Tiny left apical and lateral pneumothorax, less than 5%, not seen on exam earlier this day. Increasing patchy and interstitial bibasilar opacities, right greater than left, may be atelectasis or re-expansion pulmonary edema post thoracentesis ease today. These results were called by telephone at the time of interpretation on 05/08/2017 at 7:32 pm to Dr. Jani Gravel , who verbally acknowledged these results. Electronically Signed   By: Jeb Levering M.D.   On: 05/08/2017 19:32   Dg Chest Port 1 View  Result Date: 05/08/2017 CLINICAL DATA:  Status post left thoracentesis EXAM: PORTABLE CHEST 1 VIEW COMPARISON:  05/08/2017 at 1413 hours FINDINGS: No pneumothorax is seen status post left thoracentesis. Mild right basilar atelectasis/scarring. Suspected trace right pleural effusion. Calcified granuloma at the lateral left lung base. The heart is normal in size. IMPRESSION: No pneumothorax is seen status  post left thoracentesis. These results were called by telephone at the time of interpretation on 05/08/2017 at 3:15 pm to Dr. Heber Kibler, who verbally acknowledged these results. Electronically Signed   By: Julian Hy M.D.   On: 05/08/2017 15:15   Dg Chest Port 1 View  Result Date: 05/08/2017 CLINICAL DATA:  Right-sided thoracentesis. EXAM: PORTABLE CHEST 1 VIEW COMPARISON:  05/08/2017 at 0655 hours. FINDINGS: Marked decrease in right-sided pleural effusion since thoracentesis with trace fluid blunting the right lateral costophrenic angle. No pneumothorax. Heart is top-normal in size. No aortic aneurysm. Stable granuloma at the left lung base. There is platelike atelectasis in the right lower lobe. No acute osseous abnormality. IMPRESSION: Near complete resolution of right-sided pleural effusion status post thoracentesis. Atelectasis is noted at the right lung base. No pneumothorax is visualized Electronically Signed   By: Ashley Royalty M.D.   On: 05/08/2017 14:40   Ir Paracentesis  Result Date: 05/05/2017 INDICATION: Patient with history of diverticulitis, now with large intra-abdominal fluid collection suspicious for loculated ascites. Is made for diagnostic and therapeutic paracentesis. EXAM: ULTRASOUND GUIDED DIAGNOSTIC AND THERAPEUTIC PARACENTESIS MEDICATIONS: 10 mL 2% lidocaine COMPLICATIONS: None immediate. PROCEDURE: Informed written consent was obtained from the patient after a discussion of the risks, benefits and alternatives to treatment. A timeout was performed prior to the initiation of the procedure. Initial ultrasound scanning demonstrates a large amount of ascites within the left lateral abdomen. The left lateral abdomen was prepped and draped in the usual sterile fashion. 2% lidocaine was used for local anesthesia. Following this, a 19 gauge, 7-cm, Yueh catheter was introduced. An ultrasound image was saved for documentation purposes. The paracentesis was performed. The catheter was  removed and a dressing was applied. The patient tolerated the procedure well without immediate post procedural complication. FINDINGS: A total of approximately 600 mL of clear, yellow fluid was removed. Samples were sent to the laboratory as requested by the clinical team. IMPRESSION: Successful ultrasound-guided diagnostic and therapeutic paracentesis yielding 600 mL of peritoneal fluid. Read by:  Brynda Greathouse PA-C Electronically Signed   By: Aletta Edouard M.D.   On: 05/05/2017 16:06    Labs:  CBC: Recent Labs    05/04/17 0216 05/05/17 0324 05/06/17 0828 05/07/17 0205  WBC 11.7* 12.5* 10.4 11.9*  HGB 9.3* 9.4* 9.2* 9.7*  HCT 28.8* 29.6* 28.6* 30.6*  PLT 438* 508* 491* 482*    COAGS: Recent Labs    04/13/17 0952  INR 1.24  APTT 35    BMP: Recent Labs    05/05/17 0324 05/06/17 0828 05/07/17 0205 05/09/17 0334  NA 135 137 138 137  K 3.8 3.6 3.7 2.8*  CL 97* 101 100* 97*  CO2 28 26 28 28   GLUCOSE 99 94 108* 104*  BUN 35* 30* 31* 29*  CALCIUM 8.1* 8.2* 8.3* 8.3*  CREATININE 2.08* 1.87* 1.77* 1.41*  GFRNONAA 34* 39* 42* 55*  GFRAA 40* 45* 48* >60    LIVER FUNCTION TESTS: Recent Labs    04/27/17 0335 04/28/17 0209 04/29/17 0224 04/30/17 0418 05/04/17 0216 05/08/17 1539  BILITOT 0.9 0.7 0.7  --  0.5  --   AST 17 19 16   --  13*  --   ALT 8* 9* 9*  --  6*  --   ALKPHOS 58 69 67  --  73  --   PROT 5.9* 6.5 6.6  --  6.4* 6.3*  ALBUMIN 1.7* 1.8* 1.9* 1.8* 1.7*  --     Assessment and Plan:  Diverticular abscess drain in place For CCS surgery soon  Electronically Signed: Zakyria Metzinger A, PA-C 05/09/2017, 12:56 PM   I spent a total of 15 Minutes at the the patient's bedside AND on the patient's hospital floor or unit, greater than 50% of which was counseling/coordinating care for divertic drain

## 2017-05-09 NOTE — Progress Notes (Signed)
PROGRESS NOTE    Dominic Phillips  MGQ:[id number] DOB: November 18, 1960 DOA: 04/11/2017 PCP: No primary care provider on file.  Brief Narrative: Assessment & Pla40 year old male with PMH of alcohol abuse presented to [facility] on 04/02/17 with complaints of abdominal pain without fevers, leukocytosis or diarrhea. CT abdomen revealed pericolonic inflammation/fluid collection and he was admitted for diverticular abscess. General surgery was consulted who suggested placement of percutaneous drain by IR. Cultures grew Escherichia coli. Hospital course complicated by DTs, acute respiratory failure with hypoxia/ARDS/aspiration pneumonia and circulatory shock for which he required intubation and pressors, acute kidney injury for which she required CRRT and IHD 1. Shock resolved. Renal functions improving. General surgery continue to follow. Nephrology signed off.   Active Problems:   Acute respiratory distress syndrome (ARDS) (HCC)   Acute kidney injury ([place])   Pressure injury of skin   Colonic diverticular abscess   COPD (chronic obstructive pulmonary disease) (HCC)   Dyspnea and respiratory abnormalities   HCAP (healthcare-associated pneumonia)   Acute blood loss anemia   Anemia of chronic disease   ETOH abuse   Tobacco abuse   Tachycardia   Tachypnea   Leukocytosis   Post-operative pain   Acute respiratory failure with hypoxia ([place])   Palliative care by specialist   History of ETT   Pleural effusion   Encounter for imaging study to confirm orogastric (OG) tube placement   Endotracheally intubated   Acute respiratory failure with hypoxia/ARDS/VDRF - multifactorial: 2/2 Pulmonary edema vs pleural effusions vs atelectasis vs pneumonia. -weaned down from 15 L/m HFNC oxygen to 3 L/m. Continue weaning  oxygen as tolerated to keep saturations >90%, cotninue incentive spirometry, increase mobilization and activity.  -Required intubation from 10/27 to 11/2 -continue intermittent diuresis as  tolerated; but careful with renal function  -Follow CXR intermittently; pulmonary service will see patient. -s/p thoracentesis on 11/8 (1.3L removed; transudate) -had repeat bilateral thoracentesis on 11/23 (800 ml from left and 1100 ml from right) -patient on zosyn -will follow rec's from pulmonary service.  Hypokalemia-- -K 2.8 -will replete -MG borderline low; will also replete  Diverticulitis with perforation and abscess, S/P IR drain - Gen. 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 - Continue IV antibiotics - REPEAT CT 05/04/17-No residual fluid collection at site of pigtail drainage catheter in right pelvis. - patient with ongoing fistula; per surgery rec's will need sigmoidectomy and colostomy. -will follow pulmonary and general surgery rec's. -flexiseal removed.  Moderate BILATERAL pleural effusions  -with compressive atelectasis of both lower lobes and question right lower lobe consolidation. -patient afebrile and with stable/improved breathing -will continue zosyn  -repeat CXR with improved ariation, still demonstrating right lower lobe airspace changes and bilateral effusions (both of them improved). -appreciate pulmonary service assistance. Patient s/o post bilateral pleural effusion on 11/23. small left apical pneumothorax appreciated. Patient breathing improved.   Acute kidney injury status post CRRT 10/27 and intermittent dialysis. -Renal function improved and stable -renal service sign off -follow intermittent BMET to follow Cr trend  -Cr stable -maintain adequate hydration  -HD catheter removed on 11/10  Anemia of chronic disease.  -Continue iron replacement.  -Status post 1 unit of blood transfusion.  -follow Hgb trend intermittently -no signs of acute bleeding appreciated -last hemoglobin level 9.7; will check CBC in am  Moderate malnutrition with chronic illness and hx  of alcohol abuse- -appreciated nutritional service input -will continue feeding supplements.   DVT prophylaxis.  Heparin Code Status: Partial (  no prolonged mechanical ventilation and intubation) Family Communication: no family at bedside  Disposition Plan: Plan is for him to go to inpatient rehab once he is medically stable. CCS anticipating sigmoidectomy and colostomy once fully recover from pulmonary stand point.  Consultants:  General surgery IR PCCM  Procedures: IR drain for acute diverticulitis with perforation.  Antimicrobials: Zosyn  Vitals:   05/08/17 0610 05/08/17 2144 05/09/17 0500 05/09/17 0602  BP: 140/85 113/72  120/90  Pulse: 94 (!) 101  98  Resp: 18 20  20   Temp: 98.1 F (36.7 C) 98.1 F (36.7 C)  98 F (36.7 C)  TempSrc: Oral Oral  Oral  SpO2: 97% 96%  100%  Weight:   58.9 kg (129 lb 13.6 oz)   Height:        Intake/Output Summary (Last 24 hours) at 05/09/2017 1154 Last data filed at 05/09/2017 0116 Gross per 24 hour  Intake 50 ml  Output 581 ml  Net -531 ml   Filed Weights   05/07/17 0500 05/08/17 0459 05/09/17 0500  Weight: 59.4 kg (130 lb 15.3 oz) 58 kg (127 lb 13.9 oz) 58.9 kg (129 lb 13.6 oz)    Examination: General exam: afebrile, no CP and reporting breathing is much improved today. No nausea, no vomiting. Chronically ill in appearance and underweight. Still wearing [place] supplementation. Respiratory system: improved air movement bilaterally, no wheezing, no frank crackles. Cardiovascular system: no JVD, no murmurs, RRR, no JVD.   Gastrointestinal system: soft, no major distension; positive BS; mild tenderness to palpation in his lower quadrants. Percutaneous drain in place. Central nervous system: oriented X3, no focal motor deficit. CN intact. Patient moving four limbs spontaneously. Extremities: no edema, no cyanosis. Skin: patient drain continue to have yellow feculent material. flexiseal has been discontinued.  Data Reviewed: I have  personally reviewed following labs and imaging studies  CBC: Recent Labs  Lab 05/03/17 0319 05/04/17 0216 05/05/17 0324 05/06/17 0828 05/07/17 0205  WBC 15.2* 11.7* 12.5* 10.4 11.9*  NEUTROABS  --  8.0* 8.3*  --   --   HGB 9.8* 9.3* 9.4* 9.2* 9.7*  HCT 30.1* 28.8* 29.6* 28.6* 30.6*  MCV 93.2 93.5 94.3 95.3 95.3  PLT 505* 438* 508* 491* 540*   Basic Metabolic Panel: Recent Labs  Lab 05/03/17 1311 05/04/17 0216 05/05/17 0324 05/06/17 0828 05/07/17 0205 05/09/17 0334 05/09/17 0830  NA  --  137 135 137 138 137  --   K  --  2.9* 3.8 3.6 3.7 2.8*  --   CL  --  98* 97* 101 100* 97*  --   CO2  --  28 28 26 28 28   --   GLUCOSE  --  100* 99 94 108* 104*  --   BUN  --  37* 35* 30* 31* 29*  --   CREATININE  --  2.36* 2.08* 1.87* 1.77* 1.41*  --   CALCIUM  --  8.0* 8.1* 8.2* 8.3* 8.3*  --   MG 1.6*  --   --   --   --   --  1.8   GFR: Estimated Creatinine Clearance: 49.9 mL/min (A) (by C-G formula based on SCr of 1.41 mg/dL (H)).   Liver Function Tests: Recent Labs  Lab 05/04/17 0216 05/08/17 1539  AST 13*  --   ALT 6*  --   ALKPHOS 73  --   BILITOT 0.5  --   PROT 6.4* 6.3*  ALBUMIN 1.7*  --    CBG: Recent Labs  Lab 05/03/17 2141 05/08/17 0808  GLUCAP 91 96    Recent Results (from the past 240 hour(s))  Culture, body fluid-bottle     Status: None (Preliminary result)   Collection Time: 05/05/17  2:49 PM  Result Value Ref Range Status   Specimen Description FLUID PERITONEAL  Final   Special Requests BOTTLES DRAWN AEROBIC AND ANAEROBIC  Final   Culture NO GROWTH 3 DAYS  Final   Report Status PENDING  Incomplete  Gram stain     Status: None   Collection Time: 05/05/17  2:49 PM  Result Value Ref Range Status   Specimen Description FLUID PERITONEAL  Final   Special Requests NONE  Final   Gram Stain   Final    CYTOSPIN SMEAR WBC PRESENT,BOTH PMN AND MONONUCLEAR NO ORGANISMS SEEN    Report Status 05/05/2017 FINAL  Final  Urine Culture     Status: Abnormal    Collection Time: 05/06/17  9:18 AM  Result Value Ref Range Status   Specimen Description URINE, RANDOM  Final   Special Requests NONE  Final   Culture 20,000 COLONIES/mL YEAST (A)  Final   Report Status 05/07/2017 FINAL  Final  C difficile quick scan w PCR reflex     Status: None   Collection Time: 05/06/17  9:18 AM  Result Value Ref Range Status   C Diff antigen NEGATIVE NEGATIVE Final   C Diff toxin NEGATIVE NEGATIVE Final   C Diff interpretation No C. difficile detected.  Final  Gram stain     Status: None   Collection Time: 05/08/17  3:15 PM  Result Value Ref Range Status   Specimen Description PLEURAL  Final   Special Requests RIGHT  Final   Gram Stain   Final    WBC PRESENT, PREDOMINANTLY MONONUCLEAR NO ORGANISMS SEEN CYTOSPIN SMEAR    Report Status 05/09/2017 FINAL  Final     Radiology Studies: Dg Chest 2 View  Result Date: 05/08/2017 CLINICAL DATA:  Follow-up pneumonia EXAM: CHEST  2 VIEW COMPARISON:  05/04/2015 FINDINGS: Cardiac shadow is stable. Persistent right basilar infiltrate and effusion is noted. Improved aeration in the left base is noted. Persistent posterior fusion is noted on the left. Calcified granuloma in the left lung base is seen. No acute bony abnormality is noted. IMPRESSION: Bilateral pleural effusions and persistent right basilar infiltrate. Electronically Signed   By: Inez Catalina M.D.   On: 05/08/2017 07:16   Dg Chest Port 1 View  Result Date: 05/09/2017 CLINICAL DATA:  Bilateral thoracentesis on 05/08/2017. Evaluate for pleural effusion. EXAM: PORTABLE CHEST 1 VIEW COMPARISON:  05/08/2017 FINDINGS: No definite pneumothorax on this examination. Increased densities in the right lower chest compared to the most recent examination. Heart and mediastinum are within normal limits and stable. The trachea is midline. Calcified granuloma at the left lung base. IMPRESSION: Increased densities in the right lower chest. Findings could represent increasing  airspace disease and/or pleural fluid. No pneumothorax. Electronically Signed   By: Markus Daft M.D.   On: 05/09/2017 10:09   Dg Chest Port 1 View  Result Date: 05/08/2017 CLINICAL DATA:  Pleural effusion. Post bilateral thoracentesis earlier this day. EXAM: PORTABLE CHEST 1 VIEW COMPARISON:  Most recent comparison earlier this day at 1500 hour, nearly 4 hours prior. FINDINGS: Unchanged heart size and mediastinal contours. Tiny left apicolateral pneumothorax, less than 5%, not seen on prior exam. No right pneumothorax. Increased patchy and interstitial opacities in both lung bases, right greater than left, may be  atelectasis or re-expansion pulmonary edema post thoracentesis. No definite pleural fluid seen on single upright view. Calcified granuloma at the left lung base. IMPRESSION: Tiny left apical and lateral pneumothorax, less than 5%, not seen on exam earlier this day. Increasing patchy and interstitial bibasilar opacities, right greater than left, may be atelectasis or re-expansion pulmonary edema post thoracentesis ease today. These results were called by telephone at the time of interpretation on 05/08/2017 at 7:32 pm to Dr. Jani Gravel , who verbally acknowledged these results. Electronically Signed   By: Jeb Levering M.D.   On: 05/08/2017 19:32   Dg Chest Port 1 View  Result Date: 05/08/2017 CLINICAL DATA:  Status post left thoracentesis EXAM: PORTABLE CHEST 1 VIEW COMPARISON:  05/08/2017 at 1413 hours FINDINGS: No pneumothorax is seen status post left thoracentesis. Mild right basilar atelectasis/scarring. Suspected trace right pleural effusion. Calcified granuloma at the lateral left lung base. The heart is normal in size. IMPRESSION: No pneumothorax is seen status post left thoracentesis. These results were called by telephone at the time of interpretation on 05/08/2017 at 3:15 pm to Dr. Heber [place], who verbally acknowledged these results. Electronically Signed   By: Julian Hy M.D.   On:  05/08/2017 15:15   Dg Chest Port 1 View  Result Date: 05/08/2017 CLINICAL DATA:  Right-sided thoracentesis. EXAM: PORTABLE CHEST 1 VIEW COMPARISON:  05/08/2017 at 0655 hours. FINDINGS: Marked decrease in right-sided pleural effusion since thoracentesis with trace fluid blunting the right lateral costophrenic angle. No pneumothorax. Heart is top-normal in size. No aortic aneurysm. Stable granuloma at the left lung base. There is platelike atelectasis in the right lower lobe. No acute osseous abnormality. IMPRESSION: Near complete resolution of right-sided pleural effusion status post thoracentesis. Atelectasis is noted at the right lung base. No pneumothorax is visualized Electronically Signed   By: Ashley Royalty M.D.   On: 05/08/2017 14:40    Scheduled Meds: . feeding supplement (ENSURE ENLIVE)  237 mL Oral QID  . feeding supplement (PRO-STAT SUGAR FREE 64)  30 mL Oral BID  . fluconazole  100 mg Oral Daily  . folic acid  1 mg Oral QHS  . guaiFENesin  1,200 mg Oral BID  . heparin subcutaneous  5,000 Units Subcutaneous Q8H  . mouth rinse  15 mL Mouth Rinse BID  . multivitamin with minerals  1 tablet Oral Daily  . pantoprazole  40 mg Oral QHS  . potassium chloride  40 mEq Oral Q4H  . saccharomyces boulardii  250 mg Oral BID  . thiamine  100 mg Oral QHS  . traZODone  50 mg Oral QHS   Continuous Infusions: . sodium chloride    . piperacillin-tazobactam (ZOSYN)  IV 3.375 g (05/09/17 0857)     LOS: 28 days    Barton Dubois, MD [facility] Pager [phone]  If 7PM-7AM, please contact night-coverage [email or web address] Password [facility] 05/09/2017, 11:54 AM

## 2017-05-10 ENCOUNTER — Inpatient Hospital Stay (HOSPITAL_COMMUNITY): Payer: Medicaid Other

## 2017-05-10 LAB — BASIC METABOLIC PANEL
ANION GAP: 9 (ref 5–15)
BUN: 26 mg/dL — AB (ref 6–20)
CHLORIDE: 102 mmol/L (ref 101–111)
CO2: 27 mmol/L (ref 22–32)
CREATININE: 1.32 mg/dL — AB (ref 0.61–1.24)
Calcium: 8.5 mg/dL — ABNORMAL LOW (ref 8.9–10.3)
GFR calc Af Amer: >60 mL/min (ref >60)
GFR calc non Af Amer: 60 mL/min — ABNORMAL LOW (ref >60)
GLUCOSE: 99 mg/dL (ref 65–99)
POTASSIUM: 3.8 mmol/L (ref 3.5–5.1)
Sodium: 138 mmol/L (ref 135–145)

## 2017-05-10 LAB — CULTURE, BODY FLUID-BOTTLE: CULTURE: NO GROWTH

## 2017-05-10 LAB — CULTURE, BODY FLUID W GRAM STAIN -BOTTLE

## 2017-05-10 LAB — MAGNESIUM: Magnesium: 2.3 mg/dL (ref 1.7–2.4)

## 2017-05-10 MED ORDER — POTASSIUM CHLORIDE CRYS ER 20 MEQ PO TBCR
40.0000 meq | EXTENDED_RELEASE_TABLET | Freq: Once | ORAL | Status: AC
  Administered 2017-05-10: 40 meq via ORAL
  Filled 2017-05-10: qty 2

## 2017-05-10 MED ORDER — FUROSEMIDE 10 MG/ML IJ SOLN
20.0000 mg | Freq: Every day | INTRAMUSCULAR | Status: AC
  Administered 2017-05-10 – 2017-05-11 (×2): 20 mg via INTRAVENOUS
  Filled 2017-05-10 (×2): qty 2

## 2017-05-10 NOTE — Progress Notes (Addendum)
Subjective: Alert and stable.  No respiratory complaints.  No air hunger.  Trying to ambulate more. No abdominal pain at rest but still tender. Stools loose and a little bit difficult to control. Voiding well.  No incontinence C. difficile negative Continues with feculent drainage from abdominal drain.  Controlled colocutaneous fistula obvious. Started on Diflucan 11/23 for yeast UTI.  5 day course.  Bilateral thoracentesis by Dr. Feinstein11/23  revealed 800 mL on the left and 1100 mL on the right.  Follow-up chest x-ray looks much better with tiny pneumothorax.  Follow-up chest x-ray - no ptx. .  He doesn't have any increased work of breathing and feels a little better from a pulmonary standpoint.  He knows that we are still heading toward [facility] resection next week and that the decision on date and timing will probably be finalized on Monday.  I think his medical condition is optimized as it can be.    Objective: Vital signs in last 24 hours: Temp:  [98.1 F (36.7 C)-98.3 F (36.8 C)] 98.1 F (36.7 C) (11/25 0606) Pulse Rate:  [87-97] 94 (11/25 0606) Resp:  [16-20] 20 (11/25 0606) BP: (104-117)/(65-72) 117/72 (11/25 0606) SpO2:  [88 %-96 %] 94 % (11/25 0606) Weight:  [54.3 kg (119 lb 11.4 oz)] 54.3 kg (119 lb 11.4 oz) (11/25 0138) Last BM Date: 05/09/17  Intake/Output from previous day: 11/24 0701 - 11/25 0700 In: 200 [IV Piggyback:200] Out: 975 [Urine:975] Intake/Output this shift: Total I/O In: 50 [IV Piggyback:50] Out: 550 [Urine:550]   EXAM: General appearance:alert, cooperative and no distress.  Somewhat deconditioned. Lungs: Decreased breath sounds bases. No wheeze. No rhonchi. XT:GGYIR is feculent, mild distension, ongoing discomfortand tendernesslower abdomen, seems less tender. Relatively soft      Lab Results:  No results for input(s): WBC, HGB, HCT, PLT in the last 72 hours. BMET Recent Labs    05/09/17 0334 05/10/17 0428  NA 137  138  K 2.8* 3.8  CL 97* 102  CO2 28 27  GLUCOSE 104* 99  BUN 29* 26*  CREATININE 1.41* 1.32*  CALCIUM 8.3* 8.5*   PT/INR No results for input(s): LABPROT, INR in the last 72 hours. ABG No results for input(s): PHART, HCO3 in the last 72 hours.  Invalid input(s): PCO2, PO2  Studies/Results: Dg Chest 2 View  Result Date: 05/08/2017 CLINICAL DATA:  Follow-up pneumonia EXAM: CHEST  2 VIEW COMPARISON:  05/04/2015 FINDINGS: Cardiac shadow is stable. Persistent right basilar infiltrate and effusion is noted. Improved aeration in the left base is noted. Persistent posterior fusion is noted on the left. Calcified granuloma in the left lung base is seen. No acute bony abnormality is noted. IMPRESSION: Bilateral pleural effusions and persistent right basilar infiltrate. Electronically Signed   By: Inez Catalina M.D.   On: 05/08/2017 07:16   Dg Chest Port 1 View  Result Date: 05/09/2017 CLINICAL DATA:  Bilateral thoracentesis on 05/08/2017. Evaluate for pleural effusion. EXAM: PORTABLE CHEST 1 VIEW COMPARISON:  05/08/2017 FINDINGS: No definite pneumothorax on this examination. Increased densities in the right lower chest compared to the most recent examination. Heart and mediastinum are within normal limits and stable. The trachea is midline. Calcified granuloma at the left lung base. IMPRESSION: Increased densities in the right lower chest. Findings could represent increasing airspace disease and/or pleural fluid. No pneumothorax. Electronically Signed   By: Markus Daft M.D.   On: 05/09/2017 10:09   Dg Chest Port 1 View  Result Date: 05/08/2017 CLINICAL DATA:  Pleural effusion. Post  bilateral thoracentesis earlier this day. EXAM: PORTABLE CHEST 1 VIEW COMPARISON:  Most recent comparison earlier this day at 1500 hour, nearly 4 hours prior. FINDINGS: Unchanged heart size and mediastinal contours. Tiny left apicolateral pneumothorax, less than 5%, not seen on prior exam. No right pneumothorax.  Increased patchy and interstitial opacities in both lung bases, right greater than left, may be atelectasis or re-expansion pulmonary edema post thoracentesis. No definite pleural fluid seen on single upright view. Calcified granuloma at the left lung base. IMPRESSION: Tiny left apical and lateral pneumothorax, less than 5%, not seen on exam earlier this day. Increasing patchy and interstitial bibasilar opacities, right greater than left, may be atelectasis or re-expansion pulmonary edema post thoracentesis ease today. These results were called by telephone at the time of interpretation on 05/08/2017 at 7:32 pm to Dr. Jani Gravel , who verbally acknowledged these results. Electronically Signed   By: Jeb Levering M.D.   On: 05/08/2017 19:32   Dg Chest Port 1 View  Result Date: 05/08/2017 CLINICAL DATA:  Status post left thoracentesis EXAM: PORTABLE CHEST 1 VIEW COMPARISON:  05/08/2017 at 1413 hours FINDINGS: No pneumothorax is seen status post left thoracentesis. Mild right basilar atelectasis/scarring. Suspected trace right pleural effusion. Calcified granuloma at the lateral left lung base. The heart is normal in size. IMPRESSION: No pneumothorax is seen status post left thoracentesis. These results were called by telephone at the time of interpretation on 05/08/2017 at 3:15 pm to Dr. Heber Lamar, who verbally acknowledged these results. Electronically Signed   By: Julian Hy M.D.   On: 05/08/2017 15:15   Dg Chest Port 1 View  Result Date: 05/08/2017 CLINICAL DATA:  Right-sided thoracentesis. EXAM: PORTABLE CHEST 1 VIEW COMPARISON:  05/08/2017 at 0655 hours. FINDINGS: Marked decrease in right-sided pleural effusion since thoracentesis with trace fluid blunting the right lateral costophrenic angle. No pneumothorax. Heart is top-normal in size. No aortic aneurysm. Stable granuloma at the left lung base. There is platelike atelectasis in the right lower lobe. No acute osseous abnormality. IMPRESSION:  Near complete resolution of right-sided pleural effusion status post thoracentesis. Atelectasis is noted at the right lung base. No pneumothorax is visualized Electronically Signed   By: Ashley Royalty M.D.   On: 05/08/2017 14:40    Anti-infectives: Anti-infectives (From admission, onward)   Start     Dose/Rate Route Frequency Ordered Stop   05/08/17 1000  fluconazole (DIFLUCAN) tablet 100 mg     100 mg Oral Daily 05/08/17 0603 05/13/17 0959   05/04/17 0130  piperacillin-tazobactam (ZOSYN) IVPB 3.375 g     3.375 g 12.5 mL/hr over 240 Minutes Intravenous Every 8 hours 05/03/17 2211     04/28/17 1530  vancomycin (VANCOCIN) IVPB 750 mg/150 ml premix  Status:  Discontinued     750 mg 150 mL/hr over 60 Minutes Intravenous Every 24 hours 04/28/17 1424 05/01/17 1128   04/27/17 1400  piperacillin-tazobactam (ZOSYN) IVPB 3.375 g  Status:  Discontinued     3.375 g 12.5 mL/hr over 240 Minutes Intravenous Every 8 hours 04/27/17 1158 05/03/17 2211   04/26/17 1830  vancomycin (VANCOCIN) 1,250 mg in sodium chloride 0.9 % 250 mL IVPB  Status:  Discontinued     1,250 mg 166.7 mL/hr over 90 Minutes Intravenous Every 48 hours 04/26/17 1739 04/28/17 1424   04/24/17 1700  vancomycin (VANCOCIN) IVPB 1000 mg/200 mL premix  Status:  Discontinued     1,000 mg 200 mL/hr over 60 Minutes Intravenous  Once 04/24/17 1652 04/24/17 1656  04/24/17 1700  vancomycin (VANCOCIN) IVPB 1000 mg/200 mL premix  Status:  Discontinued     1,000 mg 200 mL/hr over 60 Minutes Intravenous Every 48 hours 04/24/17 1656 04/26/17 1739   04/23/17 1500  piperacillin-tazobactam (ZOSYN) IVPB 3.375 g  Status:  Discontinued     3.375 g 12.5 mL/hr over 240 Minutes Intravenous Every 12 hours 04/23/17 1107 04/27/17 1158   04/18/17 1800  piperacillin-tazobactam (ZOSYN) IVPB 3.375 g  Status:  Discontinued     3.375 g 12.5 mL/hr over 240 Minutes Intravenous Every 12 hours 04/18/17 1032 04/23/17 1107   04/18/17 1400  piperacillin-tazobactam (ZOSYN)  IVPB 3.375 g  Status:  Discontinued     3.375 g 12.5 mL/hr over 240 Minutes Intravenous Every 8 hours 04/18/17 1031 04/18/17 1032   04/17/17 1200  piperacillin-tazobactam (ZOSYN) IVPB 3.375 g  Status:  Discontinued     3.375 g 100 mL/hr over 30 Minutes Intravenous Every 6 hours 04/17/17 1030 04/18/17 1031   04/17/17 1100  piperacillin-tazobactam (ZOSYN) IVPB 3.375 g  Status:  Discontinued     3.375 g 12.5 mL/hr over 240 Minutes Intravenous Every 8 hours 04/17/17 1019 04/17/17 1030   04/15/17 2125  Ampicillin-Sulbactam (UNASYN) 3 g in sodium chloride 0.9 % 100 mL IVPB  Status:  Discontinued     3 g 200 mL/hr over 30 Minutes Intravenous Every 8 hours 04/15/17 1529 04/17/17 1019   04/15/17 1100  ampicillin-sulbactam (UNASYN) 1.5 g in sodium chloride 0.9 % 50 mL IVPB  Status:  Discontinued     1.5 g 100 mL/hr over 30 Minutes Intravenous Every 12 hours 04/15/17 0937 04/15/17 1529   04/13/17 2200  levofloxacin (LEVAQUIN) IVPB 500 mg  Status:  Discontinued     500 mg 100 mL/hr over 60 Minutes Intravenous Every 48 hours 04/12/17 0114 04/12/17 0846   04/13/17 1800  piperacillin-tazobactam (ZOSYN) IVPB 2.25 g  Status:  Discontinued     2.25 g 100 mL/hr over 30 Minutes Intravenous Every 8 hours 04/13/17 1050 04/15/17 0937   04/12/17 0200  piperacillin-tazobactam (ZOSYN) IVPB 3.375 g  Status:  Discontinued     3.375 g 12.5 mL/hr over 240 Minutes Intravenous Every 8 hours 04/12/17 0103 04/13/17 1050   04/12/17 0000  levofloxacin (LEVAQUIN) IVPB 750 mg  Status:  Discontinued     750 mg 100 mL/hr over 90 Minutes Intravenous Every 48 hours 04/11/17 2230 04/12/17 0114      Assessment/Plan:  Acute diverticulitis with perforation. [facility] with drain placement - feculent drainage Transferred due to respiratory failure 04/11/2017.  Earlier hospital course, complicated by DTs, acute respiratory failure with hypoxia, ARDS, aspiration pneumonia and circulatory  shock which required intubation and pressors. Also ARF requiring dialysis.   Lungs are improving. Bilateral thoracentesis has helped.  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   Bilateral pleural effusions.  Successful bilateral thoracentesis 11/23..   Loculated left-sided ascites.600 mL aspirated. Cultures negative  Possible yeast UTI-will give short course of Diflucan  [facility] 04/11/17 - ARDS/pneumonia/COPD Bilateral pleura effusions DT"s - Heavy ETOH use preadmit Hx of tobacco use Anemia Chronic kidney disease - creatinine 2.08 FEN: carb mod/hearthealthy PN:TIRW stopped 11/16, IV zosyn (11/2=>> day 19)..C. difficile negative. DVT: Heparin Foley: - condom cath and  flexiseal..We will remove these and see if he can do without this should help mobility  Plan:continue PT. Antibiotics. Oral nutrition He needs to ambulate more. PT involved.   Colectomy with colostomy this coming week.  Decisions regarding timing of surgery will be made tomorrow most likely               He has been cleared for general anesthesia by Dr. Titus Mould of the pulmonary medicine service  Onprobiotics.      LOS: 29 days    Adin Hector 05/10/2017

## 2017-05-10 NOTE — Progress Notes (Signed)
PROGRESS NOTE    Dominic Phillips  VQM:[id number] DOB: 06-Feb-1961 DOA: 04/11/2017 PCP: No primary care provider on file.  Brief Narrative: 56 year old male with PMH of alcohol abuse presented to [facility] on 04/02/17 with complaints of abdominal pain without fevers, leukocytosis or diarrhea. CT abdomen revealed pericolonic inflammation/fluid collection and he was admitted for diverticular abscess. General surgery was consulted who suggested placement of percutaneous drain by IR. Cultures grew Escherichia coli. Hospital course complicated by DTs, acute respiratory failure with hypoxia/ARDS/aspiration pneumonia and circulatory shock for which he required intubation and pressors, acute kidney injury for which she required CRRT and IHD 1. Shock resolved. Renal functions improving. General surgery continue to follow. Nephrology signed off.   Active Problems:   Acute respiratory distress syndrome (ARDS) (HCC)   Acute kidney injury ([place])   Pressure injury of skin   Colonic diverticular abscess   COPD (chronic obstructive pulmonary disease) (HCC)   Dyspnea   HCAP (healthcare-associated pneumonia)   Acute blood loss anemia   Anemia of chronic disease   ETOH abuse   Tobacco abuse   Tachycardia   Tachypnea   Leukocytosis   Post-operative pain   Acute respiratory failure with hypoxia ([place])   Palliative care by specialist   History of ETT   Pleural effusion   Encounter for imaging study to confirm orogastric (OG) tube placement   Endotracheally intubated   Routine adult health maintenance   Acute respiratory failure with hypoxia/ARDS/VDRF - multifactorial: 2/2 Pulmonary edema vs pleural effusions vs atelectasis vs pneumonia. -weaned down from 15 L/m HFNC oxygen to 3 L/m. Continue weaning  oxygen as tolerated to keep saturations >90%, cotninue incentive spirometry, increase mobilization and activity.  -Required intubation from 10/27 to 11/2 -continue intermittent diuresis as tolerated;  but careful with renal function.  -Follow CXR intermittently; pulmonary service actively following the patient.  We will follow the recommendation for stability prior to surgery.   -s/p thoracentesis on 11/8 (1.3L removed; transudate) -had repeat bilateral thoracentesis on 11/23 (800 ml from left and 1100 ml from right) -Will continue Zosyn  Hypokalemia-- -K 3.8 -will continue to monitor electrolytes and replete as needed -Mg within normal limits after repletion.  Diverticulitis with perforation and abscess, S/P IR drain - Gen. 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 - Continue current IV antibiotics.  - REPEAT CT 05/04/17-No residual fluid collection at site of pigtail drainage catheter in right pelvis. - patient with ongoing fistula; per surgery rec's will need sigmoidectomy and colostomy. -will follow pulmonary and general surgery rec's. -flexiseal removed; loose stools significantly improved/resolved.  Moderate BILATERAL pleural effusions  -with compressive atelectasis of both lower lobes and question right lower lobe consolidation. -patient afebrile and with stable/improved breathing -will continue zosyn for now -repeat CXR with improved ariation, still demonstrating right lower lobe airspace changes and bilateral effusions (both of them improved). -appreciate pulmonary service assistance. Patient s/o post bilateral pleural effusion on 11/23. small left apical pneumothorax appreciated. Patient breathing significantly improved.  -With good oxygen saturation on room air currently.  Acute kidney injury status post CRRT 10/27 and intermittent dialysis. -Renal function improved and stable -renal service sign off -follow intermittent BMET to follow Cr trend  -Cr stable; most recent value demonstrated creatinine of 1.32 -maintain adequate hydration  -HD catheter removed on 11/10  Anemia of chronic  disease.  -Continue iron replacement.  -Status post 1 unit of blood transfusion.  -follow Hgb trend intermittently -no signs of acute  bleeding appreciated -last hemoglobin level 9.7; will check CBC in am  Moderate malnutrition with chronic illness and hx of alcohol abuse- -appreciated nutritional service input -will continue feeding supplements.   DVT prophylaxis.  Heparin Code Status: Partial (no prolonged mechanical ventilation and intubation) Family Communication: no family at bedside  Disposition Plan: Plan is for him to go to inpatient rehab once he is medically stable. CCS anticipating sigmoidectomy and colostomy once fully recover from pulmonary stand point.  Consultants:  General surgery IR PCCM  Procedures: IR drain for acute diverticulitis with perforation.  Antimicrobials: Zosyn  Vitals:   05/09/17 2124 05/10/17 0138 05/10/17 0606 05/10/17 1508  BP: 116/65  117/72 122/80  Pulse: 97  94 88  Resp: 16  20 17   Temp: 98.3 F (36.8 C)  98.1 F (36.7 C) 98.6 F (37 C)  TempSrc: Oral  Oral Oral  SpO2: (!) 88%  94% 97%  Weight:  54.3 kg (119 lb 11.4 oz)    Height:        Intake/Output Summary (Last 24 hours) at 05/10/2017 1648 Last data filed at 05/10/2017 0413 Gross per 24 hour  Intake 200 ml  Output 550 ml  Net -350 ml   Filed Weights   05/08/17 0459 05/09/17 0500 05/10/17 0138  Weight: 58 kg (127 lb 13.9 oz) 58.9 kg (129 lb 13.6 oz) 54.3 kg (119 lb 11.4 oz)    Examination: General exam: No fever, no chest pain, reporting significant improvement in his breathing.  Currently without oxygen supplementation and with good oxygen saturation on room air.  No nausea, no vomiting, just mild left lower quadrant abdominal pain.   Respiratory system: Significant improved, no wheezing, no frank crackles; scattered rhonchi and, normal respiratory effort.  No using accessory muscles and currently with good oxygen saturation on room air.   Cardiovascular system: No  JVD, no murmurs, RRR, no rubs.  Gastrointestinal system: Soft, positive bowel sounds, mild tenderness to palpation in his lower quadrants, percutaneous drain in place still with yellowish feculent drainage.   Central nervous system: Alert, awake and oriented x3, no focal motor deficit.  Cranial nerves intact.  Able to follow commands appropriately. Extremities: No edema, no cyanosis or clubbing. Skin: Percutaneous drain in place with yellowish [place] drainage; no rash, no petechiae, no other open wounds appreciated on exam.    Data Reviewed: I have personally reviewed following labs and imaging studies  CBC: Recent Labs  Lab 05/04/17 0216 05/05/17 0324 05/06/17 0828 05/07/17 0205  WBC 11.7* 12.5* 10.4 11.9*  NEUTROABS 8.0* 8.3*  --   --   HGB 9.3* 9.4* 9.2* 9.7*  HCT 28.8* 29.6* 28.6* 30.6*  MCV 93.5 94.3 95.3 95.3  PLT 438* 508* 491* 326*   Basic Metabolic Panel: Recent Labs  Lab 05/05/17 0324 05/06/17 0828 05/07/17 0205 05/09/17 0334 05/09/17 0830 05/10/17 0428  NA 135 137 138 137  --  138  K 3.8 3.6 3.7 2.8*  --  3.8  CL 97* 101 100* 97*  --  102  CO2 28 26 28 28   --  27  GLUCOSE 99 94 108* 104*  --  99  BUN 35* 30* 31* 29*  --  26*  CREATININE 2.08* 1.87* 1.77* 1.41*  --  1.32*  CALCIUM 8.1* 8.2* 8.3* 8.3*  --  8.5*  MG  --   --   --   --  1.8 2.3   GFR: Estimated Creatinine Clearance: 49.1 mL/min (A) (by C-G formula based on SCr of  1.32 mg/dL (H)).   Liver Function Tests: Recent Labs  Lab 05/04/17 0216 05/08/17 1539  AST 13*  --   ALT 6*  --   ALKPHOS 73  --   BILITOT 0.5  --   PROT 6.4* 6.3*  ALBUMIN 1.7*  --    CBG: Recent Labs  Lab 05/03/17 2141 05/08/17 0808 05/09/17 1211  GLUCAP 91 96 97    Recent Results (from the past 240 hour(s))  Culture, body fluid-bottle     Status: None   Collection Time: 05/05/17  2:49 PM  Result Value Ref Range Status   Specimen Description FLUID PERITONEAL  Final   Special Requests BOTTLES DRAWN AEROBIC AND  ANAEROBIC  Final   Culture NO GROWTH 5 DAYS  Final   Report Status 05/10/2017 FINAL  Final  Gram stain     Status: None   Collection Time: 05/05/17  2:49 PM  Result Value Ref Range Status   Specimen Description FLUID PERITONEAL  Final   Special Requests NONE  Final   Gram Stain   Final    CYTOSPIN SMEAR WBC PRESENT,BOTH PMN AND MONONUCLEAR NO ORGANISMS SEEN    Report Status 05/05/2017 FINAL  Final  Urine Culture     Status: Abnormal   Collection Time: 05/06/17  9:18 AM  Result Value Ref Range Status   Specimen Description URINE, RANDOM  Final   Special Requests NONE  Final   Culture 20,000 COLONIES/mL YEAST (A)  Final   Report Status 05/07/2017 FINAL  Final  C difficile quick scan w PCR reflex     Status: None   Collection Time: 05/06/17  9:18 AM  Result Value Ref Range Status   C Diff antigen NEGATIVE NEGATIVE Final   C Diff toxin NEGATIVE NEGATIVE Final   C Diff interpretation No C. difficile detected.  Final  Gram stain     Status: None   Collection Time: 05/08/17  3:15 PM  Result Value Ref Range Status   Specimen Description PLEURAL  Final   Special Requests RIGHT  Final   Gram Stain   Final    WBC PRESENT, PREDOMINANTLY MONONUCLEAR NO ORGANISMS SEEN CYTOSPIN SMEAR    Report Status 05/09/2017 FINAL  Final  Culture, body fluid-bottle     Status: None (Preliminary result)   Collection Time: 05/08/17  3:15 PM  Result Value Ref Range Status   Specimen Description PLEURAL  Final   Special Requests RIGHT  Final   Culture NO GROWTH 2 DAYS  Final   Report Status PENDING  Incomplete     Radiology Studies: Dg Chest Port 1 View  Result Date: 05/10/2017 CLINICAL DATA:  Pleural effusion. EXAM: PORTABLE CHEST 1 VIEW COMPARISON:  05/09/2017 FINDINGS: The cardiomediastinal silhouette is within normal limits. Confluent, oval density in the right lung base is similar to the prior study and may reflect airspace consolidation or loculated pleural fluid in a fissure. Patchy opacity  more laterally and inferiorly in the right lung base on the prior study has improved. There is mild patchy opacity in the left lung base which is stable to slightly increased. A calcified granuloma is again noted in the left lung base. No pneumothorax is identified. IMPRESSION: Mildly improved aeration of the right lung base with residual consolidation and/or loculated pleural fluid. Stable to slightly increased left basilar atelectasis or infiltrate. Electronically Signed   By: Logan Bores M.D.   On: 05/10/2017 07:32   Dg Chest Port 1 View  Result Date: 05/09/2017 CLINICAL DATA:  Bilateral thoracentesis on 05/08/2017. Evaluate for pleural effusion. EXAM: PORTABLE CHEST 1 VIEW COMPARISON:  05/08/2017 FINDINGS: No definite pneumothorax on this examination. Increased densities in the right lower chest compared to the most recent examination. Heart and mediastinum are within normal limits and stable. The trachea is midline. Calcified granuloma at the left lung base. IMPRESSION: Increased densities in the right lower chest. Findings could represent increasing airspace disease and/or pleural fluid. No pneumothorax. Electronically Signed   By: Markus Daft M.D.   On: 05/09/2017 10:09   Dg Chest Port 1 View  Result Date: 05/08/2017 CLINICAL DATA:  Pleural effusion. Post bilateral thoracentesis earlier this day. EXAM: PORTABLE CHEST 1 VIEW COMPARISON:  Most recent comparison earlier this day at 1500 hour, nearly 4 hours prior. FINDINGS: Unchanged heart size and mediastinal contours. Tiny left apicolateral pneumothorax, less than 5%, not seen on prior exam. No right pneumothorax. Increased patchy and interstitial opacities in both lung bases, right greater than left, may be atelectasis or re-expansion pulmonary edema post thoracentesis. No definite pleural fluid seen on single upright view. Calcified granuloma at the left lung base. IMPRESSION: Tiny left apical and lateral pneumothorax, less than 5%, not seen on  exam earlier this day. Increasing patchy and interstitial bibasilar opacities, right greater than left, may be atelectasis or re-expansion pulmonary edema post thoracentesis ease today. These results were called by telephone at the time of interpretation on 05/08/2017 at 7:32 pm to Dr. Jani Gravel , who verbally acknowledged these results. Electronically Signed   By: Jeb Levering M.D.   On: 05/08/2017 19:32    Scheduled Meds: . feeding supplement (ENSURE ENLIVE)  237 mL Oral QID  . feeding supplement (PRO-STAT SUGAR FREE 64)  30 mL Oral BID  . fluconazole  100 mg Oral Daily  . folic acid  1 mg Oral QHS  . furosemide  20 mg Intravenous Daily  . guaiFENesin  1,200 mg Oral BID  . heparin subcutaneous  5,000 Units Subcutaneous Q8H  . mouth rinse  15 mL Mouth Rinse BID  . multivitamin with minerals  1 tablet Oral Daily  . pantoprazole  40 mg Oral QHS  . potassium chloride  40 mEq Oral Once  . saccharomyces boulardii  250 mg Oral BID  . thiamine  100 mg Oral QHS  . traZODone  50 mg Oral QHS   Continuous Infusions: . sodium chloride    . piperacillin-tazobactam (ZOSYN)  IV Stopped (05/10/17 1522)     LOS: 29 days    Barton Dubois, MD [facility] Pager [phone]  If 7PM-7AM, please contact night-coverage [email or web address] Password [facility] 05/10/2017, 4:48 PM

## 2017-05-10 NOTE — Progress Notes (Signed)
Name: Dominic Phillips MRN: [id number] DOB: 27-Aug-1960    ADMISSION DATE:  04/11/2017 CONSULTATION DATE:  11/23  REFERRING MD :  Dr. Dyann Kief  CHIEF COMPLAINT:  Effusion, pre-op eval  HISTORY OF PRESENT ILLNESS:  56 year old male with PMH etoh abuse and liver abscess, SBO on TPN. He was admitted for diverticular abscess treated with ABX and IR placed drain. Course complicated by ARDS and septic shock. Cultures from drain grew E. Coli. Developed kidney injury requiring CRRT. He improved and was taken off vent, pressors, RRT. Transferred out of ICU 11/3. 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 PCCM consulted for pre-op pulmonary clearance/optimization.   SIGNIFICANT EVENTS  10/18 admit [place] with diverticular abscess 10/23 tx [facility] for sepsis, ARDS, shock.  11/3 off pressors, vent, crrt out of unit 11/8 R thora - transudate by protein  11/23 for OR soon. R effusion. PCCM called back. \ 05/08/2017 right and left thoracentesis performed per Dr. Titus Mould.  STUDIES:  11/19 CT abd > No residual fluid collection at site of pigtail drainage catheter in RIGHT pelvis. Moderate BILATERAL pleural effusions with compressive atelectasis of both lower lobes and question RIGHT lower lobe consolidation. Scattered ascites with question loculated versus 3 collection in the LEFT mid abdomen, appears to be the displacing bowel loops, overall 13.3 x 9.7 x 16.6 cm in size ; this could represent sterile or infected fluid and if an infected collection is suspected consider aspiration.  SUBJECTIVE: No CP, No SOB, neg balance on lasix  VITAL SIGNS: Temp:  [98.1 F (36.7 C)-98.3 F (36.8 C)] 98.1 F (36.7 C) (11/25 0606) Pulse Rate:  [87-97] 94 (11/25 0606) Resp:  [16-20] 20 (11/25 0606) BP: (104-117)/(65-72) 117/72 (11/25 0606) SpO2:  [88 %-96 %] 94 % (11/25 0606) Weight:  [54.3 kg (119 lb 11.4 oz)] 54.3 kg (119 lb 11.4 oz) (11/25  0138)  PHYSICAL EXAMINATION: General: sitting upright end of bed on RA Neuro:mnonfocal HEENT: jvd remains evident PULM: coarse rt base mild, left clear CV: s1 s2 RRR GI: soft, BS low, drain, no r Extremities: low muscle mass    Recent Labs  Lab 05/07/17 0205 05/09/17 0334 05/10/17 0428  NA 138 137 138  K 3.7 2.8* 3.8  CL 100* 97* 102  CO2 28 28 27   BUN 31* 29* 26*  CREATININE 1.77* 1.41* 1.32*  GLUCOSE 108* 104* 99   Recent Labs  Lab 05/05/17 0324 05/06/17 0828 05/07/17 0205  HGB 9.4* 9.2* 9.7*  HCT 29.6* 28.6* 30.6*  WBC 12.5* 10.4 11.9*  PLT 508* 491* 482*  Pleural fluid left Albumin less than 1 Glucose 89 LD fluid 77 Total protein 3.1 PH 7.6 Color straw-colored WBCs to 93 Dg Chest Port 1 View  Result Date: 05/10/2017 CLINICAL DATA:  Pleural effusion. EXAM: PORTABLE CHEST 1 VIEW COMPARISON:  05/09/2017 FINDINGS: The cardiomediastinal silhouette is within normal limits. Confluent, oval density in the right lung base is similar to the prior study and may reflect airspace consolidation or loculated pleural fluid in a fissure. Patchy opacity more laterally and inferiorly in the right lung base on the prior study has improved. There is mild patchy opacity in the left lung base which is stable to slightly increased. A calcified granuloma is again noted in the left lung base. No pneumothorax is identified. IMPRESSION: Mildly improved aeration of the right lung base with residual consolidation and/or loculated pleural fluid. Stable to slightly increased left basilar atelectasis or infiltrate. Electronically  Signed   By: Logan Bores M.D.   On: 05/10/2017 07:32   Dg Chest Port 1 View  Result Date: 05/09/2017 CLINICAL DATA:  Bilateral thoracentesis on 05/08/2017. Evaluate for pleural effusion. EXAM: PORTABLE CHEST 1 VIEW COMPARISON:  05/08/2017 FINDINGS: No definite pneumothorax on this examination. Increased densities in the right lower chest compared to the most recent  examination. Heart and mediastinum are within normal limits and stable. The trachea is midline. Calcified granuloma at the left lung base. IMPRESSION: Increased densities in the right lower chest. Findings could represent increasing airspace disease and/or pleural fluid. No pneumothorax. Electronically Signed   By: Markus Daft M.D.   On: 05/09/2017 10:09   Dg Chest Port 1 View  Result Date: 05/08/2017 CLINICAL DATA:  Pleural effusion. Post bilateral thoracentesis earlier this day. EXAM: PORTABLE CHEST 1 VIEW COMPARISON:  Most recent comparison earlier this day at 1500 hour, nearly 4 hours prior. FINDINGS: Unchanged heart size and mediastinal contours. Tiny left apicolateral pneumothorax, less than 5%, not seen on prior exam. No right pneumothorax. Increased patchy and interstitial opacities in both lung bases, right greater than left, may be atelectasis or re-expansion pulmonary edema post thoracentesis. No definite pleural fluid seen on single upright view. Calcified granuloma at the left lung base. IMPRESSION: Tiny left apical and lateral pneumothorax, less than 5%, not seen on exam earlier this day. Increasing patchy and interstitial bibasilar opacities, right greater than left, may be atelectasis or re-expansion pulmonary edema post thoracentesis ease today. These results were called by telephone at the time of interpretation on 05/08/2017 at 7:32 pm to Dr. Jani Gravel , who verbally acknowledged these results. Electronically Signed   By: Jeb Levering M.D.   On: 05/08/2017 19:32   Dg Chest Port 1 View  Result Date: 05/08/2017 CLINICAL DATA:  Status post left thoracentesis EXAM: PORTABLE CHEST 1 VIEW COMPARISON:  05/08/2017 at 1413 hours FINDINGS: No pneumothorax is seen status post left thoracentesis. Mild right basilar atelectasis/scarring. Suspected trace right pleural effusion. Calcified granuloma at the lateral left lung base. The heart is normal in size. IMPRESSION: No pneumothorax is seen status  post left thoracentesis. These results were called by telephone at the time of interpretation on 05/08/2017 at 3:15 pm to Dr. Heber [place], who verbally acknowledged these results. Electronically Signed   By: Julian Hy M.D.   On: 05/08/2017 15:15   Dg Chest Port 1 View  Result Date: 05/08/2017 CLINICAL DATA:  Right-sided thoracentesis. EXAM: PORTABLE CHEST 1 VIEW COMPARISON:  05/08/2017 at 0655 hours. FINDINGS: Marked decrease in right-sided pleural effusion since thoracentesis with trace fluid blunting the right lateral costophrenic angle. No pneumothorax. Heart is top-normal in size. No aortic aneurysm. Stable granuloma at the left lung base. There is platelike atelectasis in the right lower lobe. No acute osseous abnormality. IMPRESSION: Near complete resolution of right-sided pleural effusion status post thoracentesis. Atelectasis is noted at the right lung base. No pneumothorax is visualized Electronically Signed   By: Ashley Royalty M.D.   On: 05/08/2017 14:40    ASSESSMENT / PLAN:  R sided pleural effusion, recurrent. Has had thora done 11/8 and looked to be transudate by Lights criteria. Effusion has since reoccurred. S/p bilateral thoracentesis about 2 liters off total Plan -pcxr which I reviewed this am shows improved rt base int changes - I favor these changes on right to be mild re expansion pulm edema and atx -continued IS -continued lasix to neg balance daily -mobilize as able -he has NO secretions, no  role flutter Would repeat pcxr tues, his clinical status is good -there is no complicating feature present after left thora  Lavon Paganini. Titus Mould, MD, [place] Pgr: [facility] 05/10/2017 2:07 PM

## 2017-05-11 ENCOUNTER — Inpatient Hospital Stay (HOSPITAL_COMMUNITY): Payer: Medicaid Other

## 2017-05-11 DIAGNOSIS — K651 Peritoneal abscess: Secondary | ICD-10-CM

## 2017-05-11 DIAGNOSIS — Z9889 Other specified postprocedural states: Secondary | ICD-10-CM

## 2017-05-11 LAB — BASIC METABOLIC PANEL
Anion gap: 10 (ref 5–15)
BUN: 26 mg/dL — AB (ref 6–20)
CHLORIDE: 99 mmol/L — AB (ref 101–111)
CO2: 28 mmol/L (ref 22–32)
Calcium: 8.7 mg/dL — ABNORMAL LOW (ref 8.9–10.3)
Creatinine, Ser: 1.2 mg/dL (ref 0.61–1.24)
GFR calc Af Amer: >60 mL/min (ref >60)
GFR calc non Af Amer: >60 mL/min (ref >60)
GLUCOSE: 111 mg/dL — AB (ref 65–99)
POTASSIUM: 3.5 mmol/L (ref 3.5–5.1)
Sodium: 137 mmol/L (ref 135–145)

## 2017-05-11 NOTE — Progress Notes (Signed)
Inpatient Rehabilitation  Continuing to follow for timing of medical readiness, note plans for surgery sometimes this week, and patient's decision as well as IP Rehab bed availability.  Call if questions.   Carmelia Roller., CCC/SLP Admission Coordinator  [place]  Cell [phone]

## 2017-05-11 NOTE — Progress Notes (Signed)
Name: Dominic Phillips MRN: [id number] DOB: [birth year]    ADMISSION DATE:  04/11/2017 CONSULTATION DATE:  11/23  REFERRING MD :  Dr. Dyann Kief  CHIEF COMPLAINT:  Effusion, pre-op eval  HISTORY OF PRESENT ILLNESS:  56 year old male with PMH etoh abuse and liver abscess, SBO on TPN. He was admitted for diverticular abscess treated with ABX and IR placed drain. Course complicated by ARDS and septic shock. Cultures from drain grew E. Coli. Developed kidney injury requiring CRRT. He improved and was taken off vent, pressors, RRT. Transferred out of ICU 11/3. 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 PCCM consulted for pre-op pulmonary clearance/optimization.   SIGNIFICANT EVENTS  10/18 admit [place] with diverticular abscess 10/23 tx [facility] for sepsis, ARDS, shock.  11/3 off pressors, vent, crrt out of unit 11/8 R thora - transudate by protein  11/23 for OR soon. R effusion. PCCM called back. \ 05/08/2017 right and left thoracentesis performed per Dr. Titus Mould.  STUDIES:  11/19 CT abd > No residual fluid collection at site of pigtail drainage catheter in RIGHT pelvis. Moderate BILATERAL pleural effusions with compressive atelectasis of both lower lobes and question RIGHT lower lobe consolidation. Scattered ascites with question loculated versus 3 collection in the LEFT mid abdomen, appears to be the displacing bowel loops, overall 13.3 x 9.7 x 16.6 cm in size ; this could represent sterile or infected fluid and if an infected collection is suspected consider aspiration.  SUBJECTIVE: On RA No distress   VITAL SIGNS: Temp:  [98.1 F (36.7 C)-98.7 F (37.1 C)] 98.7 F (37.1 C) (11/26 1740) Pulse Rate:  [85-94] 94 (11/26 0638) Resp:  [17-20] 17 (11/26 8144) BP: (118-124)/(67-80) 118/67 (11/26 8185) SpO2:  [96 %-97 %] 96 % (11/26 6314) Weight:  [51.9 kg (114 lb 6.7 oz)] 51.9 kg (114 lb 6.7 oz) (11/26 0044)  PHYSICAL  EXAMINATION: General: awake, talkative Neuro: O x 3, nonfocal HEENT: jvd present still PULM: CTA resolving coarse rt CV:  s1 s2 RRR GI: soft, BS low, drain Extremities:  No edema     Recent Labs  Lab 05/09/17 0334 05/10/17 0428 05/11/17 0402  NA 137 138 137  K 2.8* 3.8 3.5  CL 97* 102 99*  CO2 28 27 28   BUN 29* 26* 26*  CREATININE 1.41* 1.32* 1.20  GLUCOSE 104* 99 111*   Recent Labs  Lab 05/05/17 0324 05/06/17 0828 05/07/17 0205  HGB 9.4* 9.2* 9.7*  HCT 29.6* 28.6* 30.6*  WBC 12.5* 10.4 11.9*  PLT 508* 491* 482*  Pleural fluid left Albumin less than 1 Glucose 89 LD fluid 77 Total protein 3.1 PH 7.6 Color straw-colored WBCs to 93 Dg Chest Port 1 View  Result Date: 05/10/2017 CLINICAL DATA:  Pleural effusion. EXAM: PORTABLE CHEST 1 VIEW COMPARISON:  05/09/2017 FINDINGS: The cardiomediastinal silhouette is within normal limits. Confluent, oval density in the right lung base is similar to the prior study and may reflect airspace consolidation or loculated pleural fluid in a fissure. Patchy opacity more laterally and inferiorly in the right lung base on the prior study has improved. There is mild patchy opacity in the left lung base which is stable to slightly increased. A calcified granuloma is again noted in the left lung base. No pneumothorax is identified. IMPRESSION: Mildly improved aeration of the right lung base with residual consolidation and/or loculated pleural fluid. Stable to slightly increased left basilar atelectasis or infiltrate. Electronically Signed   By: Zenia Resides  Jeralyn Ruths M.D.   On: 05/10/2017 07:32    ASSESSMENT / PLAN:  R sided pleural effusion, recurrent. Has had thora done 11/8 and looked to be transudate by Lights criteria. Effusion has since reoccurred. S/p bilateral thoracentesis about 2 liters off total Post tap re expansion edema / ATX Plan He is on RA No distress Repeat pcxr with IS and was neg 1.7  Liters IS Will assess above pcxr , if  abnormal concerns will address If improving, will re assess post op seems renal fxn continued to tolerate current lasix dose, maintain as tolerated  Lavon Paganini. Titus Mould, MD, FACP Pgr: [place]. Titus Mould, MD, [place] Pgr: [facility] 05/11/2017 2:15 PM

## 2017-05-11 NOTE — Progress Notes (Signed)
Patient ID: Dominic Phillips, male   DOB: Jul 27, 1960, 56 y.o.   MRN: [id number]  PROGRESS NOTE    Dominic Phillips  OVF:[id number] DOB: 11/24/1960 DOA: 04/11/2017 PCP: No primary care provider on file.   Brief Narrative:  56 year old male with past medical history of alcohol abuse presented to [facility] on 04/02/2017 with abdominal pain without fevers or leukocytosis. CT abdomen revealed pericolonic inflammation/fluid collection and he was admitted for diverticular abscess. General surgery was consulted who suggested placement of percutaneous drain by IR. Cultures grew Escherichia coli. Hospital course complicated by DTs, acute respiratory failure with hypoxia/ARDS/aspiration pneumonia and circulatory shock for which he required intubation and pressors; acute kidney injury for which she required CRRT and hemodialysis 1. Shock has resolved. Currently extubated and on room air. Renal function is improving. Nephrology signed off. Gen. surgery continues to follow.  Assessment & Plan:   Active Problems:   Acute respiratory distress syndrome (ARDS) (HCC)   Acute kidney injury ([place])   Pressure injury of skin   Colonic diverticular abscess   COPD (chronic obstructive pulmonary disease) (HCC)   Dyspnea   HCAP (healthcare-associated pneumonia)   Acute blood loss anemia   Anemia of chronic disease   ETOH abuse   Tobacco abuse   Tachycardia   Tachypnea   Leukocytosis   Post-operative pain   Acute respiratory failure with hypoxia ([place])   Palliative care by specialist   History of ETT   Pleural effusion   Encounter for imaging study to confirm orogastric (OG) tube placement   Endotracheally intubated   Routine adult health maintenance   Acute hypoxic respiratory failure/ARDS with bilateral pleural effusions - Multifactorial second to pulmonary edema versus pleural effusions versus atelectasis versus pneumonia - Currently on room air. Pulmonary following. - Required intubation from  04/11/2017-04/17/17 -Status post thoracentesis on 04/23/2017 with removal of 1.3 L fluid and bilateral thoracentesis on 05/08/2017 with removal of 800 mL from left and 1100 mL from right - Follow chest x-ray intermittently  Diverticulitis with perforation and abscess status post IR drain - General surgery following. Continue Zosyn. 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 - REPEAT CT 05/04/17-No residual fluid collection at site of pigtail drainage catheter in right pelvis.  Acute kidney injury status post CRRT on 04/11/2017 and intermittent dialysis -Renal function improved and stable. Nephrology has signed off -Follow renal function intermittently -HD catheter removed on 04/25/2017  Anemia of chronic disease - Status post 1 unit of packed red cells transfusion -Continue iron replacement -Monitor hemoglobin -No signs of acute bleeding  Moderate malnutrition of chronic illness -Follow nutrition recommendations   DVT prophylaxis: Heparin Code Status:  Partial with no prolonged mechanical ventilation and intubation Family Communication: None at bedside Disposition Plan: Depends on clinical outcome  Consultants:  General surgery IR PCCM   Procedures: IR drain in [facility] on 04/02/2017 CRRT Hemodialysis 1 Removal of HD dialysis catheter Intubation and extubation from 04/11/2017-04/17/17 Thoracentesis 2  Antimicrobials:  Anti-infectives (From admission, onward)   Start     Dose/Rate Route Frequency Ordered Stop   05/08/17 1000  fluconazole (DIFLUCAN) tablet 100 mg     100 mg Oral Daily 05/08/17 0603 05/13/17 0959   05/04/17 0130  piperacillin-tazobactam (ZOSYN) IVPB 3.375 g     3.375 g 12.5 mL/hr over 240 Minutes Intravenous Every 8 hours 05/03/17 2211     04/28/17 1530  vancomycin (VANCOCIN) IVPB 750 mg/150 ml premix  Status:  Discontinued     750 mg 150  mL/hr over 60 Minutes Intravenous Every 24 hours 04/28/17 1424 05/01/17 1128   04/27/17 1400  piperacillin-tazobactam (ZOSYN) IVPB 3.375 g  Status:  Discontinued     3.375 g 12.5 mL/hr over 240 Minutes Intravenous Every 8 hours 04/27/17 1158 05/03/17 2211   04/26/17 1830  vancomycin (VANCOCIN) 1,250 mg in sodium chloride 0.9 % 250 mL IVPB  Status:  Discontinued     1,250 mg 166.7 mL/hr over 90 Minutes Intravenous Every 48 hours 04/26/17 1739 04/28/17 1424   04/24/17 1700  vancomycin (VANCOCIN) IVPB 1000 mg/200 mL premix  Status:  Discontinued     1,000 mg 200 mL/hr over 60 Minutes Intravenous  Once 04/24/17 1652 04/24/17 1656   04/24/17 1700  vancomycin (VANCOCIN) IVPB 1000 mg/200 mL premix  Status:  Discontinued     1,000 mg 200 mL/hr over 60 Minutes Intravenous Every 48 hours 04/24/17 1656 04/26/17 1739   04/23/17 1500  piperacillin-tazobactam (ZOSYN) IVPB 3.375 g  Status:  Discontinued     3.375 g 12.5 mL/hr over 240 Minutes Intravenous Every 12 hours 04/23/17 1107 04/27/17 1158   04/18/17 1800  piperacillin-tazobactam (ZOSYN) IVPB 3.375 g  Status:  Discontinued     3.375 g 12.5 mL/hr over 240 Minutes Intravenous Every 12 hours 04/18/17 1032 04/23/17 1107   04/18/17 1400  piperacillin-tazobactam (ZOSYN) IVPB 3.375 g  Status:  Discontinued     3.375 g 12.5 mL/hr over 240 Minutes Intravenous Every 8 hours 04/18/17 1031 04/18/17 1032   04/17/17 1200  piperacillin-tazobactam (ZOSYN) IVPB 3.375 g  Status:  Discontinued     3.375 g 100 mL/hr over 30 Minutes Intravenous Every 6 hours 04/17/17 1030 04/18/17 1031   04/17/17 1100  piperacillin-tazobactam (ZOSYN) IVPB 3.375 g  Status:  Discontinued     3.375 g 12.5 mL/hr over 240 Minutes Intravenous Every 8 hours 04/17/17 1019 04/17/17 1030   04/15/17 2125  Ampicillin-Sulbactam (UNASYN) 3 g in sodium chloride 0.9 % 100 mL IVPB  Status:  Discontinued     3 g 200 mL/hr over 30 Minutes Intravenous Every 8 hours 04/15/17 1529 04/17/17 1019    04/15/17 1100  ampicillin-sulbactam (UNASYN) 1.5 g in sodium chloride 0.9 % 50 mL IVPB  Status:  Discontinued     1.5 g 100 mL/hr over 30 Minutes Intravenous Every 12 hours 04/15/17 0937 04/15/17 1529   04/13/17 2200  levofloxacin (LEVAQUIN) IVPB 500 mg  Status:  Discontinued     500 mg 100 mL/hr over 60 Minutes Intravenous Every 48 hours 04/12/17 0114 04/12/17 0846   04/13/17 1800  piperacillin-tazobactam (ZOSYN) IVPB 2.25 g  Status:  Discontinued     2.25 g 100 mL/hr over 30 Minutes Intravenous Every 8 hours 04/13/17 1050 04/15/17 0937   04/12/17 0200  piperacillin-tazobactam (ZOSYN) IVPB 3.375 g  Status:  Discontinued     3.375 g 12.5 mL/hr over 240 Minutes Intravenous Every 8 hours 04/12/17 0103 04/13/17 1050   04/12/17 0000  levofloxacin (LEVAQUIN) IVPB 750 mg  Status:  Discontinued     750 mg 100 mL/hr over 90 Minutes Intravenous Every 48 hours 04/11/17 2230 04/12/17 0114       Subjective: Patient seen and examined at bedside. He denies any overnight fever, nausea or vomiting.  Objective: Vitals:   05/10/17 1508 05/10/17 2217 05/11/17 0044 05/11/17 0638  BP: 122/80 124/76  118/67  Pulse: 88 85  94  Resp: 17 20  17   Temp: 98.6 F (37 C) 98.1  F (36.7 C)  98.7 F (37.1 C)  TempSrc: Oral   Oral  SpO2: 97% 97%  96%  Weight:   51.9 kg (114 lb 6.7 oz)   Height:        Intake/Output Summary (Last 24 hours) at 05/11/2017 1320 Last data filed at 05/11/2017 1048 Gross per 24 hour  Intake 50 ml  Output 2425 ml  Net -2375 ml   Filed Weights   05/09/17 0500 05/10/17 0138 05/11/17 0044  Weight: 58.9 kg (129 lb 13.6 oz) 54.3 kg (119 lb 11.4 oz) 51.9 kg (114 lb 6.7 oz)    Examination:  General exam: Appears calm and comfortable  Respiratory system: Bilateral decreased breath sound at bases Cardiovascular system: S1 & S2 heard, rate controlled  Gastrointestinal system: Abdomen is nondistended, soft and nontender. Normal bowel sounds heard. Extremities: No cyanosis,  clubbing, edema   Data Reviewed: I have personally reviewed following labs and imaging studies  CBC: Recent Labs  Lab 05/05/17 0324 05/06/17 0828 05/07/17 0205  WBC 12.5* 10.4 11.9*  NEUTROABS 8.3*  --   --   HGB 9.4* 9.2* 9.7*  HCT 29.6* 28.6* 30.6*  MCV 94.3 95.3 95.3  PLT 508* 491* 025*   Basic Metabolic Panel: Recent Labs  Lab 05/06/17 0828 05/07/17 0205 05/09/17 0334 05/09/17 0830 05/10/17 0428 05/11/17 0402  NA 137 138 137  --  138 137  K 3.6 3.7 2.8*  --  3.8 3.5  CL 101 100* 97*  --  102 99*  CO2 26 28 28   --  27 28  GLUCOSE 94 108* 104*  --  99 111*  BUN 30* 31* 29*  --  26* 26*  CREATININE 1.87* 1.77* 1.41*  --  1.32* 1.20  CALCIUM 8.2* 8.3* 8.3*  --  8.5* 8.7*  MG  --   --   --  1.8 2.3  --    GFR: Estimated Creatinine Clearance: 51.7 mL/min (by C-G formula based on SCr of 1.2 mg/dL). Liver Function Tests: Recent Labs  Lab 05/08/17 1539  PROT 6.3*   No results for input(s): LIPASE, AMYLASE in the last 168 hours. No results for input(s): AMMONIA in the last 168 hours. Coagulation Profile: No results for input(s): INR, PROTIME in the last 168 hours. Cardiac Enzymes: No results for input(s): CKTOTAL, CKMB, CKMBINDEX, TROPONINI in the last 168 hours. BNP (last 3 results) No results for input(s): PROBNP in the last 8760 hours. HbA1C: No results for input(s): HGBA1C in the last 72 hours. CBG: Recent Labs  Lab 05/08/17 0808 05/09/17 1211  GLUCAP 96 97   Lipid Profile: No results for input(s): CHOL, HDL, LDLCALC, TRIG, CHOLHDL, LDLDIRECT in the last 72 hours. Thyroid Function Tests: No results for input(s): TSH, T4TOTAL, FREET4, T3FREE, THYROIDAB in the last 72 hours. Anemia Panel: No results for input(s): VITAMINB12, FOLATE, FERRITIN, TIBC, IRON, RETICCTPCT in the last 72 hours. Sepsis Labs: No results for input(s): PROCALCITON, LATICACIDVEN in the last 168 hours.  Recent Results (from the past 240 hour(s))  Culture, body fluid-bottle      Status: None   Collection Time: 05/05/17  2:49 PM  Result Value Ref Range Status   Specimen Description FLUID PERITONEAL  Final   Special Requests BOTTLES DRAWN AEROBIC AND ANAEROBIC  Final   Culture NO GROWTH 5 DAYS  Final   Report Status 05/10/2017 FINAL  Final  Gram stain     Status: None   Collection Time: 05/05/17  2:49 PM  Result Value Ref Range  Status   Specimen Description FLUID PERITONEAL  Final   Special Requests NONE  Final   Gram Stain   Final    CYTOSPIN SMEAR WBC PRESENT,BOTH PMN AND MONONUCLEAR NO ORGANISMS SEEN    Report Status 05/05/2017 FINAL  Final  Urine Culture     Status: Abnormal   Collection Time: 05/06/17  9:18 AM  Result Value Ref Range Status   Specimen Description URINE, RANDOM  Final   Special Requests NONE  Final   Culture 20,000 COLONIES/mL YEAST (A)  Final   Report Status 05/07/2017 FINAL  Final  C difficile quick scan w PCR reflex     Status: None   Collection Time: 05/06/17  9:18 AM  Result Value Ref Range Status   C Diff antigen NEGATIVE NEGATIVE Final   C Diff toxin NEGATIVE NEGATIVE Final   C Diff interpretation No C. difficile detected.  Final  Gram stain     Status: None   Collection Time: 05/08/17  3:15 PM  Result Value Ref Range Status   Specimen Description PLEURAL  Final   Special Requests RIGHT  Final   Gram Stain   Final    WBC PRESENT, PREDOMINANTLY MONONUCLEAR NO ORGANISMS SEEN CYTOSPIN SMEAR    Report Status 05/09/2017 FINAL  Final  Culture, body fluid-bottle     Status: None (Preliminary result)   Collection Time: 05/08/17  3:15 PM  Result Value Ref Range Status   Specimen Description PLEURAL  Final   Special Requests RIGHT  Final   Culture NO GROWTH 2 DAYS  Final   Report Status PENDING  Incomplete         Radiology Studies: Dg Chest Port 1 View  Result Date: 05/10/2017 CLINICAL DATA:  Pleural effusion. EXAM: PORTABLE CHEST 1 VIEW COMPARISON:  05/09/2017 FINDINGS: The cardiomediastinal silhouette is within  normal limits. Confluent, oval density in the right lung base is similar to the prior study and may reflect airspace consolidation or loculated pleural fluid in a fissure. Patchy opacity more laterally and inferiorly in the right lung base on the prior study has improved. There is mild patchy opacity in the left lung base which is stable to slightly increased. A calcified granuloma is again noted in the left lung base. No pneumothorax is identified. IMPRESSION: Mildly improved aeration of the right lung base with residual consolidation and/or loculated pleural fluid. Stable to slightly increased left basilar atelectasis or infiltrate. Electronically Signed   By: Logan Bores M.D.   On: 05/10/2017 07:32        Scheduled Meds: . feeding supplement (ENSURE ENLIVE)  237 mL Oral QID  . feeding supplement (PRO-STAT SUGAR FREE 64)  30 mL Oral BID  . fluconazole  100 mg Oral Daily  . folic acid  1 mg Oral QHS  . guaiFENesin  1,200 mg Oral BID  . heparin subcutaneous  5,000 Units Subcutaneous Q8H  . mouth rinse  15 mL Mouth Rinse BID  . multivitamin with minerals  1 tablet Oral Daily  . pantoprazole  40 mg Oral QHS  . saccharomyces boulardii  250 mg Oral BID  . thiamine  100 mg Oral QHS  . traZODone  50 mg Oral QHS   Continuous Infusions: . sodium chloride    . piperacillin-tazobactam (ZOSYN)  IV Stopped (05/11/17 1246)     LOS: 30 days        Aline August, MD [facility] Pager [phone]  If 7PM-7AM, please contact night-coverage [email or web address] Password [email or web address] 05/11/2017, 1:20 PM

## 2017-05-11 NOTE — Progress Notes (Signed)
[facility] Progress Note     Subjective: CC:  Pain controlled. Tolerating PO but still having poor oral intake. Denies CP/SOB and feels lungs improved s/p thora.   Objective: Vital signs in last 24 hours: Temp:  [98.1 F (36.7 C)-98.7 F (37.1 C)] 98.7 F (37.1 C) (11/26 3295) Pulse Rate:  [85-94] 94 (11/26 0638) Resp:  [17-20] 17 (11/26 1884) BP: (118-124)/(67-80) 118/67 (11/26 1660) SpO2:  [96 %-97 %] 96 % (11/26 6301) Weight:  [51.9 kg (114 lb 6.7 oz)] 51.9 kg (114 lb 6.7 oz) (11/26 0044) Last BM Date: 05/11/17  Intake/Output from previous day: 11/25 0701 - 11/26 0700 In: -  Out: 6010 [Urine:1700; Drains:25] Intake/Output this shift: Total I/O In: -  Out: 100 [Urine:100]  PE: Gen:  Alert, NAD, pleasant, cachectic  Card:  Regular rate and rhythm, pedal pulses 2+ BL Pulm:  Normal effort, CTAB with diminished breath sounds right lung base and some crackles left lung base. Abd: Soft, non-tender, mild distention, drain with feculent output. Skin: warm and dry, no rashes  Psych: A&Ox3   Lab Results:  No results for input(s): WBC, HGB, HCT, PLT in the last 72 hours. BMET Recent Labs    05/10/17 0428 05/11/17 0402  NA 138 137  K 3.8 3.5  CL 102 99*  CO2 27 28  GLUCOSE 99 111*  BUN 26* 26*  CREATININE 1.32* 1.20  CALCIUM 8.5* 8.7*   PT/INR No results for input(s): LABPROT, INR in the last 72 hours. CMP     Component Value Date/Time   NA 137 05/11/2017 0402   K 3.5 05/11/2017 0402   CL 99 (L) 05/11/2017 0402   CO2 28 05/11/2017 0402   GLUCOSE 111 (H) 05/11/2017 0402   BUN 26 (H) 05/11/2017 0402   CREATININE 1.20 05/11/2017 0402   CALCIUM 8.7 (L) 05/11/2017 0402   PROT 6.3 (L) 05/08/2017 1539   ALBUMIN 1.7 (L) 05/04/2017 0216   AST 13 (L) 05/04/2017 0216   ALT 6 (L) 05/04/2017 0216   ALKPHOS 73 05/04/2017 0216   BILITOT 0.5 05/04/2017 0216   GFRNONAA >60 05/11/2017 0402   GFRAA >60 05/11/2017 0402   Lipase  No results found for:  LIPASE     Studies/Results: Dg Chest Port 1 View  Result Date: 05/10/2017 CLINICAL DATA:  Pleural effusion. EXAM: PORTABLE CHEST 1 VIEW COMPARISON:  05/09/2017 FINDINGS: The cardiomediastinal silhouette is within normal limits. Confluent, oval density in the right lung base is similar to the prior study and may reflect airspace consolidation or loculated pleural fluid in a fissure. Patchy opacity more laterally and inferiorly in the right lung base on the prior study has improved. There is mild patchy opacity in the left lung base which is stable to slightly increased. A calcified granuloma is again noted in the left lung base. No pneumothorax is identified. IMPRESSION: Mildly improved aeration of the right lung base with residual consolidation and/or loculated pleural fluid. Stable to slightly increased left basilar atelectasis or infiltrate. Electronically Signed   By: Logan Bores M.D.   On: 05/10/2017 07:32    Anti-infectives: Anti-infectives (From admission, onward)   Start     Dose/Rate Route Frequency Ordered Stop   05/08/17 1000  fluconazole (DIFLUCAN) tablet 100 mg     100 mg Oral Daily 05/08/17 0603 05/13/17 0959   05/04/17 0130  piperacillin-tazobactam (ZOSYN) IVPB 3.375 g     3.375 g 12.5 mL/hr over 240 Minutes Intravenous Every 8 hours 05/03/17 2211  04/28/17 1530  vancomycin (VANCOCIN) IVPB 750 mg/150 ml premix  Status:  Discontinued     750 mg 150 mL/hr over 60 Minutes Intravenous Every 24 hours 04/28/17 1424 05/01/17 1128   04/27/17 1400  piperacillin-tazobactam (ZOSYN) IVPB 3.375 g  Status:  Discontinued     3.375 g 12.5 mL/hr over 240 Minutes Intravenous Every 8 hours 04/27/17 1158 05/03/17 2211   04/26/17 1830  vancomycin (VANCOCIN) 1,250 mg in sodium chloride 0.9 % 250 mL IVPB  Status:  Discontinued     1,250 mg 166.7 mL/hr over 90 Minutes Intravenous Every 48 hours 04/26/17 1739 04/28/17 1424   04/24/17 1700  vancomycin (VANCOCIN) IVPB 1000 mg/200 mL premix   Status:  Discontinued     1,000 mg 200 mL/hr over 60 Minutes Intravenous  Once 04/24/17 1652 04/24/17 1656   04/24/17 1700  vancomycin (VANCOCIN) IVPB 1000 mg/200 mL premix  Status:  Discontinued     1,000 mg 200 mL/hr over 60 Minutes Intravenous Every 48 hours 04/24/17 1656 04/26/17 1739   04/23/17 1500  piperacillin-tazobactam (ZOSYN) IVPB 3.375 g  Status:  Discontinued     3.375 g 12.5 mL/hr over 240 Minutes Intravenous Every 12 hours 04/23/17 1107 04/27/17 1158   04/18/17 1800  piperacillin-tazobactam (ZOSYN) IVPB 3.375 g  Status:  Discontinued     3.375 g 12.5 mL/hr over 240 Minutes Intravenous Every 12 hours 04/18/17 1032 04/23/17 1107   04/18/17 1400  piperacillin-tazobactam (ZOSYN) IVPB 3.375 g  Status:  Discontinued     3.375 g 12.5 mL/hr over 240 Minutes Intravenous Every 8 hours 04/18/17 1031 04/18/17 1032   04/17/17 1200  piperacillin-tazobactam (ZOSYN) IVPB 3.375 g  Status:  Discontinued     3.375 g 100 mL/hr over 30 Minutes Intravenous Every 6 hours 04/17/17 1030 04/18/17 1031   04/17/17 1100  piperacillin-tazobactam (ZOSYN) IVPB 3.375 g  Status:  Discontinued     3.375 g 12.5 mL/hr over 240 Minutes Intravenous Every 8 hours 04/17/17 1019 04/17/17 1030   04/15/17 2125  Ampicillin-Sulbactam (UNASYN) 3 g in sodium chloride 0.9 % 100 mL IVPB  Status:  Discontinued     3 g 200 mL/hr over 30 Minutes Intravenous Every 8 hours 04/15/17 1529 04/17/17 1019   04/15/17 1100  ampicillin-sulbactam (UNASYN) 1.5 g in sodium chloride 0.9 % 50 mL IVPB  Status:  Discontinued     1.5 g 100 mL/hr over 30 Minutes Intravenous Every 12 hours 04/15/17 0937 04/15/17 1529   04/13/17 2200  levofloxacin (LEVAQUIN) IVPB 500 mg  Status:  Discontinued     500 mg 100 mL/hr over 60 Minutes Intravenous Every 48 hours 04/12/17 0114 04/12/17 0846   04/13/17 1800  piperacillin-tazobactam (ZOSYN) IVPB 2.25 g  Status:  Discontinued     2.25 g 100 mL/hr over 30 Minutes Intravenous Every 8 hours 04/13/17 1050  04/15/17 0937   04/12/17 0200  piperacillin-tazobactam (ZOSYN) IVPB 3.375 g  Status:  Discontinued     3.375 g 12.5 mL/hr over 240 Minutes Intravenous Every 8 hours 04/12/17 0103 04/13/17 1050   04/12/17 0000  levofloxacin (LEVAQUIN) IVPB 750 mg  Status:  Discontinued     750 mg 100 mL/hr over 90 Minutes Intravenous Every 48 hours 04/11/17 2230 04/12/17 0114     Assessment/Plan Acute diverticulitis with perforation. - [facility] diverticulitis with drain placement - feculent drainage - Transferred due to respiratory failure 04/11/2017. - Earlier hospital course, complicated by DTs, acute respiratory failure with hypoxia, ARDS, aspiration pneumonia and circulatory shock which required  intubation and pressors. Also ARF requiring dialysis. Lungs are improving. 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  - CCM recommending repeat CXR tomorrow (tues). I think that if pulmonary status remains stable we can plan for Hartmann's Wed/thurs of this week. Will discuss final timing of surgery with Dr. Donne Hazel. Per Dr. Darrel Hoover note 11/25, CCM has cleared pt for surgery from pulmonary standpoint.  pleural effusions - Successful bilateral thoracentesis 11/23; CCM recs repeat CXR tomorrow 11/27.  Loculated left-sided ascites.600 mL aspirated. Cultures negative Possible yeast UTI Hx of tobacco use Anemia Chronic kidney disease - creatinine now normalized 1.2 FEN: carb mod/hearthealthy XH:BZJI stopped 11/16, IV zosyn (11/2=>>day 20).Diflucan 11/25 for possible yeast UTI. C. difficile negative. DVT: Heparin Foley: - condom cath and flexiseal. Attempt removal    Plan:continue PT and increase physical activity as able. Continue antibiotics. Plan for surgery this week, will discuss time of surgery with MD.   LOS: 81 days    Jill Alexanders , [facility] 05/11/2017, 10:25  AM Pager: [phone] Consults: [phone] Mon-Fri 7:00 am-4:30 pm Sat-Sun 7:00 am-11:30 am

## 2017-05-11 NOTE — Progress Notes (Signed)
Physical Therapy Treatment Patient Details Name: Dominic Phillips MRN: [id number] DOB: [birth year] Today's Date: 05/11/2017    History of Present Illness Pt admitted to [facility] and transferred on 04/02/17 with c/o abdominal pain.  Pt presented with ETOH withdrawls, liver abscess and SBO on TPN.  10/23 presents with hypoxia and increased WBC count, 10/25 intubated, 10/26 scans show PNA and fluid overload, patient extubated on 04/17/17.  Chart review shows COPD, VDRF, anemia, AKI and hypokalemia in addition to previous listed statements.  PMH: ETOH abuse.  11/8 R thoracentesis.    PT Comments    Pt continues to make good progress towards his goals.  Pt had been incontinent of feces when PT entered. Pt able to stand from bed, perform his own pericare and walk to sink to wash his hands with min guard assist. After a brief seated rest break pt was able to ambulate in hallway with RW standing to talk to the charge nurse before returning to his room for a total of 250 feet of ambulation. D/c plans remain appropriate. PT will continue to follow acutely.       Follow Up Recommendations  CIR     Equipment Recommendations  Rolling walker with 5" wheels;3in1 (PT)    Recommendations for Other Services       Precautions / Restrictions Precautions Precautions: Fall Precaution Comments: Drain Restrictions Weight Bearing Restrictions: No    Mobility  Bed Mobility Overal bed mobility: Needs Assistance Bed Mobility: Supine to Sit;Sit to Supine     Supine to sit: Supervision Sit to supine: Supervision   General bed mobility comments: supervision for safety,   Transfers Overall transfer level: Needs assistance Equipment used: Rolling walker (2 wheeled) Transfers: Sit to/from Stand Sit to Stand: Min guard         General transfer comment: min guard for safety, good power up and steadying with RW   Ambulation/Gait Ambulation/Gait assistance: Min guard Ambulation Distance (Feet):  250 Feet Assistive device: Rolling walker (2 wheeled) Gait Pattern/deviations: Step-through pattern;Decreased step length - right;Decreased step length - left;Trunk flexed;Shuffle Gait velocity: slowed Gait velocity interpretation: Below normal speed for age/gender General Gait Details: hands on min guard for safety, vc for upright posture and staying close to RW       Balance Overall balance assessment: Needs assistance Sitting-balance support: No upper extremity supported;Feet supported Sitting balance-Leahy Scale: Good Sitting balance - Comments: able to reach outside BoS   Standing balance support: During functional activity;No upper extremity supported Standing balance-Leahy Scale: Good Standing balance comment: able to perform pericare without LoB                            Cognition Arousal/Alertness: Awake/alert Behavior During Therapy: WFL for tasks assessed/performed Overall Cognitive Status: Within Functional Limits for tasks assessed                                        Exercises      General Comments General comments (skin integrity, edema, etc.): SaO2 on RA 97%O2      Pertinent Vitals/Pain Pain Assessment: Faces Faces Pain Scale: Hurts a little bit Pain Location: stomach Pain Descriptors / Indicators: Aching;Grimacing;Guarding;Moaning Pain Intervention(s): Monitored during session;Limited activity within patient's tolerance    Home Living  Prior Function            PT Goals (current goals can now be found in the care plan section) Acute Rehab PT Goals PT Goal Formulation: With patient Time For Goal Achievement: 05/18/17 Potential to Achieve Goals: Good Progress towards PT goals: Progressing toward goals    Frequency    Min 3X/week      PT Plan Current plan remains appropriate       AM-PAC PT "6 Clicks" Daily Activity  Outcome Measure  Difficulty turning over in bed (including  adjusting bedclothes, sheets and blankets)?: A Little Difficulty moving from lying on back to sitting on the side of the bed? : A Little Difficulty sitting down on and standing up from a chair with arms (e.g., wheelchair, bedside commode, etc,.)?: A Little Help needed moving to and from a bed to chair (including a wheelchair)?: A Little Help needed walking in hospital room?: A Little Help needed climbing 3-5 steps with a railing? : A Lot 6 Click Score: 17    End of Session Equipment Utilized During Treatment: Gait belt Activity Tolerance: Patient tolerated treatment well Patient left: in bed;with call bell/phone within reach;with bed alarm set Nurse Communication: Mobility status PT Visit Diagnosis: Unsteadiness on feet (R26.81);Muscle weakness (generalized) (M62.81);Other abnormalities of gait and mobility (R26.89)     Time: 6468-0321 PT Time Calculation (min) (ACUTE ONLY): 36 min  Charges:  $Gait Training: 8-22 mins $Therapeutic Activity: 8-22 mins                    G Codes:       Britnee Mcdevitt B. Migdalia Dk PT, DPT Acute Rehabilitation  [phone] Pager [phone]     [place] 05/11/2017, 5:09 PM

## 2017-05-12 LAB — BASIC METABOLIC PANEL
Anion gap: 11 (ref 5–15)
BUN: 24 mg/dL — ABNORMAL HIGH (ref 6–20)
CALCIUM: 8.8 mg/dL — AB (ref 8.9–10.3)
CO2: 27 mmol/L (ref 22–32)
CREATININE: 1.26 mg/dL — AB (ref 0.61–1.24)
Chloride: 97 mmol/L — ABNORMAL LOW (ref 101–111)
GFR calc Af Amer: >60 mL/min (ref >60)
GFR calc non Af Amer: >60 mL/min (ref >60)
GLUCOSE: 98 mg/dL (ref 65–99)
Potassium: 3.2 mmol/L — ABNORMAL LOW (ref 3.5–5.1)
Sodium: 135 mmol/L (ref 135–145)

## 2017-05-12 LAB — CBC WITH DIFFERENTIAL/PLATELET
BASOS PCT: 1 %
Basophils Absolute: 0.1 10*3/uL (ref 0.0–0.1)
EOS PCT: 4 %
Eosinophils Absolute: 0.5 10*3/uL (ref 0.0–0.7)
HEMATOCRIT: 32.4 % — AB (ref 39.0–52.0)
Hemoglobin: 10.3 g/dL — ABNORMAL LOW (ref 13.0–17.0)
LYMPHS PCT: 27 %
Lymphs Abs: 2.8 10*3/uL (ref 0.7–4.0)
MCH: 30 pg (ref 26.0–34.0)
MCHC: 31.8 g/dL (ref 30.0–36.0)
MCV: 94.5 fL (ref 78.0–100.0)
MONO ABS: 0.6 10*3/uL (ref 0.1–1.0)
MONOS PCT: 6 %
NEUTROS ABS: 6.7 10*3/uL (ref 1.7–7.7)
Neutrophils Relative %: 62 %
PLATELETS: 509 10*3/uL — AB (ref 150–400)
RBC: 3.43 MIL/uL — ABNORMAL LOW (ref 4.22–5.81)
RDW: 16.2 % — AB (ref 11.5–15.5)
WBC: 10.6 10*3/uL — ABNORMAL HIGH (ref 4.0–10.5)

## 2017-05-12 LAB — PATHOLOGIST SMEAR REVIEW

## 2017-05-12 LAB — MAGNESIUM: Magnesium: 1.7 mg/dL (ref 1.7–2.4)

## 2017-05-12 MED ORDER — POTASSIUM CHLORIDE CRYS ER 20 MEQ PO TBCR
20.0000 meq | EXTENDED_RELEASE_TABLET | ORAL | Status: AC
  Administered 2017-05-12 (×3): 20 meq via ORAL
  Filled 2017-05-12 (×3): qty 1

## 2017-05-12 NOTE — Progress Notes (Signed)
Supervising Physician: Marybelle Killings  Patient Status:  [facility] - In-pt  Chief Complaint: Diverticular abscess  Subjective: Anticipating surgery soon.   Allergies: Patient has no known allergies.  Medications: Prior to Admission medications   Not on File     Vital Signs: BP 124/76 (BP Location: Left Arm)   Pulse 86   Temp 98.2 F (36.8 C) (Oral)   Resp 16   Ht 5\' 11"  (1.803 m)   Wt 110 lb 3.7 oz (50 kg)   SpO2 93%   BMI 15.37 kg/m   Physical Exam  Abd: Drain remains in place.  Insertion site intact.  Feculent output.   Imaging: Dg Chest Port 1 View  Result Date: 05/11/2017 CLINICAL DATA:  56 year old male with perforated abdominal bowel, abscess. Moderate to large bilateral pleural effusions and bilateral lower lung disease on 05/04/2017. EXAM: PORTABLE CHEST 1 VIEW COMPARISON:  05/10/2017 and earlier. FINDINGS: Portable AP upright view at 1458 hours. Small if any residual bilateral pleural effusions. Normal cardiac size and mediastinal contours. Improved bibasilar ventilation. Regressed but not fully resolved patchy right lower lung opacity. No areas of worsening ventilation. No pneumothorax. No pneumoperitoneum is evident. Negative visible bowel gas pattern. IMPRESSION: 1. Improved bilateral lung base ventilation since 05/10/2017 with regressed but not resolved right lower lung opacity. 2. Small if any residual pleural effusions. 3. No new cardiopulmonary abnormality. Electronically Signed   By: Genevie Ann M.D.   On: 05/11/2017 15:27   Dg Chest Port 1 View  Result Date: 05/10/2017 CLINICAL DATA:  Pleural effusion. EXAM: PORTABLE CHEST 1 VIEW COMPARISON:  05/09/2017 FINDINGS: The cardiomediastinal silhouette is within normal limits. Confluent, oval density in the right lung base is similar to the prior study and may reflect airspace consolidation or loculated pleural fluid in a fissure. Patchy opacity more laterally and inferiorly in the right lung base on the prior study has  improved. There is mild patchy opacity in the left lung base which is stable to slightly increased. A calcified granuloma is again noted in the left lung base. No pneumothorax is identified. IMPRESSION: Mildly improved aeration of the right lung base with residual consolidation and/or loculated pleural fluid. Stable to slightly increased left basilar atelectasis or infiltrate. Electronically Signed   By: Logan Bores M.D.   On: 05/10/2017 07:32   Dg Chest Port 1 View  Result Date: 05/09/2017 CLINICAL DATA:  Bilateral thoracentesis on 05/08/2017. Evaluate for pleural effusion. EXAM: PORTABLE CHEST 1 VIEW COMPARISON:  05/08/2017 FINDINGS: No definite pneumothorax on this examination. Increased densities in the right lower chest compared to the most recent examination. Heart and mediastinum are within normal limits and stable. The trachea is midline. Calcified granuloma at the left lung base. IMPRESSION: Increased densities in the right lower chest. Findings could represent increasing airspace disease and/or pleural fluid. No pneumothorax. Electronically Signed   By: Markus Daft M.D.   On: 05/09/2017 10:09   Dg Chest Port 1 View  Result Date: 05/08/2017 CLINICAL DATA:  Pleural effusion. Post bilateral thoracentesis earlier this day. EXAM: PORTABLE CHEST 1 VIEW COMPARISON:  Most recent comparison earlier this day at 1500 hour, nearly 4 hours prior. FINDINGS: Unchanged heart size and mediastinal contours. Tiny left apicolateral pneumothorax, less than 5%, not seen on prior exam. No right pneumothorax. Increased patchy and interstitial opacities in both lung bases, right greater than left, may be atelectasis or re-expansion pulmonary edema post thoracentesis. No definite pleural fluid seen on single upright view. Calcified granuloma at the left  lung base. IMPRESSION: Tiny left apical and lateral pneumothorax, less than 5%, not seen on exam earlier this day. Increasing patchy and interstitial bibasilar opacities,  right greater than left, may be atelectasis or re-expansion pulmonary edema post thoracentesis ease today. These results were called by telephone at the time of interpretation on 05/08/2017 at 7:32 pm to Dr. Jani Gravel , who verbally acknowledged these results. Electronically Signed   By: Jeb Levering M.D.   On: 05/08/2017 19:32   Dg Chest Port 1 View  Result Date: 05/08/2017 CLINICAL DATA:  Status post left thoracentesis EXAM: PORTABLE CHEST 1 VIEW COMPARISON:  05/08/2017 at 1413 hours FINDINGS: No pneumothorax is seen status post left thoracentesis. Mild right basilar atelectasis/scarring. Suspected trace right pleural effusion. Calcified granuloma at the lateral left lung base. The heart is normal in size. IMPRESSION: No pneumothorax is seen status post left thoracentesis. These results were called by telephone at the time of interpretation on 05/08/2017 at 3:15 pm to Dr. Heber [place], who verbally acknowledged these results. Electronically Signed   By: Julian Hy M.D.   On: 05/08/2017 15:15   Dg Chest Port 1 View  Result Date: 05/08/2017 CLINICAL DATA:  Right-sided thoracentesis. EXAM: PORTABLE CHEST 1 VIEW COMPARISON:  05/08/2017 at 0655 hours. FINDINGS: Marked decrease in right-sided pleural effusion since thoracentesis with trace fluid blunting the right lateral costophrenic angle. No pneumothorax. Heart is top-normal in size. No aortic aneurysm. Stable granuloma at the left lung base. There is platelike atelectasis in the right lower lobe. No acute osseous abnormality. IMPRESSION: Near complete resolution of right-sided pleural effusion status post thoracentesis. Atelectasis is noted at the right lung base. No pneumothorax is visualized Electronically Signed   By: Ashley Royalty M.D.   On: 05/08/2017 14:40    Labs:  CBC: Recent Labs    05/05/17 0324 05/06/17 0828 05/07/17 0205 05/12/17 0424  WBC 12.5* 10.4 11.9* 10.6*  HGB 9.4* 9.2* 9.7* 10.3*  HCT 29.6* 28.6* 30.6* 32.4*  PLT 508*  491* 482* 509*    COAGS: Recent Labs    04/13/17 0952  INR 1.24  APTT 35    BMP: Recent Labs    05/09/17 0334 05/10/17 0428 05/11/17 0402 05/12/17 0424  NA 137 138 137 135  K 2.8* 3.8 3.5 3.2*  CL 97* 102 99* 97*  CO2 28 27 28 27   GLUCOSE 104* 99 111* 98  BUN 29* 26* 26* 24*  CALCIUM 8.3* 8.5* 8.7* 8.8*  CREATININE 1.41* 1.32* 1.20 1.26*  GFRNONAA 55* 60* >60 >60  GFRAA >60 >60 >60 >60    LIVER FUNCTION TESTS: Recent Labs    04/27/17 0335 04/28/17 0209 04/29/17 0224 04/30/17 0418 05/04/17 0216 05/08/17 1539  BILITOT 0.9 0.7 0.7  --  0.5  --   AST 17 19 16   --  13*  --   ALT 8* 9* 9*  --  6*  --   ALKPHOS 58 69 67  --  73  --   PROT 5.9* 6.5 6.6  --  6.4* 6.3*  ALBUMIN 1.7* 1.8* 1.9* 1.8* 1.7*  --     Assessment and Plan: Diverticular abscess s/p drain placement at [facility].  Continues with fistulous connection and output.  Per surgery, plan is to take to OR soon once pulmonary status assessed.  Possible OR tomorrow.  Drain to remain until surgery.  No changes.  Continue current management.  IR to follow.   Electronically Signed: Docia Barrier, PA 05/12/2017, 11:40 AM   I spent  a total of 15 Minutes at the the patient's bedside AND on the patient's hospital floor or unit, greater than 50% of which was counseling/coordinating care for abdominal abscess.

## 2017-05-12 NOTE — Progress Notes (Signed)
[facility]/Trauma Progress Note      Assessment/Plan Acute [facility] with [facility]ation. [facility] - feculent drainage - Transferred due to respiratory failure10/27/2018. - Earlier hospital course, complicated by DTs, acute respiratory failure with hypoxia, ARDS, aspiration pneumonia and circulatory shock which required intubation and pressors. Also ARF requiring dialysis.Lungs are improving. - colocutaneous fistula   Per Dr. Darrel Hoover note 11/25, CCM has cleared pt for surgery from pulmonary standpoint.  pleural effusions - Successful bilateral thoracentesis11/23; CCM recs repeat CXR 11/26 showed improved b/l lung base ventilation, small if any residual pleural effusions  Loculated left-sided ascites.600 mL aspirated. Cultures negative Possible yeast UTI Hx of tobacco use Hx of alcohol abuse Anemia Chronic kidney disease - creatinine now normalized 1.2  FEN: carb mod/hearthealthy XF:GHWE stopped 11/16, IV zosyn (11/2=>>day 20).Diflucan 11/25 for possible yeast UTI. C. difficile negative. DVT: Heparin to be stopped after 2200 dose tonight then resume 24 hours after surgery    Plan:continue PT and increase physical activity as able. Continue antibiotics. Plan for surgery tomorrow 11/28     LOS: 31 days    Subjective: CC: abdominal pain  Pain is unchanged. No worse. Having liquid BM's without increased pain. He is eating more. No nausea or vomiting and tolerating diet. Discussed surgery planned for tomorrow.  Objective: Vital signs in last 24 hours: Temp:  [98.2 F (36.8 C)-99 F (37.2 C)] 98.2 F (36.8 C) (11/27 0658) Pulse Rate:  [83-91] 86 (11/27 0658) Resp:  [16] 16 (11/27 0658) BP: (112-124)/(68-76) 124/76 (11/27 0658) SpO2:  [93 %-96 %] 93 % (11/27 0658) Weight:  [110 lb 3.7 oz (50 kg)] 110 lb 3.7 oz (50 kg) (11/27 0400) Last BM Date: 05/12/17  Intake/Output from  previous day: 11/26 0701 - 11/27 0700 In: 82 [IV Piggyback:50] Out: 1950 [Urine:1950] Intake/Output this shift: Total I/O In: 220 [P.O.:220] Out: 300 [Urine:300]  PE: Gen:  Alert, NAD, pleasant, cooperative Card:  RRR, no M/G/R heard Pulm:  Rate and effort normal Abd: Soft, NT/ND, +BS, no HSM, drain site C/D/I and drainage with feculent green liquid in bag Skin: no rashes noted, warm and dry   Anti-infectives: Anti-infectives (From admission, onward)   Start     Dose/Rate Route Frequency Ordered Stop   05/08/17 1000  fluconazole (DIFLUCAN) tablet 100 mg     100 mg Oral Daily 05/08/17 0603 05/12/17 0817   05/04/17 0130  piperacillin-tazobactam (ZOSYN) IVPB 3.375 g     3.375 g 12.5 mL/hr over 240 Minutes Intravenous Every 8 hours 05/03/17 2211     04/28/17 1530  vancomycin (VANCOCIN) IVPB 750 mg/150 ml premix  Status:  Discontinued     750 mg 150 mL/hr over 60 Minutes Intravenous Every 24 hours 04/28/17 1424 05/01/17 1128   04/27/17 1400  piperacillin-tazobactam (ZOSYN) IVPB 3.375 g  Status:  Discontinued     3.375 g 12.5 mL/hr over 240 Minutes Intravenous Every 8 hours 04/27/17 1158 05/03/17 2211   04/26/17 1830  vancomycin (VANCOCIN) 1,250 mg in sodium chloride 0.9 % 250 mL IVPB  Status:  Discontinued     1,250 mg 166.7 mL/hr over 90 Minutes Intravenous Every 48 hours 04/26/17 1739 04/28/17 1424   04/24/17 1700  vancomycin (VANCOCIN) IVPB 1000 mg/200 mL premix  Status:  Discontinued     1,000 mg 200 mL/hr over 60 Minutes Intravenous  Once 04/24/17 1652 04/24/17 1656   04/24/17 1700  vancomycin (VANCOCIN) IVPB 1000 mg/200 mL premix  Status:  Discontinued     1,000  mg 200 mL/hr over 60 Minutes Intravenous Every 48 hours 04/24/17 1656 04/26/17 1739   04/23/17 1500  piperacillin-tazobactam (ZOSYN) IVPB 3.375 g  Status:  Discontinued     3.375 g 12.5 mL/hr over 240 Minutes Intravenous Every 12 hours 04/23/17 1107 04/27/17 1158   04/18/17 1800  piperacillin-tazobactam (ZOSYN)  IVPB 3.375 g  Status:  Discontinued     3.375 g 12.5 mL/hr over 240 Minutes Intravenous Every 12 hours 04/18/17 1032 04/23/17 1107   04/18/17 1400  piperacillin-tazobactam (ZOSYN) IVPB 3.375 g  Status:  Discontinued     3.375 g 12.5 mL/hr over 240 Minutes Intravenous Every 8 hours 04/18/17 1031 04/18/17 1032   04/17/17 1200  piperacillin-tazobactam (ZOSYN) IVPB 3.375 g  Status:  Discontinued     3.375 g 100 mL/hr over 30 Minutes Intravenous Every 6 hours 04/17/17 1030 04/18/17 1031   04/17/17 1100  piperacillin-tazobactam (ZOSYN) IVPB 3.375 g  Status:  Discontinued     3.375 g 12.5 mL/hr over 240 Minutes Intravenous Every 8 hours 04/17/17 1019 04/17/17 1030   04/15/17 2125  Ampicillin-Sulbactam (UNASYN) 3 g in sodium chloride 0.9 % 100 mL IVPB  Status:  Discontinued     3 g 200 mL/hr over 30 Minutes Intravenous Every 8 hours 04/15/17 1529 04/17/17 1019   04/15/17 1100  ampicillin-sulbactam (UNASYN) 1.5 g in sodium chloride 0.9 % 50 mL IVPB  Status:  Discontinued     1.5 g 100 mL/hr over 30 Minutes Intravenous Every 12 hours 04/15/17 0937 04/15/17 1529   04/13/17 2200  levofloxacin (LEVAQUIN) IVPB 500 mg  Status:  Discontinued     500 mg 100 mL/hr over 60 Minutes Intravenous Every 48 hours 04/12/17 0114 04/12/17 0846   04/13/17 1800  piperacillin-tazobactam (ZOSYN) IVPB 2.25 g  Status:  Discontinued     2.25 g 100 mL/hr over 30 Minutes Intravenous Every 8 hours 04/13/17 1050 04/15/17 0937   04/12/17 0200  piperacillin-tazobactam (ZOSYN) IVPB 3.375 g  Status:  Discontinued     3.375 g 12.5 mL/hr over 240 Minutes Intravenous Every 8 hours 04/12/17 0103 04/13/17 1050   04/12/17 0000  levofloxacin (LEVAQUIN) IVPB 750 mg  Status:  Discontinued     750 mg 100 mL/hr over 90 Minutes Intravenous Every 48 hours 04/11/17 2230 04/12/17 0114      Lab Results:  Recent Labs    05/12/17 0424  WBC 10.6*  HGB 10.3*  HCT 32.4*  PLT 509*   BMET Recent Labs    05/11/17 0402 05/12/17 0424  NA  137 135  K 3.5 3.2*  CL 99* 97*  CO2 28 27  GLUCOSE 111* 98  BUN 26* 24*  CREATININE 1.20 1.26*  CALCIUM 8.7* 8.8*   PT/INR No results for input(s): LABPROT, INR in the last 72 hours. CMP     Component Value Date/Time   NA 135 05/12/2017 0424   K 3.2 (L) 05/12/2017 0424   CL 97 (L) 05/12/2017 0424   CO2 27 05/12/2017 0424   GLUCOSE 98 05/12/2017 0424   BUN 24 (H) 05/12/2017 0424   CREATININE 1.26 (H) 05/12/2017 0424   CALCIUM 8.8 (L) 05/12/2017 0424   PROT 6.3 (L) 05/08/2017 1539   ALBUMIN 1.7 (L) 05/04/2017 0216   AST 13 (L) 05/04/2017 0216   ALT 6 (L) 05/04/2017 0216   ALKPHOS 73 05/04/2017 0216   BILITOT 0.5 05/04/2017 0216   GFRNONAA >60 05/12/2017 0424   GFRAA >60 05/12/2017 0424   Lipase  No results found for: LIPASE  Studies/Results: Dg Chest Port 1 View  Result Date: 05/11/2017 CLINICAL DATA:  56 year old male with perforated abdominal bowel, abscess. Moderate to large bilateral pleural effusions and bilateral lower lung disease on 05/04/2017. EXAM: PORTABLE CHEST 1 VIEW COMPARISON:  05/10/2017 and earlier. FINDINGS: Portable AP upright view at 1458 hours. Small if any residual bilateral pleural effusions. Normal cardiac size and mediastinal contours. Improved bibasilar ventilation. Regressed but not fully resolved patchy right lower lung opacity. No areas of worsening ventilation. No pneumothorax. No pneumoperitoneum is evident. Negative visible bowel gas pattern. IMPRESSION: 1. Improved bilateral lung base ventilation since 05/10/2017 with regressed but not resolved right lower lung opacity. 2. Small if any residual pleural effusions. 3. No new cardiopulmonary abnormality. Electronically Signed   By: Genevie Ann M.D.   On: 05/11/2017 15:27      [place] [facility] Surgery 05/12/2017, 11:45 AM Pager: [phone] Consults: [phone] Mon-Fri 7:00 am-4:30 pm Sat-Sun 7:00 am-11:30 am

## 2017-05-12 NOTE — Progress Notes (Signed)
Physical Therapy Treatment Patient Details Name: Dominic Phillips MRN: [id number] DOB: 1961/05/29 Today's Date: 05/12/2017    History of Present Illness Pt admitted to [facility] and transferred on 04/02/17 with c/o abdominal pain.  Pt presented with ETOH withdrawls, liver abscess and SBO on TPN.  10/23 presents with hypoxia and increased WBC count, 10/25 intubated, 10/26 scans show PNA and fluid overload, patient extubated on 04/17/17.  Chart review shows COPD, VDRF, anemia, AKI and hypokalemia in addition to previous listed statements.  PMH: ETOH abuse.  11/8 R thoracentesis.    PT Comments    Pt continues to be incontinent of feces. Pt min guard for standing performing pericare and washing hands. Pt states that he is feeling weaker today and experienced L knee buckling with gait requiring minA to steady. After knee buckling pt requested to return to room. PT will reassess and continue to follow after surgery.      Follow Up Recommendations  CIR     Equipment Recommendations  Rolling walker with 5" wheels;3in1 (PT)    Recommendations for Other Services       Precautions / Restrictions Precautions Precautions: Fall Precaution Comments: Drain Restrictions Weight Bearing Restrictions: No    Mobility  Bed Mobility Overal bed mobility: Needs Assistance Bed Mobility: Supine to Sit;Sit to Supine     Supine to sit: Supervision Sit to supine: Supervision   General bed mobility comments: supervision for safety,   Transfers Overall transfer level: Needs assistance Equipment used: Rolling walker (2 wheeled) Transfers: Sit to/from Stand Sit to Stand: Min guard         General transfer comment: min guard for safety, good power up and steadying with RW   Ambulation/Gait Ambulation/Gait assistance: Min guard Ambulation Distance (Feet): 150 Feet Assistive device: Rolling walker (2 wheeled) Gait Pattern/deviations: Step-through pattern;Decreased step length -  right;Decreased step length - left;Trunk flexed;Shuffle Gait velocity: slowed Gait velocity interpretation: Below normal speed for age/gender General Gait Details: hands on min guard for safety, vc for upright posture, pt with knee buckling after 80 feet, pt reports feeling weaker today.       Balance Overall balance assessment: Needs assistance Sitting-balance support: No upper extremity supported;Feet supported Sitting balance-Leahy Scale: Good Sitting balance - Comments: able to reach outside BoS   Standing balance support: During functional activity;No upper extremity supported Standing balance-Leahy Scale: Good Standing balance comment: able to perform pericare without LoB                            Cognition Arousal/Alertness: Awake/alert Behavior During Therapy: WFL for tasks assessed/performed Overall Cognitive Status: Within Functional Limits for tasks assessed                                         Pertinent Vitals/Pain Faces Pain Scale: Hurts a little bit Pain Location: stomach Pain Descriptors / Indicators: Aching;Grimacing;Guarding;Moaning           PT Goals (current goals can now be found in the care plan section) Acute Rehab PT Goals PT Goal Formulation: With patient Time For Goal Achievement: 05/18/17 Potential to Achieve Goals: Good Progress towards PT goals: Progressing toward goals    Frequency    Min 3X/week      PT Plan Current plan remains appropriate       AM-PAC PT "6 Clicks" Daily Activity  Outcome  Measure  Difficulty turning over in bed (including adjusting bedclothes, sheets and blankets)?: A Little Difficulty moving from lying on back to sitting on the side of the bed? : A Little Difficulty sitting down on and standing up from a chair with arms (e.g., wheelchair, bedside commode, etc,.)?: A Little Help needed moving to and from a bed to chair (including a wheelchair)?: A Little Help needed walking in  hospital room?: A Little Help needed climbing 3-5 steps with a railing? : A Lot 6 Click Score: 17    End of Session Equipment Utilized During Treatment: Gait belt Activity Tolerance: Patient tolerated treatment well Patient left: in bed;with call bell/phone within reach;with bed alarm set Nurse Communication: Mobility status PT Visit Diagnosis: Unsteadiness on feet (R26.81);Muscle weakness (generalized) (M62.81);Other abnormalities of gait and mobility (R26.89)     Time: 4037-0964 PT Time Calculation (min) (ACUTE ONLY): 30 min  Charges:  $Gait Training: 8-22 mins $Therapeutic Activity: 8-22 mins                    G Codes:       Cainen Burnham B. Migdalia Dk PT, DPT Acute Rehabilitation  [phone] Pager [phone]     [place] 05/12/2017, 5:13 PM

## 2017-05-12 NOTE — Progress Notes (Signed)
Nutrition Follow-up  DOCUMENTATION CODES:   Non-severe (moderate) malnutrition in context of chronic illness  INTERVENTION:  -Continue MVI daily -Continue Ensure Enlive po QID, each supplement provides 350 kcal and 20 grams of protein -Continue Prostat BID, each supplement provides 100 kcals and 15 grams protein  NUTRITION DIAGNOSIS:   Moderate Malnutrition related to chronic illness(ETOH abuse) as evidenced by mild fat depletion, mild muscle depletion. -ongoing  GOAL:   Patient will meet greater than or equal to 90% of their needs -progressing  MONITOR:   PO intake, Supplement acceptance, Labs, I & O's, Weight trends  ASSESSMENT:   56 year old male with PMH of ETOH abuse who was admitted to [facility] on 10/18 with diverticular abscess; hospital course complicated by DT's, development of a SBO, and PNA; required intubation on 10/25. He was transferred to [facility] on 10/27 with worsening renal failure, ARDS, aspiration PNA.  HD at this time 11/7- reviewed surgery note; plan to control leak and continue antibiotics; potential surgery in 6-8 weeks 11/8- s/p rt thoracentesis (1.3 L removed) 11/10- SLP evaluation- continue regular consistency diet with thin liquids, IJ cath removed 11/18- transferred from SDU to medical floor 11/20- s/p lt paracentesis, 600 ml fluid removed 11/21- per surgery note, plan to d/c condom cath and rectal tube, check c-diff 11/23 - bilateral thoracentesis  removal of 800 mL from left and 1100 mL from right 11/27 - cleared for surgery tomorrow for perf - hartmanns per Dr. Donne Hazel  PO improving, ate french toast and eggs this morning. Drinking ensure. Taking Pro-stat. States he has been getting full quickly but is improving now.   Meal Completion: 25% Biliary Tube RLQ  Labs reviewed:  K 3.2,  Medications reviewed and include:  Folic Acid, MVI w/ Minerals, Thiamine   Intake/Output Summary (Last 24 hours) at 05/12/2017 1504 Last data filed  at 05/12/2017 1331 Gross per 24 hour  Intake 440 ml  Output 1700 ml  Net -1260 ml     Diet Order:  Diet heart healthy/carb modified Room service appropriate? Yes; Fluid consistency: Thin Diet NPO time specified  EDUCATION NEEDS:   Education needs have been addressed  Skin:  Skin Assessment: Skin Integrity Issues: Skin Integrity Issues:: Stage II Stage I: sacrum, epithelialized Stage II: To sacrum  Last BM:  05/12/2017  Height:   Ht Readings from Last 1 Encounters:  04/29/17 5\' 11"  (1.803 m)    Weight:   Wt Readings from Last 1 Encounters:  05/12/17 110 lb 3.7 oz (50 kg)    Ideal Body Weight:  78.2 kg  BMI:  Body mass index is 15.37 kg/m.  Estimated Nutritional Needs:   Kcal:  2000-2200  Protein:  100-115 grams  Fluid:  2-2.2 L    Satira Anis. Ingvald Theisen, MS, RD LDN Inpatient Clinical Dietitian Pager [phone]

## 2017-05-12 NOTE — Progress Notes (Signed)
Patient ID: Dominic Phillips, male   DOB: 1960/07/11, 56 y.o.   MRN: [id number]  PROGRESS NOTE    Dominic Phillips  ASN:[id number] DOB: 01/12/1961 DOA: 04/11/2017 PCP: No primary care provider on file.   Brief Narrative:  56 year old male with past medical history of alcohol abuse presented to [facility] on 04/02/2017 with abdominal pain without fevers or leukocytosis. CT abdomen revealed pericolonic inflammation/fluid collection and he was admitted for diverticular abscess. General surgery was consulted who suggested placement of percutaneous drain by IR. Cultures grew Escherichia coli. Hospital course complicated by DTs, acute respiratory failure with hypoxia/ARDS/aspiration pneumonia and circulatory shock for which he required intubation and pressors; acute kidney injury for which she required CRRT and hemodialysis 1. Shock has resolved. Currently extubated and on room air. Renal function is improving. Nephrology signed off. Gen. surgery continues to follow.  Assessment & Plan:   Active Problems:   Acute respiratory distress syndrome (ARDS) (HCC)   Acute kidney injury ([place])   Pressure injury of skin   Colonic diverticular abscess   COPD (chronic obstructive pulmonary disease) (HCC)   Dyspnea   HCAP (healthcare-associated pneumonia)   Acute blood loss anemia   Anemia of chronic disease   ETOH abuse   Tobacco abuse   Tachycardia   Tachypnea   Leukocytosis   Post-operative pain   Acute respiratory failure with hypoxia ([place])   Palliative care by specialist   History of ETT   Pleural effusion   Encounter for imaging study to confirm orogastric (OG) tube placement   Endotracheally intubated   Routine adult health maintenance   Acute hypoxic respiratory failure/ARDS with bilateral pleural effusions - Multifactorial second to pulmonary edema versus pleural effusions versus atelectasis versus pneumonia - Currently on room air. Pulmonary following. - Required intubation from  04/11/2017-04/17/17 -Status post thoracentesis on 04/23/2017 with removal of 1.3 L fluid and bilateral thoracentesis on 05/08/2017 with removal of 800 mL from left and 1100 mL from right - Follow chest x-ray intermittently  Diverticulitis with perforation and abscess status post IR drain - General surgery following. Continue Zosyn. 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 - REPEAT CT 05/04/17-No residual fluid collection at site of pigtail drainage catheter in right pelvis.  Hypokalemia -Replace; repeat AM labs  Acute kidney injury status post CRRT on 04/11/2017 and intermittent dialysis -Renal function improved and stable. Nephrology has signed off -Follow renal function intermittently -HD catheter removed on 04/25/2017  Anemia of chronic disease - Status post 1 unit of packed red cells transfusion -Continue iron replacement -Monitor hemoglobin -No signs of acute bleeding  Thrombocytosis - probably reactive; monitor  Moderate malnutrition of chronic illness -Follow nutrition recommendations   DVT prophylaxis: Heparin Code Status:  Partial with no prolonged mechanical ventilation and intubation Family Communication: None at bedside Disposition Plan: Depends on clinical outcome  Consultants:  General surgery IR PCCM   Procedures: IR drain in [facility] on 04/02/2017 CRRT Hemodialysis 1 Removal of HD dialysis catheter Intubation and extubation from 04/11/2017-04/17/17 Thoracentesis 2  Antimicrobials:  Anti-infectives (From admission, onward)   Start     Dose/Rate Route Frequency Ordered Stop   05/08/17 1000  fluconazole (DIFLUCAN) tablet 100 mg     100 mg Oral Daily 05/08/17 0603 05/13/17 0959   05/04/17 0130  piperacillin-tazobactam (ZOSYN) IVPB 3.375 g     3.375 g 12.5 mL/hr over 240 Minutes Intravenous Every 8 hours 05/03/17 2211  04/28/17 1530   vancomycin (VANCOCIN) IVPB 750 mg/150 ml premix  Status:  Discontinued     750 mg 150 mL/hr over 60 Minutes Intravenous Every 24 hours 04/28/17 1424 05/01/17 1128   04/27/17 1400  piperacillin-tazobactam (ZOSYN) IVPB 3.375 g  Status:  Discontinued     3.375 g 12.5 mL/hr over 240 Minutes Intravenous Every 8 hours 04/27/17 1158 05/03/17 2211   04/26/17 1830  vancomycin (VANCOCIN) 1,250 mg in sodium chloride 0.9 % 250 mL IVPB  Status:  Discontinued     1,250 mg 166.7 mL/hr over 90 Minutes Intravenous Every 48 hours 04/26/17 1739 04/28/17 1424   04/24/17 1700  vancomycin (VANCOCIN) IVPB 1000 mg/200 mL premix  Status:  Discontinued     1,000 mg 200 mL/hr over 60 Minutes Intravenous  Once 04/24/17 1652 04/24/17 1656   04/24/17 1700  vancomycin (VANCOCIN) IVPB 1000 mg/200 mL premix  Status:  Discontinued     1,000 mg 200 mL/hr over 60 Minutes Intravenous Every 48 hours 04/24/17 1656 04/26/17 1739   04/23/17 1500  piperacillin-tazobactam (ZOSYN) IVPB 3.375 g  Status:  Discontinued     3.375 g 12.5 mL/hr over 240 Minutes Intravenous Every 12 hours 04/23/17 1107 04/27/17 1158   04/18/17 1800  piperacillin-tazobactam (ZOSYN) IVPB 3.375 g  Status:  Discontinued     3.375 g 12.5 mL/hr over 240 Minutes Intravenous Every 12 hours 04/18/17 1032 04/23/17 1107   04/18/17 1400  piperacillin-tazobactam (ZOSYN) IVPB 3.375 g  Status:  Discontinued     3.375 g 12.5 mL/hr over 240 Minutes Intravenous Every 8 hours 04/18/17 1031 04/18/17 1032   04/17/17 1200  piperacillin-tazobactam (ZOSYN) IVPB 3.375 g  Status:  Discontinued     3.375 g 100 mL/hr over 30 Minutes Intravenous Every 6 hours 04/17/17 1030 04/18/17 1031   04/17/17 1100  piperacillin-tazobactam (ZOSYN) IVPB 3.375 g  Status:  Discontinued     3.375 g 12.5 mL/hr over 240 Minutes Intravenous Every 8 hours 04/17/17 1019 04/17/17 1030   04/15/17 2125  Ampicillin-Sulbactam (UNASYN) 3 g in sodium chloride 0.9 % 100 mL IVPB  Status:  Discontinued     3  g 200 mL/hr over 30 Minutes Intravenous Every 8 hours 04/15/17 1529 04/17/17 1019   04/15/17 1100  ampicillin-sulbactam (UNASYN) 1.5 g in sodium chloride 0.9 % 50 mL IVPB  Status:  Discontinued     1.5 g 100 mL/hr over 30 Minutes Intravenous Every 12 hours 04/15/17 0937 04/15/17 1529   04/13/17 2200  levofloxacin (LEVAQUIN) IVPB 500 mg  Status:  Discontinued     500 mg 100 mL/hr over 60 Minutes Intravenous Every 48 hours 04/12/17 0114 04/12/17 0846   04/13/17 1800  piperacillin-tazobactam (ZOSYN) IVPB 2.25 g  Status:  Discontinued     2.25 g 100 mL/hr over 30 Minutes Intravenous Every 8 hours 04/13/17 1050 04/15/17 0937   04/12/17 0200  piperacillin-tazobactam (ZOSYN) IVPB 3.375 g  Status:  Discontinued     3.375 g 12.5 mL/hr over 240 Minutes Intravenous Every 8 hours 04/12/17 0103 04/13/17 1050   04/12/17 0000  levofloxacin (LEVAQUIN) IVPB 750 mg  Status:  Discontinued     750 mg 100 mL/hr over 90 Minutes Intravenous Every 48 hours 04/11/17 2230 04/12/17 0114       Subjective: Patient seen and examined at bedside. No overnight fever, nausea or vomiting.  Objective: Vitals:   05/11/17 1447 05/11/17 2136 05/12/17 0400 05/12/17 0658  BP: 112/68 124/73  124/76  Pulse: 91 83  86  Resp:  16 16  16   Temp: 98.7 F (37.1 C) 99 F (37.2 C)  98.2 F (36.8 C)  TempSrc: Oral Oral  Oral  SpO2: 96% 94%  93%  Weight:   50 kg (110 lb 3.7 oz)   Height:        Intake/Output Summary (Last 24 hours) at 05/12/2017 0755 Last data filed at 05/12/2017 0651 Gross per 24 hour  Intake 50 ml  Output 1950 ml  Net -1900 ml   Filed Weights   05/10/17 0138 05/11/17 0044 05/12/17 0400  Weight: 54.3 kg (119 lb 11.4 oz) 51.9 kg (114 lb 6.7 oz) 50 kg (110 lb 3.7 oz)    Examination:  General exam: Appears calm and comfortable; no distress Respiratory system: Bilateral decreased breath sound at bases Cardiovascular system: S1 & S2 heard, rate controlled  Gastrointestinal system: Abdomen is  nondistended, soft and nontender. Normal bowel sounds heard. Extremities: No cyanosis, clubbing, edema   Data Reviewed: I have personally reviewed following labs and imaging studies  CBC: Recent Labs  Lab 05/06/17 0828 05/07/17 0205 05/12/17 0424  WBC 10.4 11.9* 10.6*  NEUTROABS  --   --  6.7  HGB 9.2* 9.7* 10.3*  HCT 28.6* 30.6* 32.4*  MCV 95.3 95.3 94.5  PLT 491* 482* 979*   Basic Metabolic Panel: Recent Labs  Lab 05/07/17 0205 05/09/17 0334 05/09/17 0830 05/10/17 0428 05/11/17 0402 05/12/17 0424  NA 138 137  --  138 137 135  K 3.7 2.8*  --  3.8 3.5 3.2*  CL 100* 97*  --  102 99* 97*  CO2 28 28  --  27 28 27   GLUCOSE 108* 104*  --  99 111* 98  BUN 31* 29*  --  26* 26* 24*  CREATININE 1.77* 1.41*  --  1.32* 1.20 1.26*  CALCIUM 8.3* 8.3*  --  8.5* 8.7* 8.8*  MG  --   --  1.8 2.3  --  1.7   GFR: Estimated Creatinine Clearance: 47.4 mL/min (A) (by C-G formula based on SCr of 1.26 mg/dL (H)). Liver Function Tests: Recent Labs  Lab 05/08/17 1539  PROT 6.3*   No results for input(s): LIPASE, AMYLASE in the last 168 hours. No results for input(s): AMMONIA in the last 168 hours. Coagulation Profile: No results for input(s): INR, PROTIME in the last 168 hours. Cardiac Enzymes: No results for input(s): CKTOTAL, CKMB, CKMBINDEX, TROPONINI in the last 168 hours. BNP (last 3 results) No results for input(s): PROBNP in the last 8760 hours. HbA1C: No results for input(s): HGBA1C in the last 72 hours. CBG: Recent Labs  Lab 05/08/17 0808 05/09/17 1211  GLUCAP 96 97   Lipid Profile: No results for input(s): CHOL, HDL, LDLCALC, TRIG, CHOLHDL, LDLDIRECT in the last 72 hours. Thyroid Function Tests: No results for input(s): TSH, T4TOTAL, FREET4, T3FREE, THYROIDAB in the last 72 hours. Anemia Panel: No results for input(s): VITAMINB12, FOLATE, FERRITIN, TIBC, IRON, RETICCTPCT in the last 72 hours. Sepsis Labs: No results for input(s): PROCALCITON, LATICACIDVEN in the  last 168 hours.  Recent Results (from the past 240 hour(s))  Culture, body fluid-bottle     Status: None   Collection Time: 05/05/17  2:49 PM  Result Value Ref Range Status   Specimen Description FLUID PERITONEAL  Final   Special Requests BOTTLES DRAWN AEROBIC AND ANAEROBIC  Final   Culture NO GROWTH 5 DAYS  Final   Report Status 05/10/2017 FINAL  Final  Gram stain     Status: None  Collection Time: 05/05/17  2:49 PM  Result Value Ref Range Status   Specimen Description FLUID PERITONEAL  Final   Special Requests NONE  Final   Gram Stain   Final    CYTOSPIN SMEAR WBC PRESENT,BOTH PMN AND MONONUCLEAR NO ORGANISMS SEEN    Report Status 05/05/2017 FINAL  Final  Urine Culture     Status: Abnormal   Collection Time: 05/06/17  9:18 AM  Result Value Ref Range Status   Specimen Description URINE, RANDOM  Final   Special Requests NONE  Final   Culture 20,000 COLONIES/mL YEAST (A)  Final   Report Status 05/07/2017 FINAL  Final  C difficile quick scan w PCR reflex     Status: None   Collection Time: 05/06/17  9:18 AM  Result Value Ref Range Status   C Diff antigen NEGATIVE NEGATIVE Final   C Diff toxin NEGATIVE NEGATIVE Final   C Diff interpretation No C. difficile detected.  Final  Gram stain     Status: None   Collection Time: 05/08/17  3:15 PM  Result Value Ref Range Status   Specimen Description PLEURAL  Final   Special Requests RIGHT  Final   Gram Stain   Final    WBC PRESENT, PREDOMINANTLY MONONUCLEAR NO ORGANISMS SEEN CYTOSPIN SMEAR    Report Status 05/09/2017 FINAL  Final  Culture, body fluid-bottle     Status: None (Preliminary result)   Collection Time: 05/08/17  3:15 PM  Result Value Ref Range Status   Specimen Description PLEURAL  Final   Special Requests RIGHT  Final   Culture NO GROWTH 3 DAYS  Final   Report Status PENDING  Incomplete         Radiology Studies: Dg Chest Port 1 View  Result Date: 05/11/2017 CLINICAL DATA:  56 year old male with  perforated abdominal bowel, abscess. Moderate to large bilateral pleural effusions and bilateral lower lung disease on 05/04/2017. EXAM: PORTABLE CHEST 1 VIEW COMPARISON:  05/10/2017 and earlier. FINDINGS: Portable AP upright view at 1458 hours. Small if any residual bilateral pleural effusions. Normal cardiac size and mediastinal contours. Improved bibasilar ventilation. Regressed but not fully resolved patchy right lower lung opacity. No areas of worsening ventilation. No pneumothorax. No pneumoperitoneum is evident. Negative visible bowel gas pattern. IMPRESSION: 1. Improved bilateral lung base ventilation since 05/10/2017 with regressed but not resolved right lower lung opacity. 2. Small if any residual pleural effusions. 3. No new cardiopulmonary abnormality. Electronically Signed   By: Genevie Ann M.D.   On: 05/11/2017 15:27        Scheduled Meds: . feeding supplement (ENSURE ENLIVE)  237 mL Oral QID  . feeding supplement (PRO-STAT SUGAR FREE 64)  30 mL Oral BID  . fluconazole  100 mg Oral Daily  . folic acid  1 mg Oral QHS  . guaiFENesin  1,200 mg Oral BID  . heparin subcutaneous  5,000 Units Subcutaneous Q8H  . mouth rinse  15 mL Mouth Rinse BID  . multivitamin with minerals  1 tablet Oral Daily  . pantoprazole  40 mg Oral QHS  . potassium chloride  20 mEq Oral Q2H  . saccharomyces boulardii  250 mg Oral BID  . thiamine  100 mg Oral QHS  . traZODone  50 mg Oral QHS   Continuous Infusions: . sodium chloride    . piperacillin-tazobactam (ZOSYN)  IV 3.375 g (05/12/17 0145)     LOS: 31 days        Aline August, MD [facility]  Pager [phone]  If 7PM-7AM, please contact night-coverage [email or web address] Password [email or web address] 05/12/2017, 7:55 AM

## 2017-05-12 NOTE — Consult Note (Addendum)
[place] Nurse requested for preoperative stoma site marking  Discussed surgical procedure and stoma creation with patient.  Explained role of the [place] nurse team.  Provided the patient with educational booklet and provided samples of pouching options.  Answered patient's questions. Examined patient lying, sitting, and standing in order to place the marking in the patient's visual field, away from any creases or abdominal contour issues and within the rectus muscle.  Attempted to mark below the patient's belt line.   Marked for colostomy in the LLQ  _5___ cm to the left of the umbilicus and __4__WN below the umbilicus.  Patient's abdomen cleansed with CHG wipes at site markings, allowed to air dry prior to marking.Pt plans for surgery tomorrow. [place] Nurse team will follow up with patient after surgery for continue ostomy care and teaching.  Julien Girt MSN, RN, [place], [place], [place]

## 2017-05-13 ENCOUNTER — Encounter (HOSPITAL_COMMUNITY)
Admission: AD | Disposition: A | Discharge status: Home / Self Care | Payer: Self-pay | Source: Other Acute Inpatient Hospital | Attending: Internal Medicine

## 2017-05-13 ENCOUNTER — Encounter (HOSPITAL_COMMUNITY): Payer: Self-pay | Admitting: *Deleted

## 2017-05-13 ENCOUNTER — Inpatient Hospital Stay (HOSPITAL_COMMUNITY): Payer: Medicaid Other | Admitting: Anesthesiology

## 2017-05-13 DIAGNOSIS — J90 Pleural effusion, not elsewhere classified: Secondary | ICD-10-CM

## 2017-05-13 HISTORY — PX: COLECTOMY WITH COLOSTOMY CREATION/HARTMANN PROCEDURE: SHX6598

## 2017-05-13 LAB — CBC
HCT: 37.5 % — ABNORMAL LOW (ref 39.0–52.0)
Hemoglobin: 11.9 g/dL — ABNORMAL LOW (ref 13.0–17.0)
MCH: 30.5 pg (ref 26.0–34.0)
MCHC: 31.7 g/dL (ref 30.0–36.0)
MCV: 96.2 fL (ref 78.0–100.0)
PLATELETS: 501 10*3/uL — AB (ref 150–400)
RBC: 3.9 MIL/uL — AB (ref 4.22–5.81)
RDW: 16.3 % — ABNORMAL HIGH (ref 11.5–15.5)
WBC: 19 10*3/uL — ABNORMAL HIGH (ref 4.0–10.5)

## 2017-05-13 LAB — CULTURE, BODY FLUID-BOTTLE: CULTURE: NO GROWTH

## 2017-05-13 LAB — CBC WITH DIFFERENTIAL/PLATELET
BASOS PCT: 1 %
Basophils Absolute: 0.1 10*3/uL (ref 0.0–0.1)
EOS ABS: 0.5 10*3/uL (ref 0.0–0.7)
EOS PCT: 5 %
HCT: 31.9 % — ABNORMAL LOW (ref 39.0–52.0)
HEMOGLOBIN: 10.1 g/dL — AB (ref 13.0–17.0)
Lymphocytes Relative: 24 %
Lymphs Abs: 2.8 10*3/uL (ref 0.7–4.0)
MCH: 29.9 pg (ref 26.0–34.0)
MCHC: 31.7 g/dL (ref 30.0–36.0)
MCV: 94.4 fL (ref 78.0–100.0)
Monocytes Absolute: 0.8 10*3/uL (ref 0.1–1.0)
Monocytes Relative: 7 %
NEUTROS PCT: 63 %
Neutro Abs: 7.1 10*3/uL (ref 1.7–7.7)
Platelets: 524 10*3/uL — ABNORMAL HIGH (ref 150–400)
RBC: 3.38 MIL/uL — AB (ref 4.22–5.81)
RDW: 16.1 % — ABNORMAL HIGH (ref 11.5–15.5)
WBC: 11.3 10*3/uL — ABNORMAL HIGH (ref 4.0–10.5)

## 2017-05-13 LAB — BASIC METABOLIC PANEL
Anion gap: 10 (ref 5–15)
BUN: 25 mg/dL — AB (ref 6–20)
CHLORIDE: 101 mmol/L (ref 101–111)
CO2: 27 mmol/L (ref 22–32)
CREATININE: 1.09 mg/dL (ref 0.61–1.24)
Calcium: 8.9 mg/dL (ref 8.9–10.3)
Glucose, Bld: 95 mg/dL (ref 65–99)
POTASSIUM: 3.5 mmol/L (ref 3.5–5.1)
SODIUM: 138 mmol/L (ref 135–145)

## 2017-05-13 LAB — CHOLESTEROL, BODY FLUID: Cholesterol, Fluid: 54 mg/dL

## 2017-05-13 LAB — MAGNESIUM: MAGNESIUM: 1.8 mg/dL (ref 1.7–2.4)

## 2017-05-13 LAB — SURGICAL PCR SCREEN
MRSA, PCR: NEGATIVE
Staphylococcus aureus: NEGATIVE

## 2017-05-13 LAB — CULTURE, BODY FLUID W GRAM STAIN -BOTTLE

## 2017-05-13 SURGERY — COLECTOMY, WITH COLOSTOMY CREATION
Anesthesia: General | Site: Abdomen

## 2017-05-13 MED ORDER — HEPARIN SODIUM (PORCINE) 5000 UNIT/ML IJ SOLN
5000.0000 [IU] | Freq: Three times a day (TID) | INTRAMUSCULAR | Status: DC
  Administered 2017-05-14 – 2017-05-17 (×10): 5000 [IU] via SUBCUTANEOUS
  Filled 2017-05-13 (×10): qty 1

## 2017-05-13 MED ORDER — ONDANSETRON HCL 4 MG/2ML IJ SOLN
INTRAMUSCULAR | Status: AC
  Filled 2017-05-13: qty 2

## 2017-05-13 MED ORDER — HYDROMORPHONE 1 MG/ML IV SOLN: INTRAVENOUS | Status: DC

## 2017-05-13 MED ORDER — SUGAMMADEX SODIUM 200 MG/2ML IV SOLN
INTRAVENOUS | Status: AC
  Filled 2017-05-13: qty 2

## 2017-05-13 MED ORDER — ROCURONIUM BROMIDE 100 MG/10ML IV SOLN
INTRAVENOUS | Status: DC | PRN
  Administered 2017-05-13: 5 mg via INTRAVENOUS
  Administered 2017-05-13: 45 mg via INTRAVENOUS
  Administered 2017-05-13: 10 mg via INTRAVENOUS

## 2017-05-13 MED ORDER — SODIUM CHLORIDE 0.9 % IV SOLN
250.0000 mL | INTRAVENOUS | Status: DC | PRN
  Administered 2017-05-13 – 2017-05-14 (×2): 250 mL via INTRAVENOUS

## 2017-05-13 MED ORDER — LACTATED RINGERS IV SOLN
INTRAVENOUS | Status: DC
  Administered 2017-05-13: 11:00:00 via INTRAVENOUS

## 2017-05-13 MED ORDER — PANTOPRAZOLE SODIUM 40 MG IV SOLR
40.0000 mg | INTRAVENOUS | Status: DC
  Administered 2017-05-13 – 2017-05-17 (×5): 40 mg via INTRAVENOUS
  Filled 2017-05-13 (×5): qty 40

## 2017-05-13 MED ORDER — ALBUMIN HUMAN 5 % IV SOLN
INTRAVENOUS | Status: DC | PRN
  Administered 2017-05-13: 13:00:00 via INTRAVENOUS

## 2017-05-13 MED ORDER — FENTANYL CITRATE (PF) 100 MCG/2ML IJ SOLN
INTRAMUSCULAR | Status: DC | PRN
  Administered 2017-05-13 (×2): 50 ug via INTRAVENOUS
  Administered 2017-05-13: 100 ug via INTRAVENOUS
  Administered 2017-05-13: 50 ug via INTRAVENOUS

## 2017-05-13 MED ORDER — PHENYLEPHRINE 40 MCG/ML (10ML) SYRINGE FOR IV PUSH (FOR BLOOD PRESSURE SUPPORT)
PREFILLED_SYRINGE | INTRAVENOUS | Status: AC
  Filled 2017-05-13: qty 20

## 2017-05-13 MED ORDER — MIDAZOLAM HCL 5 MG/5ML IJ SOLN
INTRAMUSCULAR | Status: DC | PRN
Start: 2017-05-13 — End: 2017-05-13
  Administered 2017-05-13: 2 mg via INTRAVENOUS

## 2017-05-13 MED ORDER — SODIUM CHLORIDE 0.9% FLUSH: 9.0000 mL | INTRAVENOUS | Status: DC | PRN

## 2017-05-13 MED ORDER — FENTANYL CITRATE (PF) 100 MCG/2ML IJ SOLN
INTRAMUSCULAR | Status: AC
  Filled 2017-05-13: qty 2

## 2017-05-13 MED ORDER — LACTATED RINGERS IV SOLN
INTRAVENOUS | Status: DC | PRN
  Administered 2017-05-13: 11:00:00 via INTRAVENOUS

## 2017-05-13 MED ORDER — 0.9 % SODIUM CHLORIDE (POUR BTL) OPTIME
TOPICAL | Status: DC | PRN
  Administered 2017-05-13: 1000 mL

## 2017-05-13 MED ORDER — FENTANYL CITRATE (PF) 100 MCG/2ML IJ SOLN
25.0000 ug | INTRAMUSCULAR | Status: DC | PRN
Start: 2017-05-13 — End: 2017-05-13
  Administered 2017-05-13 (×2): 50 ug via INTRAVENOUS

## 2017-05-13 MED ORDER — PROPOFOL 10 MG/ML IV BOLUS
INTRAVENOUS | Status: DC | PRN
  Administered 2017-05-13: 100 mg via INTRAVENOUS

## 2017-05-13 MED ORDER — SUGAMMADEX SODIUM 200 MG/2ML IV SOLN
INTRAVENOUS | Status: DC | PRN
  Administered 2017-05-13: 200 mg via INTRAVENOUS

## 2017-05-13 MED ORDER — ACETAMINOPHEN 10 MG/ML IV SOLN
1000.0000 mg | Freq: Four times a day (QID) | INTRAVENOUS | Status: AC
  Administered 2017-05-13 – 2017-05-14 (×3): 1000 mg via INTRAVENOUS
  Filled 2017-05-13 (×3): qty 100

## 2017-05-13 MED ORDER — FENTANYL CITRATE (PF) 250 MCG/5ML IJ SOLN
INTRAMUSCULAR | Status: AC
  Filled 2017-05-13: qty 5

## 2017-05-13 MED ORDER — DIPHENHYDRAMINE HCL 50 MG/ML IJ SOLN
12.5000 mg | Freq: Four times a day (QID) | INTRAMUSCULAR | Status: DC | PRN
  Administered 2017-05-13: 12.5 mg via INTRAVENOUS
  Filled 2017-05-13: qty 1

## 2017-05-13 MED ORDER — MIDAZOLAM HCL 2 MG/2ML IJ SOLN
INTRAMUSCULAR | Status: AC
  Filled 2017-05-13: qty 2

## 2017-05-13 MED ORDER — ONDANSETRON HCL 4 MG/2ML IJ SOLN: 4.0000 mg | Freq: Four times a day (QID) | INTRAMUSCULAR | Status: DC | PRN

## 2017-05-13 MED ORDER — ONDANSETRON HCL 4 MG/2ML IJ SOLN
INTRAMUSCULAR | Status: DC | PRN
  Administered 2017-05-13: 4 mg via INTRAVENOUS

## 2017-05-13 MED ORDER — ROCURONIUM BROMIDE 10 MG/ML (PF) SYRINGE
PREFILLED_SYRINGE | INTRAVENOUS | Status: AC
  Filled 2017-05-13: qty 5

## 2017-05-13 MED ORDER — EPHEDRINE 5 MG/ML INJ
INTRAVENOUS | Status: AC
  Filled 2017-05-13: qty 10

## 2017-05-13 MED ORDER — HYDROMORPHONE 1 MG/ML IV SOLN
INTRAVENOUS | Status: AC
  Filled 2017-05-13: qty 25

## 2017-05-13 MED ORDER — PHENYLEPHRINE HCL 10 MG/ML IJ SOLN
INTRAMUSCULAR | Status: DC | PRN
  Administered 2017-05-13 (×2): 80 ug via INTRAVENOUS

## 2017-05-13 MED ORDER — PHENYLEPHRINE HCL 10 MG/ML IJ SOLN
INTRAVENOUS | Status: DC | PRN
  Administered 2017-05-13: 25 ug/min via INTRAVENOUS

## 2017-05-13 MED ORDER — LIDOCAINE HCL (CARDIAC) 20 MG/ML IV SOLN
INTRAVENOUS | Status: DC | PRN
Start: 2017-05-13 — End: 2017-05-13
  Administered 2017-05-13: 100 mg via INTRAVENOUS

## 2017-05-13 MED ORDER — DIPHENHYDRAMINE HCL 12.5 MG/5ML PO ELIX: 12.5000 mg | ORAL_SOLUTION | Freq: Four times a day (QID) | ORAL | Status: DC | PRN

## 2017-05-13 MED ORDER — HYDROMORPHONE 1 MG/ML IV SOLN
INTRAVENOUS | Status: DC
  Administered 2017-05-13: 0.4 mg via INTRAVENOUS
  Administered 2017-05-13: 0.3 mg via INTRAVENOUS
  Administered 2017-05-13: 0.6 mg via INTRAVENOUS
  Administered 2017-05-13: 14:00:00 via INTRAVENOUS
  Administered 2017-05-14: 0.8 mg via INTRAVENOUS
  Administered 2017-05-14: 1 mg via INTRAVENOUS
  Administered 2017-05-14: 0.6 mg via INTRAVENOUS
  Administered 2017-05-14: 0.8 mg via INTRAVENOUS
  Administered 2017-05-14: 1.4 mg via INTRAVENOUS
  Administered 2017-05-15: 0.4 mg via INTRAVENOUS
  Administered 2017-05-15: 0.6 mg via INTRAVENOUS

## 2017-05-13 MED ORDER — NALOXONE HCL 0.4 MG/ML IJ SOLN: 0.4000 mg | INTRAMUSCULAR | Status: DC | PRN

## 2017-05-13 SURGICAL SUPPLY — 50 items
BIOPATCH RED 1 DISK 7.0 (GAUZE/BANDAGES/DRESSINGS) ×4 IMPLANT
BIOPATCH RED 1IN DISK 7.0MM (GAUZE/BANDAGES/DRESSINGS) ×2
BNDG GAUZE ELAST 4 BULKY (GAUZE/BANDAGES/DRESSINGS) ×3 IMPLANT
CANISTER SUCT 3000ML PPV (MISCELLANEOUS) ×3 IMPLANT
CHLORAPREP W/TINT 26ML (MISCELLANEOUS) ×3 IMPLANT
COVER SURGICAL LIGHT HANDLE (MISCELLANEOUS) ×3 IMPLANT
DRAIN CHANNEL 19F RND (DRAIN) ×6 IMPLANT
DRAPE LAPAROSCOPIC ABDOMINAL (DRAPES) ×3 IMPLANT
DRAPE UTILITY XL STRL (DRAPES) ×3 IMPLANT
DRAPE WARM FLUID 44X44 (DRAPE) ×3 IMPLANT
DRSG PAD ABDOMINAL 8X10 ST (GAUZE/BANDAGES/DRESSINGS) ×3 IMPLANT
DRSG TEGADERM 2-3/8X2-3/4 SM (GAUZE/BANDAGES/DRESSINGS) ×3 IMPLANT
DRSG TEGADERM 4X4.75 (GAUZE/BANDAGES/DRESSINGS) ×3 IMPLANT
ELECT BLADE 6.5 EXT (BLADE) ×3 IMPLANT
ELECT CAUTERY BLADE 6.4 (BLADE) ×3 IMPLANT
ELECT REM PT RETURN 9FT ADLT (ELECTROSURGICAL) ×3
ELECTRODE REM PT RTRN 9FT ADLT (ELECTROSURGICAL) ×1 IMPLANT
EVACUATOR SILICONE 100CC (DRAIN) ×6 IMPLANT
GAUZE SPONGE 2X2 8PLY STRL LF (GAUZE/BANDAGES/DRESSINGS) ×1 IMPLANT
GLOVE BIO SURGEON STRL SZ7 (GLOVE) ×12 IMPLANT
GLOVE BIOGEL PI IND STRL 7.5 (GLOVE) ×2 IMPLANT
GLOVE BIOGEL PI INDICATOR 7.5 (GLOVE) ×4
GOWN STRL REUS W/ TWL LRG LVL3 (GOWN DISPOSABLE) ×6 IMPLANT
GOWN STRL REUS W/TWL LRG LVL3 (GOWN DISPOSABLE) ×12
KIT BASIN OR (CUSTOM PROCEDURE TRAY) ×3 IMPLANT
KIT OSTOMY DRAINABLE 2.75 STR (WOUND CARE) ×3 IMPLANT
KIT ROOM TURNOVER OR (KITS) ×3 IMPLANT
LIGASURE IMPACT 36 18CM CVD LR (INSTRUMENTS) ×3 IMPLANT
NS IRRIG 1000ML POUR BTL (IV SOLUTION) ×3 IMPLANT
PACK GENERAL/GYN (CUSTOM PROCEDURE TRAY) ×3 IMPLANT
PAD ARMBOARD 7.5X6 YLW CONV (MISCELLANEOUS) ×3 IMPLANT
PENCIL BUTTON HOLSTER BLD 10FT (ELECTRODE) ×3 IMPLANT
SPONGE GAUZE 2X2 STER 10/PKG (GAUZE/BANDAGES/DRESSINGS) ×2
SPONGE LAP 18X18 X RAY DECT (DISPOSABLE) ×9 IMPLANT
STAPLER CUT CVD 40MM BLUE (STAPLE) ×3 IMPLANT
SUCTION POOLE TIP (SUCTIONS) ×3 IMPLANT
SUT ETHILON 2 0 FS 18 (SUTURE) ×6 IMPLANT
SUT PDS AB 1 TP1 96 (SUTURE) ×6 IMPLANT
SUT PROLENE 2 0 CT2 30 (SUTURE) ×6 IMPLANT
SUT SILK 2 0 SH CR/8 (SUTURE) ×3 IMPLANT
SUT SILK 2 0 TIES 10X30 (SUTURE) ×3 IMPLANT
SUT SILK 3 0 SH CR/8 (SUTURE) ×3 IMPLANT
SUT SILK 3 0 TIES 10X30 (SUTURE) ×3 IMPLANT
SUT VIC AB 3-0 SH 18 (SUTURE) ×3 IMPLANT
SYR BULB IRRIGATION 50ML (SYRINGE) ×3 IMPLANT
TAPE CLOTH SURG 6X10 WHT LF (GAUZE/BANDAGES/DRESSINGS) ×3 IMPLANT
TRAY FOLEY W/METER SILVER 14FR (SET/KITS/TRAYS/PACK) ×3 IMPLANT
TUBE CONNECTING 12'X1/4 (SUCTIONS) ×1
TUBE CONNECTING 12X1/4 (SUCTIONS) ×2 IMPLANT
YANKAUER SUCT BULB TIP NO VENT (SUCTIONS) ×3 IMPLANT

## 2017-05-13 NOTE — Progress Notes (Signed)
Subjective/Chief Complaint:  no complaints   Objective: Vital signs in last 24 hours: Temp:  [97.9 F (36.6 C)-98.6 F (37 C)] 98.5 F (36.9 C) (11/28 0644) Pulse Rate:  [84-93] 93 (11/28 0644) Resp:  [16] 16 (11/28 0644) BP: (111-115)/(68-74) 115/73 (11/28 0644) SpO2:  [95 %-98 %] 97 % (11/28 0644) Weight:  [50.7 kg (111 lb 12.4 oz)] 50.7 kg (111 lb 12.4 oz) (11/28 0651) Last BM Date: 05/12/17  Intake/Output from previous day: 11/27 0701 - 11/28 0700 In: 660 [P.O.:660] Out: 1190 [Urine:1190] Intake/Output this shift: No intake/output data recorded.  Soft mildly tender bilateral lq, drain in place with some stool  Lab Results:  Recent Labs    05/12/17 0424 05/13/17 0412  WBC 10.6* 11.3*  HGB 10.3* 10.1*  HCT 32.4* 31.9*  PLT 509* 524*   BMET Recent Labs    05/12/17 0424 05/13/17 0412  NA 135 138  K 3.2* 3.5  CL 97* 101  CO2 27 27  GLUCOSE 98 95  BUN 24* 25*  CREATININE 1.26* 1.09  CALCIUM 8.8* 8.9   PT/INR No results for input(s): LABPROT, INR in the last 72 hours. ABG No results for input(s): PHART, HCO3 in the last 72 hours.  Invalid input(s): PCO2, PO2  Studies/Results: Dg Chest Port 1 View  Result Date: 05/11/2017 CLINICAL DATA:  56 year old male with perforated abdominal bowel, abscess. Moderate to large bilateral pleural effusions and bilateral lower lung disease on 05/04/2017. EXAM: PORTABLE CHEST 1 VIEW COMPARISON:  05/10/2017 and earlier. FINDINGS: Portable AP upright view at 1458 hours. Small if any residual bilateral pleural effusions. Normal cardiac size and mediastinal contours. Improved bibasilar ventilation. Regressed but not fully resolved patchy right lower lung opacity. No areas of worsening ventilation. No pneumothorax. No pneumoperitoneum is evident. Negative visible bowel gas pattern. IMPRESSION: 1. Improved bilateral lung base ventilation since 05/10/2017 with regressed but not resolved right lower lung opacity. 2. Small if any  residual pleural effusions. 3. No new cardiopulmonary abnormality. Electronically Signed   By: Genevie Ann M.D.   On: 05/11/2017 15:27    Anti-infectives: Anti-infectives (From admission, onward)   Start     Dose/Rate Route Frequency Ordered Stop   05/08/17 1000  fluconazole (DIFLUCAN) tablet 100 mg     100 mg Oral Daily 05/08/17 0603 05/12/17 0817   05/04/17 0130  piperacillin-tazobactam (ZOSYN) IVPB 3.375 g     3.375 g 12.5 mL/hr over 240 Minutes Intravenous Every 8 hours 05/03/17 2211     04/28/17 1530  vancomycin (VANCOCIN) IVPB 750 mg/150 ml premix  Status:  Discontinued     750 mg 150 mL/hr over 60 Minutes Intravenous Every 24 hours 04/28/17 1424 05/01/17 1128   04/27/17 1400  piperacillin-tazobactam (ZOSYN) IVPB 3.375 g  Status:  Discontinued     3.375 g 12.5 mL/hr over 240 Minutes Intravenous Every 8 hours 04/27/17 1158 05/03/17 2211   04/26/17 1830  vancomycin (VANCOCIN) 1,250 mg in sodium chloride 0.9 % 250 mL IVPB  Status:  Discontinued     1,250 mg 166.7 mL/hr over 90 Minutes Intravenous Every 48 hours 04/26/17 1739 04/28/17 1424   04/24/17 1700  vancomycin (VANCOCIN) IVPB 1000 mg/200 mL premix  Status:  Discontinued     1,000 mg 200 mL/hr over 60 Minutes Intravenous  Once 04/24/17 1652 04/24/17 1656   04/24/17 1700  vancomycin (VANCOCIN) IVPB 1000 mg/200 mL premix  Status:  Discontinued     1,000 mg 200 mL/hr over 60 Minutes Intravenous Every 48 hours 04/24/17  1656 04/26/17 1739   04/23/17 1500  piperacillin-tazobactam (ZOSYN) IVPB 3.375 g  Status:  Discontinued     3.375 g 12.5 mL/hr over 240 Minutes Intravenous Every 12 hours 04/23/17 1107 04/27/17 1158   04/18/17 1800  piperacillin-tazobactam (ZOSYN) IVPB 3.375 g  Status:  Discontinued     3.375 g 12.5 mL/hr over 240 Minutes Intravenous Every 12 hours 04/18/17 1032 04/23/17 1107   04/18/17 1400  piperacillin-tazobactam (ZOSYN) IVPB 3.375 g  Status:  Discontinued     3.375 g 12.5 mL/hr over 240 Minutes Intravenous Every 8  hours 04/18/17 1031 04/18/17 1032   04/17/17 1200  piperacillin-tazobactam (ZOSYN) IVPB 3.375 g  Status:  Discontinued     3.375 g 100 mL/hr over 30 Minutes Intravenous Every 6 hours 04/17/17 1030 04/18/17 1031   04/17/17 1100  piperacillin-tazobactam (ZOSYN) IVPB 3.375 g  Status:  Discontinued     3.375 g 12.5 mL/hr over 240 Minutes Intravenous Every 8 hours 04/17/17 1019 04/17/17 1030   04/15/17 2125  Ampicillin-Sulbactam (UNASYN) 3 g in sodium chloride 0.9 % 100 mL IVPB  Status:  Discontinued     3 g 200 mL/hr over 30 Minutes Intravenous Every 8 hours 04/15/17 1529 04/17/17 1019   04/15/17 1100  ampicillin-sulbactam (UNASYN) 1.5 g in sodium chloride 0.9 % 50 mL IVPB  Status:  Discontinued     1.5 g 100 mL/hr over 30 Minutes Intravenous Every 12 hours 04/15/17 0937 04/15/17 1529   04/13/17 2200  levofloxacin (LEVAQUIN) IVPB 500 mg  Status:  Discontinued     500 mg 100 mL/hr over 60 Minutes Intravenous Every 48 hours 04/12/17 0114 04/12/17 0846   04/13/17 1800  piperacillin-tazobactam (ZOSYN) IVPB 2.25 g  Status:  Discontinued     2.25 g 100 mL/hr over 30 Minutes Intravenous Every 8 hours 04/13/17 1050 04/15/17 0937   04/12/17 0200  piperacillin-tazobactam (ZOSYN) IVPB 3.375 g  Status:  Discontinued     3.375 g 12.5 mL/hr over 240 Minutes Intravenous Every 8 hours 04/12/17 0103 04/13/17 1050   04/12/17 0000  levofloxacin (LEVAQUIN) IVPB 750 mg  Status:  Discontinued     750 mg 100 mL/hr over 90 Minutes Intravenous Every 48 hours 04/11/17 2230 04/12/17 0114      Assessment/Plan: Acute diverticulitis with perforation. [facility] - feculent drainage -Transferred due to respiratory failure10/27/2018. -Earlier hospital course, complicated by DTs, acute respiratory failure with hypoxia, ARDS, aspiration pneumonia and circulatory shock which required intubation and pressors. Also ARF requiring dialysis.Lungs are  improving. 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 Cultures negative Hx of tobacco use Hx of alcohol abuse Anemia Chronic kidney disease  FEN: carb mod/hearthealthy KN:LZJQ stopped 11/16, IV zosyn (11/2=>>day20).Diflucan 11/25 for possible yeast UTI.C. difficile negative. DVT: Heparin to be stopped after 2200 dose tonight then resume 24 hours after surgery     Rolm Bookbinder 05/13/2017

## 2017-05-13 NOTE — Progress Notes (Signed)
Patient ID: Dominic Phillips, male   DOB: 07-12-1960, 56 y.o.   MRN: [id number]  PROGRESS NOTE    Henrick Mcgue  DVV:[id number] DOB: 05-Jan-1961 DOA: 04/11/2017 PCP: No primary care provider on file.   Brief Narrative:  56 year old male with past medical history of alcohol abuse presented to [facility] on 04/02/2017 with abdominal pain without fevers or leukocytosis. CT abdomen revealed pericolonic inflammation/fluid collection and he was admitted for diverticular abscess. General surgery was consulted who suggested placement of percutaneous drain by IR. Cultures grew Escherichia coli. Hospital course complicated by DTs, acute respiratory failure with hypoxia/ARDS/aspiration pneumonia and circulatory shock for which he required intubation and pressors; acute kidney injury for which she required CRRT and hemodialysis 1. Shock has resolved. Currently extubated and on room air. Renal function is improving. Nephrology signed off. Gen. surgery continues to follow.  Assessment & Plan:   Active Problems:   Acute respiratory distress syndrome (ARDS) (HCC)   Acute kidney injury ([place])   Pressure injury of skin   Colonic diverticular abscess   COPD (chronic obstructive pulmonary disease) (HCC)   Dyspnea   HCAP (healthcare-associated pneumonia)   Acute blood loss anemia   Anemia of chronic disease   ETOH abuse   Tobacco abuse   Tachycardia   Tachypnea   Leukocytosis   Post-operative pain   Acute respiratory failure with hypoxia ([place])   Palliative care by specialist   History of ETT   Pleural effusion   Encounter for imaging study to confirm orogastric (OG) tube placement   Endotracheally intubated   Routine adult health maintenance   Acute hypoxic respiratory failure/ARDS with bilateral pleural effusions - Multifactorial second to pulmonary edema versus pleural effusions versus atelectasis versus pneumonia - Currently on room air. Pulmonary following. - Required intubation from  04/11/2017-04/17/17 -Status post thoracentesis on 04/23/2017 with removal of 1.3 L fluid and bilateral thoracentesis on 05/08/2017 with removal of 800 mL from left and 1100 mL from right - Follow chest x-ray intermittently  Diverticulitis with perforation and abscess status post IR drain - General surgery following. Continue Zosyn.  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 - REPEAT CT 05/04/17-No residual fluid collection at site of pigtail drainage catheter in right pelvis.  Hypokalemia -Improved  Acute kidney injury status post CRRT on 04/11/2017 and intermittent dialysis -Renal function improved and stable. Nephrology has signed off -Follow renal function intermittently -HD catheter removed on 04/25/2017  Anemia of chronic disease - Status post 1 unit of packed red cells transfusion -Continue iron replacement -Monitor hemoglobin -No signs of acute bleeding  Thrombocytosis - probably reactive; monitor  Moderate malnutrition of chronic illness -Follow nutrition recommendations   DVT prophylaxis: Heparin Code Status:  Partial with no prolonged mechanical ventilation and intubation Family Communication: Spoke with patient and girlfriend at bedside Disposition Plan: Depends on clinical outcome  Consultants:  General surgery IR PCCM   Procedures: IR drain in [facility] on 04/02/2017 CRRT Hemodialysis 1 Removal of HD dialysis catheter Intubation and extubation from 04/11/2017-04/17/17 Thoracentesis 2  Antimicrobials:  Anti-infectives (From admission, onward)   Start     Dose/Rate Route Frequency Ordered Stop   05/08/17 1000  fluconazole (DIFLUCAN) tablet 100 mg     100 mg Oral Daily 05/08/17 0603 05/12/17 0817   05/04/17 0130  [MAR Hold]  piperacillin-tazobactam (ZOSYN) IVPB 3.375 g     (MAR Hold since 05/13/17 1030)   3.375 g 12.5 mL/hr over 240 Minutes Intravenous  Every 8  hours 05/03/17 2211     04/28/17 1530  vancomycin (VANCOCIN) IVPB 750 mg/150 ml premix  Status:  Discontinued     750 mg 150 mL/hr over 60 Minutes Intravenous Every 24 hours 04/28/17 1424 05/01/17 1128   04/27/17 1400  piperacillin-tazobactam (ZOSYN) IVPB 3.375 g  Status:  Discontinued     3.375 g 12.5 mL/hr over 240 Minutes Intravenous Every 8 hours 04/27/17 1158 05/03/17 2211   04/26/17 1830  vancomycin (VANCOCIN) 1,250 mg in sodium chloride 0.9 % 250 mL IVPB  Status:  Discontinued     1,250 mg 166.7 mL/hr over 90 Minutes Intravenous Every 48 hours 04/26/17 1739 04/28/17 1424   04/24/17 1700  vancomycin (VANCOCIN) IVPB 1000 mg/200 mL premix  Status:  Discontinued     1,000 mg 200 mL/hr over 60 Minutes Intravenous  Once 04/24/17 1652 04/24/17 1656   04/24/17 1700  vancomycin (VANCOCIN) IVPB 1000 mg/200 mL premix  Status:  Discontinued     1,000 mg 200 mL/hr over 60 Minutes Intravenous Every 48 hours 04/24/17 1656 04/26/17 1739   04/23/17 1500  piperacillin-tazobactam (ZOSYN) IVPB 3.375 g  Status:  Discontinued     3.375 g 12.5 mL/hr over 240 Minutes Intravenous Every 12 hours 04/23/17 1107 04/27/17 1158   04/18/17 1800  piperacillin-tazobactam (ZOSYN) IVPB 3.375 g  Status:  Discontinued     3.375 g 12.5 mL/hr over 240 Minutes Intravenous Every 12 hours 04/18/17 1032 04/23/17 1107   04/18/17 1400  piperacillin-tazobactam (ZOSYN) IVPB 3.375 g  Status:  Discontinued     3.375 g 12.5 mL/hr over 240 Minutes Intravenous Every 8 hours 04/18/17 1031 04/18/17 1032   04/17/17 1200  piperacillin-tazobactam (ZOSYN) IVPB 3.375 g  Status:  Discontinued     3.375 g 100 mL/hr over 30 Minutes Intravenous Every 6 hours 04/17/17 1030 04/18/17 1031   04/17/17 1100  piperacillin-tazobactam (ZOSYN) IVPB 3.375 g  Status:  Discontinued     3.375 g 12.5 mL/hr over 240 Minutes Intravenous Every 8 hours 04/17/17 1019 04/17/17 1030   04/15/17 2125  Ampicillin-Sulbactam (UNASYN) 3 g in sodium chloride 0.9 % 100  mL IVPB  Status:  Discontinued     3 g 200 mL/hr over 30 Minutes Intravenous Every 8 hours 04/15/17 1529 04/17/17 1019   04/15/17 1100  ampicillin-sulbactam (UNASYN) 1.5 g in sodium chloride 0.9 % 50 mL IVPB  Status:  Discontinued     1.5 g 100 mL/hr over 30 Minutes Intravenous Every 12 hours 04/15/17 0937 04/15/17 1529   04/13/17 2200  levofloxacin (LEVAQUIN) IVPB 500 mg  Status:  Discontinued     500 mg 100 mL/hr over 60 Minutes Intravenous Every 48 hours 04/12/17 0114 04/12/17 0846   04/13/17 1800  piperacillin-tazobactam (ZOSYN) IVPB 2.25 g  Status:  Discontinued     2.25 g 100 mL/hr over 30 Minutes Intravenous Every 8 hours 04/13/17 1050 04/15/17 0937   04/12/17 0200  piperacillin-tazobactam (ZOSYN) IVPB 3.375 g  Status:  Discontinued     3.375 g 12.5 mL/hr over 240 Minutes Intravenous Every 8 hours 04/12/17 0103 04/13/17 1050   04/12/17 0000  levofloxacin (LEVAQUIN) IVPB 750 mg  Status:  Discontinued     750 mg 100 mL/hr over 90 Minutes Intravenous Every 48 hours 04/11/17 2230 04/12/17 0114       Subjective: Patient seen and examined at bedside. No overnight fever, nausea or vomiting.  No cough or shortness of breath  Objective: Vitals:   05/12/17 2228 05/13/17 6269 05/13/17 4854  05/13/17 1034  BP: 112/68 115/73    Pulse: 91 93    Resp: 16 16    Temp: 98.6 F (37 C) 98.5 F (36.9 C)    TempSrc: Oral Oral    SpO2: 95% 97%    Weight:   50.7 kg (111 lb 12.4 oz) 50.3 kg (111 lb)  Height:    5\' 11"  (1.803 m)    Intake/Output Summary (Last 24 hours) at 05/13/2017 1057 Last data filed at 05/13/2017 0234 Gross per 24 hour  Intake 440 ml  Output 890 ml  Net -450 ml   Filed Weights   05/12/17 0400 05/13/17 0651 05/13/17 1034  Weight: 50 kg (110 lb 3.7 oz) 50.7 kg (111 lb 12.4 oz) 50.3 kg (111 lb)    Examination:  General exam: Appears calm and comfortable Respiratory system: Bilateral decreased breath sound at bases Cardiovascular system: S1 & S2 heard, rate  controlled  Gastrointestinal system: Abdomen is nondistended, soft and nontender. Normal bowel sounds heard.  Abdominal drain present Extremities: No cyanosis, clubbing, edema   Data Reviewed: I have personally reviewed following labs and imaging studies  CBC: Recent Labs  Lab 05/07/17 0205 05/12/17 0424 05/13/17 0412  WBC 11.9* 10.6* 11.3*  NEUTROABS  --  6.7 7.1  HGB 9.7* 10.3* 10.1*  HCT 30.6* 32.4* 31.9*  MCV 95.3 94.5 94.4  PLT 482* 509* 696*   Basic Metabolic Panel: Recent Labs  Lab 05/09/17 0334 05/09/17 0830 05/10/17 0428 05/11/17 0402 05/12/17 0424 05/13/17 0412  NA 137  --  138 137 135 138  K 2.8*  --  3.8 3.5 3.2* 3.5  CL 97*  --  102 99* 97* 101  CO2 28  --  27 28 27 27   GLUCOSE 104*  --  99 111* 98 95  BUN 29*  --  26* 26* 24* 25*  CREATININE 1.41*  --  1.32* 1.20 1.26* 1.09  CALCIUM 8.3*  --  8.5* 8.7* 8.8* 8.9  MG  --  1.8 2.3  --  1.7 1.8   GFR: Estimated Creatinine Clearance: 55.1 mL/min (by C-G formula based on SCr of 1.09 mg/dL). Liver Function Tests: Recent Labs  Lab 05/08/17 1539  PROT 6.3*   No results for input(s): LIPASE, AMYLASE in the last 168 hours. No results for input(s): AMMONIA in the last 168 hours. Coagulation Profile: No results for input(s): INR, PROTIME in the last 168 hours. Cardiac Enzymes: No results for input(s): CKTOTAL, CKMB, CKMBINDEX, TROPONINI in the last 168 hours. BNP (last 3 results) No results for input(s): PROBNP in the last 8760 hours. HbA1C: No results for input(s): HGBA1C in the last 72 hours. CBG: Recent Labs  Lab 05/08/17 0808 05/09/17 1211  GLUCAP 96 97   Lipid Profile: No results for input(s): CHOL, HDL, LDLCALC, TRIG, CHOLHDL, LDLDIRECT in the last 72 hours. Thyroid Function Tests: No results for input(s): TSH, T4TOTAL, FREET4, T3FREE, THYROIDAB in the last 72 hours. Anemia Panel: No results for input(s): VITAMINB12, FOLATE, FERRITIN, TIBC, IRON, RETICCTPCT in the last 72 hours. Sepsis  Labs: No results for input(s): PROCALCITON, LATICACIDVEN in the last 168 hours.  Recent Results (from the past 240 hour(s))  Culture, body fluid-bottle     Status: None   Collection Time: 05/05/17  2:49 PM  Result Value Ref Range Status   Specimen Description FLUID PERITONEAL  Final   Special Requests BOTTLES DRAWN AEROBIC AND ANAEROBIC  Final   Culture NO GROWTH 5 DAYS  Final   Report Status 05/10/2017  FINAL  Final  Gram stain     Status: None   Collection Time: 05/05/17  2:49 PM  Result Value Ref Range Status   Specimen Description FLUID PERITONEAL  Final   Special Requests NONE  Final   Gram Stain   Final    CYTOSPIN SMEAR WBC PRESENT,BOTH PMN AND MONONUCLEAR NO ORGANISMS SEEN    Report Status 05/05/2017 FINAL  Final  Urine Culture     Status: Abnormal   Collection Time: 05/06/17  9:18 AM  Result Value Ref Range Status   Specimen Description URINE, RANDOM  Final   Special Requests NONE  Final   Culture 20,000 COLONIES/mL YEAST (A)  Final   Report Status 05/07/2017 FINAL  Final  C difficile quick scan w PCR reflex     Status: None   Collection Time: 05/06/17  9:18 AM  Result Value Ref Range Status   C Diff antigen NEGATIVE NEGATIVE Final   C Diff toxin NEGATIVE NEGATIVE Final   C Diff interpretation No C. difficile detected.  Final  Gram stain     Status: None   Collection Time: 05/08/17  3:15 PM  Result Value Ref Range Status   Specimen Description PLEURAL  Final   Special Requests RIGHT  Final   Gram Stain   Final    WBC PRESENT, PREDOMINANTLY MONONUCLEAR NO ORGANISMS SEEN CYTOSPIN SMEAR    Report Status 05/09/2017 FINAL  Final  Culture, body fluid-bottle     Status: None (Preliminary result)   Collection Time: 05/08/17  3:15 PM  Result Value Ref Range Status   Specimen Description PLEURAL  Final   Special Requests RIGHT  Final   Culture NO GROWTH 4 DAYS  Final   Report Status PENDING  Incomplete  Surgical pcr screen     Status: None   Collection Time:  05/13/17  4:41 AM  Result Value Ref Range Status   MRSA, PCR NEGATIVE NEGATIVE Final   Staphylococcus aureus NEGATIVE NEGATIVE Final    Comment: (NOTE) The Xpert SA Assay (FDA approved for NASAL specimens in patients 65 years of age and older), is one component of a comprehensive surveillance program. It is not intended to diagnose infection nor to guide or monitor treatment.          Radiology Studies: Dg Chest Port 1 View  Result Date: 05/11/2017 CLINICAL DATA:  56 year old male with perforated abdominal bowel, abscess. Moderate to large bilateral pleural effusions and bilateral lower lung disease on 05/04/2017. EXAM: PORTABLE CHEST 1 VIEW COMPARISON:  05/10/2017 and earlier. FINDINGS: Portable AP upright view at 1458 hours. Small if any residual bilateral pleural effusions. Normal cardiac size and mediastinal contours. Improved bibasilar ventilation. Regressed but not fully resolved patchy right lower lung opacity. No areas of worsening ventilation. No pneumothorax. No pneumoperitoneum is evident. Negative visible bowel gas pattern. IMPRESSION: 1. Improved bilateral lung base ventilation since 05/10/2017 with regressed but not resolved right lower lung opacity. 2. Small if any residual pleural effusions. 3. No new cardiopulmonary abnormality. Electronically Signed   By: Genevie Ann M.D.   On: 05/11/2017 15:27        Scheduled Meds: . [MAR Hold] feeding supplement (ENSURE ENLIVE)  237 mL Oral QID  . [MAR Hold] feeding supplement (PRO-STAT SUGAR FREE 64)  30 mL Oral BID  . [MAR Hold] folic acid  1 mg Oral QHS  . [MAR Hold] guaiFENesin  1,200 mg Oral BID  . [MAR Hold] mouth rinse  15 mL Mouth Rinse BID  . [  MAR Hold] multivitamin with minerals  1 tablet Oral Daily  . [MAR Hold] pantoprazole  40 mg Oral QHS  . [MAR Hold] saccharomyces boulardii  250 mg Oral BID  . [MAR Hold] thiamine  100 mg Oral QHS  . [MAR Hold] traZODone  50 mg Oral QHS   Continuous Infusions: . [MAR Hold] sodium  chloride    . lactated ringers 10 mL/hr at 05/13/17 1041  . [MAR Hold] piperacillin-tazobactam (ZOSYN)  IV Stopped (05/13/17 0813)     LOS: 34 days        Aline August, MD [facility] Pager [phone]  If 7PM-7AM, please contact night-coverage [email or web address] Password [facility] 05/13/2017, 10:57 AM

## 2017-05-13 NOTE — Anesthesia Postprocedure Evaluation (Signed)
Anesthesia Post Note  Patient: Dominic Phillips  Procedure(s) Performed: COLECTOMY WITH COLOSTOMY CREATION/HARTMANN PROCEDURE (N/A Abdomen)     Patient location during evaluation: PACU Anesthesia Type: General Level of consciousness: awake and alert Pain management: pain level controlled Vital Signs Assessment: post-procedure vital signs reviewed and stable Respiratory status: spontaneous breathing, nonlabored ventilation, respiratory function stable and patient connected to nasal cannula oxygen Cardiovascular status: blood pressure returned to baseline and stable Postop Assessment: no apparent nausea or vomiting Anesthetic complications: no    Last Vitals:  Vitals:   05/13/17 1534 05/13/17 1545  BP: 115/85   Pulse: (!) 112 (!) 111  Resp: 16 15  Temp:    SpO2: 96% 98%    Last Pain:  Vitals:   05/13/17 1534  TempSrc:   PainSc: Asleep                 [name]

## 2017-05-13 NOTE — Progress Notes (Signed)
1637 Received patient from PACU. Patient is sleeping at this time. Dressing clean, dry, and intact.

## 2017-05-13 NOTE — Progress Notes (Signed)
CCMD called pt just had 6 beats of V Tach. Pt asleep. No other symptoms. Will continue to monitor pt.

## 2017-05-13 NOTE — Anesthesia Procedure Notes (Addendum)
Procedure Name: Intubation Date/Time: 05/13/2017 11:29 AM Performed by: Kyung Rudd, CRNA Pre-anesthesia Checklist: Patient identified, Emergency Drugs available, Suction available, Patient being monitored and Timeout performed Patient Re-evaluated:Patient Re-evaluated prior to induction Oxygen Delivery Method: Circle system utilized Preoxygenation: Pre-oxygenation with 100% oxygen Induction Type: IV induction Ventilation: Oral airway inserted - appropriate to patient size and Mask ventilation without difficulty Laryngoscope Size: Mac and 4 Grade View: Grade I Tube size: 7.5 mm Number of attempts: 1 Airway Equipment and Method: Stylet Placement Confirmation: ETT inserted through vocal cords under direct vision,  positive ETCO2 and breath sounds checked- equal and bilateral Secured at: 23 cm Tube secured with: Tape Dental Injury: Teeth and Oropharynx as per pre-operative assessment  Comments: AOI Intubation per Chrissie Noa, with Dr. Seward Speck supervising.

## 2017-05-13 NOTE — Transfer of Care (Signed)
Immediate Anesthesia Transfer of Care Note  Patient: Dominic Phillips  Procedure(s) Performed: COLECTOMY WITH COLOSTOMY CREATION/HARTMANN PROCEDURE (N/A Abdomen)  Patient Location: PACU  Anesthesia Type:General  Level of Consciousness: awake, alert  and oriented  Airway & Oxygen Therapy: Patient Spontanous Breathing and Patient connected to face mask oxygen  Post-op Assessment: Report given to RN, Post -op Vital signs reviewed and stable and Patient moving all extremities X 4  Post vital signs: Reviewed and stable  Last Vitals:  Vitals:   05/12/17 2228 05/13/17 0644  BP: 112/68 115/73  Pulse: 91 93  Resp: 16 16  Temp: 37 C 36.9 C  SpO2: 95% 97%    Last Pain:  Vitals:   05/13/17 0713  TempSrc:   PainSc: Asleep      Patients Stated Pain Goal: 2 (19/37/90 2409)  Complications: No apparent anesthesia complications

## 2017-05-13 NOTE — Anesthesia Preprocedure Evaluation (Addendum)
Anesthesia Evaluation  Patient identified by MRN, date of birth, ID band Patient awake    Reviewed: Allergy & Precautions, H&P , NPO status , Patient's Chart, lab work & pertinent test results, reviewed documented beta blocker date and time   Airway Mallampati: II  TM Distance: >3 FB Neck ROM: full    Dental no notable dental hx. (+) Edentulous Upper, Poor Dentition, Dental Advisory Given   Pulmonary Current Smoker,    Pulmonary exam normal breath sounds clear to auscultation       Cardiovascular  Rhythm:regular Rate:Normal     Neuro/Psych    GI/Hepatic   Endo/Other    Renal/GU      Musculoskeletal   Abdominal   Peds  Hematology   Anesthesia Other Findings 55% EF; No valvular problems Hx of Resp failure with initial illness; now extubated Pleural effusions drained  Reproductive/Obstetrics                           Anesthesia Physical Anesthesia Plan  ASA: II  Anesthesia Plan: General   Post-op Pain Management:    Induction: Intravenous  PONV Risk Score and Plan:   Airway Management Planned: Oral ETT  Additional Equipment:   Intra-op Plan:   Post-operative Plan: Extubation in OR  Informed Consent: I have reviewed the patients History and Physical, chart, labs and discussed the procedure including the risks, benefits and alternatives for the proposed anesthesia with the patient or authorized representative who has indicated his/her understanding and acceptance.   Dental Advisory Given  Plan Discussed with: CRNA and Surgeon  Anesthesia Plan Comments: (  )        Anesthesia Quick Evaluation

## 2017-05-13 NOTE — Op Note (Signed)
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 Serita Grammes Assistant: Dr. Verita Lamb Anesthesia: General Estimated blood loss: 100 cc Specimens: Sigmoid colon to pathology Drains: 219 French Blake drains to pelvis Complications: None Special count was correct at completion Disposition to recovery stable  Indications: This is a 56 year old male who has had diverticulitis for several weeks.  He was transferred here with ARDS and has been managed by the critical care service.  This is improved.  He has had issues with alcohol withdrawal as well as his nutrition during this time.  He ends up with a colocutaneous fistula and is not really improving at this point.  I discussed going to the operating room for a Hartman's procedure.  We discussed risks prior to beginning.  Procedure: After informed consent was obtained the patient was taken to the operating room.  He was on antibiotics.  He had SCDs in place.  He was placed under general anesthesia without complication.  He had a nasogastric tube and a Foley catheter placed.  He was then prepped and draped in the standard sterile surgical fashion.  A surgical timeout was then performed.  I made a low midline incision that extended just to up above the umbilicus.  I entered into his abdomen.  He had a fair amount of free fluid in his abdomen.  He had a lot of adhesions to his small bowel and the small bowel was very adherent to his pelvis.  I took about an hour to release all the small bowel adhesions and to bring the small bowel out of the pelvis.  There were 2 areas where I made serosal tears that I fixed with 3-0 silk sutures.  I ran the small bowel several times and there was no other evidence of any serosal tears or any operative injury.  I then was able to identify the sigmoid colon.  When I entered into the left lateral space there was  about a 500 cc collection of some cloudy fluid.  I evacuated this in its entirety.  I then used cautery to release the white line of Toldt.  Once I had done this I entered into the pelvis.  His pelvis was very scarred in and it was almost like concrete.  I was able to dissect along the colon using blunt dissection.  I eventually was able to identify the cavity where the drain was.  I removed the interventional radiology drain.  At this point in time both Dr. Georgette Dover and I agreed that to further try to remove the remaining sigmoid that was diseased would likely cause injury.  We elected to then divide the sigmoid colon proximally with a GIA stapler.  I then used the LigaSure device to divide some of the sigmoid colon mesentery and remove the sigmoid down onto the upper portion of what appeared to be the rectum.  I then used the contour stapler to staple across this.  I did not feel it would be safe to remove the remaining disease.  I then placed 2 19 French Blake drains in his pelvis I secured the rectal stump to the sidewall using a 2-0 Prolene suture for possible re-anastomosis at a later date.  I then irrigated copiously.  Hemostasis was observed.  I secured the drains with 2-0 nylon suture.  I then used the prior mark from for his colostomy.  I then made an incision and divided the fascia.  I then  brought the colon through the site.  I then placed the omentum underlying the incision.  I closed the incision with #1 looped PDS.  I then matured the colostomy with 3-0 Vicryl suture.  This was viable.  A colostomy bag and a wet-to-dry dressing was placed.  He tolerated this well was transferred to recovery room.

## 2017-05-14 ENCOUNTER — Encounter (HOSPITAL_COMMUNITY): Payer: Self-pay | Admitting: General Surgery

## 2017-05-14 LAB — BASIC METABOLIC PANEL
Anion gap: 10 (ref 5–15)
BUN: 19 mg/dL (ref 6–20)
CO2: 25 mmol/L (ref 22–32)
Calcium: 8.4 mg/dL — ABNORMAL LOW (ref 8.9–10.3)
Chloride: 104 mmol/L (ref 101–111)
Creatinine, Ser: 1.29 mg/dL — ABNORMAL HIGH (ref 0.61–1.24)
Glucose, Bld: 110 mg/dL — ABNORMAL HIGH (ref 65–99)
POTASSIUM: 3.9 mmol/L (ref 3.5–5.1)
SODIUM: 139 mmol/L (ref 135–145)

## 2017-05-14 LAB — GLUCOSE, CAPILLARY: Glucose-Capillary: 98 mg/dL (ref 65–99)

## 2017-05-14 LAB — CBC WITH DIFFERENTIAL/PLATELET
BASOS PCT: 0 %
Basophils Absolute: 0 10*3/uL (ref 0.0–0.1)
Eosinophils Absolute: 0 10*3/uL (ref 0.0–0.7)
Eosinophils Relative: 0 %
HEMATOCRIT: 33.6 % — AB (ref 39.0–52.0)
HEMOGLOBIN: 10.4 g/dL — AB (ref 13.0–17.0)
LYMPHS ABS: 1.8 10*3/uL (ref 0.7–4.0)
LYMPHS PCT: 10 %
MCH: 29.4 pg (ref 26.0–34.0)
MCHC: 31 g/dL (ref 30.0–36.0)
MCV: 94.9 fL (ref 78.0–100.0)
MONOS PCT: 7 %
Monocytes Absolute: 1.3 10*3/uL — ABNORMAL HIGH (ref 0.1–1.0)
NEUTROS ABS: 14.1 10*3/uL — AB (ref 1.7–7.7)
NEUTROS PCT: 83 %
PLATELETS: 494 10*3/uL — AB (ref 150–400)
RBC: 3.54 MIL/uL — ABNORMAL LOW (ref 4.22–5.81)
RDW: 16 % — ABNORMAL HIGH (ref 11.5–15.5)
WBC: 17.2 10*3/uL — ABNORMAL HIGH (ref 4.0–10.5)

## 2017-05-14 LAB — PREALBUMIN: Prealbumin: 19.1 mg/dL (ref 18–38)

## 2017-05-14 LAB — MAGNESIUM: MAGNESIUM: 1.7 mg/dL (ref 1.7–2.4)

## 2017-05-14 MED ORDER — SODIUM CHLORIDE 0.9% FLUSH
10.0000 mL | INTRAVENOUS | Status: DC | PRN
  Administered 2017-05-15 – 2017-05-19 (×3): 10 mL
  Filled 2017-05-14 (×3): qty 40

## 2017-05-14 MED ORDER — ACETAMINOPHEN 10 MG/ML IV SOLN
1000.0000 mg | Freq: Four times a day (QID) | INTRAVENOUS | Status: AC
  Administered 2017-05-14 – 2017-05-15 (×4): 1000 mg via INTRAVENOUS
  Filled 2017-05-14 (×4): qty 100

## 2017-05-14 MED ORDER — INSULIN ASPART 100 UNIT/ML ~~LOC~~ SOLN: 0.0000 [IU] | SUBCUTANEOUS | Status: DC

## 2017-05-14 MED ORDER — INSULIN ASPART 100 UNIT/ML ~~LOC~~ SOLN
0.0000 [IU] | Freq: Four times a day (QID) | SUBCUTANEOUS | Status: DC
  Administered 2017-05-16: 1 [IU] via SUBCUTANEOUS
  Administered 2017-05-16: 2 [IU] via SUBCUTANEOUS

## 2017-05-14 MED ORDER — METHOCARBAMOL 1000 MG/10ML IJ SOLN
500.0000 mg | Freq: Three times a day (TID) | INTRAVENOUS | Status: DC
  Administered 2017-05-14 – 2017-05-15 (×3): 500 mg via INTRAVENOUS
  Filled 2017-05-14 (×5): qty 5

## 2017-05-14 MED ORDER — ACETAMINOPHEN 10 MG/ML IV SOLN: 1000.0000 mg | Freq: Four times a day (QID) | INTRAVENOUS | Status: DC

## 2017-05-14 NOTE — Care Management Note (Addendum)
Case Management Note  Patient Details  Name: Dominic Phillips MRN: [id number] Date of Birth: 15-Jan-1961  Subjective/Objective:                    Action/Plan: Dominic Phillips with [facility] returned call. In past they gave patient paperwork to complete for charity care and he never returned paperwork . Patient will have to go to [facility] pick up paperwork before they could provide services. NCM asked Dominic Phillips if she could fax paperwork to me to give to patient. Dominic Phillips transferred me to Katharine Look , Armed forces technical officer , NCM left voicemail. Await call back.   Ordered PT/OT evals.   Prior to surgery PT recommending CIR. Await post op PT eval.   Patient uninsured lives in [place] . [facility] does not cover [place]. Patient was transferred to [facility] from [facility] on 04/11/17. [place] [facility] spoke to [place] , she will check with her supervisor to see if patient discharges home, if they can provide home health.  Expected Discharge Date:                  Expected Discharge Plan:     In-House Referral:  Clinical Social Work  Discharge planning Services  CM Consult  Post Acute Care Choice:    Choice offered to:     DME Arranged:    DME Agency:     HH Arranged:    HH Agency:     Status of Service:  In process, will continue to follow  If discussed at Long Length of Stay Meetings, dates discussed:    Additional Comments:  Marilu Favre, RN 05/14/2017, 12:44 PM

## 2017-05-14 NOTE — Progress Notes (Signed)
[facility] Progress Note  1 Day Post-Op  Subjective: CC:  Rates pain as 10/10 all over his abdomen. Pulled out his NGT last night - states that he "was crazy" and attributes this to medications but unsure which meds. Per nursing staff he was dreaming. Denies flatus or BM. Denies fever, chills, nausea, vomiting. Endorses mild intermittent belching. Does not want NGT replaced. Requesting to drink.  Objective: Vital signs in last 24 hours: Temp:  [97.2 F (36.2 C)-98.7 F (37.1 C)] 98.1 F (36.7 C) (11/29 0607) Pulse Rate:  [96-116] 105 (11/29 0607) Resp:  [14-28] 18 (11/29 0817) BP: (109-145)/(67-88) 109/67 (11/29 0607) SpO2:  [94 %-100 %] 95 % (11/29 0817) Weight:  [50.3 kg (110 lb 14.3 oz)-50.3 kg (111 lb)] 50.3 kg (110 lb 14.3 oz) (11/28 1719) Last BM Date: 05/13/17  Intake/Output from previous day: 11/28 0701 - 11/29 0700 In: 1896.3 [P.O.:60; I.V.:1436.3; IV Piggyback:400] Out: 1445 [Urine:1025; Emesis/NG output:200; Drains:170; Blood:50] Intake/Output this shift: Total I/O In: -  Out: 30 [Drains:30]  PE: Gen:  Alert, NAD, cooperative Card:  Regular rate and rhythm, pedal pulses 2+ BL Pulm:  Normal effort, clear to auscultation bilaterally with diminished breath sounds in lung bases Abd: Soft, appropriately tender, midline incision covered, RLQ q/ 2 JP drains (170 cc/24h) and sanguinous output, LLQ stoma viable with small amount SS drainage in pouch, no gas or stool.  Skin: warm and dry, no rashes  Psych: A&Ox3   Lab Results:  Recent Labs    05/13/17 2325 05/14/17 0522  WBC 19.0* 17.2*  HGB 11.9* 10.4*  HCT 37.5* 33.6*  PLT 501* 494*   BMET Recent Labs    05/13/17 0412 05/14/17 0522  NA 138 139  K 3.5 3.9  CL 101 104  CO2 27 25  GLUCOSE 95 110*  BUN 25* 19  CREATININE 1.09 1.29*  CALCIUM 8.9 8.4*   PT/INR No results for input(s): LABPROT, INR in the last 72 hours. CMP     Component Value Date/Time   NA 139 05/14/2017 0522   K 3.9  05/14/2017 0522   CL 104 05/14/2017 0522   CO2 25 05/14/2017 0522   GLUCOSE 110 (H) 05/14/2017 0522   BUN 19 05/14/2017 0522   CREATININE 1.29 (H) 05/14/2017 0522   CALCIUM 8.4 (L) 05/14/2017 0522   PROT 6.3 (L) 05/08/2017 1539   ALBUMIN 1.7 (L) 05/04/2017 0216   AST 13 (L) 05/04/2017 0216   ALT 6 (L) 05/04/2017 0216   ALKPHOS 73 05/04/2017 0216   BILITOT 0.5 05/04/2017 0216   GFRNONAA >60 05/14/2017 0522   GFRAA >60 05/14/2017 0522   Lipase  No results found for: LIPASE     Studies/Results: No results found.  Anti-infectives: Anti-infectives (From admission, onward)   Start     Dose/Rate Route Frequency Ordered Stop   05/08/17 1000  fluconazole (DIFLUCAN) tablet 100 mg     100 mg Oral Daily 05/08/17 0603 05/12/17 0817   05/04/17 0130  piperacillin-tazobactam (ZOSYN) IVPB 3.375 g     3.375 g 12.5 mL/hr over 240 Minutes Intravenous Every 8 hours 05/03/17 2211     04/28/17 1530  vancomycin (VANCOCIN) IVPB 750 mg/150 ml premix  Status:  Discontinued     750 mg 150 mL/hr over 60 Minutes Intravenous Every 24 hours 04/28/17 1424 05/01/17 1128   04/27/17 1400  piperacillin-tazobactam (ZOSYN) IVPB 3.375 g  Status:  Discontinued     3.375 g 12.5 mL/hr over 240 Minutes Intravenous Every 8 hours 04/27/17  1158 05/03/17 2211   04/26/17 1830  vancomycin (VANCOCIN) 1,250 mg in sodium chloride 0.9 % 250 mL IVPB  Status:  Discontinued     1,250 mg 166.7 mL/hr over 90 Minutes Intravenous Every 48 hours 04/26/17 1739 04/28/17 1424   04/24/17 1700  vancomycin (VANCOCIN) IVPB 1000 mg/200 mL premix  Status:  Discontinued     1,000 mg 200 mL/hr over 60 Minutes Intravenous  Once 04/24/17 1652 04/24/17 1656   04/24/17 1700  vancomycin (VANCOCIN) IVPB 1000 mg/200 mL premix  Status:  Discontinued     1,000 mg 200 mL/hr over 60 Minutes Intravenous Every 48 hours 04/24/17 1656 04/26/17 1739   04/23/17 1500  piperacillin-tazobactam (ZOSYN) IVPB 3.375 g  Status:  Discontinued     3.375 g 12.5  mL/hr over 240 Minutes Intravenous Every 12 hours 04/23/17 1107 04/27/17 1158   04/18/17 1800  piperacillin-tazobactam (ZOSYN) IVPB 3.375 g  Status:  Discontinued     3.375 g 12.5 mL/hr over 240 Minutes Intravenous Every 12 hours 04/18/17 1032 04/23/17 1107   04/18/17 1400  piperacillin-tazobactam (ZOSYN) IVPB 3.375 g  Status:  Discontinued     3.375 g 12.5 mL/hr over 240 Minutes Intravenous Every 8 hours 04/18/17 1031 04/18/17 1032   04/17/17 1200  piperacillin-tazobactam (ZOSYN) IVPB 3.375 g  Status:  Discontinued     3.375 g 100 mL/hr over 30 Minutes Intravenous Every 6 hours 04/17/17 1030 04/18/17 1031   04/17/17 1100  piperacillin-tazobactam (ZOSYN) IVPB 3.375 g  Status:  Discontinued     3.375 g 12.5 mL/hr over 240 Minutes Intravenous Every 8 hours 04/17/17 1019 04/17/17 1030   04/15/17 2125  Ampicillin-Sulbactam (UNASYN) 3 g in sodium chloride 0.9 % 100 mL IVPB  Status:  Discontinued     3 g 200 mL/hr over 30 Minutes Intravenous Every 8 hours 04/15/17 1529 04/17/17 1019   04/15/17 1100  ampicillin-sulbactam (UNASYN) 1.5 g in sodium chloride 0.9 % 50 mL IVPB  Status:  Discontinued     1.5 g 100 mL/hr over 30 Minutes Intravenous Every 12 hours 04/15/17 0937 04/15/17 1529   04/13/17 2200  levofloxacin (LEVAQUIN) IVPB 500 mg  Status:  Discontinued     500 mg 100 mL/hr over 60 Minutes Intravenous Every 48 hours 04/12/17 0114 04/12/17 0846   04/13/17 1800  piperacillin-tazobactam (ZOSYN) IVPB 2.25 g  Status:  Discontinued     2.25 g 100 mL/hr over 30 Minutes Intravenous Every 8 hours 04/13/17 1050 04/15/17 0937   04/12/17 0200  piperacillin-tazobactam (ZOSYN) IVPB 3.375 g  Status:  Discontinued     3.375 g 12.5 mL/hr over 240 Minutes Intravenous Every 8 hours 04/12/17 0103 04/13/17 1050   04/12/17 0000  levofloxacin (LEVAQUIN) IVPB 750 mg  Status:  Discontinued     750 mg 100 mL/hr over 90 Minutes Intravenous Every 48 hours 04/11/17 2230 04/12/17 0114       Assessment/Plan Acute  diverticulitis with perforation. [facility] - feculent drainage -Transferred due to respiratory failure10/27/2018. -Earlier hospital course, complicated by DTs, acute respiratory failure with hypoxia, ARDS, aspiration pneumonia and circulatory shock which required intubation and pressors. Also ARF requiring dialysis.Lungs are improving.  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 POD#1 S/P Sigmoid colectomy, end colostomy 05/13/17 Dr. Rolm Bookbinder  - afebrile, mild sinus tachycardia (105 bpm), WBC 17.2 from 19 - accidentally pulled out NGT - recommended replacing. Pt refuses. Discussed that it will need to be replaced if patient develops worsening distention/nausea/emesis.  - await  bowel function; consult WOC for ostomy education.  - BID wet to dry to midline incision. - pain control: IV tylenol, dilaudid PCA, 500 mg robaxin q 8h - OOB/mobilize and use IS  - D/C foley today  pleural effusions -Successful bilateral thoracentesis11/23; CCM recs repeat CXR11/26 showed improved b/l lung base ventilation, small if any residual pleural effusions Loculated left-sided ascites.600 mL aspirated. Cultures negative. Hx of tobacco use Hx of alcohol abuse Anemia - hgb 10.4, hct 33.6, follow  Chronic kidney disease   FEN:NPO, IVF; total protein 11/23 6.3, last pre-albumin 8.7 on 11/19 will recheck today;pt has had poor oral intake since he was placed on a diet multiple weeks ago and may benefit from TNA in next 24-48h due to malnutrition and expected post-op ileus/NPO status. TK:WIOX stopped 11/16, IV zosyn (11/2=>>day27).Diflucan 11/25 for possible yeast UTI.C. difficile negative.will discuss duration of abx tx with MD.  DVT: SQ heparin Foley: D/C today 11/29  Follow up: Dr. Rolm Bookbinder     LOS: 80 days    Jill Alexanders , [facility] 05/14/2017, 8:44  AM Pager: [phone] Consults: [phone] Mon-Fri 7:00 am-4:30 pm Sat-Sun 7:00 am-11:30 am

## 2017-05-14 NOTE — Progress Notes (Signed)
PT Cancellation Note  Patient Details Name: Dominic Phillips MRN: [id number] DOB: 1960/12/25   Cancelled Treatment:    Reason Eval/Treat Not Completed: Pain limiting ability to participate;Medical issues which prohibited therapy. Chart reviewed, RN consulted. Holding pt treatment at this time due to uncontrolled pain, 10/10. RN reports waiting for IV team to place access, but currently, patient is unable to utilize PCA. Will attempt again at later date/time.   2:10 PM, 05/14/17 Etta Grandchild, PT, DPT Relief Physical Therapist - [place] [phone] (Pager)  [phone] [facility])  [phone] (Office)      Lavi Sheehan C 05/14/2017, 2:09 PM

## 2017-05-14 NOTE — Progress Notes (Signed)
Inpatient Rehabilitation  Continuing to follow for timing of medical readiness, therapy tolerance, patient's decision, and IP Rehab bed availability.  Note surgery was yesterday and I await post-op therapy notes.  Call if questions.   Carmelia Roller., CCC/SLP Admission Coordinator  [facility]  Cell [phone]

## 2017-05-14 NOTE — Consult Note (Addendum)
[place] Nurse ostomy consult note Surgical team following for assessment and plan of care to abd wound. Stoma type/location:  Colostomy surgery was performed yesterday. Stomal assessment/size:  Stoma to left abd is red and viable when assessed through the pouch, which is intact with a good seal on the first post-op day Peristomal assessment:  Intact skin surrounding Output: Small amt brown liquid stool  Ostomy pouching: 2pc.  Education provided: Will perform pouch change and educational session tomorrow.  Supplies ordered to the bedside for staff nurses use. Enrolled patient in [place] program: No Julien Girt MSN, [place], [place], [employer], [place]

## 2017-05-14 NOTE — Progress Notes (Signed)
Pt accidentally pulled NGT out. Denies any nausea. Refused to put NGT back in. Dr. Georgette Dover aware and said to leave NGT out for now. Will continue to monitor pt.

## 2017-05-14 NOTE — Progress Notes (Addendum)
[place] NOTE   Pharmacy Consult for TPN Indication: prolonged ileus   Patient Measurements: Height: 5\' 11"  (180.3 cm) Weight: 110 lb 14.3 oz (50.3 kg) IBW/kg (Calculated) : 75.3 TPN AdjBW (KG): 50.3 Body mass index is 15.47 kg/m.  Assessment: 51 yom admitted with diverticulitis with perforation and abscess s/p IR drain, transferred from [place] due to respiratory failure. Earlier hospital course complicated by DTs, acute respiratory failure with hypoxia, ARDS, aspiration pneumonia, and circulatory shock requiring intubation and pressors. Also ARF requiring dialysis, now improved and Renal signed off. 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 NGT came out overnight, patient does not want replaced. Pharmacy consulted to start TPN for expected prolonged ileus on 05/15/17. Poor PO intake noted for several weeks, RD documented "moderate malnutrition" in assessment 11/27.  PMH: COPD, heavy ETOH abuse Surgeries: 11/28 - sigmoid colectomy, end colostomy  GI: Keeping NGT out for now per patient request. 145 cc drain output in last 24hrs. Pre-albumin 19.1. LBM 11/28. PPI IV Endo: CBGs controlled on no meds Insulin requirements in the past 24 hours: none Lytes: all WNL, Ca 8.4 (no albumin available for coCa), no Phos available yet Renal: SCr back up a bit 1.29, CrCl~46, UOP 0.8 cc/kg/hr Pulm: successful bilateral thoracentesis 11/23 for pleural effusions Cards: BP soft, HR ST 100s Hepatobil: no recent LFTs Neuro: APAP, dilaudid PCA, robaxin ID: Zosyn for IAI. Cdiff negative. Afebrile, WBC down to 17.2  Best Practices: SQ heparin TPN Access: PICC placement pending TPN start date: 05/15/17 Nutritional Goals (per RD recommendation on 11/27): KCal: 2000-2200 Protein: 100-115 g Fluid: 2-2.2 L  Current Nutrition:  NPO  Plan:  Per discussion with Dr. Donne Hazel, TPN to start 11/30. Awaiting PICC  placement Monitor TPN labs, RD recommendations   Elicia Lamp, PharmD, BCPS Clinical Pharmacist Rx Phone # for today: [phone] After 3:30PM, please call Main Rx: [phone] 05/14/2017 1:36 PM

## 2017-05-14 NOTE — Progress Notes (Signed)
Patient ID: Dominic Phillips, male   DOB: 17-Jan-1961, 56 y.o.   MRN: [id number]  PROGRESS NOTE    Dominic Phillips  HYW:[id number] DOB: 1960/09/16 DOA: 04/11/2017 PCP: No primary care provider on file.   Brief Narrative:  56 year old male with past medical history of alcohol abuse presented to [facility] on 04/02/2017 with abdominal pain without fevers or leukocytosis. CT abdomen revealed pericolonic inflammation/fluid collection and he was admitted for diverticular abscess. General surgery was consulted who suggested placement of percutaneous drain by IR. Cultures grew Escherichia coli. Hospital course complicated by DTs, acute respiratory failure with hypoxia/ARDS/aspiration pneumonia and circulatory shock for which he required intubation and pressors; acute kidney injury for which she required CRRT and hemodialysis 1. Shock has resolved. Currently extubated and on room air. Renal function is improving. Nephrology signed off. Gen. surgery continues to follow.  Assessment & Plan:   Acute hypoxic respiratory failure/ARDS with bilateral pleural effusions - Multifactorial second to pulmonary edema versus pneumonia/atelectasis - resolved - Currently on room air. Pulmonary following. 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 Remains on IV Zosyn.  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 - no improvement with nonoperative management finally and that ended up with sigmoid colectomy and end ostomy on 11/28 by Dr. Donne Hazel -Remains nothing by mouth with IV fluids -per CCS, ? sTop antibiotics, deferre to  CCS  Hypokalemia -Improved  Acute kidney injury status post CRRT on 04/11/2017 and intermittent dialysis -due to sepsis, resolved -Renal function improved and stable. Nephrology has signed off -Follow renal function intermittently -HD catheter removed on 04/25/2017  Anemia of chronic disease - Status post 1 unit of packed red cells transfusion -Continue iron replacement -Monitor hemoglobin -No signs of acute bleeding  Thrombocytosis - probably reactive; monitor  Moderate malnutrition of chronic illness -Follow nutrition recommendations  DVT prophylaxis: Heparin Code Status:  Partial with no prolonged mechanical ventilation and intubation Family Communication: no family at bedside Disposition Plan: F SNF likely in 48 hours  Consultants:  General surgery IR PCCM   Procedures: IR drain in [facility] on 04/02/2017 CRRT Hemodialysis 1 Removal of HD dialysis catheter Intubation and extubation from 04/11/2017-04/17/17 Thoracentesis 2  Antimicrobials:  Anti-infectives (From admission, onward)   Start     Dose/Rate Route Frequency Ordered Stop   05/08/17 1000  fluconazole (DIFLUCAN) tablet 100 mg     100 mg Oral Daily 05/08/17 0603 05/12/17 0817   05/04/17 0130  piperacillin-tazobactam (ZOSYN) IVPB 3.375 g     3.375 g 12.5 mL/hr over 240 Minutes Intravenous Every 8 hours 05/03/17 2211     04/28/17 1530  vancomycin (VANCOCIN) IVPB 750 mg/150 ml premix  Status:  Discontinued     750 mg 150 mL/hr over 60 Minutes Intravenous Every 24 hours 04/28/17 1424 05/01/17 1128   04/27/17 1400  piperacillin-tazobactam (ZOSYN) IVPB 3.375 g  Status:  Discontinued     3.375 g 12.5 mL/hr over 240 Minutes Intravenous Every 8 hours 04/27/17 1158 05/03/17 2211   04/26/17 1830  vancomycin (VANCOCIN) 1,250 mg in sodium chloride 0.9 % 250 mL IVPB  Status:  Discontinued     1,250 mg 166.7 mL/hr over 90 Minutes Intravenous Every 48 hours 04/26/17  1739 04/28/17 1424   04/24/17 1700   vancomycin (VANCOCIN) IVPB 1000 mg/200 mL premix  Status:  Discontinued     1,000 mg 200 mL/hr over 60 Minutes Intravenous  Once 04/24/17 1652 04/24/17 1656   04/24/17 1700  vancomycin (VANCOCIN) IVPB 1000 mg/200 mL premix  Status:  Discontinued     1,000 mg 200 mL/hr over 60 Minutes Intravenous Every 48 hours 04/24/17 1656 04/26/17 1739   04/23/17 1500  piperacillin-tazobactam (ZOSYN) IVPB 3.375 g  Status:  Discontinued     3.375 g 12.5 mL/hr over 240 Minutes Intravenous Every 12 hours 04/23/17 1107 04/27/17 1158   04/18/17 1800  piperacillin-tazobactam (ZOSYN) IVPB 3.375 g  Status:  Discontinued     3.375 g 12.5 mL/hr over 240 Minutes Intravenous Every 12 hours 04/18/17 1032 04/23/17 1107   04/18/17 1400  piperacillin-tazobactam (ZOSYN) IVPB 3.375 g  Status:  Discontinued     3.375 g 12.5 mL/hr over 240 Minutes Intravenous Every 8 hours 04/18/17 1031 04/18/17 1032   04/17/17 1200  piperacillin-tazobactam (ZOSYN) IVPB 3.375 g  Status:  Discontinued     3.375 g 100 mL/hr over 30 Minutes Intravenous Every 6 hours 04/17/17 1030 04/18/17 1031   04/17/17 1100  piperacillin-tazobactam (ZOSYN) IVPB 3.375 g  Status:  Discontinued     3.375 g 12.5 mL/hr over 240 Minutes Intravenous Every 8 hours 04/17/17 1019 04/17/17 1030   04/15/17 2125  Ampicillin-Sulbactam (UNASYN) 3 g in sodium chloride 0.9 % 100 mL IVPB  Status:  Discontinued     3 g 200 mL/hr over 30 Minutes Intravenous Every 8 hours 04/15/17 1529 04/17/17 1019   04/15/17 1100  ampicillin-sulbactam (UNASYN) 1.5 g in sodium chloride 0.9 % 50 mL IVPB  Status:  Discontinued     1.5 g 100 mL/hr over 30 Minutes Intravenous Every 12 hours 04/15/17 0937 04/15/17 1529   04/13/17 2200  levofloxacin (LEVAQUIN) IVPB 500 mg  Status:  Discontinued     500 mg 100 mL/hr over 60 Minutes Intravenous Every 48 hours 04/12/17 0114 04/12/17 0846   04/13/17 1800  piperacillin-tazobactam (ZOSYN) IVPB 2.25 g  Status:  Discontinued     2.25 g 100 mL/hr over 30  Minutes Intravenous Every 8 hours 04/13/17 1050 04/15/17 0937   04/12/17 0200  piperacillin-tazobactam (ZOSYN) IVPB 3.375 g  Status:  Discontinued     3.375 g 12.5 mL/hr over 240 Minutes Intravenous Every 8 hours 04/12/17 0103 04/13/17 1050   04/12/17 0000  levofloxacin (LEVAQUIN) IVPB 750 mg  Status:  Discontinued     750 mg 100 mL/hr over 90 Minutes Intravenous Every 48 hours 04/11/17 2230 04/12/17 0114       Subjective: -feels ok, NG accidentally pulled out, no vomiting  Objective: Vitals:   05/14/17 0817 05/14/17 1129 05/14/17 1138 05/14/17 1531  BP:  (!) 96/59  113/64  Pulse:  (!) 107  (!) 107  Resp: 18 18 14 14   Temp:  98.5 F (36.9 C)  99.2 F (37.3 C)  TempSrc:  Oral  Oral  SpO2: 95% 96% 94% 92%  Weight:      Height:        Intake/Output Summary (Last 24 hours) at 05/14/2017 1555 Last data filed at 05/14/2017 1512 Gross per 24 hour  Intake 946.25 ml  Output 1550 ml  Net -603.75 ml   Filed Weights   05/13/17 0651 05/13/17 1034 05/13/17 1719  Weight: 50.7 kg (111 lb 12.4 oz) 50.3 kg (111 lb) 50.3 kg (110 lb 14.3 oz)  Examination:  General exam: Appears calm and comfortable, frail, thinly built male Respiratory system: Bilateral decreased breath sound at bases Cardiovascular system: S1 & S2 heard, rate controlled  Gastrointestinal system: Abdomen is nondistended, soft and nontender. Normal bowel sounds heard.  Abdominal drain present, colostomy with brown stool Extremities: No cyanosis, clubbing, edema   Data Reviewed: I have personally reviewed following labs and imaging studies  CBC: Recent Labs  Lab 05/12/17 0424 05/13/17 0412 05/13/17 2325 05/14/17 0522  WBC 10.6* 11.3* 19.0* 17.2*  NEUTROABS 6.7 7.1  --  14.1*  HGB 10.3* 10.1* 11.9* 10.4*  HCT 32.4* 31.9* 37.5* 33.6*  MCV 94.5 94.4 96.2 94.9  PLT 509* 524* 501* 500*   Basic Metabolic Panel: Recent Labs  Lab 05/09/17 0830 05/10/17 0428 05/11/17 0402 05/12/17 0424 05/13/17 0412  05/14/17 0522  NA  --  138 137 135 138 139  K  --  3.8 3.5 3.2* 3.5 3.9  CL  --  102 99* 97* 101 104  CO2  --  27 28 27 27 25   GLUCOSE  --  99 111* 98 95 110*  BUN  --  26* 26* 24* 25* 19  CREATININE  --  1.32* 1.20 1.26* 1.09 1.29*  CALCIUM  --  8.5* 8.7* 8.8* 8.9 8.4*  MG 1.8 2.3  --  1.7 1.8 1.7   GFR: Estimated Creatinine Clearance: 46.6 mL/min (A) (by C-G formula based on SCr of 1.29 mg/dL (H)). Liver Function Tests: Recent Labs  Lab 05/08/17 1539  PROT 6.3*   No results for input(s): LIPASE, AMYLASE in the last 168 hours. No results for input(s): AMMONIA in the last 168 hours. Coagulation Profile: No results for input(s): INR, PROTIME in the last 168 hours. Cardiac Enzymes: No results for input(s): CKTOTAL, CKMB, CKMBINDEX, TROPONINI in the last 168 hours. BNP (last 3 results) No results for input(s): PROBNP in the last 8760 hours. HbA1C: No results for input(s): HGBA1C in the last 72 hours. CBG: Recent Labs  Lab 05/08/17 0808 05/09/17 1211  GLUCAP 96 97   Lipid Profile: No results for input(s): CHOL, HDL, LDLCALC, TRIG, CHOLHDL, LDLDIRECT in the last 72 hours. Thyroid Function Tests: No results for input(s): TSH, T4TOTAL, FREET4, T3FREE, THYROIDAB in the last 72 hours. Anemia Panel: No results for input(s): VITAMINB12, FOLATE, FERRITIN, TIBC, IRON, RETICCTPCT in the last 72 hours. Sepsis Labs: No results for input(s): PROCALCITON, LATICACIDVEN in the last 168 hours.  Recent Results (from the past 240 hour(s))  Culture, body fluid-bottle     Status: None   Collection Time: 05/05/17  2:49 PM  Result Value Ref Range Status   Specimen Description FLUID PERITONEAL  Final   Special Requests BOTTLES DRAWN AEROBIC AND ANAEROBIC  Final   Culture NO GROWTH 5 DAYS  Final   Report Status 05/10/2017 FINAL  Final  Gram stain     Status: None   Collection Time: 05/05/17  2:49 PM  Result Value Ref Range Status   Specimen Description FLUID PERITONEAL  Final   Special  Requests NONE  Final   Gram Stain   Final    CYTOSPIN SMEAR WBC PRESENT,BOTH PMN AND MONONUCLEAR NO ORGANISMS SEEN    Report Status 05/05/2017 FINAL  Final  Urine Culture     Status: Abnormal   Collection Time: 05/06/17  9:18 AM  Result Value Ref Range Status   Specimen Description URINE, RANDOM  Final   Special Requests NONE  Final   Culture 20,000 COLONIES/mL YEAST (A)  Final  Report Status 05/07/2017 FINAL  Final  C difficile quick scan w PCR reflex     Status: None   Collection Time: 05/06/17  9:18 AM  Result Value Ref Range Status   C Diff antigen NEGATIVE NEGATIVE Final   C Diff toxin NEGATIVE NEGATIVE Final   C Diff interpretation No C. difficile detected.  Final  Gram stain     Status: None   Collection Time: 05/08/17  3:15 PM  Result Value Ref Range Status   Specimen Description PLEURAL  Final   Special Requests RIGHT  Final   Gram Stain   Final    WBC PRESENT, PREDOMINANTLY MONONUCLEAR NO ORGANISMS SEEN CYTOSPIN SMEAR    Report Status 05/09/2017 FINAL  Final  Culture, body fluid-bottle     Status: None   Collection Time: 05/08/17  3:15 PM  Result Value Ref Range Status   Specimen Description PLEURAL  Final   Special Requests RIGHT  Final   Culture NO GROWTH 5 DAYS  Final   Report Status 05/13/2017 FINAL  Final  Surgical pcr screen     Status: None   Collection Time: 05/13/17  4:41 AM  Result Value Ref Range Status   MRSA, PCR NEGATIVE NEGATIVE Final   Staphylococcus aureus NEGATIVE NEGATIVE Final    Comment: (NOTE) The Xpert SA Assay (FDA approved for NASAL specimens in patients 63 years of age and older), is one component of a comprehensive surveillance program. It is not intended to diagnose infection nor to guide or monitor treatment.          Radiology Studies: No results found.      Scheduled Meds: . heparin subcutaneous  5,000 Units Subcutaneous Q8H  . HYDROmorphone   Intravenous Q4H  . insulin aspart  0-9 Units Subcutaneous Q6H  .  mouth rinse  15 mL Mouth Rinse BID  . pantoprazole (PROTONIX) IV  40 mg Intravenous Q24H   Continuous Infusions: . sodium chloride 250 mL (05/14/17 1113)  . acetaminophen Stopped (05/14/17 0527)  . acetaminophen    . methocarbamol (ROBAXIN)  IV 500 mg (05/14/17 1455)  . piperacillin-tazobactam (ZOSYN)  IV Stopped (05/14/17 1317)     LOS: 32 days        Domenic Polite, MD [facility] Page via [email or web address] password [email or web address]  If 7PM-7AM, please contact night-coverage [email or web address] Password [email or web address] 05/14/2017, 3:55 PM

## 2017-05-14 NOTE — Progress Notes (Signed)
Nutrition Follow-up  DOCUMENTATION CODES:   Non-severe (moderate) malnutrition in context of chronic illness  INTERVENTION:   -TPN management per pharmacy -RD will follow for diet advancement and supplement as appropriate  NUTRITION DIAGNOSIS:   Moderate Malnutrition related to chronic illness(ETOH abuse) as evidenced by mild fat depletion, mild muscle depletion.  Ongoing  GOAL:   Patient will meet greater than or equal to 90% of their needs  Progressing  MONITOR:   Diet advancement, Labs, Weight trends, Skin, I & O's  REASON FOR ASSESSMENT:   Consult New TPN/TNA  ASSESSMENT:   56 year old male with PMH of ETOH abuse who was admitted to [facility] on 10/18 with diverticular abscess; hospital course complicated by DT's, development of a SBO, and PNA; required intubation on 10/25. He was transferred to [facility] on 10/27 with worsening renal failure, ARDS, aspiration PNA.  11/7- reviewed surgery note; plan to control leak and continue antibiotics; potential surgery in 6-8 weeks 11/8- s/p rt thoracentesis (1.3 L removed) 11/10- SLP evaluation- continue regular consistency diet with thin liquids, IJ cath removed 11/18- transferred from SDU to medical floor 11/20- s/p lt paracentesis, 600 ml fluid removed 11/21- per surgery note, plan to d/c condom cath and rectal tube, check c-diff 11/23 - bilateral thoracentesis  removal of 800 mL from left and 1100 mL from right 11/27 - cleared for surgery tomorrow for perf - hartmanns per Dr. Donne Hazel 11/28- s/p procedure: Sigmoid colectomy, end colostomy 11/29- NGT removed  Pt receiving nursing care at time of visit. Spoke with RN, who reports plan to start TPN. Pt is still awaiting PICC placement. Per pharmacy note, plan to initiate TPN tomorrow (05/15/17).   Reviewed wt hx; noted a 20# wt loss over the past week (15.4%).   Labs reviewed: Mg and K WDL. Inpatinet orders for glycemic control include 0-9 units insulin aspart every  6 hours.   Diet Order:  Diet NPO time specified Except for: Ice Chips  EDUCATION NEEDS:   Education needs have been addressed  Skin:  Skin Assessment: Skin Integrity Issues: Skin Integrity Issues:: Stage II, Incisions Stage I: sacrum, epithelialized Stage II: sacrum, epithelialized Incisions: closed abdomen  Last BM:  05/12/17  Height:   Ht Readings from Last 1 Encounters:  05/13/17 5\' 11"  (1.803 m)    Weight:   Wt Readings from Last 1 Encounters:  05/13/17 110 lb 14.3 oz (50.3 kg)    Ideal Body Weight:  78.2 kg  BMI:  Body mass index is 15.47 kg/m.  Estimated Nutritional Needs:   Kcal:  2000-2200  Protein:  100-115 grams  Fluid:  2-2.2 L    Dominic Phillips A. Jimmye Norman, RD, LDN, CDE Pager: [phone] After hours Pager: [phone]

## 2017-05-14 NOTE — Progress Notes (Signed)
Peripherally Inserted Central Catheter/Midline Placement  The IV Nurse has discussed with the patient and/or persons authorized to consent for the patient, the purpose of this procedure and the potential benefits and risks involved with this procedure.  The benefits include less needle sticks, lab draws from the catheter, and the patient may be discharged home with the catheter. Risks include, but not limited to, infection, bleeding, blood clot (thrombus formation), and puncture of an artery; nerve damage and irregular heartbeat and possibility to perform a PICC exchange if needed/ordered by physician.  Alternatives to this procedure were also discussed.  Bard Power PICC patient education guide, fact sheet on infection prevention and patient information card has been provided to patient /or left at bedside.    PICC/Midline Placement Documentation  PICC Double Lumen 05/14/17 PICC Right Brachial 37 cm 0 cm (Active)  Indication for Insertion or Continuance of Line Administration of hyperosmolar/irritating solutions (i.e. TPN, Vancomycin, etc.) 05/14/2017  7:20 PM  Exposed Catheter (cm) 0 cm 05/14/2017  7:20 PM  Site Assessment Clean;Dry;Intact 05/14/2017  7:20 PM  Lumen #1 Status Flushed;Saline locked;Blood return noted 05/14/2017  7:20 PM  Lumen #2 Status Flushed;Saline locked;Blood return noted 05/14/2017  7:20 PM  Dressing Type Transparent;Securing device 05/14/2017  7:20 PM  Dressing Status Clean;Dry;Intact;Antimicrobial disc in place 05/14/2017  7:20 PM  Dressing Change Due 05/21/17 05/14/2017  7:20 PM       Frances Maywood 05/14/2017, 7:41 PM

## 2017-05-15 LAB — CBC
HCT: 28 % — ABNORMAL LOW (ref 39.0–52.0)
Hemoglobin: 8.7 g/dL — ABNORMAL LOW (ref 13.0–17.0)
MCH: 30 pg (ref 26.0–34.0)
MCHC: 31.1 g/dL (ref 30.0–36.0)
MCV: 96.6 fL (ref 78.0–100.0)
PLATELETS: 494 10*3/uL — AB (ref 150–400)
RBC: 2.9 MIL/uL — AB (ref 4.22–5.81)
RDW: 16.5 % — ABNORMAL HIGH (ref 11.5–15.5)
WBC: 13.9 10*3/uL — AB (ref 4.0–10.5)

## 2017-05-15 LAB — GLUCOSE, CAPILLARY
GLUCOSE-CAPILLARY: 91 mg/dL (ref 65–99)
Glucose-Capillary: 110 mg/dL — ABNORMAL HIGH (ref 65–99)
Glucose-Capillary: 81 mg/dL (ref 65–99)
Glucose-Capillary: 87 mg/dL (ref 65–99)

## 2017-05-15 LAB — COMPREHENSIVE METABOLIC PANEL
ALT: 9 U/L — ABNORMAL LOW (ref 17–63)
ANION GAP: 8 (ref 5–15)
AST: 14 U/L — ABNORMAL LOW (ref 15–41)
Albumin: 2 g/dL — ABNORMAL LOW (ref 3.5–5.0)
Alkaline Phosphatase: 71 U/L (ref 38–126)
BUN: 14 mg/dL (ref 6–20)
CHLORIDE: 107 mmol/L (ref 101–111)
CO2: 24 mmol/L (ref 22–32)
Calcium: 8.4 mg/dL — ABNORMAL LOW (ref 8.9–10.3)
Creatinine, Ser: 1.14 mg/dL (ref 0.61–1.24)
GFR calc Af Amer: >60 mL/min (ref >60)
Glucose, Bld: 86 mg/dL (ref 65–99)
POTASSIUM: 3.6 mmol/L (ref 3.5–5.1)
Sodium: 139 mmol/L (ref 135–145)
Total Bilirubin: 0.3 mg/dL (ref 0.3–1.2)
Total Protein: 6.1 g/dL — ABNORMAL LOW (ref 6.5–8.1)

## 2017-05-15 LAB — PREALBUMIN: PREALBUMIN: 14.3 mg/dL — AB (ref 18–38)

## 2017-05-15 LAB — DIFFERENTIAL
Basophils Absolute: 0.1 10*3/uL (ref 0.0–0.1)
Basophils Relative: 1 %
EOS PCT: 2 %
Eosinophils Absolute: 0.3 10*3/uL (ref 0.0–0.7)
LYMPHS PCT: 13 %
Lymphs Abs: 1.7 10*3/uL (ref 0.7–4.0)
Monocytes Absolute: 1.1 10*3/uL — ABNORMAL HIGH (ref 0.1–1.0)
Monocytes Relative: 8 %
NEUTROS ABS: 10.7 10*3/uL — AB (ref 1.7–7.7)
NEUTROS PCT: 76 %

## 2017-05-15 LAB — TRIGLYCERIDES: TRIGLYCERIDES: 155 mg/dL — AB (ref <150)

## 2017-05-15 LAB — MAGNESIUM: MAGNESIUM: 1.7 mg/dL (ref 1.7–2.4)

## 2017-05-15 LAB — PHOSPHORUS: PHOSPHORUS: 4.8 mg/dL — AB (ref 2.5–4.6)

## 2017-05-15 MED ORDER — HYDROMORPHONE HCL 1 MG/ML IJ SOLN
1.0000 mg | INTRAMUSCULAR | Status: DC | PRN
  Administered 2017-05-15 – 2017-05-18 (×6): 1 mg via INTRAVENOUS
  Filled 2017-05-15 (×7): qty 1

## 2017-05-15 MED ORDER — HYDROMORPHONE HCL 1 MG/ML PO LIQD: 1.0000 mg | ORAL | Status: DC | PRN

## 2017-05-15 MED ORDER — MAGNESIUM SULFATE IN D5W 1-5 GM/100ML-% IV SOLN
1.0000 g | Freq: Once | INTRAVENOUS | Status: AC
  Administered 2017-05-15: 1 g via INTRAVENOUS
  Filled 2017-05-15: qty 100

## 2017-05-15 MED ORDER — POTASSIUM CHLORIDE 10 MEQ/50ML IV SOLN
10.0000 meq | INTRAVENOUS | Status: AC
  Administered 2017-05-15 (×3): 10 meq via INTRAVENOUS
  Filled 2017-05-15 (×3): qty 50

## 2017-05-15 MED ORDER — HYDROMORPHONE HCL 1 MG/ML IJ SOLN: 0.5000 mg | INTRAMUSCULAR | Status: DC | PRN

## 2017-05-15 MED ORDER — TRAVASOL 10 % IV SOLN
INTRAVENOUS | Status: AC
  Administered 2017-05-15: 18:00:00 via INTRAVENOUS
  Filled 2017-05-15: qty 487.2

## 2017-05-15 MED ORDER — METHOCARBAMOL 1000 MG/10ML IJ SOLN
1000.0000 mg | Freq: Three times a day (TID) | INTRAMUSCULAR | Status: DC
  Administered 2017-05-15 – 2017-05-18 (×9): 1000 mg via INTRAVENOUS
  Filled 2017-05-15 (×11): qty 10

## 2017-05-15 NOTE — Progress Notes (Signed)
Inpatient Rehabilitation  Continuing to follow along for timing of medical readiness, therapy tolerance, patient's choice, and IP Rehab bed availability.  Call if questions.  Carmelia Roller., CCC/SLP Admission Coordinator  [place]  Cell [phone]

## 2017-05-15 NOTE — Progress Notes (Signed)
PHARMACY - ADULT TOTAL PARENTERAL NUTRITION CONSULT NOTE   Pharmacy Consult:  TPN Indication:  Prolonged ileus / Colocutaneous fistula  Patient Measurements: Height: 5\' 11"  (180.3 cm) Weight: 115 lb 15.4 oz (52.6 kg) IBW/kg (Calculated) : 75.3 TPN AdjBW (KG): 50.3 Body mass index is 16.17 kg/m.  Assessment:  77 YOM admitted with diverticulitis with perforation and abscess s/p IR drain, transferred from [place] due to respiratory failure. Earlier hospital course complicated by DTs, acute respiratory failure with hypoxia, ARDS, aspiration pneumonia, and circulatory shock requiring intubation and pressors. Also ARF requiring dialysis, now improved and Renal signed off. 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 NGT came out overnight, patient does not want it replaced. Pharmacy consulted to manage TPN for expected prolonged ileus on 05/15/17. Poor PO intake noted for several weeks, RD documented "moderate malnutrition" in assessment 05/12/17.  GI: no NG tube per patient request.  Prealbumin 19.1 > 14.3, drain O/P 6mL, LBM 11/28 - PPI IV Endo: CBGs low normal Insulin requirements in the past 24 hours: 0 unit Lytes: K+ 3.6 (goal >/= 4 for ileus), Phos elevated at 4.8, Mag 1.7 (goal >/= 2 for ileus) Renal: SCr down 1.14, BUN WNL - UOP 1 ml/kg/hr Pulm: COPD - successful bilateral thoracentesis 11/23 for pleural effusions.  Remains on 2L [place] Cards: BP soft, ST Hepatobil: LFTs WNL.  TG mildly elevated 155 Neuro: EtOH use - APAP, Dilaudid PCA, Robaxin ID: [email or web address] for IAI. Cdiff negative. Afebrile, WBC down to 17.2 Best Practices: SQ heparin TPN Access: PICC placed 05/14/17 TPN start date: 05/15/17  Nutritional Goals (per RD recommendation on 11/27): 2000-2200 kCal and 100-115 gm protein per day Fluid: 2-2.2 L  Current Nutrition:  NPO   Plan:  - Initiate TPN at 35 ml/hr d/t possibility of refeeding.  Goal TPN rate ~80 ml/hr. - Today's TPN will  provide 48 gm AA, 151 gm CHO, and 18 gm lipid (reduced d/t mildly elevated TG), which equals to 885 kCal.  TPN meeting ~44% of patient's needs. - Electrolytes in TPN: increase Mag, reduce Phos, K 50 mEq/L = 42 mEq/d, CL:AC 1:1 - Daily multivitamin and trace elements in TPN - Add thiamine and folic acid to TPN d/t heavy EtOH use - Continue sensitive SSI Q6H.  D/C if CBGs remain controlled at goal TPN rate. - Mag sulfate 1gm IV x 1 - KCL x 3 runs - F/U AM labs   Dominic Phillips D. [name], PharmD, BCPS Pager:  [phone] 05/15/2017, 10:16 AM

## 2017-05-15 NOTE — Care Management Note (Addendum)
Case Management Note  Patient Details  Name: Dominic Phillips MRN: [id number] Date of Birth: 26-Apr-1961  Subjective/Objective:                    Action/Plan: [place]   O  [phone]  Ext. [phone] F  [phone]  [email or web address]  Sent paperwork, same given to patient  Confirmed face sheet information with patient. Patient does live alone , however his daughter and friend plan to stay with him at discharge. Await post op PT eval . Patient does want to go to [place].   See previous note . Patient states he never received charity paperwork from [facility] to complete. [facility] again left another message with [facility] [phone] ext [phone].   Patient does not have PCP , provided [facility] information . Explained [facility] requires patient to call to establish care. Patient states he will call today.  Expected Discharge Date:                  Expected Discharge Plan:     In-House Referral:  Clinical Social Work  Discharge planning Services  CM Consult  Post Acute Care Choice:    Choice offered to:  Patient  DME Arranged:    DME Agency:     HH Arranged:    [place] Agency:     Status of Service:  In process, will continue to follow  If discussed at Long Length of Stay Meetings, dates discussed:    Additional Comments:  Marilu Favre, RN 05/15/2017, 11:16 AM

## 2017-05-15 NOTE — Progress Notes (Signed)
Physical Therapy Treatment/ Re-evaluation Patient Details Name: Dominic Phillips MRN: [id number] DOB: 08-05-1960 Today's Date: 05/15/2017    History of Present Illness Pt admitted to [facility] and transferred on 04/02/17 with c/o abdominal pain.  Pt presented with ETOH withdrawls, liver abscess and SBO on TPN.  10/23 presents with hypoxia and increased WBC count, 10/25 intubated, 10/26 scans show PNA and fluid overload, patient extubated on 04/17/17.  Chart review shows COPD, VDRF, anemia, AKI and hypokalemia in addition to previous listed statements.  PMH: ETOH abuse.  11/8 R thoracentesis.  05/13/17 pt underwent sigmoid colectomy with end colostomy.    PT Comments    Pt underwent colectomy with colostomy 05/13/17.  Pt reporting 10/10 pain but agreeable to participation in PT session. Min guard assist required for transfers and ambulation 150 feet with RW. Pt demonstrates balance deficits and decreased safety awareness. Discharge recommendations remain appropriate. Pt would benefit from CIR prior to d/c home. He lives alone but will have family assist when ready to return home. Goals reviewed and remain appropriate.    Follow Up Recommendations  CIR     Equipment Recommendations  Rolling walker with 5" wheels;3in1 (PT)    Recommendations for Other Services       Precautions / Restrictions Precautions Precautions: Fall;Other (comment) Precaution Comments: drains, colostomy bag Restrictions Weight Bearing Restrictions: No    Mobility  Bed Mobility Overal bed mobility: Needs Assistance         Sit to supine: Supervision   General bed mobility comments: supervision for safety, +rail  Transfers Overall transfer level: Needs assistance Equipment used: Rolling walker (2 wheeled) Transfers: Sit to/from Stand Sit to Stand: Min guard         General transfer comment: min guard for safety, increased time and effort to power up, increased time to stabilize initial standing  balance  Ambulation/Gait Ambulation/Gait assistance: Min guard Ambulation Distance (Feet): 150 Feet Assistive device: Rolling walker (2 wheeled) Gait Pattern/deviations: Step-through pattern;Decreased stride length;Trunk flexed Gait velocity: decreased Gait velocity interpretation: Below normal speed for age/gender General Gait Details: verbal cues for posture, distance limited by pain   Stairs            Wheelchair Mobility    Modified Rankin (Stroke Patients Only)       Balance   Sitting-balance support: No upper extremity supported;Feet supported Sitting balance-Leahy Scale: Good     Standing balance support: During functional activity;Bilateral upper extremity supported Standing balance-Leahy Scale: Fair Standing balance comment: RW needed for ambulation                            Cognition Arousal/Alertness: Awake/alert Behavior During Therapy: Flat affect Overall Cognitive Status: Within Functional Limits for tasks assessed                                        Exercises      General Comments        Pertinent Vitals/Pain Pain Assessment: 0-10 Pain Score: 10-Worst pain ever Pain Location: abdomen Pain Descriptors / Indicators: Spasm;Aching;Guarding;Grimacing Pain Intervention(s): Monitored during session;Repositioned    Home Living                      Prior Function            PT Goals (current goals can now be found in  the care plan section) Acute Rehab PT Goals Patient Stated Goal: wants to get back home and back to independence to care for his son PT Goal Formulation: With patient Time For Goal Achievement: 05/29/17 Potential to Achieve Goals: Good Progress towards PT goals: Progressing toward goals    Frequency    Min 3X/week      PT Plan Current plan remains appropriate    Co-evaluation              AM-PAC PT "6 Clicks" Daily Activity  Outcome Measure  Difficulty turning over  in bed (including adjusting bedclothes, sheets and blankets)?: A Little Difficulty moving from lying on back to sitting on the side of the bed? : A Little Difficulty sitting down on and standing up from a chair with arms (e.g., wheelchair, bedside commode, etc,.)?: A Little Help needed moving to and from a bed to chair (including a wheelchair)?: A Little Help needed walking in hospital room?: A Little Help needed climbing 3-5 steps with a railing? : A Lot 6 Click Score: 17    End of Session Equipment Utilized During Treatment: Gait belt Activity Tolerance: Patient tolerated treatment well Patient left: in bed;with call bell/phone within reach;with bed alarm set Nurse Communication: Mobility status PT Visit Diagnosis: Unsteadiness on feet (R26.81);Muscle weakness (generalized) (M62.81);Other abnormalities of gait and mobility (R26.89)     Time: 6948-5462 PT Time Calculation (min) (ACUTE ONLY): 23 min  Charges:  $Gait Training: 23-37 mins                    G Codes:       Lorrin Goodell, PT  Office # [phone] Pager [phone]    Lorriane Shire 05/15/2017, 12:17 PM

## 2017-05-15 NOTE — Progress Notes (Signed)
[facility] Progress Note  2 Days Post-Op  Subjective: CC:  Abdominal pain rated 10/10. Unable to get out of bed yesterday or work with PT 2/2 pain. Not using PCA button regularly. Unsure if having flatus/stool in colostomy pouch. Denies nausea or vomiting. Endorses intermitted hiccups.   Objective: Vital signs in last 24 hours: Temp:  [98 F (36.7 C)-99.2 F (37.3 C)] 98.2 F (36.8 C) (11/30 0458) Pulse Rate:  [94-107] 94 (11/30 0458) Resp:  [13-18] 18 (11/30 0458) BP: (96-122)/(59-67) 116/65 (11/30 0458) SpO2:  [90 %-100 %] 100 % (11/30 0458) Weight:  [52.6 kg (115 lb 15.4 oz)] 52.6 kg (115 lb 15.4 oz) (11/30 0255) Last BM Date: 05/13/17  Intake/Output from previous day: 11/29 0701 - 11/30 0700 In: 2052.5 [I.V.:1747.5; IV Piggyback:305] Out: 1285 [Urine:1200; Drains:85] Intake/Output this shift: No intake/output data recorded.  PE: Gen:  Alert, NAD, cooperative Card:  Regular rate and rhythm, pedal pulses 2+ BL Pulm:  Normal effort, clear to auscultation bilaterally with diminished breath sounds in lung bases Abd: Soft, appropriately tender, midline incision granulating nicely - no drainage or surrounding erythema, LLQ stoma viable with small amount SS drainage, tiny amt liquid stool, no gas.    JP drain 1: 45 cc/24h sanguinous (superior drain) JP drain 2: 40 cc/24h sanguinous (inferior drain)  Skin: warm and dry, no rashes  Psych: A&Ox3     Lab Results:  Recent Labs    05/14/17 0522 05/15/17 0354  WBC 17.2* 13.9*  HGB 10.4* 8.7*  HCT 33.6* 28.0*  PLT 494* 494*   BMET Recent Labs    05/14/17 0522 05/15/17 0354  NA 139 139  K 3.9 3.6  CL 104 107  CO2 25 24  GLUCOSE 110* 86  BUN 19 14  CREATININE 1.29* 1.14  CALCIUM 8.4* 8.4*   PT/INR No results for input(s): LABPROT, INR in the last 72 hours. CMP     Component Value Date/Time   NA 139 05/15/2017 0354   K 3.6 05/15/2017 0354   CL 107 05/15/2017 0354   CO2 24 05/15/2017 0354   GLUCOSE 86 05/15/2017 0354   BUN 14 05/15/2017 0354   CREATININE 1.14 05/15/2017 0354   CALCIUM 8.4 (L) 05/15/2017 0354   PROT 6.1 (L) 05/15/2017 0354   ALBUMIN 2.0 (L) 05/15/2017 0354   AST 14 (L) 05/15/2017 0354   ALT 9 (L) 05/15/2017 0354   ALKPHOS 71 05/15/2017 0354   BILITOT 0.3 05/15/2017 0354   GFRNONAA >60 05/15/2017 0354   GFRAA >60 05/15/2017 0354   Lipase  No results found for: LIPASE     Studies/Results: No results found.  Anti-infectives: Anti-infectives (From admission, onward)   Start     Dose/Rate Route Frequency Ordered Stop   05/08/17 1000  fluconazole (DIFLUCAN) tablet 100 mg     100 mg Oral Daily 05/08/17 0603 05/12/17 0817   05/04/17 0130  piperacillin-tazobactam (ZOSYN) IVPB 3.375 g     3.375 g 12.5 mL/hr over 240 Minutes Intravenous Every 8 hours 05/03/17 2211     04/28/17 1530  vancomycin (VANCOCIN) IVPB 750 mg/150 ml premix  Status:  Discontinued     750 mg 150 mL/hr over 60 Minutes Intravenous Every 24 hours 04/28/17 1424 05/01/17 1128   04/27/17 1400  piperacillin-tazobactam (ZOSYN) IVPB 3.375 g  Status:  Discontinued     3.375 g 12.5 mL/hr over 240 Minutes Intravenous Every 8 hours 04/27/17 1158 05/03/17 2211   04/26/17 1830  vancomycin (VANCOCIN) 1,250 mg in sodium chloride  0.9 % 250 mL IVPB  Status:  Discontinued     1,250 mg 166.7 mL/hr over 90 Minutes Intravenous Every 48 hours 04/26/17 1739 04/28/17 1424   04/24/17 1700  vancomycin (VANCOCIN) IVPB 1000 mg/200 mL premix  Status:  Discontinued     1,000 mg 200 mL/hr over 60 Minutes Intravenous  Once 04/24/17 1652 04/24/17 1656   04/24/17 1700  vancomycin (VANCOCIN) IVPB 1000 mg/200 mL premix  Status:  Discontinued     1,000 mg 200 mL/hr over 60 Minutes Intravenous Every 48 hours 04/24/17 1656 04/26/17 1739   04/23/17 1500  piperacillin-tazobactam (ZOSYN) IVPB 3.375 g  Status:  Discontinued     3.375 g 12.5 mL/hr over 240 Minutes Intravenous Every 12 hours 04/23/17 1107 04/27/17 1158    04/18/17 1800  piperacillin-tazobactam (ZOSYN) IVPB 3.375 g  Status:  Discontinued     3.375 g 12.5 mL/hr over 240 Minutes Intravenous Every 12 hours 04/18/17 1032 04/23/17 1107   04/18/17 1400  piperacillin-tazobactam (ZOSYN) IVPB 3.375 g  Status:  Discontinued     3.375 g 12.5 mL/hr over 240 Minutes Intravenous Every 8 hours 04/18/17 1031 04/18/17 1032   04/17/17 1200  piperacillin-tazobactam (ZOSYN) IVPB 3.375 g  Status:  Discontinued     3.375 g 100 mL/hr over 30 Minutes Intravenous Every 6 hours 04/17/17 1030 04/18/17 1031   04/17/17 1100  piperacillin-tazobactam (ZOSYN) IVPB 3.375 g  Status:  Discontinued     3.375 g 12.5 mL/hr over 240 Minutes Intravenous Every 8 hours 04/17/17 1019 04/17/17 1030   04/15/17 2125  Ampicillin-Sulbactam (UNASYN) 3 g in sodium chloride 0.9 % 100 mL IVPB  Status:  Discontinued     3 g 200 mL/hr over 30 Minutes Intravenous Every 8 hours 04/15/17 1529 04/17/17 1019   04/15/17 1100  ampicillin-sulbactam (UNASYN) 1.5 g in sodium chloride 0.9 % 50 mL IVPB  Status:  Discontinued     1.5 g 100 mL/hr over 30 Minutes Intravenous Every 12 hours 04/15/17 0937 04/15/17 1529   04/13/17 2200  levofloxacin (LEVAQUIN) IVPB 500 mg  Status:  Discontinued     500 mg 100 mL/hr over 60 Minutes Intravenous Every 48 hours 04/12/17 0114 04/12/17 0846   04/13/17 1800  piperacillin-tazobactam (ZOSYN) IVPB 2.25 g  Status:  Discontinued     2.25 g 100 mL/hr over 30 Minutes Intravenous Every 8 hours 04/13/17 1050 04/15/17 0937   04/12/17 0200  piperacillin-tazobactam (ZOSYN) IVPB 3.375 g  Status:  Discontinued     3.375 g 12.5 mL/hr over 240 Minutes Intravenous Every 8 hours 04/12/17 0103 04/13/17 1050   04/12/17 0000  levofloxacin (LEVAQUIN) IVPB 750 mg  Status:  Discontinued     750 mg 100 mL/hr over 90 Minutes Intravenous Every 48 hours 04/11/17 2230 04/12/17 0114     Assessment/Plan Acute divert[facility]itis with perforation. [facility]  [facility] - feculent drainage -Transferred due to respiratory failure10/27/2018. -Earlier hospital course, complicated by DTs, acute respiratory failure with hypoxia, ARDS, aspiration pneumonia and circulatory shock which required intubation and pressors. Also ARF requiring dialysis.Lungs are improving.  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 POD#2 S/P Sigmoid colectomy, end colostomy 05/13/17 Dr. Rolm Bookbinder  - afebrile, VSS, WBC 13.9 from 17 - await bowel function; appreciate WOC education  - BID wet to dry to midline incision.  - pain control: IV tylenol, increase robaxin to 1,000 mg q 8h, D/C PCA and start PRN IV dilaudid; for persistent issues with pain control will consider toradol.  -  OOB/mobilize and use IS  - PT/OT   pleural effusions -Successful bilateral thoracentesis11/23; CCM recs repeat CXR11/26 showed improved b/l lung base ventilation, small if any residual pleural effusions Loculated left-sided ascites.600 mL aspirated. Cultures negative. Hx of tobacco use Hx of alcohol abuse Anemia - hgb 10.4, hct 33.6, follow  Chronic kidney disease   FEN:NPO, IVF; TNA 11/29 >>  JA:SNKN stopped 11/16, IV zosyn (11/2=>>day28).Diflucan 11/23 for possible yeast UTI.C. difficile negative. Plan to continue IV abx for a total of 7 days post-op DVT: SQ heparin Foley: D/C today 11/29  Follow up: Dr. Rolm Bookbinder      LOS: 43 days    Dominic Phillips , [facility] 05/15/2017, 7:56 AM Pager: [phone] Consults: [phone] Mon-Fri 7:00 am-4:30 pm Sat-Sun 7:00 am-11:30 am

## 2017-05-15 NOTE — Consult Note (Addendum)
[facility] Nurse ostomy follow up Surgical team following for assessment and plan of care for abd wound Stoma type/location: Colostomy surgery performed on 11/28. Stoma red and viable, above skin level, 1 3/8 inches Peristomal assessment: Intact skin surrounding Output:Small amt brown liquid stool in the pouch  Ostomy pouching: 1pc. Education provided:  Demonstrated change to patient using one piece pouch.  He was able to open and close velcro to empty.  Discussed pouching routines and ordering supplies. Pt asked appropriate questions.  Extra supplies ordered to the bedside and educational materials in the room.  He denies need for any family members to attend a teaching session.  Pt could benefit from home health after discharge. Enrolled patient in [place] Start Discharge program: Yes Julien Girt MSN, RN, [place], [place], [place]

## 2017-05-15 NOTE — Progress Notes (Signed)
Patient ID: Dominic Phillips, male   DOB: Aug 14, 1960, 56 y.o.   MRN: [id number]  PROGRESS NOTE    Dominic Phillips  XHB:[id number] DOB: Oct 01, 1960 DOA: 04/11/2017 PCP: No primary care provider on file.   Brief Narrative:  56 year old male with past medical history of alcohol abuse presented to [facility] on 04/02/2017 with abdominal pain without fevers or leukocytosis. CT abdomen revealed pericolonic inflammation/fluid collection and he was admitted for diverticular abscess. General surgery was consulted who suggested placement of percutaneous drain by IR. Cultures grew Escherichia coli. Hospital course complicated by DTs, acute respiratory failure with hypoxia/ARDS/aspiration pneumonia and circulatory shock for which he required intubation and pressors; acute kidney injury for which she required CRRT and hemodialysis 1. Shock has resolved. Currently extubated and on room air. Renal function is improving. Nephrology signed off. Then underwent sigmoid colectomy and end ostomy on 11/28 by Dr. Donne Hazel Now with post op ileus , TNA started 11/30  Assessment & Plan:   Acute hypoxic respiratory failure/ARDS with bilateral pleural effusions - Multifactorial second to pulmonary edema versus pneumonia/atelectasis - resolved - Currently on room air. Pulmonary following. 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 Remains on IV Zosyn.  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 - no improvement with nonoperative management finally and that ended up with sigmoid colectomy and end ostomy on 11/28 by Dr. Donne Hazel -Now with post  op ileus, no return of bowel function yet, starting TNA today -Per Dr.Wakefield continue Abx 7days port op, POD 2 now  Hypokalemia -Improved  Acute kidney injury status post CRRT on 04/11/2017 and intermittent dialysis -due to sepsis, resolved -Renal function improved and stable. Nephrology has signed off -Follow renal function intermittently -HD catheter removed on 04/25/2017  Anemia of chronic disease - Status post 1 unit of packed red cells transfusion -Continue iron replacement -Monitor hemoglobin -No signs of acute bleeding  Thrombocytosis - probably reactive; monitor  Moderate malnutrition of chronic illness -Follow nutrition recommendations  DVT prophylaxis: Heparin Code Status:  Partial with no prolonged mechanical ventilation and intubation Family Communication: no family at bedside Disposition Plan: CIR vs  SNF when improved  Consultants:  General surgery IR PCCM   Procedures: IR drain in [facility] on 04/02/2017 CRRT Hemodialysis 1 Removal of HD dialysis catheter Intubation and extubation from 04/11/2017-04/17/17 Thoracentesis 2  Antimicrobials:  Anti-infectives (From admission, onward)   Start     Dose/Rate Route Frequency Ordered Stop   05/08/17 1000  fluconazole (DIFLUCAN) tablet 100 mg     100 mg Oral Daily 05/08/17 0603 05/12/17 0817   05/04/17 0130  piperacillin-tazobactam (ZOSYN) IVPB 3.375 g     3.375 g 12.5 mL/hr over 240 Minutes Intravenous Every 8 hours 05/03/17 2211     04/28/17 1530  vancomycin (VANCOCIN) IVPB 750 mg/150 ml premix  Status:  Discontinued     750 mg 150 mL/hr over 60 Minutes Intravenous Every 24 hours 04/28/17 1424 05/01/17 1128   04/27/17 1400  piperacillin-tazobactam (ZOSYN) IVPB 3.375 g  Status:  Discontinued     3.375 g 12.5 mL/hr over 240 Minutes Intravenous Every 8 hours 04/27/17 1158 05/03/17 2211   04/26/17 1830  vancomycin (VANCOCIN) 1,250 mg in sodium chloride  0.9 % 250 mL IVPB  Status:  Discontinued      1,250 mg 166.7 mL/hr over 90 Minutes Intravenous Every 48 hours 04/26/17 1739 04/28/17 1424   04/24/17 1700  vancomycin (VANCOCIN) IVPB 1000 mg/200 mL premix  Status:  Discontinued     1,000 mg 200 mL/hr over 60 Minutes Intravenous  Once 04/24/17 1652 04/24/17 1656   04/24/17 1700  vancomycin (VANCOCIN) IVPB 1000 mg/200 mL premix  Status:  Discontinued     1,000 mg 200 mL/hr over 60 Minutes Intravenous Every 48 hours 04/24/17 1656 04/26/17 1739   04/23/17 1500  piperacillin-tazobactam (ZOSYN) IVPB 3.375 g  Status:  Discontinued     3.375 g 12.5 mL/hr over 240 Minutes Intravenous Every 12 hours 04/23/17 1107 04/27/17 1158   04/18/17 1800  piperacillin-tazobactam (ZOSYN) IVPB 3.375 g  Status:  Discontinued     3.375 g 12.5 mL/hr over 240 Minutes Intravenous Every 12 hours 04/18/17 1032 04/23/17 1107   04/18/17 1400  piperacillin-tazobactam (ZOSYN) IVPB 3.375 g  Status:  Discontinued     3.375 g 12.5 mL/hr over 240 Minutes Intravenous Every 8 hours 04/18/17 1031 04/18/17 1032   04/17/17 1200  piperacillin-tazobactam (ZOSYN) IVPB 3.375 g  Status:  Discontinued     3.375 g 100 mL/hr over 30 Minutes Intravenous Every 6 hours 04/17/17 1030 04/18/17 1031   04/17/17 1100  piperacillin-tazobactam (ZOSYN) IVPB 3.375 g  Status:  Discontinued     3.375 g 12.5 mL/hr over 240 Minutes Intravenous Every 8 hours 04/17/17 1019 04/17/17 1030   04/15/17 2125  Ampicillin-Sulbactam (UNASYN) 3 g in sodium chloride 0.9 % 100 mL IVPB  Status:  Discontinued     3 g 200 mL/hr over 30 Minutes Intravenous Every 8 hours 04/15/17 1529 04/17/17 1019   04/15/17 1100  ampicillin-sulbactam (UNASYN) 1.5 g in sodium chloride 0.9 % 50 mL IVPB  Status:  Discontinued     1.5 g 100 mL/hr over 30 Minutes Intravenous Every 12 hours 04/15/17 0937 04/15/17 1529   04/13/17 2200  levofloxacin (LEVAQUIN) IVPB 500 mg  Status:  Discontinued     500 mg 100 mL/hr over 60 Minutes Intravenous Every 48 hours 04/12/17 0114 04/12/17 0846    04/13/17 1800  piperacillin-tazobactam (ZOSYN) IVPB 2.25 g  Status:  Discontinued     2.25 g 100 mL/hr over 30 Minutes Intravenous Every 8 hours 04/13/17 1050 04/15/17 0937   04/12/17 0200  piperacillin-tazobactam (ZOSYN) IVPB 3.375 g  Status:  Discontinued     3.375 g 12.5 mL/hr over 240 Minutes Intravenous Every 8 hours 04/12/17 0103 04/13/17 1050   04/12/17 0000  levofloxacin (LEVAQUIN) IVPB 750 mg  Status:  Discontinued     750 mg 100 mL/hr over 90 Minutes Intravenous Every 48 hours 04/11/17 2230 04/12/17 0114       Subjective: -having abd pain, no vomiting  Objective: Vitals:   05/15/17 0255 05/15/17 0424 05/15/17 0458 05/15/17 1426  BP: 122/65  116/65 125/65  Pulse: 97  94 84  Resp: 16 18 18 18   Temp: 98.1 F (36.7 C)  98.2 F (36.8 C) 98 F (36.7 C)  TempSrc: Oral  Oral Oral  SpO2: 100% 100% 100% 99%  Weight: 52.6 kg (115 lb 15.4 oz)     Height:        Intake/Output Summary (Last 24 hours) at 05/15/2017 1454 Last data filed at 05/15/2017 0930 Gross per 24 hour  Intake 2052.5 ml  Output 1255 ml  Net 797.5 ml   [employer]  05/13/17 1034 05/13/17 1719 05/15/17 0255  Weight: 50.3 kg (111 lb) 50.3 kg (110 lb 14.3 oz) 52.6 kg (115 lb 15.4 oz)    Examination:  Gen: Awake, Alert, Oriented X 3, thinly builtfrail male HEENT: PERRLA, Neck supple, no JVD Lungs: decreased breath sounds at both bases CVS: RRR,No Gallops,Rubs or new Murmurs Abd: soft, mildly distended, mild diffuse tenderness, Bowel sounds present, colostomy with brown stool Extremities: No Cyanosis, Clubbing or edema Skin: no new rashes  Data Reviewed: I have personally reviewed following labs and imaging studies  CBC: Recent Labs  Lab 05/12/17 0424 05/13/17 0412 05/13/17 2325 05/14/17 0522 05/15/17 0354  WBC 10.6* 11.3* 19.0* 17.2* 13.9*  NEUTROABS 6.7 7.1  --  14.1* 10.7*  HGB 10.3* 10.1* 11.9* 10.4* 8.7*  HCT 32.4* 31.9* 37.5* 33.6* 28.0*  MCV 94.5 94.4 96.2 94.9 96.6  PLT 509*  524* 501* 494* 938*   Basic Metabolic Panel: Recent Labs  Lab 05/10/17 0428 05/11/17 0402 05/12/17 0424 05/13/17 0412 05/14/17 0522 05/15/17 0354  NA 138 137 135 138 139 139  K 3.8 3.5 3.2* 3.5 3.9 3.6  CL 102 99* 97* 101 104 107  CO2 27 28 27 27 25 24   GLUCOSE 99 111* 98 95 110* 86  BUN 26* 26* 24* 25* 19 14  CREATININE 1.32* 1.20 1.26* 1.09 1.29* 1.14  CALCIUM 8.5* 8.7* 8.8* 8.9 8.4* 8.4*  MG 2.3  --  1.7 1.8 1.7 1.7  PHOS  --   --   --   --   --  4.8*   GFR: Estimated Creatinine Clearance: 55.1 mL/min (by C-G formula based on SCr of 1.14 mg/dL). Liver Function Tests: Recent Labs  Lab 05/08/17 1539 05/15/17 0354  AST  --  14*  ALT  --  9*  ALKPHOS  --  71  BILITOT  --  0.3  PROT 6.3* 6.1*  ALBUMIN  --  2.0*   No results for input(s): LIPASE, AMYLASE in the last 168 hours. No results for input(s): AMMONIA in the last 168 hours. Coagulation Profile: No results for input(s): INR, PROTIME in the last 168 hours. Cardiac Enzymes: No results for input(s): CKTOTAL, CKMB, CKMBINDEX, TROPONINI in the last 168 hours. BNP (last 3 results) No results for input(s): PROBNP in the last 8760 hours. HbA1C: No results for input(s): HGBA1C in the last 72 hours. CBG: Recent Labs  Lab 05/09/17 1211 05/14/17 1820 05/15/17 0109 05/15/17 0410 05/15/17 1222  GLUCAP 97 98 91 87 81   Lipid Profile: Recent Labs    05/15/17 0354  TRIG 155*   Thyroid Function Tests: No results for input(s): TSH, T4TOTAL, FREET4, T3FREE, THYROIDAB in the last 72 hours. Anemia Panel: No results for input(s): VITAMINB12, FOLATE, FERRITIN, TIBC, IRON, RETICCTPCT in the last 72 hours. Sepsis Labs: No results for input(s): PROCALCITON, LATICACIDVEN in the last 168 hours.  Recent Results (from the past 240 hour(s))  Urine Culture     Status: Abnormal   Collection Time: 05/06/17  9:18 AM  Result Value Ref Range Status   Specimen Description URINE, RANDOM  Final   Special Requests NONE  Final    Culture 20,000 COLONIES/mL YEAST (A)  Final   Report Status 05/07/2017 FINAL  Final  C difficile quick scan w PCR reflex     Status: None   Collection Time: 05/06/17  9:18 AM  Result Value Ref Range Status   C Diff antigen NEGATIVE NEGATIVE Final   C Diff toxin NEGATIVE NEGATIVE Final  C Diff interpretation No C. difficile detected.  Final  Gram stain     Status: None   Collection Time: 05/08/17  3:15 PM  Result Value Ref Range Status   Specimen Description PLEURAL  Final   Special Requests RIGHT  Final   Gram Stain   Final    WBC PRESENT, PREDOMINANTLY MONONUCLEAR NO ORGANISMS SEEN CYTOSPIN SMEAR    Report Status 05/09/2017 FINAL  Final  Culture, body fluid-bottle     Status: None   Collection Time: 05/08/17  3:15 PM  Result Value Ref Range Status   Specimen Description PLEURAL  Final   Special Requests RIGHT  Final   Culture NO GROWTH 5 DAYS  Final   Report Status 05/13/2017 FINAL  Final  Surgical pcr screen     Status: None   Collection Time: 05/13/17  4:41 AM  Result Value Ref Range Status   MRSA, PCR NEGATIVE NEGATIVE Final   Staphylococcus aureus NEGATIVE NEGATIVE Final    Comment: (NOTE) The Xpert SA Assay (FDA approved for NASAL specimens in patients 62 years of age and older), is one component of a comprehensive surveillance program. It is not intended to diagnose infection nor to guide or monitor treatment.          Radiology Studies: No results found.      Scheduled Meds: . heparin subcutaneous  5,000 Units Subcutaneous Q8H  . insulin aspart  0-9 Units Subcutaneous Q6H  . mouth rinse  15 mL Mouth Rinse BID  . pantoprazole (PROTONIX) IV  40 mg Intravenous Q24H   Continuous Infusions: . methocarbamol (ROBAXIN)  IV    . piperacillin-tazobactam (ZOSYN)  IV Stopped (05/15/17 1305)  . TPN ADULT (ION)       LOS: 34 days        Domenic Polite, MD [facility] Page via [email or web address] password [email or web address]  If 7PM-7AM, please contact  night-coverage [email or web address] Password [email or web address] 05/15/2017, 2:54 PM

## 2017-05-15 NOTE — Evaluation (Signed)
Occupational Therapy Evaluation Patient Details Name: Dominic Phillips MRN: [id number] DOB: June 22, 1960 Today's Date: 05/15/2017    History of Present Illness Pt admitted to [facility] and transferred on 04/02/17 with c/o abdominal pain.  Pt presented with ETOH withdrawls, liver abscess and SBO on TPN.  10/23 presents with hypoxia and increased WBC count, 10/25 intubated, 10/26 scans show PNA and fluid overload, patient extubated on 04/17/17.  Chart review shows COPD, VDRF, anemia, AKI and hypokalemia in addition to previous listed statements.  PMH: ETOH abuse.  11/8 R thoracentesis.  05/13/17 pt underwent sigmoid colectomy with end colostomy.   Clinical Impression   Pt was cooperative throughout session, but somewhat guarded when discussing discharge plans. Pt is eager to go home. He presents with generalized weakness and decreased activity tolerance and impaired safety and awareness of deficits. He requires min to min guard assist for mobility and ADL. Will follow acutely.    Follow Up Recommendations  CIR    Equipment Recommendations  Tub/shower seat    Recommendations for Other Services       Precautions / Restrictions Precautions Precautions: Fall;Other (comment) Precaution Comments: drains, colostomy bag Restrictions Weight Bearing Restrictions: No      Mobility Bed Mobility Overal bed mobility: Needs Assistance Bed Mobility: Rolling;Sidelying to Sit Rolling: Min guard Sidelying to sit: Min assist   Sit to supine: Supervision   General bed mobility comments: encouraged log roll technique to minimize pain  Transfers Overall transfer level: Needs assistance Equipment used: Rolling walker (2 wheeled) Transfers: Sit to/from Stand Sit to Stand: Min guard         General transfer comment: min guard for safety, increased time and effort to power up, increased time to stabilize initial standing balance    Balance Overall balance assessment: Needs  assistance Sitting-balance support: No upper extremity supported;Feet supported Sitting balance-Leahy Scale: Good     Standing balance support: During functional activity;Bilateral upper extremity supported Standing balance-Leahy Scale: Fair Standing balance comment: can release walker in static standing, RW for ambulation                           ADL either performed or assessed with clinical judgement   ADL Overall ADL's : Needs assistance/impaired Eating/Feeding: Independent;Sitting   Grooming: Wash/dry hands;Standing;Min guard   Upper Body Bathing: Supervision/ safety;Sitting   Lower Body Bathing: Min guard;Sit to/from stand   Upper Body Dressing : Minimal assistance;Sitting   Lower Body Dressing: Min guard;Sit to/from stand   Toilet Transfer: Min guard;Ambulation;RW   Toileting- Water quality scientist and Hygiene: Min guard;Sit to/from stand       Functional mobility during ADLs: Min guard;Rolling walker       Vision Baseline Vision/History: Wears glasses Wears Glasses: At all times Patient Visual Report: No change from baseline;Blurring of vision       Perception     Praxis      Pertinent Vitals/Pain Pain Assessment: Faces Pain Score: 10-Worst pain ever Faces Pain Scale: Hurts a little bit Pain Location: abdomen Pain Descriptors / Indicators: Grimacing;Guarding Pain Intervention(s): Monitored during session     Hand Dominance Right   Extremity/Trunk Assessment Upper Extremity Assessment Upper Extremity Assessment: Generalized weakness   Lower Extremity Assessment Lower Extremity Assessment: Defer to PT evaluation   Cervical / Trunk Assessment Cervical / Trunk Assessment: Kyphotic   Communication Communication Communication: No difficulties   Cognition Arousal/Alertness: Awake/alert Behavior During Therapy: Flat affect Overall Cognitive Status: Impaired/Different from baseline Area of  Impairment: Memory;Safety/judgement                      Memory: Decreased short-term memory   Safety/Judgement: Decreased awareness of safety;Decreased awareness of deficits     General Comments: difficult to assess, pt with guarded behavior, minimally conversive   General Comments       Exercises     Shoulder Instructions      Home Living Family/patient expects to be discharged to:: Private residence Living Arrangements: Children(son is bedbound) Available Help at Discharge: Friend(s);Family;Available 24 hours/day;Personal care attendant(CNA cares 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) Type of Home: House Home Access: Ramped entrance     Home Layout: One level     Bathroom Shower/Tub: Teacher, early years/pre: Standard                Prior Functioning/Environment Level of Independence: Independent                 OT Problem List: Decreased strength;Decreased activity tolerance;Impaired balance (sitting and/or standing);Decreased cognition;Decreased safety awareness;Decreased knowledge of use of DME or AE;Pain      OT Treatment/Interventions: Self-care/ADL training;DME and/or AE instruction;Patient/family education;Balance training;Cognitive remediation/compensation;Therapeutic activities    OT Goals(Current goals can be found in the care plan section) Acute Rehab OT Goals Patient Stated Goal: wants to get back home and back to independence to care for his son OT Goal Formulation: With patient Time For Goal Achievement: 05/29/17 Potential to Achieve Goals: Good ADL Goals Pt Will Transfer to Toilet: with modified independence;ambulating;regular height toilet(will stand to urinate) Pt Will Perform Toileting - Clothing Manipulation and hygiene: with modified independence;sit to/from stand Pt Will Perform Tub/Shower Transfer: with modified independence;ambulating;Tub transfer;shower seat Additional ADL Goal #1: Pt will perform bathing and dressing modified independently.  OT Frequency: Min 2X/week   Barriers  to D/C:            Co-evaluation              AM-PAC PT "6 Clicks" Daily Activity     Outcome Measure Help from another person eating meals?: None Help from another person taking care of personal grooming?: A Little Help from another person toileting, which includes using toliet, bedpan, or urinal?: A Little Help from another person bathing (including washing, rinsing, drying)?: A Little Help from another person to put on and taking off regular upper body clothing?: A Little Help from another person to put on and taking off regular lower body clothing?: A Little 6 Click Score: 19   End of Session Equipment Utilized During Treatment: Rolling walker;Gait belt  Activity Tolerance: Patient tolerated treatment well Patient left: in bed;with call bell/phone within reach;with bed alarm set  OT Visit Diagnosis: Other abnormalities of gait and mobility (R26.89);Pain;Other symptoms and signs involving cognitive function;Muscle weakness (generalized) (M62.81)                Time: 7017-7939 OT Time Calculation (min): 20 min Charges:  OT General Charges $OT Visit: 1 Visit OT Evaluation $OT Eval Moderate Complexity: 1 Mod G-Codes:     2017/06/06 Nestor Lewandowsky, OTR/L Pager: [phone]  Werner Lean, Haze Boyden 06/06/2017, 3:26 PM

## 2017-05-16 LAB — GLUCOSE, CAPILLARY
GLUCOSE-CAPILLARY: 156 mg/dL — AB (ref 65–99)
GLUCOSE-CAPILLARY: 109 mg/dL — AB (ref 65–99)
Glucose-Capillary: 125 mg/dL — ABNORMAL HIGH (ref 65–99)

## 2017-05-16 LAB — BASIC METABOLIC PANEL
ANION GAP: 9 (ref 5–15)
BUN: 9 mg/dL (ref 6–20)
CHLORIDE: 106 mmol/L (ref 101–111)
CO2: 23 mmol/L (ref 22–32)
Calcium: 8.2 mg/dL — ABNORMAL LOW (ref 8.9–10.3)
Creatinine, Ser: 0.86 mg/dL (ref 0.61–1.24)
GFR calc non Af Amer: >60 mL/min (ref >60)
Glucose, Bld: 124 mg/dL — ABNORMAL HIGH (ref 65–99)
Potassium: 3.5 mmol/L (ref 3.5–5.1)
Sodium: 138 mmol/L (ref 135–145)

## 2017-05-16 LAB — CBC
HEMATOCRIT: 26.5 % — AB (ref 39.0–52.0)
HEMOGLOBIN: 8.3 g/dL — AB (ref 13.0–17.0)
MCH: 30 pg (ref 26.0–34.0)
MCHC: 31.3 g/dL (ref 30.0–36.0)
MCV: 95.7 fL (ref 78.0–100.0)
Platelets: 507 10*3/uL — ABNORMAL HIGH (ref 150–400)
RBC: 2.77 MIL/uL — ABNORMAL LOW (ref 4.22–5.81)
RDW: 16.2 % — ABNORMAL HIGH (ref 11.5–15.5)
WBC: 12.8 10*3/uL — AB (ref 4.0–10.5)

## 2017-05-16 LAB — PHOSPHORUS: Phosphorus: 2.8 mg/dL (ref 2.5–4.6)

## 2017-05-16 LAB — MAGNESIUM: Magnesium: 1.8 mg/dL (ref 1.7–2.4)

## 2017-05-16 MED ORDER — ACETAMINOPHEN 325 MG PO TABS
650.0000 mg | ORAL_TABLET | Freq: Four times a day (QID) | ORAL | Status: DC
  Administered 2017-05-16 – 2017-05-20 (×16): 650 mg via ORAL
  Filled 2017-05-16 (×16): qty 2

## 2017-05-16 MED ORDER — ONDANSETRON HCL 4 MG/2ML IJ SOLN: 4.0000 mg | Freq: Four times a day (QID) | INTRAMUSCULAR | Status: DC | PRN

## 2017-05-16 MED ORDER — OXYCODONE HCL 5 MG PO TABS
5.0000 mg | ORAL_TABLET | ORAL | Status: DC | PRN
  Administered 2017-05-16 – 2017-05-20 (×16): 10 mg via ORAL
  Filled 2017-05-16 (×16): qty 2

## 2017-05-16 MED ORDER — POTASSIUM PHOSPHATES 15 MMOLE/5ML IV SOLN
10.0000 mmol | Freq: Once | INTRAVENOUS | Status: AC
  Administered 2017-05-16: 10 mmol via INTRAVENOUS
  Filled 2017-05-16: qty 3.33

## 2017-05-16 MED ORDER — TRAVASOL 10 % IV SOLN
INTRAVENOUS | Status: AC
  Administered 2017-05-16: 17:00:00 via INTRAVENOUS
  Filled 2017-05-16: qty 732

## 2017-05-16 MED ORDER — POTASSIUM CHLORIDE 10 MEQ/50ML IV SOLN
10.0000 meq | INTRAVENOUS | Status: AC
  Administered 2017-05-16 (×3): 10 meq via INTRAVENOUS
  Filled 2017-05-16 (×4): qty 50

## 2017-05-16 NOTE — Progress Notes (Signed)
PHARMACY - ADULT TOTAL PARENTERAL NUTRITION CONSULT NOTE   Pharmacy Consult:  TPN Indication:  Prolonged ileus / Colocutaneous fistula  Patient Measurements: Height: 5\' 11"  (180.3 cm) Weight: 115 lb 15.4 oz (52.6 kg) IBW/kg (Calculated) : 75.3 TPN AdjBW (KG): 50.3 Body mass index is 16.17 kg/m.  Assessment:  11 YOM admitted with diverticulitis with perforation and abscess s/p IR drain, transferred from [facility] due to respiratory failure. Earlier hospital course complicated by DTs, acute respiratory failure with hypoxia, ARDS, aspiration pneumonia, and circulatory shock requiring intubation and pressors. Also ARF requiring dialysis, now improved and Renal signed off. 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 NGT came out overnight, patient does not want it replaced. Pharmacy consulted to manage TPN for expected prolonged ileus on 05/15/17. Poor PO intake noted for several weeks, RD documented "moderate malnutrition" in assessment 05/12/17.  GI: no NG tube per patient request.  Prealbumin 19.1 > 14.3, drain O/P 58mL, stool O/P 70mL - PPI IV Endo: CBGs trending up post TPN but remain < 180 Insulin requirements in the past 24 hours: 0 unit Lytes: K+ 3.5 (goal >/= 4 for ileus), Phos 4.8 > 2.8, Mag 1.8 (goal >/= 2 for ileus) Renal: s/p RRT - SCr down 0.86, BUN WNL - UOP 0.7 ml/kg/hr Pulm: COPD - bilateral thoracentesis 11/23 for pleural effusions.  2L [place] >> RA Cards: VSS Hepatobil: LFTs WNL.  TG mildly elevated 155 Neuro: EtOH use - Robaxin, PRN Dilaudid. Thiamine/folate/multivitamin in TPN. ID: Zosyn D#3/7 post-op for IAI. Cdiff negative. Afebrile, WBC down to 17.2 Best Practices: SQ heparin TPN Access: PICC placed 05/14/17 TPN start date: 05/15/17  Nutritional Goals (per RD recommendation on 11/27): 2000-2200 kCal and 100-115 gm protein per day Fluid: 2-2.2 L  Current Nutrition:  TPN   Plan:  - Increase TPN slightly to 50 ml/hr d/t possible  refeeding.  Goal TPN rate ~80 ml/hr. - Today's TPN will provide 73 gm AA, 216 gm CHO, and 25 gm lipid (reduced d/t mildly elevated TG), which equals to 1279 kCal.  TPN meeting 64% of kCal and 73% of protein needs. - Electrolytes in TPN: increase Mag / K / Phos, CL:AC 1:1 - Daily multivitamin and trace elements in TPN - Daily thiamine and folic acid to TPN d/t heavy EtOH use - Continue sensitive SSI Q6H.  D/C if CBGs remain controlled at goal TPN rate. - KCL x 3 runs - KPhos 10 mmol IV x 1 - F/U AM labs   Denise Bramblett D. [name], PharmD, BCPS Pager:  [phone] 05/16/2017, 7:53 AM

## 2017-05-16 NOTE — Progress Notes (Signed)
Patient ID: Dominic Phillips, male   DOB: May 29, 1961, 56 y.o.   MRN: [id number]  PROGRESS NOTE    Dominic Phillips  IFO:[id number] DOB: 1960/06/30 DOA: 04/11/2017 PCP: No primary care provider on file.   Brief Narrative:  56 year old male with past medical history of alcohol abuse presented to [facility] on 04/02/2017 with abdominal pain without fevers or leukocytosis. CT abdomen revealed pericolonic inflammation/fluid collection and he was admitted for diverticular abscess. General surgery was consulted who suggested placement of percutaneous drain by IR. Cultures grew Escherichia coli. Hospital course complicated by DTs, acute respiratory failure with hypoxia/ARDS/aspiration pneumonia and circulatory shock for which he required intubation and pressors; acute kidney injury for which she required CRRT and hemodialysis 1. Shock has resolved. Currently extubated and on room air. Renal function is improving. Nephrology signed off. Then underwent sigmoid colectomy and end ostomy on 11/28 by Dr. Donne Hazel Now with post op ileus , TNA started 11/30  Assessment & Plan:   Acute hypoxic respiratory failure/ARDS with bilateral pleural effusions - Multifactorial second to pulmonary edema versus pneumonia/atelectasis - resolved - Currently on room air. Pulmonary following. 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 Remains on IV Zosyn.  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 - no improvement with nonoperative management finally and that ended up with sigmoid colectomy and end ostomy on 11/28 by Dr. Donne Hazel -Now with post op  ileus, no return of bowel function yet, started TNA 11/30 -Per Dr.Wakefield continue Abx 7days port op, POD 3 now -clears today per CCS, having stool in stoma  Hypokalemia -Improved, replace  Acute kidney injury status post CRRT on 04/11/2017 and intermittent dialysis -due to sepsis, resolved -Renal function improved and stable. Nephrology has signed off -Follow renal function intermittently -HD catheter removed on 04/25/2017  Anemia of chronic disease - Status post 1 unit of packed red cells transfusion -Continue iron replacement -Monitor hemoglobin -No signs of acute bleeding  Thrombocytosis - probably reactive; monitor  Moderate malnutrition of chronic illness -Follow nutrition recommendations  DVT prophylaxis: Heparin Code Status:  Partial with no prolonged mechanical ventilation and intubation Family Communication: no family at bedside Disposition Plan: CIR vs  SNF when improved  Consultants:  General surgery IR PCCM   Procedures: IR drain in [facility] on 04/02/2017 CRRT Hemodialysis 1 Removal of HD dialysis catheter Intubation and extubation from 04/11/2017-04/17/17 Thoracentesis 2  Antimicrobials:  Anti-infectives (From admission, onward)   Start     Dose/Rate Route Frequency Ordered Stop   05/08/17 1000  fluconazole (DIFLUCAN) tablet 100 mg     100 mg Oral Daily 05/08/17 0603 05/12/17 0817   05/04/17 0130  piperacillin-tazobactam (ZOSYN) IVPB 3.375 g     3.375 g 12.5 mL/hr over 240 Minutes Intravenous Every 8 hours 05/03/17 2211     04/28/17 1530  vancomycin (VANCOCIN) IVPB 750 mg/150 ml premix  Status:  Discontinued     750 mg 150 mL/hr over 60 Minutes Intravenous Every 24 hours 04/28/17 1424 05/01/17 1128   04/27/17 1400  piperacillin-tazobactam (ZOSYN) IVPB 3.375 g  Status:  Discontinued     3.375 g 12.5 mL/hr over 240 Minutes Intravenous Every 8 hours 04/27/17 1158 05/03/17 2211   04/26/17  1830  vancomycin (VANCOCIN) 1,250 mg in sodium  chloride 0.9 % 250 mL IVPB  Status:  Discontinued     1,250 mg 166.7 mL/hr over 90 Minutes Intravenous Every 48 hours 04/26/17 1739 04/28/17 1424   04/24/17 1700  vancomycin (VANCOCIN) IVPB 1000 mg/200 mL premix  Status:  Discontinued     1,000 mg 200 mL/hr over 60 Minutes Intravenous  Once 04/24/17 1652 04/24/17 1656   04/24/17 1700  vancomycin (VANCOCIN) IVPB 1000 mg/200 mL premix  Status:  Discontinued     1,000 mg 200 mL/hr over 60 Minutes Intravenous Every 48 hours 04/24/17 1656 04/26/17 1739   04/23/17 1500  piperacillin-tazobactam (ZOSYN) IVPB 3.375 g  Status:  Discontinued     3.375 g 12.5 mL/hr over 240 Minutes Intravenous Every 12 hours 04/23/17 1107 04/27/17 1158   04/18/17 1800  piperacillin-tazobactam (ZOSYN) IVPB 3.375 g  Status:  Discontinued     3.375 g 12.5 mL/hr over 240 Minutes Intravenous Every 12 hours 04/18/17 1032 04/23/17 1107   04/18/17 1400  piperacillin-tazobactam (ZOSYN) IVPB 3.375 g  Status:  Discontinued     3.375 g 12.5 mL/hr over 240 Minutes Intravenous Every 8 hours 04/18/17 1031 04/18/17 1032   04/17/17 1200  piperacillin-tazobactam (ZOSYN) IVPB 3.375 g  Status:  Discontinued     3.375 g 100 mL/hr over 30 Minutes Intravenous Every 6 hours 04/17/17 1030 04/18/17 1031   04/17/17 1100  piperacillin-tazobactam (ZOSYN) IVPB 3.375 g  Status:  Discontinued     3.375 g 12.5 mL/hr over 240 Minutes Intravenous Every 8 hours 04/17/17 1019 04/17/17 1030   04/15/17 2125  Ampicillin-Sulbactam (UNASYN) 3 g in sodium chloride 0.9 % 100 mL IVPB  Status:  Discontinued     3 g 200 mL/hr over 30 Minutes Intravenous Every 8 hours 04/15/17 1529 04/17/17 1019   04/15/17 1100  ampicillin-sulbactam (UNASYN) 1.5 g in sodium chloride 0.9 % 50 mL IVPB  Status:  Discontinued     1.5 g 100 mL/hr over 30 Minutes Intravenous Every 12 hours 04/15/17 0937 04/15/17 1529   04/13/17 2200  levofloxacin (LEVAQUIN) IVPB 500 mg  Status:  Discontinued     500 mg 100 mL/hr over 60 Minutes  Intravenous Every 48 hours 04/12/17 0114 04/12/17 0846   04/13/17 1800  piperacillin-tazobactam (ZOSYN) IVPB 2.25 g  Status:  Discontinued     2.25 g 100 mL/hr over 30 Minutes Intravenous Every 8 hours 04/13/17 1050 04/15/17 0937   04/12/17 0200  piperacillin-tazobactam (ZOSYN) IVPB 3.375 g  Status:  Discontinued     3.375 g 12.5 mL/hr over 240 Minutes Intravenous Every 8 hours 04/12/17 0103 04/13/17 1050   04/12/17 0000  levofloxacin (LEVAQUIN) IVPB 750 mg  Status:  Discontinued     750 mg 100 mL/hr over 90 Minutes Intravenous Every 48 hours 04/11/17 2230 04/12/17 0114       Subjective: -some abd pain, no N/V, having stool in ostomy  Objective: Vitals:   05/15/17 1426 05/15/17 2231 05/16/17 0611 05/16/17 1401  BP: 125/65 129/78 126/74 132/85  Pulse: 84 92 84 91  Resp: 18 18 16 15   Temp: 98 F (36.7 C) 98 F (36.7 C) 98 F (36.7 C) 98.3 F (36.8 C)  TempSrc: Oral Oral  Oral  SpO2: 99% 98% 99% 100%  Weight:      Height:        Intake/Output Summary (Last 24 hours) at 05/16/2017 1431 Last data filed at 05/16/2017 1422 Gross per 24 hour  Intake 1555.17 ml  Output 1140 ml  Net 415.17 ml   Filed Weights   05/13/17 1034 05/13/17 1719 05/15/17 0255  Weight: 50.3 kg (111 lb) 50.3 kg (110 lb 14.3 oz) 52.6 kg (115 lb 15.4 oz)    Examination:  Gen: Awake, Alert, Oriented X 3, thinly built frail male, no distress HEENT: PERRLA, Neck supple, no JVD Lungs: decreased breath sounds at both bases CVS: RRR,No Gallops,Rubs or new Murmurs Abd: soft, mildly distended, mild diffuse tenderness, Bowel sounds present, colostomy with brown stool Extremities: No Cyanosis, Clubbing or edema Skin: no new rashes  Data Reviewed: I have personally reviewed following labs and imaging studies  CBC: Recent Labs  Lab 05/12/17 0424 05/13/17 0412 05/13/17 2325 05/14/17 0522 05/15/17 0354 05/16/17 0000  WBC 10.6* 11.3* 19.0* 17.2* 13.9* 12.8*  NEUTROABS 6.7 7.1  --  14.1* 10.7*  --   HGB  10.3* 10.1* 11.9* 10.4* 8.7* 8.3*  HCT 32.4* 31.9* 37.5* 33.6* 28.0* 26.5*  MCV 94.5 94.4 96.2 94.9 96.6 95.7  PLT 509* 524* 501* 494* 494* 481*   Basic Metabolic Panel: Recent Labs  Lab 05/12/17 0424 05/13/17 0412 05/14/17 0522 05/15/17 0354 05/16/17 0452  NA 135 138 139 139 138  K 3.2* 3.5 3.9 3.6 3.5  CL 97* 101 104 107 106  CO2 27 27 25 24 23   GLUCOSE 98 95 110* 86 124*  BUN 24* 25* 19 14 9   CREATININE 1.26* 1.09 1.29* 1.14 0.86  CALCIUM 8.8* 8.9 8.4* 8.4* 8.2*  MG 1.7 1.8 1.7 1.7 1.8  PHOS  --   --   --  4.8* 2.8   GFR: Estimated Creatinine Clearance: 73.1 mL/min (by C-G formula based on SCr of 0.86 mg/dL). Liver Function Tests: Recent Labs  Lab 05/15/17 0354  AST 14*  ALT 9*  ALKPHOS 71  BILITOT 0.3  PROT 6.1*  ALBUMIN 2.0*   No results for input(s): LIPASE, AMYLASE in the last 168 hours. No results for input(s): AMMONIA in the last 168 hours. Coagulation Profile: No results for input(s): INR, PROTIME in the last 168 hours. Cardiac Enzymes: No results for input(s): CKTOTAL, CKMB, CKMBINDEX, TROPONINI in the last 168 hours. BNP (last 3 results) No results for input(s): PROBNP in the last 8760 hours. HbA1C: No results for input(s): HGBA1C in the last 72 hours. CBG: Recent Labs  Lab 05/15/17 0410 05/15/17 1222 05/15/17 2346 05/16/17 0613 05/16/17 1201  GLUCAP 87 81 110* 125* 156*   Lipid Profile: Recent Labs    05/15/17 0354  TRIG 155*   Thyroid Function Tests: No results for input(s): TSH, T4TOTAL, FREET4, T3FREE, THYROIDAB in the last 72 hours. Anemia Panel: No results for input(s): VITAMINB12, FOLATE, FERRITIN, TIBC, IRON, RETICCTPCT in the last 72 hours. Sepsis Labs: No results for input(s): PROCALCITON, LATICACIDVEN in the last 168 hours.  Recent Results (from the past 240 hour(s))  Gram stain     Status: None   Collection Time: 05/08/17  3:15 PM  Result Value Ref Range Status   Specimen Description PLEURAL  Final   Special Requests  RIGHT  Final   Gram Stain   Final    WBC PRESENT, PREDOMINANTLY MONONUCLEAR NO ORGANISMS SEEN CYTOSPIN SMEAR    Report Status 05/09/2017 FINAL  Final  Culture, body fluid-bottle     Status: None   Collection Time: 05/08/17  3:15 PM  Result Value Ref Range Status   Specimen Description PLEURAL  Final   Special Requests RIGHT  Final   Culture NO GROWTH 5 DAYS  Final   Report Status 05/13/2017 FINAL  Final  Surgical pcr screen     Status: None   Collection Time: 05/13/17  4:41 AM  Result Value Ref Range Status   MRSA, PCR NEGATIVE NEGATIVE Final   Staphylococcus aureus NEGATIVE NEGATIVE Final    Comment: (NOTE) The Xpert SA Assay (FDA approved for NASAL specimens in patients 84 years of age and older), is one component of a comprehensive surveillance program. It is not intended to diagnose infection nor to guide or monitor treatment.          Radiology Studies: No results found.      Scheduled Meds: . acetaminophen  650 mg Oral Q6H  . heparin subcutaneous  5,000 Units Subcutaneous Q8H  . insulin aspart  0-9 Units Subcutaneous Q6H  . mouth rinse  15 mL Mouth Rinse BID  . pantoprazole (PROTONIX) IV  40 mg Intravenous Q24H   Continuous Infusions: . methocarbamol (ROBAXIN)  IV 1,000 mg (05/16/17 1330)  . piperacillin-tazobactam (ZOSYN)  IV Stopped (05/16/17 1345)  . potassium PHOSPHATE IVPB (mmol) 10 mmol (05/16/17 0950)  . TPN ADULT (ION) 35 mL/hr at 05/15/17 1814  . TPN ADULT (ION)       LOS: 35 days        Domenic Polite, MD [facility] Page via [email or web address] password [email or web address]  If 7PM-7AM, please contact night-coverage [email or web address] Password [email or web address] 05/16/2017, 2:31 PM

## 2017-05-16 NOTE — Progress Notes (Signed)
[facility]/Trauma Progress Note  3 Days Post-Op   Assessment/Plan Hx of tobacco use Hx of alcohol abuse Anemia- hgb 10.4, hct 33.6, follow Chronic kidney disease  Acute diverticulitis with perforation. -Randolphadm10/18/18 diverticulitis with drain placement  -Transferred due to respiratory failure10/27/2018. -Earlier hospital course, complicated by DTs, acute respiratory failure with hypoxia, ARDS, aspiration pneumonia and circulatory shock which required intubation and pressors. Also ARF requiring dialysis.Lungs are improving. Colocutaneous fistula - S/PSigmoid colectomy, end colostomy11/28/18 Dr. Rolm Bookbinder  - afebrile, VSS, WBC 12.8 - BID wet to dry to midline incision - OOB/mobilize and use IS - PT/OT  pleural effusions -bilateral thoracentesis11/23 Loculated left-sided ascites.600 mL aspirated. Cultures negative.   ZOX:WRUEAV, TPN WU:JWJX stopped 11/16, IV zosyn (11/2=>>day29) Plan to continue IV abx for a total of 7 days post-op DVT: SQ heparin Foley: D/C11/29 Follow up: Dr. Rolm Bookbinder  Plan: clears, mobilize, IS, continue TPN for now    LOS: 35 days    Subjective:  CC: abdominal pain  Pt is in a lot of pain this am. He has mild nausea. His last pain medicine does was roughly 0200. No vomiting. Pt has been drinking a little water.   Objective: Vital signs in last 24 hours: Temp:  [98 F (36.7 C)] 98 F (36.7 C) (12/01 0611) Pulse Rate:  [84-92] 84 (12/01 0611) Resp:  [16-18] 16 (12/01 0611) BP: (125-129)/(65-78) 126/74 (12/01 0611) SpO2:  [98 %-99 %] 99 % (12/01 0611) Last BM Date: 05/15/17  Intake/Output from previous day: 11/30 0701 - 12/01 0700 In: 620.9 [P.O.:60; I.V.:400.9; IV Piggyback:160] Out: 880 [Urine:825; Drains:30; Stool:25] Intake/Output this shift: Total I/O In: -  Out: 250 [Urine:250]  PE: Gen: Alert, NAD, cooperative Card: Regular rate and rhythm, pedal pulses 2+  BL Pulm: Normal effort and rate Abd: Soft,+BS, not distended, appropriately tender, midline incision granulating nicely - no drainage or surrounding erythema, LLQ stoma viable with liquid brown stool in bag. JP drains are serosanguinous Extremities: PICC in RUQ Skin: warm and dry, no rashes  Psych: A&Ox3     Anti-infectives: Anti-infectives (From admission, onward)   Start     Dose/Rate Route Frequency Ordered Stop   05/08/17 1000  fluconazole (DIFLUCAN) tablet 100 mg     100 mg Oral Daily 05/08/17 0603 05/12/17 0817   05/04/17 0130  piperacillin-tazobactam (ZOSYN) IVPB 3.375 g     3.375 g 12.5 mL/hr over 240 Minutes Intravenous Every 8 hours 05/03/17 2211     04/28/17 1530  vancomycin (VANCOCIN) IVPB 750 mg/150 ml premix  Status:  Discontinued     750 mg 150 mL/hr over 60 Minutes Intravenous Every 24 hours 04/28/17 1424 05/01/17 1128   04/27/17 1400  piperacillin-tazobactam (ZOSYN) IVPB 3.375 g  Status:  Discontinued     3.375 g 12.5 mL/hr over 240 Minutes Intravenous Every 8 hours 04/27/17 1158 05/03/17 2211   04/26/17 1830  vancomycin (VANCOCIN) 1,250 mg in sodium chloride 0.9 % 250 mL IVPB  Status:  Discontinued     1,250 mg 166.7 mL/hr over 90 Minutes Intravenous Every 48 hours 04/26/17 1739 04/28/17 1424   04/24/17 1700  vancomycin (VANCOCIN) IVPB 1000 mg/200 mL premix  Status:  Discontinued     1,000 mg 200 mL/hr over 60 Minutes Intravenous  Once 04/24/17 1652 04/24/17 1656   04/24/17 1700  vancomycin (VANCOCIN) IVPB 1000 mg/200 mL premix  Status:  Discontinued     1,000 mg 200 mL/hr over 60 Minutes Intravenous Every 48 hours 04/24/17 1656 04/26/17 1739  04/23/17 1500  piperacillin-tazobactam (ZOSYN) IVPB 3.375 g  Status:  Discontinued     3.375 g 12.5 mL/hr over 240 Minutes Intravenous Every 12 hours 04/23/17 1107 04/27/17 1158   04/18/17 1800  piperacillin-tazobactam (ZOSYN) IVPB 3.375 g  Status:  Discontinued     3.375 g 12.5 mL/hr over 240 Minutes Intravenous Every  12 hours 04/18/17 1032 04/23/17 1107   04/18/17 1400  piperacillin-tazobactam (ZOSYN) IVPB 3.375 g  Status:  Discontinued     3.375 g 12.5 mL/hr over 240 Minutes Intravenous Every 8 hours 04/18/17 1031 04/18/17 1032   04/17/17 1200  piperacillin-tazobactam (ZOSYN) IVPB 3.375 g  Status:  Discontinued     3.375 g 100 mL/hr over 30 Minutes Intravenous Every 6 hours 04/17/17 1030 04/18/17 1031   04/17/17 1100  piperacillin-tazobactam (ZOSYN) IVPB 3.375 g  Status:  Discontinued     3.375 g 12.5 mL/hr over 240 Minutes Intravenous Every 8 hours 04/17/17 1019 04/17/17 1030   04/15/17 2125  Ampicillin-Sulbactam (UNASYN) 3 g in sodium chloride 0.9 % 100 mL IVPB  Status:  Discontinued     3 g 200 mL/hr over 30 Minutes Intravenous Every 8 hours 04/15/17 1529 04/17/17 1019   04/15/17 1100  ampicillin-sulbactam (UNASYN) 1.5 g in sodium chloride 0.9 % 50 mL IVPB  Status:  Discontinued     1.5 g 100 mL/hr over 30 Minutes Intravenous Every 12 hours 04/15/17 0937 04/15/17 1529   04/13/17 2200  levofloxacin (LEVAQUIN) IVPB 500 mg  Status:  Discontinued     500 mg 100 mL/hr over 60 Minutes Intravenous Every 48 hours 04/12/17 0114 04/12/17 0846   04/13/17 1800  piperacillin-tazobactam (ZOSYN) IVPB 2.25 g  Status:  Discontinued     2.25 g 100 mL/hr over 30 Minutes Intravenous Every 8 hours 04/13/17 1050 04/15/17 0937   04/12/17 0200  piperacillin-tazobactam (ZOSYN) IVPB 3.375 g  Status:  Discontinued     3.375 g 12.5 mL/hr over 240 Minutes Intravenous Every 8 hours 04/12/17 0103 04/13/17 1050   04/12/17 0000  levofloxacin (LEVAQUIN) IVPB 750 mg  Status:  Discontinued     750 mg 100 mL/hr over 90 Minutes Intravenous Every 48 hours 04/11/17 2230 04/12/17 0114      Lab Results:  Recent Labs    05/15/17 0354 05/16/17 0000  WBC 13.9* 12.8*  HGB 8.7* 8.3*  HCT 28.0* 26.5*  PLT 494* 507*   BMET Recent Labs    05/15/17 0354 05/16/17 0452  NA 139 138  K 3.6 3.5  CL 107 106  CO2 24 23  GLUCOSE 86  124*  BUN 14 9  CREATININE 1.14 0.86  CALCIUM 8.4* 8.2*   PT/INR No results for input(s): LABPROT, INR in the last 72 hours. CMP     Component Value Date/Time   NA 138 05/16/2017 0452   K 3.5 05/16/2017 0452   CL 106 05/16/2017 0452   CO2 23 05/16/2017 0452   GLUCOSE 124 (H) 05/16/2017 0452   BUN 9 05/16/2017 0452   CREATININE 0.86 05/16/2017 0452   CALCIUM 8.2 (L) 05/16/2017 0452   PROT 6.1 (L) 05/15/2017 0354   ALBUMIN 2.0 (L) 05/15/2017 0354   AST 14 (L) 05/15/2017 0354   ALT 9 (L) 05/15/2017 0354   ALKPHOS 71 05/15/2017 0354   BILITOT 0.3 05/15/2017 0354   GFRNONAA >60 05/16/2017 0452   GFRAA >60 05/16/2017 0452   Lipase  No results found for: LIPASE  Studies/Results: No results found.    Kalman Drape , PA-C  [facility] 05/16/2017, 8:12 AM Pager: [phone] Consults: [phone] Mon-Fri 7:00 am-4:30 pm Sat-Sun 7:00 am-11:30 am

## 2017-05-17 LAB — BASIC METABOLIC PANEL
Anion gap: 6 (ref 5–15)
BUN: 11 mg/dL (ref 6–20)
CHLORIDE: 106 mmol/L (ref 101–111)
CO2: 23 mmol/L (ref 22–32)
Calcium: 8.8 mg/dL — ABNORMAL LOW (ref 8.9–10.3)
Creatinine, Ser: 0.76 mg/dL (ref 0.61–1.24)
GFR calc non Af Amer: >60 mL/min (ref >60)
Glucose, Bld: 109 mg/dL — ABNORMAL HIGH (ref 65–99)
POTASSIUM: 3.9 mmol/L (ref 3.5–5.1)
SODIUM: 135 mmol/L (ref 135–145)

## 2017-05-17 LAB — GLUCOSE, CAPILLARY
GLUCOSE-CAPILLARY: 115 mg/dL — AB (ref 65–99)
Glucose-Capillary: 116 mg/dL — ABNORMAL HIGH (ref 65–99)
Glucose-Capillary: 113 mg/dL — ABNORMAL HIGH (ref 65–99)

## 2017-05-17 LAB — PHOSPHORUS: PHOSPHORUS: 4.5 mg/dL (ref 2.5–4.6)

## 2017-05-17 LAB — MAGNESIUM: MAGNESIUM: 1.7 mg/dL (ref 1.7–2.4)

## 2017-05-17 MED ORDER — TRAVASOL 10 % IV SOLN
INTRAVENOUS | Status: AC
  Administered 2017-05-17: 18:00:00 via INTRAVENOUS
  Filled 2017-05-17: qty 1094.4

## 2017-05-17 MED ORDER — ENOXAPARIN SODIUM 40 MG/0.4ML ~~LOC~~ SOLN
40.0000 mg | SUBCUTANEOUS | Status: DC
  Administered 2017-05-17 – 2017-05-20 (×4): 40 mg via SUBCUTANEOUS
  Filled 2017-05-17 (×4): qty 0.4

## 2017-05-17 MED ORDER — MAGNESIUM SULFATE 2 GM/50ML IV SOLN
2.0000 g | Freq: Once | INTRAVENOUS | Status: AC
  Administered 2017-05-17: 2 g via INTRAVENOUS
  Filled 2017-05-17: qty 50

## 2017-05-17 MED ORDER — HYOSCYAMINE SULFATE 0.125 MG SL SUBL
0.1250 mg | SUBLINGUAL_TABLET | Freq: Once | SUBLINGUAL | Status: AC
  Administered 2017-05-17: 0.125 mg via SUBLINGUAL
  Filled 2017-05-17: qty 1

## 2017-05-17 MED ORDER — ENSURE ENLIVE PO LIQD
237.0000 mL | Freq: Two times a day (BID) | ORAL | Status: DC
  Administered 2017-05-17 – 2017-05-20 (×6): 237 mL via ORAL

## 2017-05-17 NOTE — Progress Notes (Signed)
PHARMACY - ADULT TOTAL PARENTERAL NUTRITION CONSULT NOTE   Pharmacy Consult:  TPN Indication:  Prolonged ileus / Colocutaneous fistula  Patient Measurements: Height: 5\' 11"  (180.3 cm) Weight: 115 lb 15.4 oz (52.6 kg) IBW/kg (Calculated) : 75.3 TPN AdjBW (KG): 50.3 Body mass index is 16.17 kg/m.  Assessment:  Dominic Phillips admitted with diverticulitis with perforation and abscess s/p IR drain, transferred from [place] due to respiratory failure. Earlier hospital course complicated by DTs, acute respiratory failure with hypoxia, ARDS, aspiration pneumonia, and circulatory shock requiring intubation and pressors. Also ARF requiring dialysis, now improved and Renal signed off. 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 NGT came out overnight, patient does not want it replaced. Pharmacy consulted to manage TPN for expected prolonged ileus on 05/15/17. Poor PO intake noted for several weeks, RD documented "moderate malnutrition" in assessment 05/12/17.  GI: no NG tube per patient request.  Prealbumin 19.1 > 14.3, drain O/P 56mL, stool O/P 367mL - PPI IV Endo: CBGs trending up post TPN but remain < 180 Insulin requirements in the past 24 hours: 3 units Lytes: K+ 3.9 (goal >/= 4 for ileus), Mag 1.7 (goal >/= 2 for ileus) Renal: s/p RRT - SCr down 0.76, BUN WNL - UOP 1.1 ml/kg/hr Pulm: COPD - bilateral thoracentesis 11/23 for pleural effusions.  Stable on RA Cards: VSS Hepatobil: LFTs WNL.  TG mildly elevated 155 Neuro: EtOH use - Robaxin, APAP, PRN Dilaudid/oxycodone. Thiamine/folate/multivitamin in TPN. ID: Zosyn D#4/7 post-op for IAI. Cdiff negative. Afebrile, WBC down to 17.2 Best Practices: SQ heparin TPN Access: PICC placed 05/14/17 TPN start date: 05/15/17  Nutritional Goals (per RD recommendation on 11/27): 2000-2200 kCal and 100-115 gm protein per day  Current Nutrition:  TPN Clear liquid diet   Plan:  Increase TPN to goal rate of 80 ml/hr TPN  will provide 2016 kCal and 109 gm of protein per day, meeting 100% of patient needs Electrolytes in TPN: increase Mag / K / Na, reduce phos slightly, Cl:Ac 1:1 Daily multivitamin and trace elements in TPN Daily thiamine and folic acid to TPN d/t heavy EtOH use Continue sensitive SSI Q6H.  D/C if CBGs remain controlled at goal TPN rate. Mag sulfate 2gm IV x 1 F/U AM labs, PO intake/diet advancement to wean off of TPN   Jakyrie Totherow D. [name], PharmD, BCPS Pager:  [phone] 05/17/2017, 8:01 AM

## 2017-05-17 NOTE — Progress Notes (Signed)
Patient ID: Dominic Phillips, male   DOB: 03-20-1961, 56 y.o.   MRN: [id number]  PROGRESS NOTE    Dominic Phillips  KGY:[id number] DOB: Dec 16, 1960 DOA: 04/11/2017 PCP: No primary care provider on file.   Brief Narrative:  56 year old male with past medical history of alcohol abuse presented to [facility] on 04/02/2017 with abdominal pain without fevers or leukocytosis. CT abdomen revealed pericolonic inflammation/fluid collection and he was admitted for diverticular abscess. General surgery was consulted who suggested placement of percutaneous drain by IR. Cultures grew Escherichia coli. Hospital course complicated by DTs, acute respiratory failure with hypoxia/ARDS/aspiration pneumonia and circulatory shock for which he required intubation and pressors; acute kidney injury for which she required CRRT and hemodialysis 1. Shock has resolved. Currently extubated and on room air. Renal function is improving. Nephrology signed off. Then underwent sigmoid colectomy and end ostomy on 11/28 by Dr. Donne Hazel Now with post op ileus , TNA started 11/30  Assessment & Plan:   Acute hypoxic respiratory failure/ARDS with bilateral pleural effusions - Multifactorial second to pulmonary edema versus pneumonia/atelectasis - resolved - Currently on room air. Pulmonary following. 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 Remains on IV Zosyn.  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 - no improvement with nonoperative management finally and that ended up with sigmoid colectomy and end ostomy on 11/28 by Dr. Donne Hazel -Now with post op  ileus, no return of bowel function yet, started TNA 11/30 -Per Dr.Wakefield continue Abx 7days port op, POD 4 now -diet being advanced to Full liq per CCS, having stool in stoma -wean off TNA as he tolerates a diet  Hypokalemia -Improved, replace  Acute kidney injury status post CRRT on 04/11/2017 and intermittent dialysis -due to sepsis, resolved -Renal function improved and stable. Nephrology has signed off -Follow renal function intermittently -HD catheter removed on 04/25/2017  Anemia of chronic disease - Status post 1 unit of packed red cells transfusion -Continue iron replacement -Monitor hemoglobin -No signs of acute bleeding  Thrombocytosis - probably reactive; monitor  Moderate malnutrition of chronic illness -Follow nutrition recommendations  DVT prophylaxis: Heparin Code Status:  Partial with no prolonged mechanical ventilation and intubation Family Communication: no family at bedside Disposition Plan: CIR vs  SNF when improved, ? 48hours  Consultants:  General surgery IR PCCM   Procedures: IR drain in [facility] on 04/02/2017 CRRT Hemodialysis 1 Removal of HD dialysis catheter Intubation and extubation from 04/11/2017-04/17/17 Thoracentesis 2  Antimicrobials:  Anti-infectives (From admission, onward)   Start     Dose/Rate Route Frequency Ordered Stop   05/08/17 1000  fluconazole (DIFLUCAN) tablet 100 mg     100 mg Oral Daily 05/08/17 0603 05/12/17 0817   05/04/17 0130  piperacillin-tazobactam (ZOSYN) IVPB 3.375 g     3.375 g 12.5 mL/hr over 240 Minutes Intravenous Every 8 hours 05/03/17 2211     04/28/17 1530  vancomycin (VANCOCIN) IVPB 750 mg/150 ml premix  Status:  Discontinued     750 mg 150 mL/hr over 60 Minutes Intravenous Every 24 hours 04/28/17 1424 05/01/17 1128   04/27/17 1400  piperacillin-tazobactam (ZOSYN) IVPB 3.375 g  Status:  Discontinued     3.375 g 12.5 mL/hr  over 240 Minutes Intravenous Every 8 hours 04/27/17 1158 05/03/17  2211   04/26/17 1830  vancomycin (VANCOCIN) 1,250 mg in sodium chloride 0.9 % 250 mL IVPB  Status:  Discontinued     1,250 mg 166.7 mL/hr over 90 Minutes Intravenous Every 48 hours 04/26/17 1739 04/28/17 1424   04/24/17 1700  vancomycin (VANCOCIN) IVPB 1000 mg/200 mL premix  Status:  Discontinued     1,000 mg 200 mL/hr over 60 Minutes Intravenous  Once 04/24/17 1652 04/24/17 1656   04/24/17 1700  vancomycin (VANCOCIN) IVPB 1000 mg/200 mL premix  Status:  Discontinued     1,000 mg 200 mL/hr over 60 Minutes Intravenous Every 48 hours 04/24/17 1656 04/26/17 1739   04/23/17 1500  piperacillin-tazobactam (ZOSYN) IVPB 3.375 g  Status:  Discontinued     3.375 g 12.5 mL/hr over 240 Minutes Intravenous Every 12 hours 04/23/17 1107 04/27/17 1158   04/18/17 1800  piperacillin-tazobactam (ZOSYN) IVPB 3.375 g  Status:  Discontinued     3.375 g 12.5 mL/hr over 240 Minutes Intravenous Every 12 hours 04/18/17 1032 04/23/17 1107   04/18/17 1400  piperacillin-tazobactam (ZOSYN) IVPB 3.375 g  Status:  Discontinued     3.375 g 12.5 mL/hr over 240 Minutes Intravenous Every 8 hours 04/18/17 1031 04/18/17 1032   04/17/17 1200  piperacillin-tazobactam (ZOSYN) IVPB 3.375 g  Status:  Discontinued     3.375 g 100 mL/hr over 30 Minutes Intravenous Every 6 hours 04/17/17 1030 04/18/17 1031   04/17/17 1100  piperacillin-tazobactam (ZOSYN) IVPB 3.375 g  Status:  Discontinued     3.375 g 12.5 mL/hr over 240 Minutes Intravenous Every 8 hours 04/17/17 1019 04/17/17 1030   04/15/17 2125  Ampicillin-Sulbactam (UNASYN) 3 g in sodium chloride 0.9 % 100 mL IVPB  Status:  Discontinued     3 g 200 mL/hr over 30 Minutes Intravenous Every 8 hours 04/15/17 1529 04/17/17 1019   04/15/17 1100  ampicillin-sulbactam (UNASYN) 1.5 g in sodium chloride 0.9 % 50 mL IVPB  Status:  Discontinued     1.5 g 100 mL/hr over 30 Minutes Intravenous Every 12 hours 04/15/17 0937 04/15/17 1529   04/13/17 2200  levofloxacin (LEVAQUIN) IVPB 500 mg   Status:  Discontinued     500 mg 100 mL/hr over 60 Minutes Intravenous Every 48 hours 04/12/17 0114 04/12/17 0846   04/13/17 1800  piperacillin-tazobactam (ZOSYN) IVPB 2.25 g  Status:  Discontinued     2.25 g 100 mL/hr over 30 Minutes Intravenous Every 8 hours 04/13/17 1050 04/15/17 0937   04/12/17 0200  piperacillin-tazobactam (ZOSYN) IVPB 3.375 g  Status:  Discontinued     3.375 g 12.5 mL/hr over 240 Minutes Intravenous Every 8 hours 04/12/17 0103 04/13/17 1050   04/12/17 0000  levofloxacin (LEVAQUIN) IVPB 750 mg  Status:  Discontinued     750 mg 100 mL/hr over 90 Minutes Intravenous Every 48 hours 04/11/17 2230 04/12/17 0114       Subjective: -continues to have intermittent abdominal pain and cramps -No vomiting, having stool in stoma, tolerating liquids  Objective: Vitals:   05/16/17 0611 05/16/17 1401 05/16/17 2115 05/17/17 0456  BP: 126/74 132/85 (!) 147/82 139/88  Pulse: 84 91 88 85  Resp: 16 15 17 16   Temp: 98 F (36.7 C) 98.3 F (36.8 C) 98.4 F (36.9 C) 98.2 F (36.8 C)  TempSrc:  Oral Oral   SpO2: 99% 100% 99% 99%  Weight:      Height:        Intake/Output  Summary (Last 24 hours) at 05/17/2017 1332 Last data filed at 05/17/2017 1023 Gross per 24 hour  Intake 1359.25 ml  Output 2185 ml  Net -825.75 ml   Filed Weights   05/13/17 1034 05/13/17 1719 05/15/17 0255  Weight: 50.3 kg (111 lb) 50.3 kg (110 lb 14.3 oz) 52.6 kg (115 lb 15.4 oz)    Examination:  Gen: cachectic frail gentleman, appears much older than stated age, no distress HEENT: PERRLA, Neck supple, no JVD Lungs: Good air movement bilaterally, CTAB CVS: RRR,No Gallops,Rubs or new Murmurs Abd: soft, mildly distended, mild diffuse tenderness, dressing noted, and colostomy with brown stool Extremities: No Cyanosis, Clubbing or edema Skin: no new rashes  Data Reviewed: I have personally reviewed following labs and imaging studies  CBC: Recent Labs  Lab 05/12/17 0424 05/13/17 0412  05/13/17 2325 05/14/17 0522 05/15/17 0354 05/16/17 0000  WBC 10.6* 11.3* 19.0* 17.2* 13.9* 12.8*  NEUTROABS 6.7 7.1  --  14.1* 10.7*  --   HGB 10.3* 10.1* 11.9* 10.4* 8.7* 8.3*  HCT 32.4* 31.9* 37.5* 33.6* 28.0* 26.5*  MCV 94.5 94.4 96.2 94.9 96.6 95.7  PLT 509* 524* 501* 494* 494* 622*   Basic Metabolic Panel: Recent Labs  Lab 05/13/17 0412 05/14/17 0522 05/15/17 0354 05/16/17 0452 05/17/17 0407  NA 138 139 139 138 135  K 3.5 3.9 3.6 3.5 3.9  CL 101 104 107 106 106  CO2 27 25 24 23 23   GLUCOSE 95 110* 86 124* 109*  BUN 25* 19 14 9 11   CREATININE 1.09 1.29* 1.14 0.86 0.76  CALCIUM 8.9 8.4* 8.4* 8.2* 8.8*  MG 1.8 1.7 1.7 1.8 1.7  PHOS  --   --  4.8* 2.8 4.5   GFR: Estimated Creatinine Clearance: 78.5 mL/min (by C-G formula based on SCr of 0.76 mg/dL). Liver Function Tests: Recent Labs  Lab 05/15/17 0354  AST 14*  ALT 9*  ALKPHOS 71  BILITOT 0.3  PROT 6.1*  ALBUMIN 2.0*   No results for input(s): LIPASE, AMYLASE in the last 168 hours. No results for input(s): AMMONIA in the last 168 hours. Coagulation Profile: No results for input(s): INR, PROTIME in the last 168 hours. Cardiac Enzymes: No results for input(s): CKTOTAL, CKMB, CKMBINDEX, TROPONINI in the last 168 hours. BNP (last 3 results) No results for input(s): PROBNP in the last 8760 hours. HbA1C: No results for input(s): HGBA1C in the last 72 hours. CBG: Recent Labs  Lab 05/16/17 0613 05/16/17 1201 05/16/17 1735 05/17/17 0007 05/17/17 1154  GLUCAP 125* 156* 109* 115* 116*   Lipid Profile: Recent Labs    05/15/17 0354  TRIG 155*   Thyroid Function Tests: No results for input(s): TSH, T4TOTAL, FREET4, T3FREE, THYROIDAB in the last 72 hours. Anemia Panel: No results for input(s): VITAMINB12, FOLATE, FERRITIN, TIBC, IRON, RETICCTPCT in the last 72 hours. Sepsis Labs: No results for input(s): PROCALCITON, LATICACIDVEN in the last 168 hours.  Recent Results (from the past 240 hour(s))  Gram  stain     Status: None   Collection Time: 05/08/17  3:15 PM  Result Value Ref Range Status   Specimen Description PLEURAL  Final   Special Requests RIGHT  Final   Gram Stain   Final    WBC PRESENT, PREDOMINANTLY MONONUCLEAR NO ORGANISMS SEEN CYTOSPIN SMEAR    Report Status 05/09/2017 FINAL  Final  Culture, body fluid-bottle     Status: None   Collection Time: 05/08/17  3:15 PM  Result Value Ref Range Status   Specimen  Description PLEURAL  Final   Special Requests RIGHT  Final   Culture NO GROWTH 5 DAYS  Final   Report Status 05/13/2017 FINAL  Final  Surgical pcr screen     Status: None   Collection Time: 05/13/17  4:41 AM  Result Value Ref Range Status   MRSA, PCR NEGATIVE NEGATIVE Final   Staphylococcus aureus NEGATIVE NEGATIVE Final    Comment: (NOTE) The Xpert SA Assay (FDA approved for NASAL specimens in patients 52 years of age and older), is one component of a comprehensive surveillance program. It is not intended to diagnose infection nor to guide or monitor treatment.          Radiology Studies: No results found.      Scheduled Meds: . acetaminophen  650 mg Oral Q6H  . enoxaparin (LOVENOX) injection  40 mg Subcutaneous Q24H  . feeding supplement (ENSURE ENLIVE)  237 mL Oral BID BM  . hyoscyamine  0.125 mg Sublingual Once  . insulin aspart  0-9 Units Subcutaneous Q6H  . mouth rinse  15 mL Mouth Rinse BID  . pantoprazole (PROTONIX) IV  40 mg Intravenous Q24H   Continuous Infusions: . methocarbamol (ROBAXIN)  IV Stopped (05/17/17 0735)  . piperacillin-tazobactam (ZOSYN)  IV 3.375 g (05/17/17 1024)  . TPN ADULT (ION) 50 mL/hr at 05/16/17 1712  . TPN ADULT (ION)       LOS: 36 days        Domenic Polite, MD [facility] Page via [email or web address] password [email or web address]  If 7PM-7AM, please contact night-coverage [email or web address] Password [email or web address] 05/17/2017, 1:32 PM

## 2017-05-17 NOTE — Progress Notes (Signed)
4 Days Post-Op   Subjective/Chief Complaint: Feels better, no n/v, tol liquids   Objective: Vital signs in last 24 hours: Temp:  [98.2 F (36.8 C)-98.4 F (36.9 C)] 98.2 F (36.8 C) (12/02 0456) Pulse Rate:  [85-91] 85 (12/02 0456) Resp:  [15-17] 16 (12/02 0456) BP: (132-147)/(82-88) 139/88 (12/02 0456) SpO2:  [99 %-100 %] 99 % (12/02 0456) Last BM Date: 05/16/17  Intake/Output from previous day: 12/01 0701 - 12/02 0700 In: 1359.3 [P.O.:110; I.V.:625.9; IV Piggyback:623.3] Out: 1770 [Urine:1450; Drains:20; GGEZM:629] Intake/Output this shift: No intake/output data recorded.  GI: soft wound clean drains serous stoma pink and functional  Lab Results:  Recent Labs    05/15/17 0354 05/16/17 0000  WBC 13.9* 12.8*  HGB 8.7* 8.3*  HCT 28.0* 26.5*  PLT 494* 507*   BMET Recent Labs    05/16/17 0452 05/17/17 0407  NA 138 135  K 3.5 3.9  CL 106 106  CO2 23 23  GLUCOSE 124* 109*  BUN 9 11  CREATININE 0.86 0.76  CALCIUM 8.2* 8.8*   PT/INR No results for input(s): LABPROT, INR in the last 72 hours. ABG No results for input(s): PHART, HCO3 in the last 72 hours.  Invalid input(s): PCO2, PO2  Studies/Results: No results found.  Anti-infectives: Anti-infectives (From admission, onward)   Start     Dose/Rate Route Frequency Ordered Stop   05/08/17 1000  fluconazole (DIFLUCAN) tablet 100 mg     100 mg Oral Daily 05/08/17 0603 05/12/17 0817   05/04/17 0130  piperacillin-tazobactam (ZOSYN) IVPB 3.375 g     3.375 g 12.5 mL/hr over 240 Minutes Intravenous Every 8 hours 05/03/17 2211     04/28/17 1530  vancomycin (VANCOCIN) IVPB 750 mg/150 ml premix  Status:  Discontinued     750 mg 150 mL/hr over 60 Minutes Intravenous Every 24 hours 04/28/17 1424 05/01/17 1128   04/27/17 1400  piperacillin-tazobactam (ZOSYN) IVPB 3.375 g  Status:  Discontinued     3.375 g 12.5 mL/hr over 240 Minutes Intravenous Every 8 hours 04/27/17 1158 05/03/17 2211   04/26/17 1830  vancomycin  (VANCOCIN) 1,250 mg in sodium chloride 0.9 % 250 mL IVPB  Status:  Discontinued     1,250 mg 166.7 mL/hr over 90 Minutes Intravenous Every 48 hours 04/26/17 1739 04/28/17 1424   04/24/17 1700  vancomycin (VANCOCIN) IVPB 1000 mg/200 mL premix  Status:  Discontinued     1,000 mg 200 mL/hr over 60 Minutes Intravenous  Once 04/24/17 1652 04/24/17 1656   04/24/17 1700  vancomycin (VANCOCIN) IVPB 1000 mg/200 mL premix  Status:  Discontinued     1,000 mg 200 mL/hr over 60 Minutes Intravenous Every 48 hours 04/24/17 1656 04/26/17 1739   04/23/17 1500  piperacillin-tazobactam (ZOSYN) IVPB 3.375 g  Status:  Discontinued     3.375 g 12.5 mL/hr over 240 Minutes Intravenous Every 12 hours 04/23/17 1107 04/27/17 1158   04/18/17 1800  piperacillin-tazobactam (ZOSYN) IVPB 3.375 g  Status:  Discontinued     3.375 g 12.5 mL/hr over 240 Minutes Intravenous Every 12 hours 04/18/17 1032 04/23/17 1107   04/18/17 1400  piperacillin-tazobactam (ZOSYN) IVPB 3.375 g  Status:  Discontinued     3.375 g 12.5 mL/hr over 240 Minutes Intravenous Every 8 hours 04/18/17 1031 04/18/17 1032   04/17/17 1200  piperacillin-tazobactam (ZOSYN) IVPB 3.375 g  Status:  Discontinued     3.375 g 100 mL/hr over 30 Minutes Intravenous Every 6 hours 04/17/17 1030 04/18/17 1031   04/17/17 1100  piperacillin-tazobactam (ZOSYN) IVPB 3.375 g  Status:  Discontinued     3.375 g 12.5 mL/hr over 240 Minutes Intravenous Every 8 hours 04/17/17 1019 04/17/17 1030   04/15/17 2125  Ampicillin-Sulbactam (UNASYN) 3 g in sodium chloride 0.9 % 100 mL IVPB  Status:  Discontinued     3 g 200 mL/hr over 30 Minutes Intravenous Every 8 hours 04/15/17 1529 04/17/17 1019   04/15/17 1100  ampicillin-sulbactam (UNASYN) 1.5 g in sodium chloride 0.9 % 50 mL IVPB  Status:  Discontinued     1.5 g 100 mL/hr over 30 Minutes Intravenous Every 12 hours 04/15/17 0937 04/15/17 1529   04/13/17 2200  levofloxacin (LEVAQUIN) IVPB 500 mg  Status:  Discontinued     500  mg 100 mL/hr over 60 Minutes Intravenous Every 48 hours 04/12/17 0114 04/12/17 0846   04/13/17 1800  piperacillin-tazobactam (ZOSYN) IVPB 2.25 g  Status:  Discontinued     2.25 g 100 mL/hr over 30 Minutes Intravenous Every 8 hours 04/13/17 1050 04/15/17 0937   04/12/17 0200  piperacillin-tazobactam (ZOSYN) IVPB 3.375 g  Status:  Discontinued     3.375 g 12.5 mL/hr over 240 Minutes Intravenous Every 8 hours 04/12/17 0103 04/13/17 1050   04/12/17 0000  levofloxacin (LEVAQUIN) IVPB 750 mg  Status:  Discontinued     750 mg 100 mL/hr over 90 Minutes Intravenous Every 48 hours 04/11/17 2230 04/12/17 0114      Assessment/Plan: POD 4 elap with colostomy Acute diverticulitis with perforation. -Randolphadm10/18/18 diverticulitis with drain placement  -Transferred due to respiratory failure10/27/2018. -Earlier hospital course, complicated by DTs, acute respiratory failure with hypoxia, ARDS, aspiration pneumonia and circulatory shock which required intubation and pressors. Also ARF requiring dialysis.Lungs are improving. Colocutaneous fistula - S/PSigmoid colectomy, end colostomy11/28/18 Dr. Rolm Bookbinder  - afebrile,VSS - BID wet to dry to midline incision - OOB/mobilize and use IS - PT/OT pleural effusions -bilateral thoracentesis11/23. ZOX:WRUEAV can advance to fulls but does not want to yet, will add ensure TPN, will half tomorrow if does well ID: IV zosyn (11/2=>>day29) Plan to continue IV abx for a total of 7 days post-op DVT: SQ heparin- change to lovenox with nl renal fxn Follow up: Dr. Weber Cooks 05/17/2017

## 2017-05-18 LAB — COMPREHENSIVE METABOLIC PANEL
ALBUMIN: 1.9 g/dL — AB (ref 3.5–5.0)
ALK PHOS: 91 U/L (ref 38–126)
ALT: 10 U/L — ABNORMAL LOW (ref 17–63)
AST: 16 U/L (ref 15–41)
Anion gap: 8 (ref 5–15)
BILIRUBIN TOTAL: 0.4 mg/dL (ref 0.3–1.2)
BUN: 17 mg/dL (ref 6–20)
CALCIUM: 8.9 mg/dL (ref 8.9–10.3)
CO2: 22 mmol/L (ref 22–32)
Chloride: 106 mmol/L (ref 101–111)
Creatinine, Ser: 0.71 mg/dL (ref 0.61–1.24)
GFR calc Af Amer: >60 mL/min (ref >60)
GFR calc non Af Amer: >60 mL/min (ref >60)
GLUCOSE: 116 mg/dL — AB (ref 65–99)
POTASSIUM: 4.7 mmol/L (ref 3.5–5.1)
Sodium: 136 mmol/L (ref 135–145)
TOTAL PROTEIN: 6.1 g/dL — AB (ref 6.5–8.1)

## 2017-05-18 LAB — DIFFERENTIAL
Basophils Absolute: 0.1 10*3/uL (ref 0.0–0.1)
Basophils Relative: 1 %
EOS PCT: 5 %
Eosinophils Absolute: 0.5 10*3/uL (ref 0.0–0.7)
LYMPHS PCT: 23 %
Lymphs Abs: 2.4 10*3/uL (ref 0.7–4.0)
MONO ABS: 0.6 10*3/uL (ref 0.1–1.0)
MONOS PCT: 6 %
Neutro Abs: 6.7 10*3/uL (ref 1.7–7.7)
Neutrophils Relative %: 65 %

## 2017-05-18 LAB — GLUCOSE, CAPILLARY
GLUCOSE-CAPILLARY: 71 mg/dL (ref 65–99)
GLUCOSE-CAPILLARY: 103 mg/dL — AB (ref 65–99)
Glucose-Capillary: 113 mg/dL — ABNORMAL HIGH (ref 65–99)
Glucose-Capillary: 119 mg/dL — ABNORMAL HIGH (ref 65–99)

## 2017-05-18 LAB — CBC
HEMATOCRIT: 27.8 % — AB (ref 39.0–52.0)
Hemoglobin: 8.9 g/dL — ABNORMAL LOW (ref 13.0–17.0)
MCH: 29.9 pg (ref 26.0–34.0)
MCHC: 32 g/dL (ref 30.0–36.0)
MCV: 93.3 fL (ref 78.0–100.0)
PLATELETS: 546 10*3/uL — AB (ref 150–400)
RBC: 2.98 MIL/uL — AB (ref 4.22–5.81)
RDW: 15.7 % — ABNORMAL HIGH (ref 11.5–15.5)
WBC: 10.2 10*3/uL (ref 4.0–10.5)

## 2017-05-18 LAB — PHOSPHORUS: Phosphorus: 5 mg/dL — ABNORMAL HIGH (ref 2.5–4.6)

## 2017-05-18 LAB — MAGNESIUM: Magnesium: 2.2 mg/dL (ref 1.7–2.4)

## 2017-05-18 LAB — PREALBUMIN: PREALBUMIN: 24.6 mg/dL (ref 18–38)

## 2017-05-18 LAB — TRIGLYCERIDES: Triglycerides: 149 mg/dL (ref <150)

## 2017-05-18 MED ORDER — TRACE MINERALS CR-CU-MN-SE-ZN 10-1000-500-60 MCG/ML IV SOLN
INTRAVENOUS | Status: AC
  Administered 2017-05-18: 17:00:00 via INTRAVENOUS
  Filled 2017-05-18: qty 1094.4

## 2017-05-18 MED ORDER — PANTOPRAZOLE SODIUM 40 MG PO TBEC
40.0000 mg | DELAYED_RELEASE_TABLET | Freq: Every day | ORAL | Status: DC
  Administered 2017-05-18 – 2017-05-19 (×2): 40 mg via ORAL
  Filled 2017-05-18 (×2): qty 1

## 2017-05-18 MED ORDER — HYOSCYAMINE SULFATE 0.125 MG SL SUBL
0.1250 mg | SUBLINGUAL_TABLET | Freq: Four times a day (QID) | SUBLINGUAL | Status: DC | PRN
  Administered 2017-05-19: 0.125 mg via SUBLINGUAL
  Filled 2017-05-18 (×2): qty 1

## 2017-05-18 NOTE — Progress Notes (Signed)
Inpatient Rehabilitation  Met with patient at bedside today.  Discussed progress over the weekend, given Min A on Friday.  He reported and RN confirmed that walked Supervision with walker and that he only required assist with his IV pole.  As a result, patient too high level to justify an IP Rehab stay.  Discussed with patient, nurse, and nurse case manager.  Will sign off at this time.  Call if questions.   Carmelia Roller., CCC/SLP Admission Coordinator  [place]  Cell [phone]

## 2017-05-18 NOTE — Progress Notes (Signed)
Physical Therapy Treatment Patient Details Name: Dominic Phillips MRN: [id number] DOB: April 20, 1961 Today's Date: 05/18/2017    History of Present Illness Pt admitted to [facility] and transferred on 04/02/17 with c/o abdominal pain.  Pt presented with ETOH withdrawls, liver abscess and SBO on TPN.  10/23 presents with hypoxia and increased WBC count, 10/25 intubated, 10/26 scans show PNA and fluid overload, patient extubated on 04/17/17.  Chart review shows COPD, VDRF, anemia, AKI and hypokalemia in addition to previous listed statements.  PMH: ETOH abuse.  11/8 R thoracentesis.  05/13/17 pt underwent sigmoid colectomy with end colostomy.    PT Comments    Patient received supine in bed with HOB elevated and pleasant, willing to work with skilled PT services, but does express some concern over his bowel pain with it being so soon after eating today, reports he will try with skilled PT services but isn't sure how much he will be able to tolerate. Patient very pleasant and very talkative today, limiting interventions performed this session. Able to perform functional bridges in the bed today, and performed functional mobility generally with S to min guard for most activities; ambulated in room with IV pole and patient independent in selfcare in bathroom today. Patient initially agreeable to focus on strength/balance training, as he is already walking quite a far distance with mobility tech on the unit, however upon return to EOB/with PT preparing strength/balance exercise set, patient then requested return to bed due to pain/reported he felt unable to continue with PT this session due to pain. Patient left in bed with all needs met and concerns/questions addressed at this time.     Follow Up Recommendations  CIR     Equipment Recommendations  Rolling walker with 5" wheels;3in1 (PT)    Recommendations for Other Services       Precautions / Restrictions Precautions Precautions: Fall;Other  (comment) Precaution Comments: drains, colostomy bag Restrictions Weight Bearing Restrictions: No    Mobility  Bed Mobility Overal bed mobility: Needs Assistance Bed Mobility: Supine to Sit;Sit to Supine     Supine to sit: Supervision Sit to supine: Min assist   General bed mobility comments: due to pain   Transfers Overall transfer level: Needs assistance Equipment used: (pushed IV pole ) Transfers: Sit to/from Stand Sit to Stand: Supervision         General transfer comment: S for line management, improved ease of transfers and balance noted   Ambulation/Gait Ambulation/Gait assistance: Supervision Ambulation Distance (Feet): 20 Feet Assistive device: None(IV pole in room) Gait Pattern/deviations: Step-through pattern;Decreased stride length;Trunk flexed     General Gait Details: verbal cues for posture, distance limited by pain   Stairs            Wheelchair Mobility    Modified Rankin (Stroke Patients Only)       Balance                                            Cognition Arousal/Alertness: Awake/alert Behavior During Therapy: WFL for tasks assessed/performed Overall Cognitive Status: Within Functional Limits for tasks assessed                           Safety/Judgement: Decreased awareness of safety     General Comments: patient very talkative today, ongoing reduced awareness of impairments  Exercises      General Comments        Pertinent Vitals/Pain Pain Assessment: 0-10 Pain Score: 10-Worst pain ever Pain Location: abdomen Pain Descriptors / Indicators: Grimacing;Guarding Pain Intervention(s): Monitored during session;Limited activity within patient's tolerance    Home Living                      Prior Function            PT Goals (current goals can now be found in the care plan section) Acute Rehab PT Goals Patient Stated Goal: wants to get back home and back to independence  to care for his son PT Goal Formulation: With patient Time For Goal Achievement: 05/29/17 Potential to Achieve Goals: Good Progress towards PT goals: Progressing toward goals(limited today by pain and malaise after finishing meal however appears to be grossly progressing based on chart reveiw and observation of mobility today )    Frequency    Min 3X/week      PT Plan Current plan remains appropriate    Co-evaluation              AM-PAC PT "6 Clicks" Daily Activity  Outcome Measure  Difficulty turning over in bed (including adjusting bedclothes, sheets and blankets)?: None Difficulty moving from lying on back to sitting on the side of the bed? : None Difficulty sitting down on and standing up from a chair with arms (e.g., wheelchair, bedside commode, etc,.)?: None Help needed moving to and from a bed to chair (including a wheelchair)?: A Little Help needed walking in hospital room?: A Little Help needed climbing 3-5 steps with a railing? : A Lot 6 Click Score: 20    End of Session   Activity Tolerance: Patient limited by pain Patient left: in bed;with call bell/phone within reach   PT Visit Diagnosis: Unsteadiness on feet (R26.81);Muscle weakness (generalized) (M62.81);Other abnormalities of gait and mobility (R26.89)     Time: 2761-4709 PT Time Calculation (min) (ACUTE ONLY): 29 min  Charges:  $Therapeutic Activity: 23-37 mins                    G Codes:       Deniece Ree PT, DPT, CBIS  Supplemental Physical Therapist [place]

## 2017-05-18 NOTE — Progress Notes (Signed)
[facility]/Trauma Progress Note  5 Days Post-Op   Assessment/Plan Hx of tobacco use Hx of alcohol abuse Anemia- hgb 10.4, hct 33.6, follow Chronic kidney disease  Acute diverticulitis with perforation. -Randolphadm10/18/18 diverticulitis with drain placement  -Transferred due to respiratory failure10/27/2018. -Earlier hospital course, complicated by DTs, acute respiratory failure with hypoxia, ARDS, aspiration pneumonia and circulatory shock which required intubation and pressors. Also ARF requiring dialysis.Lungs are improving. Colocutaneous fistula - S/PSigmoid colectomy, end colostomy11/28/18 Dr. Rolm Bookbinder  - afebrile,VSS, VOZ36.6 - BID wet to dry to midline incision - OOB/mobilize and use IS - PT/OT pleural effusions -bilateral thoracentesis11/23 Loculated left-sided ascites.600 mL aspirated. Cultures negative.   YQI:HKVQ diet, TPN QV:ZDGL stopped 11/16, IV zosyn (11/18=>>) Plan to continue IV abx for a total of 7 days post-op DVT: SCD's, lovenox Foley: D/C11/29 Follow up: Dr. Rolm Bookbinder  Plan: soft diet, mobilize, IS, continue TPN for now    LOS: 37 days    Subjective:  CC: abdominal pain  Pt tolerating clears without nausea or vomiting. Good output in ostomy. Pt had some mucus from his rectum this am. No blood. Abdominal pain is mild.   Objective: Vital signs in last 24 hours: Temp:  [98.2 F (36.8 C)-98.3 F (36.8 C)] 98.3 F (36.8 C) (12/03 0545) Pulse Rate:  [85-86] 86 (12/03 0545) Resp:  [18] 18 (12/03 0545) BP: (106-122)/(56-76) 106/56 (12/03 0545) SpO2:  [99 %-100 %] 100 % (12/03 0545) Weight:  [120 lb 9.5 oz (54.7 kg)] 120 lb 9.5 oz (54.7 kg) (12/03 0500) Last BM Date: 06/14/17  Intake/Output from previous day: 12/02 0701 - 12/03 0700 In: 2827 [P.O.:840; I.V.:1607; IV Piggyback:380] Out: 2310 [Urine:1700; Drains:35; Stool:575] Intake/Output this shift: No intake/output data  recorded.  PE: Gen: Alert, NAD, cooperative Card: Regular rate and rhythm Pulm: Normal effort and rate Abd: Soft,+BS, not distended, appropriately tender, midline incisiongranulating nicely - no drainage or surrounding erythema, LLQ stoma viable with liquid brown stool in bag. JP drains are serosanguinous Extremities: PICC in RUQ Skin: warm and dry, no rashes  Psych: A&Ox3   Anti-infectives: Anti-infectives (From admission, onward)   Start     Dose/Rate Route Frequency Ordered Stop   05/08/17 1000  fluconazole (DIFLUCAN) tablet 100 mg     100 mg Oral Daily 05/08/17 0603 05/12/17 0817   05/04/17 0130  piperacillin-tazobactam (ZOSYN) IVPB 3.375 g     3.375 g 12.5 mL/hr over 240 Minutes Intravenous Every 8 hours 05/03/17 2211     04/28/17 1530  vancomycin (VANCOCIN) IVPB 750 mg/150 ml premix  Status:  Discontinued     750 mg 150 mL/hr over 60 Minutes Intravenous Every 24 hours 04/28/17 1424 05/01/17 1128   04/27/17 1400  piperacillin-tazobactam (ZOSYN) IVPB 3.375 g  Status:  Discontinued     3.375 g 12.5 mL/hr over 240 Minutes Intravenous Every 8 hours 04/27/17 1158 05/03/17 2211   04/26/17 1830  vancomycin (VANCOCIN) 1,250 mg in sodium chloride 0.9 % 250 mL IVPB  Status:  Discontinued     1,250 mg 166.7 mL/hr over 90 Minutes Intravenous Every 48 hours 04/26/17 1739 04/28/17 1424   04/24/17 1700  vancomycin (VANCOCIN) IVPB 1000 mg/200 mL premix  Status:  Discontinued     1,000 mg 200 mL/hr over 60 Minutes Intravenous  Once 04/24/17 1652 04/24/17 1656   04/24/17 1700  vancomycin (VANCOCIN) IVPB 1000 mg/200 mL premix  Status:  Discontinued     1,000 mg 200 mL/hr over 60 Minutes Intravenous Every 48 hours 04/24/17 1656 04/26/17  1739   04/23/17 1500  piperacillin-tazobactam (ZOSYN) IVPB 3.375 g  Status:  Discontinued     3.375 g 12.5 mL/hr over 240 Minutes Intravenous Every 12 hours 04/23/17 1107 04/27/17 1158   04/18/17 1800  piperacillin-tazobactam (ZOSYN) IVPB 3.375 g   Status:  Discontinued     3.375 g 12.5 mL/hr over 240 Minutes Intravenous Every 12 hours 04/18/17 1032 04/23/17 1107   04/18/17 1400  piperacillin-tazobactam (ZOSYN) IVPB 3.375 g  Status:  Discontinued     3.375 g 12.5 mL/hr over 240 Minutes Intravenous Every 8 hours 04/18/17 1031 04/18/17 1032   04/17/17 1200  piperacillin-tazobactam (ZOSYN) IVPB 3.375 g  Status:  Discontinued     3.375 g 100 mL/hr over 30 Minutes Intravenous Every 6 hours 04/17/17 1030 04/18/17 1031   04/17/17 1100  piperacillin-tazobactam (ZOSYN) IVPB 3.375 g  Status:  Discontinued     3.375 g 12.5 mL/hr over 240 Minutes Intravenous Every 8 hours 04/17/17 1019 04/17/17 1030   04/15/17 2125  Ampicillin-Sulbactam (UNASYN) 3 g in sodium chloride 0.9 % 100 mL IVPB  Status:  Discontinued     3 g 200 mL/hr over 30 Minutes Intravenous Every 8 hours 04/15/17 1529 04/17/17 1019   04/15/17 1100  ampicillin-sulbactam (UNASYN) 1.5 g in sodium chloride 0.9 % 50 mL IVPB  Status:  Discontinued     1.5 g 100 mL/hr over 30 Minutes Intravenous Every 12 hours 04/15/17 0937 04/15/17 1529   04/13/17 2200  levofloxacin (LEVAQUIN) IVPB 500 mg  Status:  Discontinued     500 mg 100 mL/hr over 60 Minutes Intravenous Every 48 hours 04/12/17 0114 04/12/17 0846   04/13/17 1800  piperacillin-tazobactam (ZOSYN) IVPB 2.25 g  Status:  Discontinued     2.25 g 100 mL/hr over 30 Minutes Intravenous Every 8 hours 04/13/17 1050 04/15/17 0937   04/12/17 0200  piperacillin-tazobactam (ZOSYN) IVPB 3.375 g  Status:  Discontinued     3.375 g 12.5 mL/hr over 240 Minutes Intravenous Every 8 hours 04/12/17 0103 04/13/17 1050   04/12/17 0000  levofloxacin (LEVAQUIN) IVPB 750 mg  Status:  Discontinued     750 mg 100 mL/hr over 90 Minutes Intravenous Every 48 hours 04/11/17 2230 04/12/17 0114      Lab Results:  Recent Labs    05/16/17 0000 05/18/17 0349  WBC 12.8* 10.2  HGB 8.3* 8.9*  HCT 26.5* 27.8*  PLT 507* 546*   BMET Recent Labs     05/17/17 0407 05/18/17 0349  NA 135 136  K 3.9 4.7  CL 106 106  CO2 23 22  GLUCOSE 109* 116*  BUN 11 17  CREATININE 0.76 0.71  CALCIUM 8.8* 8.9   PT/INR No results for input(s): LABPROT, INR in the last 72 hours. CMP     Component Value Date/Time   NA 136 05/18/2017 0349   K 4.7 05/18/2017 0349   CL 106 05/18/2017 0349   CO2 22 05/18/2017 0349   GLUCOSE 116 (H) 05/18/2017 0349   BUN 17 05/18/2017 0349   CREATININE 0.71 05/18/2017 0349   CALCIUM 8.9 05/18/2017 0349   PROT 6.1 (L) 05/18/2017 0349   ALBUMIN 1.9 (L) 05/18/2017 0349   AST 16 05/18/2017 0349   ALT 10 (L) 05/18/2017 0349   ALKPHOS 91 05/18/2017 0349   BILITOT 0.4 05/18/2017 0349   GFRNONAA >60 05/18/2017 0349   GFRAA >60 05/18/2017 0349   Lipase  No results found for: LIPASE  Studies/Results: No results found.    Kalman Drape ,  [facility] 05/18/2017, 8:31 AM Pager: [phone] Consults: [phone] Mon-Fri 7:00 am-4:30 pm Sat-Sun 7:00 am-11:30 am

## 2017-05-18 NOTE — Progress Notes (Signed)
Nutrition Follow-up  DOCUMENTATION CODES:   Non-severe (moderate) malnutrition in context of chronic illness  INTERVENTION:   -TPN management per pharmacy -Continue Ensure Enlive po BID, each supplement provides 350 kcal and 20 grams of protein  NUTRITION DIAGNOSIS:   Moderate Malnutrition related to chronic illness(ETOH abuse) as evidenced by mild fat depletion, mild muscle depletion.  Ongoing   GOAL:   Patient will meet greater than or equal to 90% of their needs  Progressing  MONITOR:   Diet advancement, Labs, Weight trends, Skin, I & O's  REASON FOR ASSESSMENT:   Consult New TPN/TNA  ASSESSMENT:   56 year old male with PMH of ETOH abuse who was admitted to [facility] on 10/18 with diverticular abscess; hospital course complicated by DT's, development of a SBO, and PNA; required intubation on 10/25. He was transferred to [facility] on 10/27 with worsening renal failure, ARDS, aspiration PNA.  11/7- reviewed surgery note; plan to control leak and continue antibiotics; potential surgery in 6-8 weeks 11/8- s/p rt thoracentesis (1.3 L removed) 11/10- SLP evaluation- continue regular consistency diet with thin liquids, IJ cath removed 11/18- transferred from SDU to medical floor 11/20- s/p lt paracentesis, 600 ml fluid removed 11/21- per surgery note, plan to d/c condom cath and rectal tube, check c-diff 11/23 -bilateral thoracentesis removal of 800 mL from left and 1100 mL from right 11/27 - cleared for surgery tomorrow for perf - hartmanns per Dr. Donne Hazel 11/28- s/p procedure: Sigmoid colectomy, end colostomy 11/29- NGT removed  Pt in good spirits at time of visit, setting up lunch tray. Noted pt consumed about 50% of Ensure supplement. Meal completion variable (PO: 0-40%).   Pt also acknowledged plan to continue TPN for nutritional support. Per pharmacy note, plan to continue TPN at goal rate 80 ml/hr x 1 more day, as surgical PA is concerned about pt's  malnutrition. TPN provides 2016 kCal and 109 grams of protein per day,meeting100% of ptneeds.   Labs reviewed: CBGS: 103-119  Diet Order:  TPN ADULT (ION) DIET SOFT Room service appropriate? Yes; Fluid consistency: Thin TPN ADULT (ION)  EDUCATION NEEDS:   Education needs have been addressed  Skin:  Skin Assessment: Skin Integrity Issues: Skin Integrity Issues:: Incisions Stage I: epithelialized Stage II: epithelialized Incisions: closed abdomen  Last BM:  05/18/17 (150 ml output via colostomy)  Height:   Ht Readings from Last 1 Encounters:  05/13/17 5\' 11"  (1.803 m)    Weight:   Wt Readings from Last 1 Encounters:  05/18/17 120 lb 9.5 oz (54.7 kg)    Ideal Body Weight:  78.2 kg  BMI:  Body mass index is 16.82 kg/m.  Estimated Nutritional Needs:   Kcal:  2000-2200  Protein:  100-115 grams  Fluid:  2-2.2 L    Arthi Mcdonald A. Jimmye Norman, RD, LDN, CDE Pager: [phone] After hours Pager: [phone]

## 2017-05-18 NOTE — Progress Notes (Signed)
Patient ID: Dominic Phillips, male   DOB: 08/19/1960, 56 y.o.   MRN: [id number]  PROGRESS NOTE    Dominic Phillips  JHE:[id number] DOB: 11/30/1960 DOA: 04/11/2017 PCP: No primary care provider on file.   Brief Narrative:  56 year old male with past medical history of alcohol abuse presented to [facility] on 04/02/2017 with abdominal pain without fevers or leukocytosis. CT abdomen revealed pericolonic inflammation/fluid collection and he was admitted for diverticular abscess. General surgery was consulted who suggested placement of percutaneous drain by IR. Cultures grew Escherichia coli. Hospital course complicated by DTs, acute respiratory failure with hypoxia/ARDS/aspiration pneumonia and circulatory shock for which he required intubation and pressors; acute kidney injury for which she required CRRT and hemodialysis 1. Shock has resolved. Currently extubated and on room air. Renal function is improving. Nephrology signed off. Then underwent sigmoid colectomy and end ostomy on 11/28 by Dr. Donne Hazel Now with post op ileus , TNA started 11/30  Assessment & Plan:   Acute hypoxic respiratory failure/ARDS with bilateral pleural effusions - Multifactorial second to pulmonary edema versus pneumonia/atelectasis - resolved - Currently on room air. Pulmonary following. - Required intubation from 04/11/2017-04/17/17 - Status post thoracentesis on 04/23/2017 with removal of 1.3 L fluid and bilateral thoracentesis on 05/08/2017 with removal of 800 mL from left and 1100 mL from right - stable now, resolved, FU CXR in 1 week  Diverticulitis with perforation and abscess and Colocutaneous fistula - General surgery following. Remains on IV Zosyn.  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 - no improvement with nonoperative management finally and that ended up with sigmoid colectomy and end ostomy on 11/28 by Dr. Donne Hazel -Now with  post op ileus, no return of bowel function yet, started TNA 11/30 -Per Dr.Wakefield continue Abx 7days port op, POD 4 now -diet being advanced to Full liq per [facility], having stool in stoma -wean off TNA as he tolerates a diet  Hypokalemia -Improved, replace  Acute kidney injury status post CRRT on 04/11/2017 and intermittent dialysis -due to sepsis, resolved -Renal function improved and stable. Nephrology has signed off -Follow renal function intermittently -HD catheter removed on 04/25/2017  Anemia of chronic disease - Status post 1 unit of packed red cells transfusion -Continue iron replacement -Monitor hemoglobin -No signs of acute bleeding  Thrombocytosis - probably reactive; monitor  Moderate malnutrition of chronic illness -Follow nutrition recommendations  DVT prophylaxis: Heparin Code Status:  Partial with no prolonged mechanical ventilation and intubation Family Communication: no family at bedside Disposition Plan: CIR vs  SNF when improved, ? 48hours  Consultants:  General surgery IR PCCM   Procedures: IR drain in [facility] on 04/02/2017 CRRT Hemodialysis 1 Removal of HD dialysis catheter Intubation and extubation from 04/11/2017-04/17/17 Thoracentesis 2  Antimicrobials:  Anti-infectives (From admission, onward)   Start     Dose/Rate Route Frequency Ordered Stop   05/08/17 1000  fluconazole (DIFLUCAN) tablet 100 mg     100 mg Oral Daily 05/08/17 0603 05/12/17 0817   05/04/17 0130  piperacillin-tazobactam (ZOSYN) IVPB 3.375 g     3.375 g 12.5 mL/hr over 240 Minutes Intravenous Every 8 hours 05/03/17 2211     04/28/17 1530  vancomycin (VANCOCIN) IVPB 750 mg/150 ml premix  Status:  Discontinued     750 mg 150 mL/hr over 60 Minutes Intravenous Every 24 hours 04/28/17 1424 05/01/17 1128   04/27/17 1400  piperacillin-tazobactam (ZOSYN) IVPB 3.375 g  Status:  Discontinued  3.375 g 12.5 mL/hr over 240 Minutes Intravenous Every 8 hours 04/27/17 1158  05/03/17 2211   04/26/17 1830  vancomycin (VANCOCIN) 1,250 mg in sodium chloride 0.9 % 250 mL IVPB  Status:  Discontinued     1,250 mg 166.7 mL/hr over 90 Minutes Intravenous Every 48 hours 04/26/17 1739 04/28/17 1424   04/24/17 1700  vancomycin (VANCOCIN) IVPB 1000 mg/200 mL premix  Status:  Discontinued     1,000 mg 200 mL/hr over 60 Minutes Intravenous  Once 04/24/17 1652 04/24/17 1656   04/24/17 1700  vancomycin (VANCOCIN) IVPB 1000 mg/200 mL premix  Status:  Discontinued     1,000 mg 200 mL/hr over 60 Minutes Intravenous Every 48 hours 04/24/17 1656 04/26/17 1739   04/23/17 1500  piperacillin-tazobactam (ZOSYN) IVPB 3.375 g  Status:  Discontinued     3.375 g 12.5 mL/hr over 240 Minutes Intravenous Every 12 hours 04/23/17 1107 04/27/17 1158   04/18/17 1800  piperacillin-tazobactam (ZOSYN) IVPB 3.375 g  Status:  Discontinued     3.375 g 12.5 mL/hr over 240 Minutes Intravenous Every 12 hours 04/18/17 1032 04/23/17 1107   04/18/17 1400  piperacillin-tazobactam (ZOSYN) IVPB 3.375 g  Status:  Discontinued     3.375 g 12.5 mL/hr over 240 Minutes Intravenous Every 8 hours 04/18/17 1031 04/18/17 1032   04/17/17 1200  piperacillin-tazobactam (ZOSYN) IVPB 3.375 g  Status:  Discontinued     3.375 g 100 mL/hr over 30 Minutes Intravenous Every 6 hours 04/17/17 1030 04/18/17 1031   04/17/17 1100  piperacillin-tazobactam (ZOSYN) IVPB 3.375 g  Status:  Discontinued     3.375 g 12.5 mL/hr over 240 Minutes Intravenous Every 8 hours 04/17/17 1019 04/17/17 1030   04/15/17 2125  Ampicillin-Sulbactam (UNASYN) 3 g in sodium chloride 0.9 % 100 mL IVPB  Status:  Discontinued     3 g 200 mL/hr over 30 Minutes Intravenous Every 8 hours 04/15/17 1529 04/17/17 1019   04/15/17 1100  ampicillin-sulbactam (UNASYN) 1.5 g in sodium chloride 0.9 % 50 mL IVPB  Status:  Discontinued     1.5 g 100 mL/hr over 30 Minutes Intravenous Every 12 hours 04/15/17 0937 04/15/17 1529   04/13/17 2200  levofloxacin (LEVAQUIN) IVPB  500 mg  Status:  Discontinued     500 mg 100 mL/hr over 60 Minutes Intravenous Every 48 hours 04/12/17 0114 04/12/17 0846   04/13/17 1800  piperacillin-tazobactam (ZOSYN) IVPB 2.25 g  Status:  Discontinued     2.25 g 100 mL/hr over 30 Minutes Intravenous Every 8 hours 04/13/17 1050 04/15/17 0937   04/12/17 0200  piperacillin-tazobactam (ZOSYN) IVPB 3.375 g  Status:  Discontinued     3.375 g 12.5 mL/hr over 240 Minutes Intravenous Every 8 hours 04/12/17 0103 04/13/17 1050   04/12/17 0000  levofloxacin (LEVAQUIN) IVPB 750 mg  Status:  Discontinued     750 mg 100 mL/hr over 90 Minutes Intravenous Every 48 hours 04/11/17 2230 04/12/17 0114       Subjective: -some abd discomfort, no vomiting,. colostomy working ok  Objective: Vitals:   05/17/17 2050 05/18/17 0500 05/18/17 0545 05/18/17 1442  BP: 122/76  (!) 106/56 121/74  Pulse: 85  86 93  Resp: 18  18 18   Temp: 98.2 F (36.8 C)  98.3 F (36.8 C) 98.6 F (37 C)  TempSrc: Oral  Oral Oral  SpO2: 99%  100% 100%  Weight:  54.7 kg (120 lb 9.5 oz)    Height:        Intake/Output Summary (Last  24 hours) at 05/18/2017 1450 Last data filed at 05/18/2017 0545 Gross per 24 hour  Intake 1754.5 ml  Output 1235 ml  Net 519.5 ml   Filed Weights   05/13/17 1719 05/15/17 0255 05/18/17 0500  Weight: 50.3 kg (110 lb 14.3 oz) 52.6 kg (115 lb 15.4 oz) 54.7 kg (120 lb 9.5 oz)    Examination:  Gen: Awake, Alert, Oriented X 3, cachectic frail gentleman, no distress HEENT: PERRLA, Neck supple, no JVD Lungs: Good air movement bilaterally, CTAB CVS: RRR,No Gallops,Rubs or new Murmurs Abd: soft, Non tender, non distended, BS present Extremities: No Cyanosis, Clubbing or edema Skin: no new rashes  Data Reviewed: I have personally reviewed following labs and imaging studies  CBC: Recent Labs  Lab 05/12/17 0424 05/13/17 0412 05/13/17 2325 05/14/17 0522 05/15/17 0354 05/16/17 0000 05/18/17 0349  WBC 10.6* 11.3* 19.0* 17.2* 13.9* 12.8*  10.2  NEUTROABS 6.7 7.1  --  14.1* 10.7*  --  6.7  HGB 10.3* 10.1* 11.9* 10.4* 8.7* 8.3* 8.9*  HCT 32.4* 31.9* 37.5* 33.6* 28.0* 26.5* 27.8*  MCV 94.5 94.4 96.2 94.9 96.6 95.7 93.3  PLT 509* 524* 501* 494* 494* 507* 161*   Basic Metabolic Panel: Recent Labs  Lab 05/14/17 0522 05/15/17 0354 05/16/17 0452 05/17/17 0407 05/18/17 0349  NA 139 139 138 135 136  K 3.9 3.6 3.5 3.9 4.7  CL 104 107 106 106 106  CO2 25 24 23 23 22   GLUCOSE 110* 86 124* 109* 116*  BUN 19 14 9 11 17   CREATININE 1.29* 1.14 0.86 0.76 0.71  CALCIUM 8.4* 8.4* 8.2* 8.8* 8.9  MG 1.7 1.7 1.8 1.7 2.2  PHOS  --  4.8* 2.8 4.5 5.0*   GFR: Estimated Creatinine Clearance: 81.7 mL/min (by C-G formula based on SCr of 0.71 mg/dL). Liver Function Tests: Recent Labs  Lab 05/15/17 0354 05/18/17 0349  AST 14* 16  ALT 9* 10*  ALKPHOS 71 91  BILITOT 0.3 0.4  PROT 6.1* 6.1*  ALBUMIN 2.0* 1.9*   No results for input(s): LIPASE, AMYLASE in the last 168 hours. No results for input(s): AMMONIA in the last 168 hours. Coagulation Profile: No results for input(s): INR, PROTIME in the last 168 hours. Cardiac Enzymes: No results for input(s): CKTOTAL, CKMB, CKMBINDEX, TROPONINI in the last 168 hours. BNP (last 3 results) No results for input(s): PROBNP in the last 8760 hours. HbA1C: No results for input(s): HGBA1C in the last 72 hours. CBG: Recent Labs  Lab 05/17/17 1154 05/17/17 1812 05/18/17 0037 05/18/17 0542 05/18/17 1200  GLUCAP 116* 113* 119* 113* 103*   Lipid Profile: Recent Labs    05/18/17 0349  TRIG 149   Thyroid Function Tests: No results for input(s): TSH, T4TOTAL, FREET4, T3FREE, THYROIDAB in the last 72 hours. Anemia Panel: No results for input(s): VITAMINB12, FOLATE, FERRITIN, TIBC, IRON, RETICCTPCT in the last 72 hours. Sepsis Labs: No results for input(s): PROCALCITON, LATICACIDVEN in the last 168 hours.  Recent Results (from the past 240 hour(s))  Gram stain     Status: None    Collection Time: 05/08/17  3:15 PM  Result Value Ref Range Status   Specimen Description PLEURAL  Final   Special Requests RIGHT  Final   Gram Stain   Final    WBC PRESENT, PREDOMINANTLY MONONUCLEAR NO ORGANISMS SEEN CYTOSPIN SMEAR    Report Status 05/09/2017 FINAL  Final  Culture, body fluid-bottle     Status: None   Collection Time: 05/08/17  3:15 PM  Result  Value Ref Range Status   Specimen Description PLEURAL  Final   Special Requests RIGHT  Final   Culture NO GROWTH 5 DAYS  Final   Report Status 05/13/2017 FINAL  Final  Surgical pcr screen     Status: None   Collection Time: 05/13/17  4:41 AM  Result Value Ref Range Status   MRSA, PCR NEGATIVE NEGATIVE Final   Staphylococcus aureus NEGATIVE NEGATIVE Final    Comment: (NOTE) The Xpert SA Assay (FDA approved for NASAL specimens in patients 73 years of age and older), is one component of a comprehensive surveillance program. It is not intended to diagnose infection nor to guide or monitor treatment.          Radiology Studies: No results found.      Scheduled Meds: . acetaminophen  650 mg Oral Q6H  . enoxaparin (LOVENOX) injection  40 mg Subcutaneous Q24H  . feeding supplement (ENSURE ENLIVE)  237 mL Oral BID BM  . mouth rinse  15 mL Mouth Rinse BID  . pantoprazole  40 mg Oral QHS   Continuous Infusions: . piperacillin-tazobactam (ZOSYN)  IV Stopped (05/18/17 1329)  . TPN ADULT (ION) 80 mL/hr at 05/17/17 1821  . TPN ADULT (ION)       LOS: 37 days        Domenic Polite, MD [facility] Page via [email or web address] password [email or web address]  If 7PM-7AM, please contact night-coverage [email or web address] Password [email or web address] 05/18/2017, 2:50 PM

## 2017-05-18 NOTE — Progress Notes (Signed)
PHARMACY - ADULT TOTAL PARENTERAL NUTRITION CONSULT NOTE   Pharmacy Consult:  TPN Indication:  Prolonged ileus / Colocutaneous fistula  Patient Measurements: Height: 5\' 11"  (180.3 cm) Weight: 120 lb 9.5 oz (54.7 kg) IBW/kg (Calculated) : 75.3 TPN AdjBW (KG): 50.3 Body mass index is 16.82 kg/m.  Assessment:  45 YOM admitted with diverticulitis with perforation and abscess s/p IR drain, transferred from [place] due to respiratory failure. Earlier hospital course complicated by DTs, acute respiratory failure with hypoxia, ARDS, aspiration pneumonia, and circulatory shock requiring intubation and pressors. Also ARF requiring dialysis, now improved and Renal signed off. 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 NGT came out overnight, patient does not want it replaced. Pharmacy consulted to manage TPN for expected prolonged ileus on 05/15/17. Poor PO intake noted for several weeks, RD documented "moderate malnutrition" in assessment 05/12/17.  Diet being advanced to soft, but PA concerned with patient's nutritional status PTA, so thinks patient will benefit from another day of full provision from TPN.   GI: no NG tube per patient request.  Prealbumin WNL at 24.6, drain O/P 67mL, stool O/P 553mL - PPI IV Endo: CBGs trending up post TPN but remain < 180 Insulin requirements in the past 24 hours: 0 units Lytes: CoCa 10.58, Phos 5 (Ca x Phos = 53, goal < 5%), Mag/K trending up Renal: s/p RRT - SCr down 0.71, BUN WNL - UOP 1.3 ml/kg/hr Pulm: COPD - bilateral thoracentesis 11/23 for pleural effusions.  Stable on RA Cards: VSS Hepatobil: LFTs WNL.  TG normalized Neuro: EtOH use - Robaxin, APAP, PRN Dilaudid/oxycodone. Thiamine/folate/multivitamin in TPN. ID: Zosyn D#5/7 post-op for IAI. Cdiff negative. Afebrile, WBC down to 17.2 Best Practices: Lovenox TPN Access: PICC placed 05/14/17 TPN start date: 05/15/17  Nutritional Goals (per RD recommendation on  11/27): 2000-2200 kCal and 100-115 gm protein per day  Current Nutrition:  TPN Full liquid diet (eating up to 40% of meals) >> advance to soft Ensure Enlive BID (received 1 yesterday)   Plan:  Continue TPN at goal rate 80 ml/hr x 1 more day per discussion with PA TPN provides 2016 kCal and 109 gm of protein per day, meeting 100% of patient needs Electrolytes in TPN:  reduce phos / K / Ca / Mag, Cl:Ac 1:1 Daily multivitamin and trace elements in TPN Daily thiamine and folic acid to TPN d/t heavy EtOH use D/C SSI/CBGs checks F/U AM labs, PO intake to wean TPN in AM   Dominic Phillips D. [name], PharmD, BCPS Pager:  [phone] 05/18/2017, 9:32 AM

## 2017-05-18 NOTE — Care Management Note (Addendum)
Case Management Note  Patient Details  Name: Dominic Phillips MRN: [id number] Date of Birth: [birth year]  Subjective/Objective:                    Action/Plan: Ms Annamaria Boots returned call. She is available tomorrow at 2 pm for bedside teaching . She is aware patient will not have home health. She states she has done wet to dry dressing changes and ostomy care in past. Joaquim Lai aware Ms Annamaria Boots will be here tomorrow at 2 pm. Ms Annamaria Boots stated she will call patient shortly . Patient wanting to discharge to home at time of discharge. Patient aware [facility] unable to provide home health RN due to patient's address. Patient also aware he needs to complete paperwork before [place] [facility] will consider providing services.   Per Melissa with CIR, patient's son caregiver Rockie Neighbours cell [phone] or [phone] is willing to learn wound care and ostomy care . She would have to know in advanced when to come to hospital for education so she can arrange care for patient's son. Confirmed same with patient. Patient consulted for NCM to call Ms Annamaria Boots.  Joaquim Lai is patient's bedside nurse today and will be here tomorrow and can provide teaching.   Called Ms Annamaria Boots on both above numbers and left voicemails with my direct call back number. Await call back.   Expected Discharge Date:                  Expected Discharge Plan:  Home/Self Care  In-House Referral:  Clinical Social Work  Discharge planning Services  CM Consult  Post Acute Care Choice:    Choice offered to:  Patient  DME Arranged:    DME Agency:     HH Arranged:    [place] Agency:     Status of Service:  In process, will continue to follow  If discussed at Long Length of Stay Meetings, dates discussed:    Additional Comments:  Marilu Favre, RN 05/18/2017, 11:33 AM

## 2017-05-18 NOTE — Consult Note (Signed)
[place] Nurse ostomy follow up Surgical team following for assessment and plan of care for abd wound Stoma type/location: Colostomy surgery performed on 11/28. Stoma red and viable, above skin level, 1 3/8 inches Peristomal assessment: Intact skin surrounding Output: Large amt brown liquid stool (150cc) in the pouch  Ostomy pouching: 1pc. Education provided:  Pt assisted with changing one piece pouch.  He was able to open and close velcro to empty.  Discussed pouching routines and ordering supplies. Pt asked appropriate questions. 5 extra pouches at the bedside and educational materials in the room.  He denies need for any family members to attend a teaching session.  Pt could benefit from home health after discharge. Enrolled patient in [place] Start Discharge program: Yes Julien Girt MSN, RN, [place], [place], [place]

## 2017-05-19 LAB — PHOSPHORUS: Phosphorus: 4.6 mg/dL (ref 2.5–4.6)

## 2017-05-19 LAB — BASIC METABOLIC PANEL
Anion gap: 8 (ref 5–15)
BUN: 22 mg/dL — AB (ref 6–20)
CALCIUM: 8.9 mg/dL (ref 8.9–10.3)
CO2: 24 mmol/L (ref 22–32)
Chloride: 101 mmol/L (ref 101–111)
Creatinine, Ser: 0.73 mg/dL (ref 0.61–1.24)
GFR calc Af Amer: >60 mL/min (ref >60)
Glucose, Bld: 108 mg/dL — ABNORMAL HIGH (ref 65–99)
POTASSIUM: 4.8 mmol/L (ref 3.5–5.1)
SODIUM: 133 mmol/L — AB (ref 135–145)

## 2017-05-19 LAB — MAGNESIUM: Magnesium: 2.1 mg/dL (ref 1.7–2.4)

## 2017-05-19 MED ORDER — ACETAMINOPHEN 325 MG PO TABS: 650.0000 mg | ORAL_TABLET | Freq: Four times a day (QID) | ORAL | Status: DC

## 2017-05-19 MED ORDER — TRAVASOL 10 % IV SOLN
INTRAVENOUS | Status: DC
  Administered 2017-05-19: 18:00:00 via INTRAVENOUS
  Filled 2017-05-19: qty 547.2

## 2017-05-19 MED ORDER — HYOSCYAMINE SULFATE 0.125 MG SL SUBL: 0.1250 mg | SUBLINGUAL_TABLET | Freq: Four times a day (QID) | SUBLINGUAL | 0 refills | Status: DC | PRN

## 2017-05-19 MED ORDER — OXYCODONE HCL 5 MG PO TABS: 5.0000 mg | ORAL_TABLET | ORAL | 0 refills | Status: DC | PRN

## 2017-05-19 NOTE — Progress Notes (Signed)
PHARMACY - ADULT TOTAL PARENTERAL NUTRITION CONSULT NOTE   Pharmacy Consult:  TPN Indication:  Prolonged ileus / Colocutaneous fistula  Patient Measurements: Height: 5\' 11"  (180.3 cm) Weight: 113 lb 1.5 oz (51.3 kg) IBW/kg (Calculated) : 75.3 TPN AdjBW (KG): 50.3 Body mass index is 15.77 kg/m.  Assessment:  71 YOM admitted with diverticulitis with perforation and abscess s/p IR drain, transferred from [place] due to respiratory failure. Earlier hospital course complicated by DTs, acute respiratory failure with hypoxia, ARDS, aspiration pneumonia, and circulatory shock requiring intubation and pressors. Also ARF requiring dialysis, now improved and Renal signed off. 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 NGT came out overnight, patient does not want it replaced. Pharmacy consulted to manage TPN for expected prolonged ileus on 05/15/17. Poor PO intake noted for several weeks, RD documented "moderate malnutrition" in assessment 05/12/17.  Diet being advanced to soft, but PA concerned with patient's nutritional status PTA, so thinks patient will benefit from another day of full provision from TPN.   GI: no NG tube per patient request.  Prealbumin WNL at 24.6, drain O/P 58mL, stool O/P minimal at 33mL - PPI IV Endo: CBGs remain controlled. CBG checks stopped 12/3. Insulin requirements in the past 24 hours: 0 units Lytes: wnl, exc Na down to 133. K trending up to 4.8. CoCa 10.5, Phos 4.6 (Ca x Phos = 48) Renal: s/p RRT - SCr down 0.71, BUN WNL - UOP 1.7 ml/kg/hr Pulm: COPD - bilateral thoracentesis 11/23 for pleural effusions.  Stable on RA Cards: VSS Hepatobil: LFTs WNL.  TG normalized Neuro: EtOH use - Robaxin, APAP, PRN Dilaudid/oxycodone. Thiamine/folate/multivitamin in TPN. ID: Zosyn D#6/7 post-op for IAI. Cdiff negative. Afebrile, WBC down to wnl Best Practices: Lovenox TPN Access: PICC placed 05/14/17 TPN start date: 05/15/17  Nutritional  Goals (per RD recommendation on 11/27): 2000-2200 kCal and 100-115 gm protein per day  Current Nutrition:  TPN Soft diet (eating up to 40% of meals) Ensure Enlive BID (received 2 yesterday)  Plan:  1/2 TPN tonight to 40 ml/hr This TPN provides 55 g of protein, 173 g of dextrose, and 20 g of lipids for a total of 1,008 kcals meeting half of patient needs. PO intake to meet the rest Electrolytes in TPN: Cl:Ac 1:1 Add MVI, trace elements, and thiamine and folic acid to TPN Monitor TPN labs Should be able to wean TPN off tomorrow   Elenor Quinones, PharmD, BCPS Clinical Pharmacist Pager [phone] 05/19/2017 8:08 AM

## 2017-05-19 NOTE — Progress Notes (Signed)
Physical Therapy Treatment Patient Details Name: Dominic Phillips MRN: [id number] DOB: 04/18/1961 Today's Date: 05/19/2017    History of Present Illness Pt admitted to [facility] and transferred on 04/02/17 with c/o abdominal pain.  Pt presented with ETOH withdrawls, liver abscess and SBO on TPN.  10/23 presents with hypoxia and increased WBC count, 10/25 intubated, 10/26 scans show PNA and fluid overload, patient extubated on 04/17/17.  Chart review shows COPD, VDRF, anemia, AKI and hypokalemia in addition to previous listed statements.  PMH: ETOH abuse.  11/8 R thoracentesis.  05/13/17 pt underwent sigmoid colectomy with end colostomy.    PT Comments    Patient received in bed finishing preparing to get ready for mobility with mobility tech, willing to work with skilled PT services. Patient able to perform mobility with S in general, but does require cues for safety with gait; fatigued after gait distance of 532f today and limited by pain in abdominal region. Performed exercises in bed today with notable muscle fatigue during all strengthening activities. RN aware of patient's request for medicines for abdominal cramping. Patient left in bed with all needs met, all questions and concerns addressed today.     Follow Up Recommendations  CIR     Equipment Recommendations  Rolling walker with 5" wheels;3in1 (PT)    Recommendations for Other Services       Precautions / Restrictions Precautions Precautions: Fall;Other (comment) Precaution Comments: drains, colostomy bag Restrictions Weight Bearing Restrictions: No    Mobility  Bed Mobility Overal bed mobility: Needs Assistance Bed Mobility: Supine to Sit;Sit to Supine     Supine to sit: Supervision Sit to supine: Supervision      Transfers Overall transfer level: Needs assistance Equipment used: Rolling walker (2 wheeled) Transfers: Sit to/from Stand Sit to Stand: Supervision             Ambulation/Gait Ambulation/Gait assistance: Supervision Ambulation Distance (Feet): 500 Feet Assistive device: Rolling walker (2 wheeled) Gait Pattern/deviations: Step-through pattern;Decreased stride length;Trunk flexed     General Gait Details: verbal cues for safety   Stairs            Wheelchair Mobility    Modified Rankin (Stroke Patients Only)       Balance Overall balance assessment: Needs assistance Sitting-balance support: Bilateral upper extremity supported;Feet supported Sitting balance-Leahy Scale: Good     Standing balance support: Bilateral upper extremity supported Standing balance-Leahy Scale: Good                              Cognition Arousal/Alertness: Awake/alert Behavior During Therapy: WFL for tasks assessed/performed Overall Cognitive Status: Within Functional Limits for tasks assessed Area of Impairment: Memory;Safety/judgement                               General Comments: patient very talkative today, ongoing reduced awareness of impairments       Exercises General Exercises - Lower Extremity Long Arc Quad: Both;Strengthening;5 reps;Seated Heel Slides: Strengthening;Both;10 reps;Supine Hip ABduction/ADduction: Both;Strengthening;10 reps;Supine Other Exercises Other Exercises: manually resisted clams 1x5 supine     General Comments        Pertinent Vitals/Pain Pain Assessment: 0-10 Pain Score: 10-Worst pain ever Pain Location: abdomen  Pain Descriptors / Indicators: Grimacing;Guarding Pain Intervention(s): Limited activity within patient's tolerance;Monitored during session    Home Living  Prior Function            PT Goals (current goals can now be found in the care plan section) Acute Rehab PT Goals Patient Stated Goal: wants to get back home and back to independence to care for his son PT Goal Formulation: With patient Time For Goal Achievement:  05/29/17 Potential to Achieve Goals: Good Progress towards PT goals: Progressing toward goals    Frequency    Min 3X/week      PT Plan Current plan remains appropriate    Co-evaluation              AM-PAC PT "6 Clicks" Daily Activity  Outcome Measure  Difficulty turning over in bed (including adjusting bedclothes, sheets and blankets)?: None Difficulty moving from lying on back to sitting on the side of the bed? : None Difficulty sitting down on and standing up from a chair with arms (e.g., wheelchair, bedside commode, etc,.)?: None Help needed moving to and from a bed to chair (including a wheelchair)?: None Help needed walking in hospital room?: A Little Help needed climbing 3-5 steps with a railing? : A Lot 6 Click Score: 21    End of Session   Activity Tolerance: Patient limited by pain Patient left: in bed;with call bell/phone within reach Nurse Communication: Patient requests pain meds PT Visit Diagnosis: Unsteadiness on feet (R26.81);Muscle weakness (generalized) (M62.81);Other abnormalities of gait and mobility (R26.89)     Time: 3086-5784 PT Time Calculation (min) (ACUTE ONLY): 29 min  Charges:  $Gait Training: 8-22 mins $Therapeutic Exercise: 8-22 mins                    G Codes:       Deniece Ree PT, DPT, CBIS  Supplemental Physical Therapist [place]

## 2017-05-19 NOTE — Progress Notes (Signed)
[facility]/Trauma Progress Note  6 Days Post-Op   Assessment/Plan Hx of tobacco use Hx of alcohol abuse Anemia- hgb 10.4, hct 33.6, follow Chronic kidney disease  Acute diverticulitis with perforation. -[place]olph[place]10/18/18 diverticulitis with drain placement  -Transferred due to respiratory failure10/27/2018. -Earlier hospital course, complicated by DTs, acute respiratory failure with hypoxia, ARDS, aspiration pneumonia and circulatory shock which required intubation and pressors. Also ARF requiring dialysis.Lungs are improving. 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 Rolm Bookbinder  - afebrile,VSS, HEN27.7 - BID wet to dry to midline incision - OOB/mobilize and use IS - PT/OT pleural effusions -bilateral thoracentesis11/23 Loculated left-sided ascites.600 mL aspirated. Cultures negative.  OEU:MPNT diet, TPN 1/2 tonight  IR:WERX stopped 11/16, IV zosyn (11/18=>>) Plan to continue IV abx for a total of 7 days post-op (til 12/05) DVT: SCD's, lovenox Foley: D/C11/29 Follow up: Dr. Rolm Bookbinder  Plan:mobilize, IS, continue TPN for now. May be ready for discharge tomorrow from a surgical standpoint.     LOS: 38 days    Subjective: CC: abdominal soreness  No nausea or vomiting. Tolerating diet. No fever or chills. Ready to leave. No new complaints.   Objective: Vital signs in last 24 hours: Temp:  [97.9 F (36.6 C)-98.6 F (37 C)] 97.9 F (36.6 C) (12/04 0516) Pulse Rate:  [88-93] 88 (12/04 0516) Resp:  [18] 18 (12/04 0516) BP: (121-129)/(74-85) 129/85 (12/04 0516) SpO2:  [99 %-100 %] 100 % (12/04 0516) Weight:  [113 lb 1.5 oz (51.3 kg)] 113 lb 1.5 oz (51.3 kg) (12/04 0516) Last BM Date: 05/18/17  Intake/Output from previous day: 12/03 0701 - 12/04 0700 In: 1290 [I.V.:1290] Out: 2195 [Urine:2100; Drains:45; Stool:50] Intake/Output this shift: Total I/O In: -  Out: 200  [Urine:200]  PE: Gen: Alert, NAD, cooperative Card: Regular rate and rhythm Pulm: Normal effort and rate Abd: Soft,+BS, not distended,nontender, midline incisiongranulating nicely - no drainage or surrounding erythema, LLQ stoma viable with brown stool in bag. JP drains are serosanguinous Extremities: PICC in RUQ Skin: warm and dry, no rashes  Psych: A&Ox3  Anti-infectives: Anti-infectives (From admission, onward)   Start     Dose/Rate Route Frequency Ordered Stop   05/08/17 1000  fluconazole (DIFLUCAN) tablet 100 mg     100 mg Oral Daily 05/08/17 0603 05/12/17 0817   05/04/17 0130  piperacillin-tazobactam (ZOSYN) IVPB 3.375 g     3.375 g 12.5 mL/hr over 240 Minutes Intravenous Every 8 hours 05/03/17 2211     04/28/17 1530  vancomycin (VANCOCIN) IVPB 750 mg/150 ml premix  Status:  Discontinued     750 mg 150 mL/hr over 60 Minutes Intravenous Every 24 hours 04/28/17 1424 05/01/17 1128   04/27/17 1400  piperacillin-tazobactam (ZOSYN) IVPB 3.375 g  Status:  Discontinued     3.375 g 12.5 mL/hr over 240 Minutes Intravenous Every 8 hours 04/27/17 1158 05/03/17 2211   04/26/17 1830  vancomycin (VANCOCIN) 1,250 mg in sodium chloride 0.9 % 250 mL IVPB  Status:  Discontinued     1,250 mg 166.7 mL/hr over 90 Minutes Intravenous Every 48 hours 04/26/17 1739 04/28/17 1424   04/24/17 1700  vancomycin (VANCOCIN) IVPB 1000 mg/200 mL premix  Status:  Discontinued     1,000 mg 200 mL/hr over 60 Minutes Intravenous  Once 04/24/17 1652 04/24/17 1656   04/24/17 1700  vancomycin (VANCOCIN) IVPB 1000 mg/200 mL premix  Status:  Discontinued     1,000 mg 200 mL/hr over 60 Minutes Intravenous Every 48 hours 04/24/17 1656 04/26/17 1739  04/23/17 1500  piperacillin-tazobactam (ZOSYN) IVPB 3.375 g  Status:  Discontinued     3.375 g 12.5 mL/hr over 240 Minutes Intravenous Every 12 hours 04/23/17 1107 04/27/17 1158   04/18/17 1800  piperacillin-tazobactam (ZOSYN) IVPB 3.375 g  Status:  Discontinued      3.375 g 12.5 mL/hr over 240 Minutes Intravenous Every 12 hours 04/18/17 1032 04/23/17 1107   04/18/17 1400  piperacillin-tazobactam (ZOSYN) IVPB 3.375 g  Status:  Discontinued     3.375 g 12.5 mL/hr over 240 Minutes Intravenous Every 8 hours 04/18/17 1031 04/18/17 1032   04/17/17 1200  piperacillin-tazobactam (ZOSYN) IVPB 3.375 g  Status:  Discontinued     3.375 g 100 mL/hr over 30 Minutes Intravenous Every 6 hours 04/17/17 1030 04/18/17 1031   04/17/17 1100  piperacillin-tazobactam (ZOSYN) IVPB 3.375 g  Status:  Discontinued     3.375 g 12.5 mL/hr over 240 Minutes Intravenous Every 8 hours 04/17/17 1019 04/17/17 1030   04/15/17 2125  Ampicillin-Sulbactam (UNASYN) 3 g in sodium chloride 0.9 % 100 mL IVPB  Status:  Discontinued     3 g 200 mL/hr over 30 Minutes Intravenous Every 8 hours 04/15/17 1529 04/17/17 1019   04/15/17 1100  ampicillin-sulbactam (UNASYN) 1.5 g in sodium chloride 0.9 % 50 mL IVPB  Status:  Discontinued     1.5 g 100 mL/hr over 30 Minutes Intravenous Every 12 hours 04/15/17 0937 04/15/17 1529   04/13/17 2200  levofloxacin (LEVAQUIN) IVPB 500 mg  Status:  Discontinued     500 mg 100 mL/hr over 60 Minutes Intravenous Every 48 hours 04/12/17 0114 04/12/17 0846   04/13/17 1800  piperacillin-tazobactam (ZOSYN) IVPB 2.25 g  Status:  Discontinued     2.25 g 100 mL/hr over 30 Minutes Intravenous Every 8 hours 04/13/17 1050 04/15/17 0937   04/12/17 0200  piperacillin-tazobactam (ZOSYN) IVPB 3.375 g  Status:  Discontinued     3.375 g 12.5 mL/hr over 240 Minutes Intravenous Every 8 hours 04/12/17 0103 04/13/17 1050   04/12/17 0000  levofloxacin (LEVAQUIN) IVPB 750 mg  Status:  Discontinued     750 mg 100 mL/hr over 90 Minutes Intravenous Every 48 hours 04/11/17 2230 04/12/17 0114      Lab Results:  Recent Labs    05/18/17 0349  WBC 10.2  HGB 8.9*  HCT 27.8*  PLT 546*   BMET Recent Labs    05/18/17 0349 05/19/17 0405  NA 136 133*  K 4.7 4.8  CL 106 101  CO2  22 24  GLUCOSE 116* 108*  BUN 17 22*  CREATININE 0.71 0.73  CALCIUM 8.9 8.9   PT/INR No results for input(s): LABPROT, INR in the last 72 hours. CMP     Component Value Date/Time   NA 133 (L) 05/19/2017 0405   K 4.8 05/19/2017 0405   CL 101 05/19/2017 0405   CO2 24 05/19/2017 0405   GLUCOSE 108 (H) 05/19/2017 0405   BUN 22 (H) 05/19/2017 0405   CREATININE 0.73 05/19/2017 0405   CALCIUM 8.9 05/19/2017 0405   PROT 6.1 (L) 05/18/2017 0349   ALBUMIN 1.9 (L) 05/18/2017 0349   AST 16 05/18/2017 0349   ALT 10 (L) 05/18/2017 0349   ALKPHOS 91 05/18/2017 0349   BILITOT 0.4 05/18/2017 0349   GFRNONAA >60 05/19/2017 0405   GFRAA >60 05/19/2017 0405   Lipase  No results found for: LIPASE  Studies/Results: No results found.    Kalman Drape , [facility] 05/19/2017, 7:46 AM  Pager: [phone] Consults: [phone] Mon-Fri 7:00 am-4:30 pm Sat-Sun 7:00 am-11:30 am

## 2017-05-19 NOTE — Discharge Summary (Signed)
Physician Discharge Summary  Dominic Phillips YTK:[id number] DOB: 1961-06-08 DOA: 04/11/2017  PCP: No primary care provider on file.  Admit date: 04/11/2017 Discharge date: 05/20/2017  Time spent: 45 minutes  Recommendations for Outpatient Follow-up:  1. PCP in 1 week, Follow Up CXR in 4-6weeks 2. Dr.WakeField CCS in 1-2weeks   Discharge Diagnoses:    Acute diverticulitis with perforation   Acute respiratory distress syndrome (ARDS) ([place])   Acute kidney injury ([place])   Colonic diverticular abscess   Severe malnutrition   COPD (chronic obstructive pulmonary disease) (H[facility])   Dyspnea   HCAP (healthcare-associated pneumonia)   Acute blood loss anemia   Anemia of chronic disease   ETOH abuse   Tobacco abuse   Post-operative pain   Palliative care by specialist   History of ETT   Pleural effusion   Encounter for imaging study to confirm orogastric (OG) tube placement   Endotracheally intubated   Routine adult health maintenance   Discharge Condition: stable  Diet recommendation: soft bland diet advance as tolerated  Filed Weights   05/15/17 0255 05/18/17 0500 05/19/17 0516  Weight: 52.6 kg (115 lb 15.4 oz) 54.7 kg (120 lb 9.5 oz) 51.3 kg (113 lb 1.5 oz)    History of present illness:  56 year old male with past medical history of alcohol abuse presented to [facility] on 04/02/2017 with abdominal pain without fevers or leukocytosis. CT abdomen revealed pericolonic inflammation/fluid collection and he was admitted for diverticular abscess.   Hospital Course:  Briefly: 56 year old male with past medical history of alcohol abuse presented to [facility] on 04/02/2017 with abdominal pain without fevers or leukocytosis. CT abdomen noted acute diverticulitis with perforation and diverticular abscess.General surgery was consulted who suggested placement of percutaneous drain by IR. Cultures grew Escherichia coli. Hospital course complicated by DTs, acute respiratory failure  with hypoxia/ARDS/aspiration pneumonia and circulatory shock for which he required intubation and pressors; acute kidney injury for which she required CRRT and hemodialysis 1, Respiratory failure resolved Renal function is improving. Nephrology signed off. Then underwent sigmoid colectomy and end ostomy on 11/28 by Dr. Donne Hazel, then had post op ileus , TNA started 11/30, now slowly improving tolerating soft diet today, plan for DC tomorrow  Detailed Course:   Acute hypoxic respiratory failure/ARDS with bilateral pleural effusions - Multifactorial second to ARDS and Pulm edema from sepsis/fluid resuscitation due to severe Sepsis/perforated diverticulitis - resolved - Required intubation from 04/11/2017-04/17/17 - Status post thoracentesis on 04/23/2017 with removal of 1.3 L fluid and bilateral thoracentesis on 05/08/2017 with removal of 800 mL from left and 1100 mL from right - stable now, resolved, FU CXR in 4-6weeks, ? 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 - no improvement with nonoperative management finally and that ended up with sigmoid colectomy and end ostomy on 11/28 by Dr. Donne Hazel -Then had post op ileus, started on TNA 11/30 -Per Dr.Wakefield, will complete course of IV Zosyn tomorrow -diet being advanced to soft diet today having stool in stoma -weaning off TNA today -plan for Home tomorrow if stable with CCS follow up, pts caregiver/CNA given info regarding wound dressing and JP drain care  Hypokalemia -Improved, replaced  Acute kidney injury status post CRRT on 04/11/2017 and intermittent dialysis -due to sepsis, resolved -Renal function improved and stable. Nephrology has signed off -HD catheter removed on 04/25/2017 -creatinine normal now  Anemia of chronic disease -  Status post 1 unit of packed red cells transfusion - start Iron after post Op FU due to Gi side effects  Thrombocytosis - reactive; monitor  Severe malnutrition of chronic illness -supplements per nutrition recommendations  Consultants:  General surgery IR PCCM   Procedures: IR drain in [facility] on 04/02/2017 CRRT Hemodialysis 1 Removal of HD dialysis catheter Intubation and extubation from 04/11/2017-04/17/17 Thoracentesis 2   Discharge Exam: Vitals:   05/19/17 0516 05/19/17 1419  BP: 129/85 130/82  Pulse: 88 92  Resp: 18 18  Temp: 97.9 F (36.6 C) 98 F (36.7 C)  SpO2: 100% 100%    General: AAOx3, extremely cachectic Cardiovascular: S1S2/RRR Respiratory: CTAB  Discharge Instructions   Discharge Instructions    Diet - low sodium heart healthy   Complete by:  As directed    Increase activity slowly   Complete by:  As directed      Allergies as of 05/19/2017   No Known Allergies     Medication List    TAKE these medications   acetaminophen 325 MG tablet Commonly known as:  TYLENOL Take 2 tablets (650 mg total) by mouth every 6 (six) hours.   hyoscyamine 0.125 MG SL tablet Commonly known as:  LEVSIN SL Place 1 tablet (0.125 mg total) under the tongue every 6 (six) hours as needed for cramping.   oxyCODONE 5 MG immediate release tablet Commonly known as:  Oxy IR/ROXICODONE Take 1 tablet (5 mg total) by mouth every 4 (four) hours as needed for moderate pain or severe pain (5mg  for moderate pain, 10mg  for severe pain).      No Known Allergies Follow-up Information    Rolm Bookbinder, MD Follow up in 2 week(s).   Specialty:  General Surgery Contact information: [address] [place] [address] [phone]        PCP. Schedule an appointment as soon as possible for a visit in 1 week(s).            The results of significant diagnostics from this hospitalization (including imaging, microbiology,  ancillary and laboratory) are listed below for reference.    Significant Diagnostic Studies: Ct Abdomen Pelvis Wo Contrast  Result Date: 05/04/2017 CLINICAL DATA:  Diverticulitis post perforation and abscess, post drainage procedure, follow-up, question and colocutaneous fistula, mild leukocytosis EXAM: CT ABDOMEN AND PELVIS WITHOUT CONTRAST TECHNIQUE: Multidetector CT imaging of the abdomen and pelvis was performed following the standard protocol without IV contrast. Sagittal and coronal MPR images reconstructed from axial data set. Patient drank dilute oral contrast for exam. COMPARISON:  04/19/2017 FINDINGS: Lower chest: Moderate BILATERAL pleural effusions. Compressive atelectasis of the lower lobes with question consolidation in RIGHT lower lobe. Hepatobiliary: Gallbladder and liver normal appearance Pancreas: Normal appearance Spleen: Normal appearance Adrenals/Urinary Tract: Adrenal glands, kidneys, and ureters normal appearance. Bladder is incompletely distended. Bladder wall appears mildly thickened, question due to true wall thickening such as from prior pelvic inflammation or an artifact from underdistention Stomach/Bowel: Rectal tube with question mild rectal wall thickening. Sigmoid diverticulosis changes. Remainder of colon and small bowel loops unremarkable. Stomach decompressed. Vascular/Lymphatic: Atherosclerotic calcifications aorta and iliac arteries without aneurysm. No definite adenopathy. Reproductive: Minimal prostatic enlargement. Seminal vesicles unremarkable. Other: Pigtail drainage catheter at site of prior abscess without residual associated fluid collection. Scattered free fluid with a focal area of prominent fluid in the lateral LEFT mid abdomen, question free fluid versus loculated, appears to be displacing bowel loops, collection of fluid  at this site overall measuring approximately 13.3 x 9.7 x 16.6 cm. No definite free intraperitoneal air. No hernia. Musculoskeletal:  Remarkable IMPRESSION: No residual fluid collection at site of pigtail drainage catheter in RIGHT pelvis. Moderate BILATERAL pleural effusions with compressive atelectasis of both lower lobes and question RIGHT lower lobe consolidation. Scattered ascites with question loculated versus 3 collection in the LEFT mid abdomen, appears to be the displacing bowel loops, overall 13.3 x 9.7 x 16.6 cm in size ; this could represent sterile or infected fluid and if an infected collection is suspected consider aspiration. Electronically Signed   By: Lavonia Dana M.D.   On: 05/04/2017 20:04   Ct Abdomen Pelvis Wo Contrast  Result Date: 04/20/2017 CLINICAL DATA:  Abdominal infection EXAM: CT ABDOMEN AND PELVIS WITHOUT CONTRAST TECHNIQUE: Multidetector CT imaging of the abdomen and pelvis was performed following the standard protocol without IV contrast. COMPARISON:  Ultrasound 04/13/2017, radiograph 04/11/2017, 04/10/2017, 04/07/2017, 04/02/2017 FINDINGS: Lower chest: Moderate to large bilateral pleural effusions. Scattered ground-glass densities at the lingula and right middle lobe. Dense right greater than left lower lobe consolidations heart size is nonenlarged. Hepatobiliary: No focal liver abnormality is seen. No gallstones, gallbladder wall thickening, or biliary dilatation. Pancreas: Unremarkable. No pancreatic ductal dilatation or surrounding inflammatory changes. Spleen: Normal in size without focal abnormality. Adrenals/Urinary Tract: Adrenal glands are within normal limits. No hydronephrosis. Foley catheter and air in the urinary bladder. Stomach/Bowel: There is contrast within the stomach. Dilated loops of small bowel containing contrast, these measure up to 3.3 cm. Tapering 2 decompressed distal small bowel in the pelvis. Colon is collapsed. A rectal catheter remains in place. Mild sigmoid colon diverticular disease with minimal wall thickening. Vascular/Lymphatic: Aortic atherosclerosis. No aneurysmal dilatation.  No significant adenopathy Reproductive: Prostate is unremarkable. Other: Negative for free air. Right pelvic pigtail drainage catheter re- demonstrated with multiple gas bubbles around the pigtail loop. Slight decrease in size of a small residual fluid collection measuring 2.8 cm in the right pelvis, likely reflecting small residual abscess. Continued moderate ascites within the abdomen and pelvis with small loculated collection along the posterior aspect of the right hepatic lobe. Musculoskeletal: Degenerative changes. No acute or suspicious bone lesion. IMPRESSION: 1. Stable positioning of the right pelvic drainage catheter. Multiple foci of gas around the pigtail loop but no significant residual abscess collection around the pigtail catheter. Catheter tip is in very close proximity to the sigmoid colon and fistula cannot be ruled out ; direct catheter injection may be helpful for assessment. 2. Slight decrease in size of the residual right pelvic fluid collection/abscess which does not appear to communicate with the right pelvic drain 3. Continued small bowel dilatation with transition to decompressed distal small bowel in the pelvis suggesting bowel obstruction. This does not appear worse in the interim. 4. Moderate amount of ascites within the abdomen and pelvis with some loculated appearance of the ascites adjacent to the inferior right hepatic lobe 5. Moderate to large bilateral pleural effusions with right greater than left lower lobe consolidations, possible pneumonia Electronically Signed   By: Donavan Foil M.D.   On: 04/20/2017 01:24   Dg Chest 1 View  Result Date: 04/23/2017 CLINICAL DATA:  Status post right thoracentesis. EXAM: CHEST 1 VIEW COMPARISON:  Portable chest x-ray of earlier today. FINDINGS: The volume of pleural fluid on the right has decreased. No postprocedure pneumothorax is observed. There is persistent right basilar atelectasis as well as small amount of pleural fluid. The  interstitial markings of both  lungs remain increased. The heart is normal in size. The pulmonary vascularity is not engorged. The dual-lumen right internal jugular venous catheter tip projects over the midportion of the SVC. IMPRESSION: Successful right sided thoracentesis. No more than a small amount of pleural fluid remains. No postprocedure pneumothorax. Electronically Signed   By: David  Martinique M.D.   On: 04/23/2017 16:10   Dg Chest 2 View  Result Date: 05/08/2017 CLINICAL DATA:  Follow-up pneumonia EXAM: CHEST  2 VIEW COMPARISON:  05/04/2015 FINDINGS: Cardiac shadow is stable. Persistent right basilar infiltrate and effusion is noted. Improved aeration in the left base is noted. Persistent posterior fusion is noted on the left. Calcified granuloma in the left lung base is seen. No acute bony abnormality is noted. IMPRESSION: Bilateral pleural effusions and persistent right basilar infiltrate. Electronically Signed   By: Inez Catalina M.D.   On: 05/08/2017 07:16   Dg Chest 2 View  Result Date: 05/02/2017 CLINICAL DATA:  Hospital acquired pneumonia EXAM: CHEST  2 VIEW COMPARISON:  04/30/2017 FINDINGS: Diffuse bilateral airspace opacities have worsened since prior study. Heart is upper limits normal in size. Small bilateral pleural effusions, right greater than left. IMPRESSION: Worsening diffuse bilateral airspace disease which could represent infection or edema. Small bilateral effusions. Electronically Signed   By: Rolm Baptise M.D.   On: 05/02/2017 09:28   Dg Chest 2 View  Result Date: 04/30/2017 CLINICAL DATA:  Acute respiratory failure with hypoxia. EXAM: CHEST  2 VIEW COMPARISON:  Radiograph of April 28, 2017. FINDINGS: Stable cardiomegaly. No pneumothorax is noted. Stable diffuse interstitial densities are noted concerning for pulmonary edema or inflammation. Minimal left basilar subsegmental atelectasis is noted. Mild left pleural effusion is noted. Stable right basilar atelectasis or  infiltrate is noted with mild right pleural effusion. Bony thorax is unremarkable. IMPRESSION: Stable bilateral pulmonary edema with bilateral pleural effusions. Stable right basilar atelectasis or infiltrate is noted. Electronically Signed   By: Marijo Conception, M.D.   On: 04/30/2017 09:44   Dg Chest Port 1 View  Result Date: 05/11/2017 CLINICAL DATA:  56 year old male with perforated abdominal bowel, abscess. Moderate to large bilateral pleural effusions and bilateral lower lung disease on 05/04/2017. EXAM: PORTABLE CHEST 1 VIEW COMPARISON:  05/10/2017 and earlier. FINDINGS: Portable AP upright view at 1458 hours. Small if any residual bilateral pleural effusions. Normal cardiac size and mediastinal contours. Improved bibasilar ventilation. Regressed but not fully resolved patchy right lower lung opacity. No areas of worsening ventilation. No pneumothorax. No pneumoperitoneum is evident. Negative visible bowel gas pattern. IMPRESSION: 1. Improved bilateral lung base ventilation since 05/10/2017 with regressed but not resolved right lower lung opacity. 2. Small if any residual pleural effusions. 3. No new cardiopulmonary abnormality. Electronically Signed   By: Genevie Ann M.D.   On: 05/11/2017 15:27   Dg Chest Port 1 View  Result Date: 05/10/2017 CLINICAL DATA:  Pleural effusion. EXAM: PORTABLE CHEST 1 VIEW COMPARISON:  05/09/2017 FINDINGS: The cardiomediastinal silhouette is within normal limits. Confluent, oval density in the right lung base is similar to the prior study and may reflect airspace consolidation or loculated pleural fluid in a fissure. Patchy opacity more laterally and inferiorly in the right lung base on the prior study has improved. There is mild patchy opacity in the left lung base which is stable to slightly increased. A calcified granuloma is again noted in the left lung base. No pneumothorax is identified. IMPRESSION: Mildly improved aeration of the right lung base with residual  consolidation and/or  loculated pleural fluid. Stable to slightly increased left basilar atelectasis or infiltrate. Electronically Signed   By: Logan Bores M.D.   On: 05/10/2017 07:32   Dg Chest Port 1 View  Result Date: 05/09/2017 CLINICAL DATA:  Bilateral thoracentesis on 05/08/2017. Evaluate for pleural effusion. EXAM: PORTABLE CHEST 1 VIEW COMPARISON:  05/08/2017 FINDINGS: No definite pneumothorax on this examination. Increased densities in the right lower chest compared to the most recent examination. Heart and mediastinum are within normal limits and stable. The trachea is midline. Calcified granuloma at the left lung base. IMPRESSION: Increased densities in the right lower chest. Findings could represent increasing airspace disease and/or pleural fluid. No pneumothorax. Electronically Signed   By: Markus Daft M.D.   On: 05/09/2017 10:09   Dg Chest Port 1 View  Result Date: 05/08/2017 CLINICAL DATA:  Pleural effusion. Post bilateral thoracentesis earlier this day. EXAM: PORTABLE CHEST 1 VIEW COMPARISON:  Most recent comparison earlier this day at 1500 hour, nearly 4 hours prior. FINDINGS: Unchanged heart size and mediastinal contours. Tiny left apicolateral pneumothorax, less than 5%, not seen on prior exam. No right pneumothorax. Increased patchy and interstitial opacities in both lung bases, right greater than left, may be atelectasis or re-expansion pulmonary edema post thoracentesis. No definite pleural fluid seen on single upright view. Calcified granuloma at the left lung base. IMPRESSION: Tiny left apical and lateral pneumothorax, less than 5%, not seen on exam earlier this day. Increasing patchy and interstitial bibasilar opacities, right greater than left, may be atelectasis or re-expansion pulmonary edema post thoracentesis ease today. These results were called by telephone at the time of interpretation on 05/08/2017 at 7:32 pm to Dr. Jani Gravel , who verbally acknowledged these results.  Electronically Signed   By: Jeb Levering M.D.   On: 05/08/2017 19:32   Dg Chest Port 1 View  Result Date: 05/08/2017 CLINICAL DATA:  Status post left thoracentesis EXAM: PORTABLE CHEST 1 VIEW COMPARISON:  05/08/2017 at 1413 hours FINDINGS: No pneumothorax is seen status post left thoracentesis. Mild right basilar atelectasis/scarring. Suspected trace right pleural effusion. Calcified granuloma at the lateral left lung base. The heart is normal in size. IMPRESSION: No pneumothorax is seen status post left thoracentesis. These results were called by telephone at the time of interpretation on 05/08/2017 at 3:15 pm to Dr. Heber Gulkana, who verbally acknowledged these results. Electronically Signed   By: Julian Hy M.D.   On: 05/08/2017 15:15   Dg Chest Port 1 View  Result Date: 05/08/2017 CLINICAL DATA:  Right-sided thoracentesis. EXAM: PORTABLE CHEST 1 VIEW COMPARISON:  05/08/2017 at 0655 hours. FINDINGS: Marked decrease in right-sided pleural effusion since thoracentesis with trace fluid blunting the right lateral costophrenic angle. No pneumothorax. Heart is top-normal in size. No aortic aneurysm. Stable granuloma at the left lung base. There is platelike atelectasis in the right lower lobe. No acute osseous abnormality. IMPRESSION: Near complete resolution of right-sided pleural effusion status post thoracentesis. Atelectasis is noted at the right lung base. No pneumothorax is visualized Electronically Signed   By: Ashley Royalty M.D.   On: 05/08/2017 14:40   Dg Chest Port 1 View  Result Date: 05/03/2017 CLINICAL DATA:  Hypoxemia EXAM: PORTABLE CHEST 1 VIEW COMPARISON:  05/02/2017 FINDINGS: Heart is borderline in size. Layering right pleural effusion with right lower lobe airspace opacity is similar prior study. Diffuse interstitial prominence throughout the lungs could reflect interstitial edema. Left base atelectasis. IMPRESSION: Continued layering right effusion with right lower lobe atelectasis  or pneumonia. Suspect  mild interstitial edema. Left base atelectasis. Electronically Signed   By: Rolm Baptise M.D.   On: 05/03/2017 08:54   Dg Chest Port 1 View  Result Date: 04/28/2017 CLINICAL DATA:  Shortness of breath EXAM: PORTABLE CHEST 1 VIEW COMPARISON:  04/28/2017, 04/27/2017, 04/26/2017 FINDINGS: Small right-sided pleural effusion and tiny left effusion. Stable cardiomediastinal silhouette with vascular congestion. Extensive interstitial right greater than left opacity suspect for edema. More focal consolidation or pneumonia at the bases, not significantly changed. No pneumothorax. IMPRESSION: 1. Little change in bilateral effusions and diffuse interstitial and alveolar opacity suspicious for edema with possible bibasilar atelectasis or pneumonia. Electronically Signed   By: Donavan Foil M.D.   On: 04/28/2017 23:56   Dg Chest Port 1 View  Result Date: 04/28/2017 CLINICAL DATA:  Shortness of breath. EXAM: PORTABLE CHEST 1 VIEW COMPARISON:  Radiograph of April 27, 2017. FINDINGS: Stable cardiomediastinal silhouette. Stable diffuse interstitial densities are noted throughout both lungs most consistent with pulmonary edema. No pneumothorax is noted. Mild right basilar atelectasis or infiltrate is noted. Minimal right pleural effusion cannot be excluded. Bony thorax is unremarkable. IMPRESSION: Stable diffuse interstitial densities are noted throughout both lungs concerning for pulmonary edema. Mild right basilar atelectasis or infiltrate is noted with minimal right pleural effusion. Electronically Signed   By: Marijo Conception, M.D.   On: 04/28/2017 08:06   Dg Chest Port 1 View  Result Date: 04/27/2017 CLINICAL DATA:  57 year old male with shortness breath. Subsequent encounter. EXAM: PORTABLE CHEST 1 VIEW COMPARISON:  04/26/2017 chest x-ray. FINDINGS: Asymmetric airspace disease greater on right which may represent pulmonary edema with right-sided pleural effusion and basilar atelectasis.  Limited for excluding right base infiltrate or mass given the degree of pleural fluid and atelectasis. No pneumothorax. Heart size within normal limits. IMPRESSION: Minimal change from prior examination. Persistent asymmetric airspace disease greater on right which may represent pulmonary edema with right-sided pleural effusion and basilar atelectasis. Cannot exclude right lower lobe infiltrate in this setting of pleural fluid and right base atelectasis. Electronically Signed   By: Genia Del M.D.   On: 04/27/2017 07:47   Dg Chest Port 1 View  Result Date: 04/26/2017 CLINICAL DATA:  Shortness of Breath EXAM: PORTABLE CHEST 1 VIEW COMPARISON:  April 25, 2017 and April 23, 2017 FINDINGS: Central catheter is been removed. No pneumothorax. There is widespread interstitial pulmonary edema. Patchy airspace opacity is noted bilaterally with somewhat increased consolidation medially in the right mid and lower lung zones. Patchy alveolar opacity is seen throughout the perihilar regions bilaterally, slightly more severe on the left than on the right. There are small pleural effusions bilaterally. Heart is upper normal in size with pulmonary venous hypertension. No evident adenopathy. No bone lesions. Small calcified granuloma again noted at left base. IMPRESSION: Pulmonary vascular congestion with extensive pulmonary edema and small pleural effusions. Consolidation on the right is concerning for superimposed pneumonia in the lower lung zone region. The extent of the edema overall is similar 1 day prior. Electronically Signed   By: Lowella Grip III M.D.   On: 04/26/2017 07:10   Dg Chest Port 1 View  Result Date: 04/25/2017 CLINICAL DATA:  Shortness of Breath EXAM: PORTABLE CHEST 1 VIEW COMPARISON:  April 24, 2017 FINDINGS: Central catheter tip is in the superior vena cava. No pneumothorax. There is diffuse interstitial edema. There is patchy airspace consolidation throughout the left mid lung and right  mid lower lung zones with consolidation greatest in the right mid lower lung zone  regions. There is a small right pleural effusion. There is a small calcified granuloma left base. Heart is upper normal in size with pulmonary venous hypertension. No adenopathy evident. No bone lesions. IMPRESSION: Pulmonary vascular congestion. Diffuse interstitial pulmonary edema with patchy alveolar pulmonary edema bilaterally. Superimposed pneumonia, particularly in the right mid to lower lung zone, cannot be excluded. Appearance similar to 1 day prior, although somewhat deeper degree of inspiration on current examination. No new opacity appreciable. Small granuloma left base, stable. Electronically Signed   By: Lowella Grip III M.D.   On: 04/25/2017 07:37   Dg Chest Port 1 View  Result Date: 04/24/2017 CLINICAL DATA:  Aspiration EXAM: PORTABLE CHEST 1 VIEW COMPARISON:  04/23/2017 FINDINGS: Right-sided central venous catheter tip overlies the distal SVC. Small bilateral effusions. Stable cardiomediastinal silhouette. Interval worsening of diffuse interstitial and alveolar opacity. No pneumothorax. IMPRESSION: 1. Small bilateral effusions 2. Interval worsening of diffuse interstitial and alveolar opacity consistent with worsening edema or diffuse pneumonia Electronically Signed   By: Donavan Foil M.D.   On: 04/24/2017 02:02   Dg Chest Port 1 View  Result Date: 04/23/2017 CLINICAL DATA:  Shortness of breath, ARDS, COPD, healthcare associated pneumonia, current smoker. EXAM: PORTABLE CHEST 1 VIEW COMPARISON:  Chest x-ray of April 21, 2017 FINDINGS: The left lung is well-expanded. The interstitial markings remain increased diffusely. On the right there remains a moderate-sized pleural effusion. Ended it has decreased slightly in size. The interstitial markings remain increased and the hemidiaphragm obscured. The heart is normal in size. The pulmonary vascularity remains engorged. The dual-lumen dialysis catheter tip  projects over the midportion of the SVC. IMPRESSION: Slight interval decrease in the volume of pleural fluid on the right. Persistent pulmonary interstitial edema. Electronically Signed   By: David  Martinique M.D.   On: 04/23/2017 08:52   Dg Chest Port 1 View  Result Date: 04/21/2017 CLINICAL DATA:  56 year old male with pleural effusion. Subsequent encounter. EXAM: PORTABLE CHEST 1 VIEW COMPARISON:  04/17/2017 chest x-ray. FINDINGS: Right-sided central line tip distal superior vena cava level. Left central line, endotracheal tube and nasogastric tube and been removed. Increase in size of right-sided pleural effusion now moderately large. Asymmetric airspace disease has progressed and may represent pulmonary edema. Limited for evaluating for underlying infiltrate or mass. Heart size within normal limits. Calcified aorta. IMPRESSION: Left central line, endotracheal tube and nasogastric tube and been removed. Increase in size of right-sided pleural effusion now moderately large. Asymmetric airspace disease has progressed and may represent pulmonary edema. Limited for evaluating for possibly of underlying infiltrate or mass. Electronically Signed   By: Genia Del M.D.   On: 04/21/2017 10:29   Ir Paracentesis  Result Date: 05/05/2017 INDICATION: Patient with history of diverticulitis, now with large intra-abdominal fluid collection suspicious for loculated ascites. Is made for diagnostic and therapeutic paracentesis. EXAM: ULTRASOUND GUIDED DIAGNOSTIC AND THERAPEUTIC PARACENTESIS MEDICATIONS: 10 mL 2% lidocaine COMPLICATIONS: None immediate. PROCEDURE: Informed written consent was obtained from the patient after a discussion of the risks, benefits and alternatives to treatment. A timeout was performed prior to the initiation of the procedure. Initial ultrasound scanning demonstrates a large amount of ascites within the left lateral abdomen. The left lateral abdomen was prepped and draped in the usual sterile  fashion. 2% lidocaine was used for local anesthesia. Following this, a 19 gauge, 7-cm, Yueh catheter was introduced. An ultrasound image was saved for documentation purposes. The paracentesis was performed. The catheter was removed and a dressing was applied. The patient  tolerated the procedure well without immediate post procedural complication. FINDINGS: A total of approximately 600 mL of clear, yellow fluid was removed. Samples were sent to the laboratory as requested by the clinical team. IMPRESSION: Successful ultrasound-guided diagnostic and therapeutic paracentesis yielding 600 mL of peritoneal fluid. Read by:  Brynda Greathouse PA-C Electronically Signed   By: Aletta Edouard M.D.   On: 05/05/2017 16:06   Ir Thoracentesis Asp Pleural Space W/img Guide  Result Date: 04/23/2017 INDICATION: Shortness of breath, ARDS, COPD, healthcare associated pneumonia, current smoker, pleural effusion. Request for diagnostic and therapeutic thoracentesis. EXAM: ULTRASOUND GUIDED RIGHT THORACENTESIS MEDICATIONS: 2% Lidocaine = 10 mL COMPLICATIONS: None immediate. PROCEDURE: An ultrasound guided thoracentesis was thoroughly discussed with the patient and questions answered. The benefits, risks, alternatives and complications were also discussed. The patient understands and wishes to proceed with the procedure. Written consent was obtained. Ultrasound was performed to localize and mark an adequate pocket of fluid in the right chest. The area was then prepped and draped in the normal sterile fashion. 1% Lidocaine was used for local anesthesia. Under ultrasound guidance a 6 Fr Safe-T-Centesis catheter was introduced. Thoracentesis was performed. The catheter was removed and a dressing applied. FINDINGS: A total of approximately 1.3 liters of clear yellow fluid was removed. Samples were sent to the laboratory as requested by the clinical team. IMPRESSION: Successful ultrasound guided right thoracentesis yielding 1.3 liters of  pleural fluid. No pneumothorax on post procedure chest X-ray. Read by:  Gareth Eagle, PA-C Electronically Signed   By: Corrie Mckusick D.O.   On: 04/23/2017 16:37    Microbiology: Recent Results (from the past 240 hour(s))  Surgical pcr screen     Status: None   Collection Time: 05/13/17  4:41 AM  Result Value Ref Range Status   MRSA, PCR NEGATIVE NEGATIVE Final   Staphylococcus aureus NEGATIVE NEGATIVE Final    Comment: (NOTE) The Xpert SA Assay (FDA approved for NASAL specimens in patients 75 years of age and older), is one component of a comprehensive surveillance program. It is not intended to diagnose infection nor to guide or monitor treatment.      Labs: Basic Metabolic Panel: Recent Labs  Lab 05/15/17 0354 05/16/17 0452 05/17/17 0407 05/18/17 0349 05/19/17 0405  NA 139 138 135 136 133*  K 3.6 3.5 3.9 4.7 4.8  CL 107 106 106 106 101  CO2 24 23 23 22 24   GLUCOSE 86 124* 109* 116* 108*  BUN 14 9 11 17  22*  CREATININE 1.14 0.86 0.76 0.71 0.73  CALCIUM 8.4* 8.2* 8.8* 8.9 8.9  MG 1.7 1.8 1.7 2.2 2.1  PHOS 4.8* 2.8 4.5 5.0* 4.6   Liver Function Tests: Recent Labs  Lab 05/15/17 0354 05/18/17 0349  AST 14* 16  ALT 9* 10*  ALKPHOS 71 91  BILITOT 0.3 0.4  PROT 6.1* 6.1*  ALBUMIN 2.0* 1.9*   No results for input(s): LIPASE, AMYLASE in the last 168 hours. No results for input(s): AMMONIA in the last 168 hours. CBC: Recent Labs  Lab 05/13/17 0412 05/13/17 2325 05/14/17 0522 05/15/17 0354 05/16/17 0000 05/18/17 0349  WBC 11.3* 19.0* 17.2* 13.9* 12.8* 10.2  NEUTROABS 7.1  --  14.1* 10.7*  --  6.7  HGB 10.1* 11.9* 10.4* 8.7* 8.3* 8.9*  HCT 31.9* 37.5* 33.6* 28.0* 26.5* 27.8*  MCV 94.4 96.2 94.9 96.6 95.7 93.3  PLT 524* 501* 494* 494* 507* 546*   Cardiac Enzymes: No results for input(s): CKTOTAL, CKMB, CKMBINDEX, TROPONINI in the last  168 hours. BNP: BNP (last 3 results) No results for input(s): BNP in the last 8760 hours.  ProBNP (last 3 results) No  results for input(s): PROBNP in the last 8760 hours.  CBG: Recent Labs  Lab 05/17/17 1154 05/17/17 1812 05/18/17 0037 05/18/17 0542 05/18/17 1200  GLUCAP 116* 113* 119* 113* 103*       Signed:  Domenic Polite MD.  [facility] 05/19/2017, 3:55 PM

## 2017-05-19 NOTE — Progress Notes (Signed)
Caregiver in for education regarding care of wound. Wound dressing , JP teaching given and showed to the caregiver. She verbalizes understanding. Patient stated he is okey with the ostomy care of his ostomy.

## 2017-05-20 DIAGNOSIS — R0902 Hypoxemia: Secondary | ICD-10-CM

## 2017-05-20 MED ORDER — HYOSCYAMINE SULFATE 0.125 MG SL SUBL: 0.1250 mg | SUBLINGUAL_TABLET | Freq: Four times a day (QID) | SUBLINGUAL | 0 refills | Status: DC | PRN

## 2017-05-20 MED ORDER — OXYCODONE HCL 5 MG PO TABS: 5.0000 mg | ORAL_TABLET | ORAL | 0 refills | Status: DC | PRN

## 2017-05-20 MED FILL — oxyCODONE HCL 5 MG TABS: 5 | 2 days supply | Qty: 20 | Fill #0

## 2017-05-20 MED FILL — OSCIMIN SL 0.125 MG TABLET: 0.125 | 5 days supply | Qty: 20 | Fill #0

## 2017-05-20 NOTE — Progress Notes (Signed)
[facility]/Trauma Progress Note  7 Days Post-Op   Assessment/Plan  Hx of tobacco use Hx of alcohol abuse Anemia- hgb 10.4, hct 33.6, follow Chronic kidney disease  Acute diverticulitis with perforation. -Randolphadm10/18/18 diverticulitis with drain placement  -Transferred due to respiratory failure10/27/2018. -Earlier hospital course, complicated by DTs, acute respiratory failure with hypoxia, ARDS, aspiration pneumonia and circulatory shock which required intubation and pressors. Also ARF requiring dialysis.Lungs are improving. 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 Rolm Bookbinder  - afebrile,VSS, WCB76.2 (12/03) - BID wet to dry to midline incision - OOB/mobilize and use IS - PT/OT pleural effusions -bilateral thoracentesis11/23 Loculated left-sided ascites.600 mL aspirated. Cultures negative.  GBT:DVVO diet, TPN 1/2 tonight  HY:WVPX stopped 11/16, IV zosyn (11/18=>>) Plan to continue IV abx for a total of 7 days post-op (til 12/05) DVT: SCD's, lovenox Foley: D/C11/29 Follow up: Dr. Rolm Bookbinder  Plan:Clear for discharge from a surgical standpoint. He will leave with his drains. No antibiotics at discharge needed.   LOS: 39 days    Subjective: CC: wants to go home  Pt is tolerating diet, ambulating, having ostomy output. He states he has a CNA at home that takes care of his disabled son that will help him with dressing changes.   Objective: Vital signs in last 24 hours: Temp:  [98 F (36.7 C)-98.1 F (36.7 C)] 98.1 F (36.7 C) (12/05 0523) Pulse Rate:  [92-96] 92 (12/05 0523) Resp:  [16-18] 16 (12/05 0523) BP: (113-130)/(71-82) 113/71 (12/05 0523) SpO2:  [99 %-100 %] 99 % (12/05 0523) Last BM Date: 05/19/17  Intake/Output from previous day: 12/04 0701 - 12/05 0700 In: 2382 [P.O.:1200; I.V.:1132; IV Piggyback:50] Out: 3060 [Urine:2800; Drains:10; Stool:250] Intake/Output  this shift: Total I/O In: 360 [P.O.:360] Out: 800 [Urine:800]  PE: Gen: Alert, NAD, cooperative Card: Regular rate and rhythm Pulm: CTA b/l, normal effort and rate Abd: Soft,+BS, not distended,nontender, midline incisiongranulating nicely - no drainage or surrounding erythema, LLQ stoma viable with brown stool in bag. JP drains are serosanguinous Extremities: PICC in RUQ Skin: warm and dry, no rashes  Psych: A&Ox3   Anti-infectives: Anti-infectives (From admission, onward)   Start     Dose/Rate Route Frequency Ordered Stop   05/08/17 1000  fluconazole (DIFLUCAN) tablet 100 mg     100 mg Oral Daily 05/08/17 0603 05/12/17 0817   05/04/17 0130  piperacillin-tazobactam (ZOSYN) IVPB 3.375 g     3.375 g 12.5 mL/hr over 240 Minutes Intravenous Every 8 hours 05/03/17 2211     04/28/17 1530  vancomycin (VANCOCIN) IVPB 750 mg/150 ml premix  Status:  Discontinued     750 mg 150 mL/hr over 60 Minutes Intravenous Every 24 hours 04/28/17 1424 05/01/17 1128   04/27/17 1400  piperacillin-tazobactam (ZOSYN) IVPB 3.375 g  Status:  Discontinued     3.375 g 12.5 mL/hr over 240 Minutes Intravenous Every 8 hours 04/27/17 1158 05/03/17 2211   04/26/17 1830  vancomycin (VANCOCIN) 1,250 mg in sodium chloride 0.9 % 250 mL IVPB  Status:  Discontinued     1,250 mg 166.7 mL/hr over 90 Minutes Intravenous Every 48 hours 04/26/17 1739 04/28/17 1424   04/24/17 1700  vancomycin (VANCOCIN) IVPB 1000 mg/200 mL premix  Status:  Discontinued     1,000 mg 200 mL/hr over 60 Minutes Intravenous  Once 04/24/17 1652 04/24/17 1656   04/24/17 1700  vancomycin (VANCOCIN) IVPB 1000 mg/200 mL premix  Status:  Discontinued     1,000 mg 200 mL/hr over 60 Minutes Intravenous Every  48 hours 04/24/17 1656 04/26/17 1739   04/23/17 1500  piperacillin-tazobactam (ZOSYN) IVPB 3.375 g  Status:  Discontinued     3.375 g 12.5 mL/hr over 240 Minutes Intravenous Every 12 hours 04/23/17 1107 04/27/17 1158   04/18/17 1800   piperacillin-tazobactam (ZOSYN) IVPB 3.375 g  Status:  Discontinued     3.375 g 12.5 mL/hr over 240 Minutes Intravenous Every 12 hours 04/18/17 1032 04/23/17 1107   04/18/17 1400  piperacillin-tazobactam (ZOSYN) IVPB 3.375 g  Status:  Discontinued     3.375 g 12.5 mL/hr over 240 Minutes Intravenous Every 8 hours 04/18/17 1031 04/18/17 1032   04/17/17 1200  piperacillin-tazobactam (ZOSYN) IVPB 3.375 g  Status:  Discontinued     3.375 g 100 mL/hr over 30 Minutes Intravenous Every 6 hours 04/17/17 1030 04/18/17 1031   04/17/17 1100  piperacillin-tazobactam (ZOSYN) IVPB 3.375 g  Status:  Discontinued     3.375 g 12.5 mL/hr over 240 Minutes Intravenous Every 8 hours 04/17/17 1019 04/17/17 1030   04/15/17 2125  Ampicillin-Sulbactam (UNASYN) 3 g in sodium chloride 0.9 % 100 mL IVPB  Status:  Discontinued     3 g 200 mL/hr over 30 Minutes Intravenous Every 8 hours 04/15/17 1529 04/17/17 1019   04/15/17 1100  ampicillin-sulbactam (UNASYN) 1.5 g in sodium chloride 0.9 % 50 mL IVPB  Status:  Discontinued     1.5 g 100 mL/hr over 30 Minutes Intravenous Every 12 hours 04/15/17 0937 04/15/17 1529   04/13/17 2200  levofloxacin (LEVAQUIN) IVPB 500 mg  Status:  Discontinued     500 mg 100 mL/hr over 60 Minutes Intravenous Every 48 hours 04/12/17 0114 04/12/17 0846   04/13/17 1800  piperacillin-tazobactam (ZOSYN) IVPB 2.25 g  Status:  Discontinued     2.25 g 100 mL/hr over 30 Minutes Intravenous Every 8 hours 04/13/17 1050 04/15/17 0937   04/12/17 0200  piperacillin-tazobactam (ZOSYN) IVPB 3.375 g  Status:  Discontinued     3.375 g 12.5 mL/hr over 240 Minutes Intravenous Every 8 hours 04/12/17 0103 04/13/17 1050   04/12/17 0000  levofloxacin (LEVAQUIN) IVPB 750 mg  Status:  Discontinued     750 mg 100 mL/hr over 90 Minutes Intravenous Every 48 hours 04/11/17 2230 04/12/17 0114      Lab Results:  Recent Labs    05/18/17 0349  WBC 10.2  HGB 8.9*  HCT 27.8*  PLT 546*   BMET Recent Labs     05/18/17 0349 05/19/17 0405  NA 136 133*  K 4.7 4.8  CL 106 101  CO2 22 24  GLUCOSE 116* 108*  BUN 17 22*  CREATININE 0.71 0.73  CALCIUM 8.9 8.9   PT/INR No results for input(s): LABPROT, INR in the last 72 hours. CMP     Component Value Date/Time   NA 133 (L) 05/19/2017 0405   K 4.8 05/19/2017 0405   CL 101 05/19/2017 0405   CO2 24 05/19/2017 0405   GLUCOSE 108 (H) 05/19/2017 0405   BUN 22 (H) 05/19/2017 0405   CREATININE 0.73 05/19/2017 0405   CALCIUM 8.9 05/19/2017 0405   PROT 6.1 (L) 05/18/2017 0349   ALBUMIN 1.9 (L) 05/18/2017 0349   AST 16 05/18/2017 0349   ALT 10 (L) 05/18/2017 0349   ALKPHOS 91 05/18/2017 0349   BILITOT 0.4 05/18/2017 0349   GFRNONAA >60 05/19/2017 0405   GFRAA >60 05/19/2017 0405   Lipase  No results found for: LIPASE  Studies/Results: No results found.    Kalman Drape ,  [facility] 05/20/2017, 10:07 AM Pager: [phone] Consults: [phone] Mon-Fri 7:00 am-4:30 pm Sat-Sun 7:00 am-11:30 am

## 2017-05-20 NOTE — Discharge Instructions (Signed)
[facility], [place] [phone]  OPEN ABDOMINAL SURGERY: POST OP INSTRUCTIONS  Always review your discharge instruction sheet given to you by the facility where your surgery was performed.  IF YOU HAVE DISABILITY OR FAMILY LEAVE FORMS, YOU MUST BRING THEM TO THE OFFICE FOR PROCESSING.  PLEASE DO NOT GIVE THEM TO YOUR DOCTOR.  1. A prescription for pain medication may be given to you upon discharge.  Take your pain medication as prescribed, if needed.  If narcotic pain medicine is not needed, then you may take acetaminophen (Tylenol) or ibuprofen (Advil) as needed. 2. Take your usually prescribed medications unless otherwise directed. 3. If you need a refill on your pain medication, please contact your pharmacy. They will contact our office to request authorization.  Prescriptions will not be filled after 5pm or on week-ends. 4. You should follow a light diet the first few days after arrival home, such as soup and crackers, pudding, etc.unless your doctor has advised otherwise. A high-fiber, low fat diet can be resumed as tolerated.   Be sure to include lots of fluids daily. Most patients will experience some swelling and bruising on the chest and neck area.  Ice packs will help.  Swelling and bruising can take several days to resolve 5. Most patients will experience some swelling and bruising in the area of the incision. Ice pack will help. Swelling and bruising can take several days to resolve..  6. It is common to experience some constipation if taking pain medication after surgery.  Increasing fluid intake and taking a stool softener will usually help or prevent this problem from occurring.  A mild laxative (Milk of Magnesia or Miralax) should be taken according to package directions if there are no bowel movements after 48 hours. 7.  You may have steri-strips (small skin tapes) in place directly over the incision.  These strips should be left on the skin for 7-10 days.  If your  surgeon used skin glue on the incision, you may shower in 24 hours.  The glue will flake off over the next 2-3 weeks.  Any sutures or staples will be removed at the office during your follow-up visit. You may find that a light gauze bandage over your incision may keep your staples from being rubbed or pulled. You may shower and replace the bandage daily. 8. ACTIVITIES:  You may resume regular (light) daily activities beginning the next day--such as daily self-care, walking, climbing stairs--gradually increasing activities as tolerated.  You may have sexual intercourse when it is comfortable.  Refrain from any heavy lifting or straining until approved by your doctor. a. You may drive when you no longer are taking prescription pain medication, you can comfortably wear a seatbelt, and you can safely maneuver your car and apply brakes b. Return to Work: when approved by Dr. Donne Hazel 9. You should see your doctor in the office for a follow-up appointment approximately two weeks after your surgery.  Make sure that you call for this appointment within a day or two after you arrive home to insure a convenient appointment time. OTHER INSTRUCTIONS:  _____________________________________________________________ _____________________________________________________________  WHEN TO CALL YOUR DOCTOR: 1. Fever over 101.0 2. Inability to urinate 3. Nausea and/or vomiting 4. Extreme swelling or bruising 5. Continued bleeding from incision. 6. Increased pain, redness, or drainage from the incision. 7. Difficulty swallowing or breathing 8. Muscle cramping or spasms. 9. Numbness or tingling in hands or feet or around lips.  The clinic  staff is available to answer your questions during regular business hours.  Please dont hesitate to call and ask to speak to one of the nurses if you have concerns.  For further questions, please visit [email or web address]   [facility] Surgical drains  are used to remove extra fluid that normally builds up in a surgical wound after surgery. A surgical drain helps to heal a surgical wound. Different kinds of surgical drains include:  Active drains. These drains use suction to pull drainage away from the surgical wound. Drainage flows through a tube to a container outside of the body. It is important to keep the bulb or the drainage container flat (compressed) at all times, except while you empty it. Flattening the bulb or container creates suction. The two most common types of active drains are bulb drains and Hemovac drains.  Passive drains. These drains allow fluid to drain naturally, by gravity. Drainage flows through a tube to a bandage (dressing) or a container outside of the body. Passive drains do not need to be emptied. The most common type of passive drain is the Penrose drain.  A drain is placed during surgery. Immediately after surgery, drainage is usually bright red and a little thicker than water. The drainage may gradually turn yellow or pink and become thinner. It is likely that your health care provider will remove the drain when the drainage stops or when the amount decreases to 1-2 Tbsp (15-30 mL) during a 24-hour period.  How to care for your surgical drain  Keep the skin around the drain dry and covered with a dressing at all times.  Check your drain area every day for signs of infection. Check for: ? More redness, swelling, or pain. ? Pus or a bad smell. ? Cloudy drainage. Follow instructions from your health care provider about how to take care of your drain and how to change your dressing. Change your dressing at least one time every day. Change it more often if needed to keep the dressing dry. Make sure you: 1. Gather your supplies, including: ? Tape. ? Germ-free cleaning solution (sterile saline). ? Split gauze drain sponge: 4 x 4 inches (10 x 10 cm). ? Gauze square: 4 x 4 inches (10 x 10 cm). 2. Wash your hands with  soap and water before you change your dressing. If soap and water are not available, use hand sanitizer. 3. Remove the old dressing. Avoid using scissors to do that. 4. Use sterile saline to clean your skin around the drain. 5. Place the tube through the slit in a drain sponge. Place the drain sponge so that it covers your wound. 6. Place the gauze square or another drain sponge on top of the drain sponge that is on the wound. Make sure the tube is between those layers. 7. Tape the dressing to your skin. 8. If you have an active bulb or Hemovac drain, tape the drainage tube to your skin 1-2 inches (2.5-5 cm) below the place where the tube enters your body. Taping keeps the tube from pulling on any stitches (sutures) that you have. 9. Wash your hands with soap and water. 10. Write down the color of your drainage and how often you change your dressing.  How to empty your active bulb or Hemovac drain 1. Make sure that you have a measuring cup that you can empty your drainage into. 2. Wash your hands with soap and water. If soap and water are not available, use hand sanitizer.  3. Gently move your fingers down the tube while squeezing very lightly. This is called stripping the tube. This clears any drainage, clots, or tissue from the tube. ? Do not pull on the tube. ? You may need to strip the tube several times every day to keep the tube clear. 4. Open the bulb cap or the drain plug. Do not touch the inside of the cap or the bottom of the plug. 5. Empty all of the drainage into the measuring cup. 6. Compress the bulb or the container and replace the cap or the plug. To compress the bulb or the container, squeeze it firmly in the middle while you close the cap or plug the container. 7. Write down the amount of drainage that you have in each 24-hour period. If you have less than 2 Tbsp (30 mL) of drainage during 24 hours, contact your health care provider. 8. Flush the drainage down the  toilet. 9. Wash your hands with soap and water. Contact a health care provider if:  You have more redness, swelling, or pain around your drain area.  The amount of drainage that you have is increasing instead of decreasing.  You have pus or a bad smell coming from your drain area.  You have a fever.  You have drainage that is cloudy.  There is a sudden stop or a sudden decrease in the amount of drainage that you have.  Your tube falls out.  Your active draindoes not stay compressedafter you empty it. This information is not intended to replace advice given to you by your health care provider. Make sure you discuss any questions you have with your health care provider. Document Released: 05/30/2000 Document Revised: 11/08/2015 Document Reviewed: 12/20/2014 Elsevier Interactive Patient Education  2018 [place]: - midline dressing to be changed twice daily - supplies: sterile saline, kerlix, scissors, ABD pads, tape  - remove dressing and all packing carefully, moistening with sterile saline as needed to avoid packing/internal dressing sticking to the wound. - clean edges of skin around the wound with water/gauze, making sure there is no tape debris or leakage left on skin that could cause skin irritation or breakdown. - dampen and clean kerlix with sterile saline and pack wound from wound base to skin level, making sure to take note of any possible areas of wound tracking, tunneling and packing appropriately. Wound can be packed loosely. Trim kerlix to size if a whole kerlix is not required. - cover wound with a dry ABD pad and secure with tape.  - write the date/time on the dry dressing/tape to better track when the last dressing change occurred. - apply any skin protectant/powder recommended by clinician to protect skin/skin folds. - change dressing as needed if leakage occurs, wound gets contaminated, or patient requests to shower. - patient may shower  daily with wound open and following the shower the wound should be dried and a clean dressing placed.     [facility]  [address] [place] [address] Phone [phone] For hospital follow up

## 2017-05-20 NOTE — Progress Notes (Signed)
PHARMACY - ADULT TOTAL PARENTERAL NUTRITION CONSULT NOTE   Pharmacy Consult:  TPN Indication:  Prolonged ileus / Colocutaneous fistula  Patient Measurements: Height: 5\' 11"  (180.3 cm) Weight: 113 lb 1.5 oz (51.3 kg) IBW/kg (Calculated) : 75.3 TPN AdjBW (KG): 50.3 Body mass index is 15.77 kg/m.  Assessment:  12 YOM admitted with diverticulitis with perforation and abscess s/p IR drain, transferred from [facility] due to respiratory failure. Earlier hospital course complicated by DTs, acute respiratory failure with hypoxia, ARDS, aspiration pneumonia, and circulatory shock requiring intubation and pressors. Also ARF requiring dialysis, now improved and Renal signed off. 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 NGT came out overnight, patient does not want it replaced. Pharmacy consulted to manage TPN for expected prolonged ileus on 05/15/17. Poor PO intake noted for several weeks, RD documented "moderate malnutrition" in assessment 05/12/17.  Diet being advanced to soft, but PA concerned with patient's nutritional status PTA, so thinks patient will benefit from another day of full provision from TPN.   GI: no NG tube per patient request.  Prealbumin WNL at 24.6, drain O/P 70mL, stool O/P minimal at 32mL - PPI IV Endo: CBGs remain controlled. CBG checks stopped 12/3. Insulin requirements in the past 24 hours: 0 units Lytes: wnl, exc Na down to 133. K trending up to 4.8. CoCa 10.5, Phos 4.6 (Ca x Phos = 48) Renal: s/p RRT - SCr down 0.71, BUN WNL - UOP 1.7 ml/kg/hr Pulm: COPD - bilateral thoracentesis 11/23 for pleural effusions.  Stable on RA Cards: VSS Hepatobil: LFTs WNL.  TG normalized Neuro: EtOH use - Robaxin, APAP, PRN Dilaudid/oxycodone. Thiamine/folate/multivitamin in TPN. ID: Zosyn D#7/7 post-op for IAI. Cdiff negative. Afebrile, WBC down to wnl Best Practices: Lovenox TPN Access: PICC placed 05/14/17 TPN start date: 05/15/17  Nutritional  Goals (per RD recommendation on 11/27): 2000-2200 kCal and 100-115 gm protein per day  Current Nutrition:  TPN Soft diet (eating up to 40% of meals) Ensure Enlive BID (received 2 yesterday)  Plan:  Wean off TPN today D/C all TPN orders and labs   Elenor Quinones, PharmD, [facility] Clinical Pharmacist Pager [phone] 05/20/2017 7:44 AM

## 2017-05-20 NOTE — Discharge Planning (Addendum)
Patient IV and PICC removed per orders.  Drains left intact per orders and will be re-assessd at patient's FU appt with general surgery.  RN assessment and VS revealed stability for DC to home. Patient has caregiver at home to assist with dressing changes and other needs.  Abdominal dressing changed prior to discharge and a few extra supplies given to patient to assist with purchases needed from pharmacy (for future dressing changes).  Match Letter provided by CM to assist with getting meds. Informed of suggested FU appt and appt made.  Script printed, signed and given.  Once ready, will be wheeled to front and family transporting home via car. Waiting on ride to arrive

## 2017-05-20 NOTE — Care Management Note (Signed)
Case Management Note  Patient Details  Name: Akira Adelsberger MRN: [id number] Date of Birth: 1961/02/25  Subjective/Objective:                    Action/Plan:  See previous notes  Expected Discharge Date:  05/20/17               Expected Discharge Plan:  Home/Self Care  In-House Referral:  Clinical Social Work  Discharge planning Services  CM Consult, [facility], [facility], Medication Assistance  Post Acute Care Choice:    Choice offered to:  Patient  DME Arranged:    DME Agency:     HH Arranged:    [place] Agency:     Status of Service:  Completed, signed off  If discussed at [employer], dates discussed:    Additional Comments:  Marilu Favre, RN 05/20/2017, 12:41 PM

## 2017-05-20 NOTE — Progress Notes (Signed)
PT Cancellation Note  Patient Details Name: Dominic Phillips MRN: [id number] DOB: 1960/08/17   Cancelled Treatment:    Noted MD DC summary and intent for patient to discharge home today; regardless stopped by patient's room to offer further support from skilled PT services. Patient reports that he is grateful that PT has been working with him, but he would like to focus on getting ready for return home today, declines participation in PT today.   Deniece Ree PT, DPT, CBIS  Supplemental Physical Therapist [facility]

## 2017-05-20 NOTE — Progress Notes (Signed)
Patient ID: Dominic Phillips, male   DOB: Nov 10, 1960, 56 y.o.   MRN: [id number]  PROGRESS NOTE    Dominic Phillips  OBS:[id number] DOB: [birth year] DOA: 04/11/2017 PCP: No primary care provider on file.   Brief Narrative:  56 year old with history of presented to the hospital on 04/02/2017 with abdominal pain and was found to have pericolonic inflammation/fluid collection, he was admitted for diverticular abscess.  General surgery was consulted who suggested percutaneous drain by IR.  Cultures grew E. coli.  Hospital course was complicated by DTs, acute respiratory failure with hypoxia//aspiration pneumonia and circulatory shock for which he required intubation and pressors; acute kidney injury for which he required CRRT and hemodialysis x1.  Respiratory failure resolved. Renal Function improved.  Nephrology signed off.  Patient underwent sigmoid colectomy and an ostomy on 05/13/2017. He developed postop ileus, TNA started on 05/15/17, now slowly improving.  Today will be the last dose of intravenous antibiotics.  General surgery has cleared the patient for discharge.  He is tolerating.  Wound care/dressing changes as per general surgery recommendations.  Please refer to the full discharge summary done by Dr. Broadus John on 05/19/2017 for full details  Assessment & Plan:   Active Problems:   Acute respiratory distress syndrome (ARDS) (HCC)   Acute kidney injury ([place])   Pressure injury of skin   Colonic diverticular abscess   COPD (chronic obstructive pulmonary disease) (HCC)   Dyspnea   HCAP (healthcare-associated pneumonia)   Acute blood loss anemia   Anemia of chronic disease   ETOH abuse   Tobacco abuse   Tachycardia   Tachypnea   Leukocytosis   Post-operative pain   Acute respiratory failure with hypoxia ([place])   Palliative care by specialist   History of ETT   Pleural effusion   Encounter for imaging study to confirm orogastric (OG) tube placement   Endotracheally intubated   Routine adult  health maintenance    Acute hypoxic respiratory failure/ARDS with bilateral pleural effusions - Multifactorial second to ARDS and Pulm edema from sepsis/fluid resuscitation due to severe Sepsis/perforated diverticulitis - resolved - Required intubation from 04/11/2017-04/17/17 - Status post thoracentesis on 04/23/2017 with removal of 1.3 L fluid and bilateral thoracentesis on 05/08/2017 with removal of 800 mL from left and 1100 mL from right - stable now, resolved, FU CXR in 4-6weeks  Diverticulitis with perforation and abscess and Colocutaneous fistula - General surgery following,  s/p IR drainage - Repeat CT 04/19/17: No significant residual abscess around drain, 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 - no improvement with nonoperative management finally and that ended up with sigmoid colectomy and end ostomy on 11/28 by Dr. Donne Hazel -Then had post op ileus, started on TNA 11/30 - will finish a course of Zosyn today.  General surgery has cleared the patient for discharge -Wound care/dressing change as per general surgery recommendations.  Outpatient follow-up with general surgery -Tolerating diet.  Discontinue TNA on discharge.  Hypokalemia -Improved, replaced  Acute kidney injury status post CRRT on 04/11/2017 and intermittent dialysis -due to sepsis, resolved -Renal function improved and stable. Nephrology has signed off -HD catheter removed on 04/25/2017 -creatinine normal now  Anemia of chronic disease - Status post 1 unit of packed red cells transfusion - Stable.  Outpatient follow-up  Thrombocytosis - reactive; monitor -Outpatient follow-up  Severe malnutrition of chronic illness -outpatient follow-up   DVT prophylaxis: Lovenox Code Status: Full Family Communication: None at bedside  Disposition Plan: Discharge home today  Consultants:  General surgery IR PCCM    Procedures:  IR drain in [facility] on 04/02/2017 CRRT Hemodialysis 1 Removal of  HD dialysis catheter Intubation and extubation from 04/11/2017-04/17/17 Thoracentesis 2  sigmoid colectomy and end ostomy on 05/13/17    Antimicrobials: Anti-infectives (From admission, onward)   Start     Dose/Rate Route Frequency Ordered Stop   05/08/17 1000  fluconazole (DIFLUCAN) tablet 100 mg     100 mg Oral Daily 05/08/17 0603 05/12/17 0817   05/04/17 0130  piperacillin-tazobactam (ZOSYN) IVPB 3.375 g     3.375 g 12.5 mL/hr over 240 Minutes Intravenous Every 8 hours 05/03/17 2211     04/28/17 1530  vancomycin (VANCOCIN) IVPB 750 mg/150 ml premix  Status:  Discontinued     750 mg 150 mL/hr over 60 Minutes Intravenous Every 24 hours 04/28/17 1424 05/01/17 1128   04/27/17 1400  piperacillin-tazobactam (ZOSYN) IVPB 3.375 g  Status:  Discontinued     3.375 g 12.5 mL/hr over 240 Minutes Intravenous Every 8 hours 04/27/17 1158 05/03/17 2211   04/26/17 1830  vancomycin (VANCOCIN) 1,250 mg in sodium chloride 0.9 % 250 mL IVPB  Status:  Discontinued     1,250 mg 166.7 mL/hr over 90 Minutes Intravenous Every 48 hours 04/26/17 1739 04/28/17 1424   04/24/17 1700  vancomycin (VANCOCIN) IVPB 1000 mg/200 mL premix  Status:  Discontinued     1,000 mg 200 mL/hr over 60 Minutes Intravenous  Once 04/24/17 1652 04/24/17 1656   04/24/17 1700  vancomycin (VANCOCIN) IVPB 1000 mg/200 mL premix  Status:  Discontinued     1,000 mg 200 mL/hr over 60 Minutes Intravenous Every 48 hours 04/24/17 1656 04/26/17 1739   04/23/17 1500  piperacillin-tazobactam (ZOSYN) IVPB 3.375 g  Status:  Discontinued     3.375 g 12.5 mL/hr over 240 Minutes Intravenous Every 12 hours 04/23/17 1107 04/27/17 1158   04/18/17 1800  piperacillin-tazobactam (ZOSYN) IVPB 3.375 g  Status:  Discontinued     3.375 g 12.5 mL/hr over 240 Minutes Intravenous Every 12 hours 04/18/17 1032 04/23/17 1107   04/18/17 1400  piperacillin-tazobactam (ZOSYN) IVPB 3.375 g  Status:  Discontinued     3.375 g 12.5 mL/hr over 240 Minutes Intravenous  Every 8 hours 04/18/17 1031 04/18/17 1032   04/17/17 1200  piperacillin-tazobactam (ZOSYN) IVPB 3.375 g  Status:  Discontinued     3.375 g 100 mL/hr over 30 Minutes Intravenous Every 6 hours 04/17/17 1030 04/18/17 1031   04/17/17 1100  piperacillin-tazobactam (ZOSYN) IVPB 3.375 g  Status:  Discontinued     3.375 g 12.5 mL/hr over 240 Minutes Intravenous Every 8 hours 04/17/17 1019 04/17/17 1030   04/15/17 2125  Ampicillin-Sulbactam (UNASYN) 3 g in sodium chloride 0.9 % 100 mL IVPB  Status:  Discontinued     3 g 200 mL/hr over 30 Minutes Intravenous Every 8 hours 04/15/17 1529 04/17/17 1019   04/15/17 1100  ampicillin-sulbactam (UNASYN) 1.5 g in sodium chloride 0.9 % 50 mL IVPB  Status:  Discontinued     1.5 g 100 mL/hr over 30 Minutes Intravenous Every 12 hours 04/15/17 0937 04/15/17 1529   04/13/17 2200  levofloxacin (LEVAQUIN) IVPB 500 mg  Status:  Discontinued     500 mg 100 mL/hr over 60 Minutes Intravenous Every 48 hours 04/12/17 0114 04/12/17 0846   04/13/17 1800  piperacillin-tazobactam (ZOSYN) IVPB 2.25 g  Status:  Discontinued     2.25 g 100 mL/hr over 30 Minutes Intravenous Every 8 hours 04/13/17 1050 04/15/17 0937   04/12/17 0200  piperacillin-tazobactam (ZOSYN) IVPB 3.375  g  Status:  Discontinued     3.375 g 12.5 mL/hr over 240 Minutes Intravenous Every 8 hours 04/12/17 0103 04/13/17 1050   04/12/17 0000  levofloxacin (LEVAQUIN) IVPB 750 mg  Status:  Discontinued     750 mg 100 mL/hr over 90 Minutes Intravenous Every 48 hours 04/11/17 2230 04/12/17 0114        Subjective: Patient seen and examined at bedside.  He feels better and wants to go home.  No overnight fever or vomiting.  Objective: Vitals:   05/19/17 0516 05/19/17 1419 05/19/17 2127 05/20/17 0523  BP: 129/85 130/82 128/79 113/71  Pulse: 88 92 96 92  Resp: 18 18 18 16   Temp: 97.9 F (36.6 C) 98 F (36.7 C) 98.1 F (36.7 C) 98.1 F (36.7 C)  TempSrc: Oral Oral Oral   SpO2: 100% 100% 100% 99%  Weight:  51.3 kg (113 lb 1.5 oz)     Height:        Intake/Output Summary (Last 24 hours) at 05/20/2017 1024 Last data filed at 05/20/2017 0834 Gross per 24 hour  Intake 2382 ml  Output 3460 ml  Net -1078 ml   Filed Weights   05/15/17 0255 05/18/17 0500 05/19/17 0516  Weight: 52.6 kg (115 lb 15.4 oz) 54.7 kg (120 lb 9.5 oz) 51.3 kg (113 lb 1.5 oz)    Examination:  General exam: Appears calm and comfortable  Respiratory system: Bilateral decreased breath sound at bases Cardiovascular system: S1 & S2 heard, rate controlled  gastrointestinal system: Abdomen is nondistended, soft and nontender.  Dressing present with colostomy   Data Reviewed: I have personally reviewed following labs and imaging studies  CBC: Recent Labs  Lab 05/13/17 2325 05/14/17 0522 05/15/17 0354 05/16/17 0000 05/18/17 0349  WBC 19.0* 17.2* 13.9* 12.8* 10.2  NEUTROABS  --  14.1* 10.7*  --  6.7  HGB 11.9* 10.4* 8.7* 8.3* 8.9*  HCT 37.5* 33.6* 28.0* 26.5* 27.8*  MCV 96.2 94.9 96.6 95.7 93.3  PLT 501* 494* 494* 507* 244*   Basic Metabolic Panel: Recent Labs  Lab 05/15/17 0354 05/16/17 0452 05/17/17 0407 05/18/17 0349 05/19/17 0405  NA 139 138 135 136 133*  K 3.6 3.5 3.9 4.7 4.8  CL 107 106 106 106 101  CO2 24 23 23 22 24   GLUCOSE 86 124* 109* 116* 108*  BUN 14 9 11 17  22*  CREATININE 1.14 0.86 0.76 0.71 0.73  CALCIUM 8.4* 8.2* 8.8* 8.9 8.9  MG 1.7 1.8 1.7 2.2 2.1  PHOS 4.8* 2.8 4.5 5.0* 4.6   GFR: Estimated Creatinine Clearance: 76.6 mL/min (by C-G formula based on SCr of 0.73 mg/dL). Liver Function Tests: Recent Labs  Lab 05/15/17 0354 05/18/17 0349  AST 14* 16  ALT 9* 10*  ALKPHOS 71 91  BILITOT 0.3 0.4  PROT 6.1* 6.1*  ALBUMIN 2.0* 1.9*   No results for input(s): LIPASE, AMYLASE in the last 168 hours. No results for input(s): AMMONIA in the last 168 hours. Coagulation Profile: No results for input(s): INR, PROTIME in the last 168 hours. Cardiac Enzymes: No results for input(s):  CKTOTAL, CKMB, CKMBINDEX, TROPONINI in the last 168 hours. BNP (last 3 results) No results for input(s): PROBNP in the last 8760 hours. HbA1C: No results for input(s): HGBA1C in the last 72 hours. CBG: Recent Labs  Lab 05/17/17 1154 05/17/17 1812 05/18/17 0037 05/18/17 0542 05/18/17 1200  GLUCAP 116* 113* 119* 113* 103*   Lipid Profile: Recent Labs    05/18/17 0349  TRIG  149   Thyroid Function Tests: No results for input(s): TSH, T4TOTAL, FREET4, T3FREE, THYROIDAB in the last 72 hours. Anemia Panel: No results for input(s): VITAMINB12, FOLATE, FERRITIN, TIBC, IRON, RETICCTPCT in the last 72 hours. Sepsis Labs: No results for input(s): PROCALCITON, LATICACIDVEN in the last 168 hours.  Recent Results (from the past 240 hour(s))  Surgical pcr screen     Status: None   Collection Time: 05/13/17  4:41 AM  Result Value Ref Range Status   MRSA, PCR NEGATIVE NEGATIVE Final   Staphylococcus aureus NEGATIVE NEGATIVE Final    Comment: (NOTE) The Xpert SA Assay (FDA approved for NASAL specimens in patients 26 years of age and older), is one component of a comprehensive surveillance program. It is not intended to diagnose infection nor to guide or monitor treatment.          Radiology Studies: No results found.      Scheduled Meds: . acetaminophen  650 mg Oral Q6H  . enoxaparin (LOVENOX) injection  40 mg Subcutaneous Q24H  . feeding supplement (ENSURE ENLIVE)  237 mL Oral BID BM  . mouth rinse  15 mL Mouth Rinse BID  . pantoprazole  40 mg Oral QHS   Continuous Infusions: . piperacillin-tazobactam (ZOSYN)  IV 3.375 g (05/20/17 0918)  . TPN ADULT (ION) 40 mL/hr at 05/19/17 1757     LOS: 37 days        Aline August, MD [facility] Pager [phone]  If 7PM-7AM, please contact night-coverage [email or web address] Password [email or web address] 05/20/2017, 10:24 AM

## 2017-06-13 NOTE — OR Nursing (Signed)
Delayed entry due to correction of wound classification

## 2017-06-22 ENCOUNTER — Other Ambulatory Visit: Payer: Self-pay

## 2017-06-22 ENCOUNTER — Encounter (HOSPITAL_COMMUNITY): Payer: Self-pay | Admitting: General Practice

## 2017-06-22 ENCOUNTER — Emergency Department (HOSPITAL_COMMUNITY): Payer: Medicaid Other

## 2017-06-22 ENCOUNTER — Inpatient Hospital Stay (HOSPITAL_COMMUNITY)
Admission: EM | Admit: 2017-06-22 | Discharge: 2017-06-24 | DRG: 872 | Disposition: A | Discharge status: Home / Self Care | Payer: Medicaid Other | Attending: Family Medicine | Admitting: Family Medicine

## 2017-06-22 DIAGNOSIS — Z9049 Acquired absence of other specified parts of digestive tract: Secondary | ICD-10-CM | POA: Diagnosis not present

## 2017-06-22 DIAGNOSIS — Z681 Body mass index (BMI) 19 or less, adult: Secondary | ICD-10-CM

## 2017-06-22 DIAGNOSIS — R188 Other ascites: Secondary | ICD-10-CM | POA: Diagnosis present

## 2017-06-22 DIAGNOSIS — R64 Cachexia: Secondary | ICD-10-CM | POA: Diagnosis present

## 2017-06-22 DIAGNOSIS — N390 Urinary tract infection, site not specified: Secondary | ICD-10-CM

## 2017-06-22 DIAGNOSIS — E876 Hypokalemia: Secondary | ICD-10-CM | POA: Diagnosis present

## 2017-06-22 DIAGNOSIS — Z933 Colostomy status: Secondary | ICD-10-CM | POA: Diagnosis not present

## 2017-06-22 DIAGNOSIS — D72829 Elevated white blood cell count, unspecified: Secondary | ICD-10-CM | POA: Diagnosis present

## 2017-06-22 DIAGNOSIS — A419 Sepsis, unspecified organism: Principal | ICD-10-CM | POA: Diagnosis present

## 2017-06-22 DIAGNOSIS — F17210 Nicotine dependence, cigarettes, uncomplicated: Secondary | ICD-10-CM | POA: Diagnosis present

## 2017-06-22 DIAGNOSIS — K5900 Constipation, unspecified: Secondary | ICD-10-CM | POA: Diagnosis present

## 2017-06-22 DIAGNOSIS — N3090 Cystitis, unspecified without hematuria: Secondary | ICD-10-CM | POA: Diagnosis present

## 2017-06-22 DIAGNOSIS — R109 Unspecified abdominal pain: Secondary | ICD-10-CM | POA: Diagnosis not present

## 2017-06-22 LAB — URINALYSIS, ROUTINE W REFLEX MICROSCOPIC
Bilirubin Urine: NEGATIVE
GLUCOSE, UA: NEGATIVE mg/dL
KETONES UR: NEGATIVE mg/dL
NITRITE: NEGATIVE
PH: 6 (ref 5.0–8.0)
Protein, ur: 30 mg/dL — AB
SPECIFIC GRAVITY, URINE: 1.006 (ref 1.005–1.030)
Squamous Epithelial / LPF: NONE SEEN

## 2017-06-22 LAB — I-STAT CHEM 8, ED
BUN: <3 mg/dL — ABNORMAL LOW (ref 6–20)
CHLORIDE: 98 mmol/L — AB (ref 101–111)
Calcium, Ion: 1.12 mmol/L — ABNORMAL LOW (ref 1.15–1.40)
Creatinine, Ser: 0.4 mg/dL — ABNORMAL LOW (ref 0.61–1.24)
GLUCOSE: 90 mg/dL (ref 65–99)
HEMATOCRIT: 31 % — AB (ref 39.0–52.0)
HEMOGLOBIN: 10.5 g/dL — AB (ref 13.0–17.0)
POTASSIUM: 2.6 mmol/L — AB (ref 3.5–5.1)
SODIUM: 135 mmol/L (ref 135–145)
TCO2: 26 mmol/L (ref 22–32)

## 2017-06-22 LAB — COMPREHENSIVE METABOLIC PANEL
ALT: 9 U/L — ABNORMAL LOW (ref 17–63)
ANION GAP: 12 (ref 5–15)
AST: 14 U/L — ABNORMAL LOW (ref 15–41)
Albumin: 2.8 g/dL — ABNORMAL LOW (ref 3.5–5.0)
Alkaline Phosphatase: 125 U/L (ref 38–126)
BILIRUBIN TOTAL: 0.5 mg/dL (ref 0.3–1.2)
CALCIUM: 9.4 mg/dL (ref 8.9–10.3)
CO2: 24 mmol/L (ref 22–32)
Chloride: 97 mmol/L — ABNORMAL LOW (ref 101–111)
Creatinine, Ser: 0.54 mg/dL — ABNORMAL LOW (ref 0.61–1.24)
GFR calc Af Amer: >60 mL/min (ref >60)
Glucose, Bld: 96 mg/dL (ref 65–99)
POTASSIUM: 3 mmol/L — AB (ref 3.5–5.1)
Sodium: 133 mmol/L — ABNORMAL LOW (ref 135–145)
TOTAL PROTEIN: 7.3 g/dL (ref 6.5–8.1)

## 2017-06-22 LAB — CBC
HEMATOCRIT: 32.3 % — AB (ref 39.0–52.0)
HEMOGLOBIN: 10.6 g/dL — AB (ref 13.0–17.0)
MCH: 29.8 pg (ref 26.0–34.0)
MCHC: 32.8 g/dL (ref 30.0–36.0)
MCV: 90.7 fL (ref 78.0–100.0)
Platelets: 705 10*3/uL — ABNORMAL HIGH (ref 150–400)
RBC: 3.56 MIL/uL — ABNORMAL LOW (ref 4.22–5.81)
RDW: 15.2 % (ref 11.5–15.5)
WBC: 18.7 10*3/uL — AB (ref 4.0–10.5)

## 2017-06-22 LAB — LIPASE, BLOOD: Lipase: 22 U/L (ref 11–51)

## 2017-06-22 LAB — I-STAT CG4 LACTIC ACID, ED
LACTIC ACID, VENOUS: 1.02 mmol/L (ref 0.5–1.9)
Lactic Acid, Venous: 1.57 mmol/L (ref 0.5–1.9)

## 2017-06-22 MED ORDER — POTASSIUM CHLORIDE 10 MEQ/100ML IV SOLN
10.0000 meq | Freq: Once | INTRAVENOUS | Status: AC
  Administered 2017-06-22: 10 meq via INTRAVENOUS
  Filled 2017-06-22: qty 100

## 2017-06-22 MED ORDER — NICOTINE 21 MG/24HR TD PT24
21.0000 mg | MEDICATED_PATCH | Freq: Every day | TRANSDERMAL | Status: DC
  Administered 2017-06-22 – 2017-06-24 (×3): 21 mg via TRANSDERMAL
  Filled 2017-06-22 (×3): qty 1

## 2017-06-22 MED ORDER — VANCOMYCIN HCL IN DEXTROSE 1-5 GM/200ML-% IV SOLN
1000.0000 mg | Freq: Once | INTRAVENOUS | Status: AC
  Administered 2017-06-22: 1000 mg via INTRAVENOUS
  Filled 2017-06-22: qty 200

## 2017-06-22 MED ORDER — PIPERACILLIN-TAZOBACTAM 3.375 G IVPB
3.3750 g | Freq: Three times a day (TID) | INTRAVENOUS | Status: DC
  Administered 2017-06-23 – 2017-06-24 (×5): 3.375 g via INTRAVENOUS
  Filled 2017-06-22 (×6): qty 50

## 2017-06-22 MED ORDER — IOPAMIDOL (ISOVUE-300) INJECTION 61%
INTRAVENOUS | Status: AC
  Administered 2017-06-22: 100 mL
  Filled 2017-06-22: qty 100

## 2017-06-22 MED ORDER — MORPHINE SULFATE (PF) 4 MG/ML IV SOLN
1.0000 mg | INTRAVENOUS | Status: DC | PRN
  Administered 2017-06-22 – 2017-06-23 (×5): 1 mg via INTRAVENOUS
  Filled 2017-06-22 (×5): qty 1

## 2017-06-22 MED ORDER — PIPERACILLIN-TAZOBACTAM 3.375 G IVPB 30 MIN
3.3750 g | Freq: Once | INTRAVENOUS | Status: AC
  Administered 2017-06-22: 3.375 g via INTRAVENOUS
  Filled 2017-06-22: qty 50

## 2017-06-22 MED ORDER — VANCOMYCIN HCL 500 MG IV SOLR
500.0000 mg | Freq: Two times a day (BID) | INTRAVENOUS | Status: DC
  Administered 2017-06-23 – 2017-06-24 (×3): 500 mg via INTRAVENOUS
  Filled 2017-06-22 (×5): qty 500

## 2017-06-22 NOTE — Progress Notes (Signed)
  Pt admitted to the unit. Pt is stable, alert and oriented per baseline. Oriented to room, staff, and call bell. Educated to call for any assistance. Bed in lowest position, call bell within reach- will continue to monitor. 

## 2017-06-22 NOTE — ED Triage Notes (Signed)
Pt states he has surgery here 1 month ago and had ostomy placed. Pt here today because he states he could not wait to follow up with [facility]  On Thursday pt has increased pain and decrease stool output in ostomy bag.

## 2017-06-22 NOTE — H&P (Signed)
History and Physical    Anselm Aumiller IWP:[id number] DOB: February 24, 1961 DOA: 06/22/2017  PCP: Patient, No Pcp Per  Patient coming from: home  Chief Complaint: fevers and abdominal pain  HPI: Sincere Liuzzi is a 57 y.o. 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 Donne Hazel. He is presenting with complaints of abdominal discomfort which has been ongoing for more than 1 week. This is been persistent nothing he is aware of makes it better. Abdominal pain is located in his lower abdomen and radiates from right to left. Problem has been persistent and gradually getting worse. Patient also endorses fevers. He also reports not noticing much output lately into his colostomy bag. Otherwise reports no other complaints   ED Course: Patient was found to have PE hypokalemic, soft blood pressures, with leukocytosis and tachycardia and tachypnea. They have consulted general surgery and we were subsequently consulted for further evaluation recommendations.  Review of Systems: As per HPI otherwise 10 point review of systems negative.   No past medical history on file.  Past Surgical History:  Procedure Laterality Date  . COLECTOMY WITH COLOSTOMY CREATION/HARTMANN PROCEDURE N/A 05/13/2017   Procedure: COLECTOMY WITH COLOSTOMY CREATION/HARTMANN PROCEDURE;  Surgeon: Rolm Bookbinder, MD;  Location: [facility];  Service: General;  Laterality: N/A;  . IR PARACENTESIS  05/05/2017  . IR THORACENTESIS ASP PLEURAL SPACE W/IMG GUIDE  04/23/2017     reports that he has been smoking cigarettes.  He has been smoking about 3.00 packs per day. he has never used smokeless tobacco. He reports that he drinks about 10.8 oz of alcohol per week. He reports that he does not use drugs.  No Known Allergies  Family history - Denies any family history of any problems when asked directly   Prior to Admission medications   Medication Sig Start Date End Date Taking? Authorizing  Provider  acetaminophen (TYLENOL) 500 MG tablet Take 1,500 mg by mouth every 6 (six) hours as needed.   Yes [provider]  ibuprofen (ADVIL,MOTRIN) 200 MG tablet Take 800 mg by mouth every 6 (six) hours as needed.   Yes [provider]  acetaminophen (TYLENOL) 325 MG tablet Take 2 tablets (650 mg total) by mouth every 6 (six) hours. Patient not taking: Reported on 06/22/2017 05/19/17   Domenic Polite, MD  hyoscyamine (LEVSIN SL) 0.125 MG SL tablet Place 1 tablet (0.125 mg total) under the tongue every 6 (six) hours as needed for cramping. Patient not taking: Reported on 06/22/2017 05/20/17   Aline August, MD  oxyCODONE (OXY IR/ROXICODONE) 5 MG immediate release tablet Take 1 tablet (5 mg total) by mouth every 4 (four) hours as needed for moderate pain or severe pain (5mg  for moderate pain, 10mg  for severe pain). Patient not taking: Reported on 06/22/2017 05/20/17   Aline August, MD    Physical Exam: Vitals:   06/22/17 1645 06/22/17 1700 06/22/17 1715 06/22/17 1730  BP: 103/67 126/69 121/75 115/71  Pulse: 88 92 91 87  Resp: 17 16 14 15   Temp:      TempSrc:      SpO2: 100% 100% 99% 100%  Weight:      Height:        Constitutional: NAD, calm, comfortable Vitals:   06/22/17 1645 06/22/17 1700 06/22/17 1715 06/22/17 1730  BP: 103/67 126/69 121/75 115/71  Pulse: 88 92 91 87  Resp: 17 16 14 15   Temp:      TempSrc:      SpO2: 100%  100% 99% 100%  Weight:      Height:       Eyes: PERRL, lids and conjunctivae normal ENMT: Mucous membranes are moist. Posterior pharynx clear of any exudate or lesions. Neck: normal, supple, no masses, no thyromegaly Respiratory: clear to auscultation bilaterally, no wheezing, no crackles. Normal respiratory effort. No accessory muscle use.  Cardiovascular: Regular rate and rhythm, no murmurs / rubs / gallops.  Abdomen: no tenderness, no masses palpated. No hepatosplenomegaly. Bowel sounds positive.  Musculoskeletal: no clubbing / cyanosis.  No joint deformity upper and lower extremities.  Skin: no rashes, lesions, ulcers. No induration Neurologic: No facial asymmetry, moves extremities equally Psychiatric: Normal judgment and insight. Mood and affect appropriate.   Labs on Admission: I have personally reviewed following labs and imaging studies  CBC: Recent Labs  Lab 06/22/17 1009 06/22/17 1512  WBC 18.7*  --   HGB 10.6* 10.5*  HCT 32.3* 31.0*  MCV 90.7  --   PLT 705*  --    Basic Metabolic Panel: Recent Labs  Lab 06/22/17 1009 06/22/17 1512  NA 133* 135  K 3.0* 2.6*  CL 97* 98*  CO2 24  --   GLUCOSE 96 90  BUN <5* <3*  CREATININE 0.54* 0.40*  CALCIUM 9.4  --    GFR: Estimated Creatinine Clearance: 73.2 mL/min (A) (by C-G formula based on SCr of 0.4 mg/dL (L)). Liver Function Tests: Recent Labs  Lab 06/22/17 1009  AST 14*  ALT 9*  ALKPHOS 125  BILITOT 0.5  PROT 7.3  ALBUMIN 2.8*   Recent Labs  Lab 06/22/17 1009  LIPASE 22   No results for input(s): AMMONIA in the last 168 hours. Coagulation Profile: No results for input(s): INR, PROTIME in the last 168 hours. Cardiac Enzymes: No results for input(s): CKTOTAL, CKMB, CKMBINDEX, TROPONINI in the last 168 hours. BNP (last 3 results) No results for input(s): PROBNP in the last 8760 hours. HbA1C: No results for input(s): HGBA1C in the last 72 hours. CBG: No results for input(s): GLUCAP in the last 168 hours. Lipid Profile: No results for input(s): CHOL, HDL, LDLCALC, TRIG, CHOLHDL, LDLDIRECT in the last 72 hours. Thyroid Function Tests: No results for input(s): TSH, T4TOTAL, FREET4, T3FREE, THYROIDAB in the last 72 hours. Anemia Panel: No results for input(s): VITAMINB12, FOLATE, FERRITIN, TIBC, IRON, RETICCTPCT in the last 72 hours. Urine analysis:    Component Value Date/Time   COLORURINE YELLOW 06/22/2017 1005   APPEARANCEUR CLOUDY (A) 06/22/2017 1005   LABSPEC 1.006 06/22/2017 1005   PHURINE 6.0 06/22/2017 1005   GLUCOSEU NEGATIVE  06/22/2017 1005   HGBUR MODERATE (A) 06/22/2017 1005   BILIRUBINUR NEGATIVE 06/22/2017 1005   KETONESUR NEGATIVE 06/22/2017 1005   PROTEINUR 30 (A) 06/22/2017 1005   NITRITE NEGATIVE 06/22/2017 1005   LEUKOCYTESUR MODERATE (A) 06/22/2017 1005    Radiological Exams on Admission: Ct Abdomen Pelvis W Contrast  Result Date: 06/22/2017 CLINICAL DATA:  Abdominal pain, fever EXAM: CT ABDOMEN AND PELVIS WITH CONTRAST TECHNIQUE: Multidetector CT imaging of the abdomen and pelvis was performed using the standard protocol following bolus administration of intravenous contrast. CONTRAST:  11mL ISOVUE-300 IOPAMIDOL (ISOVUE-300) INJECTION 61% COMPARISON:  None. FINDINGS: Lower chest: Lung bases are clear. No effusions. Heart is normal size. Hepatobiliary: No focal hepatic abnormality. Gallbladder unremarkable. Pancreas: No focal abnormality or ductal dilatation. Spleen: No focal abnormality. Normal size. There is a small fluid collection adjacent to the lateral aspect of the spleen which may represent subcapsular fluid collection. Adrenals/Urinary Tract: Bladder  wall thickening noted which may be related to cystitis. There is R locules of gas within the bladder, presumably from recent catheterization. No hydronephrosis or focal renal abnormality. Adrenal glands unremarkable. Stomach/Bowel: There appears to be mild wall thickening within the rectosigmoid colon which is somewhat under distended and difficult to evaluate. No evidence of bowel obstruction. Appendix not definitively seen, but no pericecal inflammation. Left lower quadrant colostomy noted. Vascular/Lymphatic: Scattered aortic and iliac calcifications. No evidence of aneurysm or adenopathy. Reproductive: Mildly prominent prostate with central calcifications. Other: No free fluid or free air. Musculoskeletal: No acute bony abnormality or focal bone lesion. IMPRESSION: Diverting colostomy noted in the left lower quadrant. The rectosigmoid colon appears  somewhat thick walled but is decompressed and difficult to evaluate, related to the colostomy. Bladder wall thickening may reflect cystitis or a component of bladder outlet obstruction. Recommend clinical correlation. Small fluid collection lateral to the spleen may reflect subcapsular fluid. No underlying splenic abnormality. This is of unknown etiology or significance. Electronically Signed   By: Rolm Baptise M.D.   On: 06/22/2017 15:44   Dg Chest Port 1 View  Result Date: 06/22/2017 CLINICAL DATA:  57 year old male with increased pain, decreased stool output in ostomy bag status post surgery on 05/13/2017 for treatment of diverticulitis with colocutaneous fistula. EXAM: PORTABLE CHEST 1 VIEW COMPARISON:  05/11/2017 and earlier. FINDINGS: Portable AP upright view at 1431 hours. Resolved patchy right lower lung opacity seen in November. Allowing for portable technique the lungs are clear ; the left lung base is not entirely included. Normal cardiac size and mediastinal contours. Visualized tracheal air column is within normal limits. No acute osseous abnormality identified. IMPRESSION: No acute cardiopulmonary abnormality. Electronically Signed   By: Genevie Ann M.D.   On: 06/22/2017 14:51    EKG: Independently reviewed. Sinus tachycardia with no ST elevations or depressions  Assessment/Plan Active Problems:  Sepsis - Evidenced by soft blood pressures, tachycardia, and leukocytosis - For now we'll cover with broad-spectrum antibiotics and obtain blood and urine cultures - Question is whether there is an intra-abdominal infection or if it's just a urinary tract infection. - obtain culture and gram stain of intra abdominal fluid collection   Hypokalemia - Suspect most likely related to GI losses - Will replace IV and reassess - telemetry monitoring.   Nicotine dependence - smokes 2 ppd, will provide patch and discuss cessation  DVT prophylaxis: heparin Code Status: full Family Communication:  Discussed with patient and family member at bedside Disposition Plan: Telemetry Consults called: General surgery Admission status: inpatient   Velvet Bathe MD [facility] Pager [phone]  If 7PM-7AM, please contact night-coverage [email or web address] Password [facility]  06/22/2017, 6:43 PM

## 2017-06-22 NOTE — Consult Note (Signed)
[facility] Surgery Consult Note  Craig Ionescu Apr 24, 1961  [id number].    Requesting MD: Jeanell Sparrow Chief Complaint/Reason for Consult: abdominal pain  HPI:  Patient is a 57 y/o gentleman s/p sigmoid colectomy and end colostomy 05/13/17 by Dr. Donne Hazel. Presented to [facility] with 2 week hx of abdominal pain. Pain mostly in lower abdomen, intermittent, has progressively worsened over the last 2 weeks. Associated fevers, chills, decreased output from ostomy. Fever up to 101 at home, resolved with tylenol. Patient denies n/v, and has had stool and gas output today. Also reports cloudy and foul-smelling urine for the last 2-3 days. Prior to the last 2 weeks, patient had been doing well pos-operatively. Scheduled to see Dr. Donne Hazel in the office for second follow up appointment this Thursday.   ROS: Review of Systems  Constitutional: Positive for chills and fever.  Respiratory: Negative for cough and shortness of breath.   Cardiovascular: Negative for chest pain and palpitations.  Gastrointestinal: Positive for abdominal pain and constipation. Negative for blood in stool, diarrhea, nausea and vomiting.  Genitourinary: Negative for dysuria, flank pain, frequency and urgency.       Foul smelling, cloudy urine  All other systems reviewed and are negative.   No family history on file.  No past medical history on file.  Past Surgical History:  Procedure Laterality Date  . COLECTOMY WITH COLOSTOMY CREATION/HARTMANN PROCEDURE N/A 05/13/2017   Procedure: COLECTOMY WITH COLOSTOMY CREATION/HARTMANN PROCEDURE;  Surgeon: Rolm Bookbinder, MD;  Location: [place];  Service: General;  Laterality: N/A;  . IR PARACENTESIS  05/05/2017  . IR THORACENTESIS ASP PLEURAL SPACE W/IMG GUIDE  04/23/2017    Social History:  reports that he has been smoking cigarettes.  He has been smoking about 3.00 packs per day. he has never used smokeless tobacco. He reports that he drinks about 10.8 oz of alcohol per week. He  reports that he does not use drugs.  Allergies: No Known Allergies   (Not in a hospital admission)  Blood pressure 109/65, pulse (!) 101, temperature 98.3 F (36.8 C), temperature source Oral, resp. rate 16, height '5\' 9"'$  (1.753 m), weight 49 kg (108 lb), SpO2 100 %. Physical Exam: Physical Exam  Constitutional: He is oriented to person, place, and time. He appears well-developed. He appears cachectic. He is cooperative.  Non-toxic appearance. No distress.  HENT:  Head: Normocephalic and atraumatic.  Right Ear: External ear normal.  Left Ear: External ear normal.  Nose: Nose normal.  Mouth/Throat: Uvula is midline, oropharynx is clear and moist and mucous membranes are normal.  Eyes: Conjunctivae, EOM and lids are normal. Pupils are equal, round, and reactive to light. No scleral icterus.  Neck: Normal range of motion and phonation normal. Neck supple.  Cardiovascular: Regular rhythm. Tachycardia present.  Pulses:      Radial pulses are 2+ on the right side, and 2+ on the left side.       Dorsalis pedis pulses are 2+ on the right side, and 2+ on the left side.  No LE edema BL   Pulmonary/Chest: Effort normal and breath sounds normal. No stridor. He has no decreased breath sounds.  Abdominal: Soft. Bowel sounds are normal. He exhibits no distension. There is no hepatosplenomegaly. There is tenderness in the right lower quadrant, suprapubic area, left upper quadrant and left lower quadrant. There is no rigidity, no rebound, no guarding, no tenderness at McBurney's point and negative Murphy's sign. No hernia.  Midline incision intact and well healing without surrounding erythema or purulence.  Stoma pink with soft brown stool and gas present in ostomy bag.   Musculoskeletal:  ROM grossly intact in BL upper and lower extremities  Neurological: He is alert and oriented to person, place, and time.  Skin: Skin is warm, dry and intact. No rash noted. He is not diaphoretic. No pallor.   Psychiatric: He has a normal mood and affect. His speech is normal and behavior is normal.    Results for orders placed or performed during the hospital encounter of 06/22/17 (from the past 48 hour(s))  Urinalysis, Routine w reflex microscopic     Status: Abnormal   Collection Time: 06/22/17 10:05 AM  Result Value Ref Range   Color, Urine YELLOW YELLOW   APPearance CLOUDY (A) CLEAR   Specific Gravity, Urine 1.006 1.005 - 1.030   pH 6.0 5.0 - 8.0   Glucose, UA NEGATIVE NEGATIVE mg/dL   Hgb urine dipstick MODERATE (A) NEGATIVE   Bilirubin Urine NEGATIVE NEGATIVE   Ketones, ur NEGATIVE NEGATIVE mg/dL   Protein, ur 30 (A) NEGATIVE mg/dL   Nitrite NEGATIVE NEGATIVE   Leukocytes, UA MODERATE (A) NEGATIVE   RBC / HPF 6-30 0 - 5 RBC/hpf   WBC, UA TOO NUMEROUS TO COUNT 0 - 5 WBC/hpf   Bacteria, UA MANY (A) NONE SEEN   Squamous Epithelial / LPF NONE SEEN NONE SEEN   WBC Clumps PRESENT    Mucus PRESENT    Non Squamous Epithelial 6-30 (A) NONE SEEN  Lipase, blood     Status: None   Collection Time: 06/22/17 10:09 AM  Result Value Ref Range   Lipase 22 11 - 51 U/L  Comprehensive metabolic panel     Status: Abnormal   Collection Time: 06/22/17 10:09 AM  Result Value Ref Range   Sodium 133 (L) 135 - 145 mmol/L   Potassium 3.0 (L) 3.5 - 5.1 mmol/L   Chloride 97 (L) 101 - 111 mmol/L   CO2 24 22 - 32 mmol/L   Glucose, Bld 96 65 - 99 mg/dL   BUN <5 (L) 6 - 20 mg/dL   Creatinine, Ser 0.54 (L) 0.61 - 1.24 mg/dL   Calcium 9.4 8.9 - 10.3 mg/dL   Total Protein 7.3 6.5 - 8.1 g/dL   Albumin 2.8 (L) 3.5 - 5.0 g/dL   AST 14 (L) 15 - 41 U/L   ALT 9 (L) 17 - 63 U/L   Alkaline Phosphatase 125 38 - 126 U/L   Total Bilirubin 0.5 0.3 - 1.2 mg/dL   GFR calc non Af Amer >60 >60 mL/min   GFR calc Af Amer >60 >60 mL/min    Comment: (NOTE) The eGFR has been calculated using the CKD EPI equation. This calculation has not been validated in all clinical situations. eGFR's persistently <60 mL/min signify  possible Chronic Kidney Disease.    Anion gap 12 5 - 15  CBC     Status: Abnormal   Collection Time: 06/22/17 10:09 AM  Result Value Ref Range   WBC 18.7 (H) 4.0 - 10.5 K/uL   RBC 3.56 (L) 4.22 - 5.81 MIL/uL   Hemoglobin 10.6 (L) 13.0 - 17.0 g/dL   HCT 32.3 (L) 39.0 - 52.0 %   MCV 90.7 78.0 - 100.0 fL   MCH 29.8 26.0 - 34.0 pg   MCHC 32.8 30.0 - 36.0 g/dL   RDW 15.2 11.5 - 15.5 %   Platelets 705 (H) 150 - 400 K/uL  I-Stat CG4 Lactic Acid, ED     Status: None  Collection Time: 06/22/17 11:05 AM  Result Value Ref Range   Lactic Acid, Venous 1.57 0.5 - 1.9 mmol/L  I-stat chem 8, ed     Status: Abnormal   Collection Time: 06/22/17  3:12 PM  Result Value Ref Range   Sodium 135 135 - 145 mmol/L   Potassium 2.6 (LL) 3.5 - 5.1 mmol/L   Chloride 98 (L) 101 - 111 mmol/L   BUN <3 (L) 6 - 20 mg/dL   Creatinine, Ser 0.40 (L) 0.61 - 1.24 mg/dL   Glucose, Bld 90 65 - 99 mg/dL   Calcium, Ion 1.12 (L) 1.15 - 1.40 mmol/L   TCO2 26 22 - 32 mmol/L   Hemoglobin 10.5 (L) 13.0 - 17.0 g/dL   HCT 31.0 (L) 39.0 - 52.0 %   Comment NOTIFIED PHYSICIAN   I-Stat CG4 Lactic Acid, ED     Status: None   Collection Time: 06/22/17  3:41 PM  Result Value Ref Range   Lactic Acid, Venous 1.02 0.5 - 1.9 mmol/L   Ct Abdomen Pelvis W Contrast  Result Date: 06/22/2017 CLINICAL DATA:  Abdominal pain, fever EXAM: CT ABDOMEN AND PELVIS WITH CONTRAST TECHNIQUE: Multidetector CT imaging of the abdomen and pelvis was performed using the standard protocol following bolus administration of intravenous contrast. CONTRAST:  162m ISOVUE-300 IOPAMIDOL (ISOVUE-300) INJECTION 61% COMPARISON:  None. FINDINGS: Lower chest: Lung bases are clear. No effusions. Heart is normal size. Hepatobiliary: No focal hepatic abnormality. Gallbladder unremarkable. Pancreas: No focal abnormality or ductal dilatation. Spleen: No focal abnormality. Normal size. There is a small fluid collection adjacent to the lateral aspect of the spleen which may  represent subcapsular fluid collection. Adrenals/Urinary Tract: Bladder wall thickening noted which may be related to cystitis. There is R locules of gas within the bladder, presumably from recent catheterization. No hydronephrosis or focal renal abnormality. Adrenal glands unremarkable. Stomach/Bowel: There appears to be mild wall thickening within the rectosigmoid colon which is somewhat under distended and difficult to evaluate. No evidence of bowel obstruction. Appendix not definitively seen, but no pericecal inflammation. Left lower quadrant colostomy noted. Vascular/Lymphatic: Scattered aortic and iliac calcifications. No evidence of aneurysm or adenopathy. Reproductive: Mildly prominent prostate with central calcifications. Other: No free fluid or free air. Musculoskeletal: No acute bony abnormality or focal bone lesion. IMPRESSION: Diverting colostomy noted in the left lower quadrant. The rectosigmoid colon appears somewhat thick walled but is decompressed and difficult to evaluate, related to the colostomy. Bladder wall thickening may reflect cystitis or a component of bladder outlet obstruction. Recommend clinical correlation. Small fluid collection lateral to the spleen may reflect subcapsular fluid. No underlying splenic abnormality. This is of unknown etiology or significance. Electronically Signed   By: KRolm BaptiseM.D.   On: 06/22/2017 15:44   Dg Chest Port 1 View  Result Date: 06/22/2017 CLINICAL DATA:  57year old male with increased pain, decreased stool output in ostomy bag status post surgery on 05/13/2017 for treatment of diverticulitis with colocutaneous fistula. EXAM: PORTABLE CHEST 1 VIEW COMPARISON:  05/11/2017 and earlier. FINDINGS: Portable AP upright view at 1431 hours. Resolved patchy right lower lung opacity seen in November. Allowing for portable technique the lungs are clear ; the left lung base is not entirely included. Normal cardiac size and mediastinal contours. Visualized  tracheal air column is within normal limits. No acute osseous abnormality identified. IMPRESSION: No acute cardiopulmonary abnormality. Electronically Signed   By: HGenevie AnnM.D.   On: 06/22/2017 14:51      Assessment/Plan Abdominal pain -  possibly related to UTI vs splenic fluid collection or both  UTI - UA suggestive of UTI, clinically patient reports foul cloudy urine  - recommend abx and following WBC S/P sigmoid colectomy with end colostomy - 05/13/17 MW Splenic fluid collection  - recommend IR consult for percutaneous drainage  FEN: fine to have clears from our standpoint, but would leave NPO if consulting IR  Recommend IR consult for splenic fluid collection and treatment of UTI. Nothing requiring emergent surgical intervention. We will follow along with you.   Brigid Re, [facility] Surgery 06/22/2017, 4:02 PM Pager: [phone] Consults: [phone] Mon-Fri 7:00 am-4:30 pm Sat-Sun 7:00 am-11:30 am

## 2017-06-22 NOTE — Progress Notes (Signed)
Pharmacy Antibiotic Note  Dominic Phillips is a 57 y.o. male admitted on 06/22/2017 with sepsis 2/2 intra-abdominal infection  Pharmacy has been consulted for vancomycin/zosyn dosing. Afebrile. Tachypneic with RR 21. Elevated WBC at 18.7, Scr WNL.  Plan: Vancomycin 1g IV x1 Vancomycin 500mg  IV every 12 hours. Goal trough 15-20 mcg/mL. Zosyn 3.375g IV x1 (per EDP) Zosyn 3.375g IV every 8 hours (4-hour infusion) F/u vanc trough at steady state, renal fxn, and clinical resolution  Height: 5\' 9"  (175.3 cm) Weight: 108 lb (49 kg) IBW/kg (Calculated) : 70.7  Temp (24hrs), Avg:98.2 F (36.8 C), Min:98 F (36.7 C), Max:98.3 F (36.8 C)  Recent Labs  Lab 06/22/17 1009 06/22/17 1105 06/22/17 1512 06/22/17 1541  WBC 18.7*  --   --   --   CREATININE 0.54*  --  0.40*  --   LATICACIDVEN  --  1.57  --  1.02    Estimated Creatinine Clearance: 73.2 mL/min (A) (by C-G formula based on SCr of 0.4 mg/dL (L)).    No Known Allergies  Antimicrobials this admission: Vanc 1/7 >>  Zosyn 1/7 >>   Microbiology results: Pending  Thank you for allowing pharmacy to be a part of this patient's care.  Cristhian Vanhook 06/22/2017 7:23 PM

## 2017-06-22 NOTE — ED Provider Notes (Signed)
[facility] EMERGENCY DEPARTMENT Provider Note   CSN: [id number] Arrival date & time: 06/22/17  3329     History   Chief Complaint Chief Complaint  Patient presents with  . Post-op Problem    HPI Dominic Phillips is a 57 y.o. male.  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  Patient had complicated hospital course after initial presentation with diverticulitis and eventually had a colectomy and ostomy on 1128.  Respiratory failure with ARDS and was intubated.  He has a history of alcohol abuse but denies alcohol use since discharge.  Discharged from hospital on 05/19/2017.  He has had one follow-up with Dr. Donne Hazel.  He states that over the past couple of weeks he has had intermittent abdominal pain.  He now states he has worsening abdominal pain today with decreased ostomy output.  He has had subjective fevers.  He has had nausea and vomiting.  He states his urine output is cloudy and smells bad.  He denies dyspnea or cough.  Reports taking medications as prescribed.  He has been eating and reports that his weight has been stable. No past medical history on file.  Patient Active Problem List   Diagnosis Date Noted  . Routine adult health maintenance   . History of ETT   . Pleural effusion   . Encounter for imaging study to confirm orogastric (OG) tube placement   . Endotracheally intubated   . Acute respiratory failure with hypoxia ([place])   . Palliative care by specialist   . Dyspnea   . HCAP (healthcare-associated pneumonia)   . Acute blood loss anemia   . Anemia of chronic disease   . ETOH abuse   . Tobacco abuse   . Tachycardia   . Tachypnea   . Leukocytosis   . Post-operative pain   . Pressure injury of skin 04/19/2017  . Colonic diverticular abscess 04/19/2017  . COPD (chronic obstructive  pulmonary disease) ([place]) 04/19/2017  . Acute kidney injury ([place])   . Acute respiratory distress syndrome (ARDS) ([place]) 04/11/2017    Past Surgical History:  Procedure Laterality Date  . COLECTOMY WITH COLOSTOMY CREATION/HARTMANN PROCEDURE N/A 05/13/2017   Procedure: COLECTOMY WITH COLOSTOMY CREATION/HARTMANN PROCEDURE;  Surgeon: Rolm Bookbinder, MD;  Location: [facility];  Service: General;  Laterality: N/A;  . IR PARACENTESIS  05/05/2017  . IR THORACENTESIS ASP PLEURAL SPACE W/IMG GUIDE  04/23/2017       Home Medications    Prior to Admission medications   Medication Sig Start Date End Date Taking? Authorizing Provider  acetaminophen (TYLENOL) 325 MG tablet Take 2 tablets (650 mg total) by mouth every 6 (six) hours. 05/19/17   Domenic Polite, MD  hyoscyamine (LEVSIN SL) 0.125 MG SL tablet Place 1 tablet (0.125 mg total) under the tongue every 6 (six) hours as needed for cramping. 05/20/17   Aline August, MD  oxyCODONE (OXY IR/ROXICODONE) 5 MG immediate release tablet Take 1 tablet (5 mg total) by mouth every 4 (four) hours as needed for moderate pain or severe pain (5mg  for moderate pain, 10mg  for severe pain). 05/20/17   Aline August, MD    Family History No family history on file.  Social History Social History   Tobacco Use  . Smoking status: Current Every Day Smoker    Packs/day: 3.00    Types: Cigarettes  . Smokeless tobacco: Never Used  Substance Use Topics  .  Alcohol use: Yes    Alcohol/week: 10.8 oz    Types: 18 Cans of beer per week  . Drug use: No     Allergies   Patient has no known allergies.   Review of Systems Review of Systems  Constitutional: Positive for fatigue.  HENT: Negative.   Eyes: Negative.   Respiratory: Negative.   Cardiovascular: Negative.   Gastrointestinal: Positive for abdominal pain, constipation, nausea and vomiting.  Endocrine: Negative.   Genitourinary:       Discolored urination with foul odor  Musculoskeletal: Negative.     Allergic/Immunologic: Negative.   Neurological: Negative.   Hematological: Negative.   Psychiatric/Behavioral: Negative.   All other systems reviewed and are negative.    Physical Exam Updated Vital Signs BP 95/61 (BP Location: Right Arm)   Pulse (!) 104   Temp 98.3 F (36.8 C) (Oral)   Resp 16   SpO2 100%   Physical Exam  Constitutional: He is oriented to person, place, and time. He appears well-developed and well-nourished.  HENT:  Head: Normocephalic and atraumatic.  Right Ear: External ear normal.  Left Ear: External ear normal.  Mouth/Throat: Oropharynx is clear and moist.  Neck: Normal range of motion.  Cardiovascular: Normal rate and regular rhythm.  Pulmonary/Chest: Effort normal and breath sounds normal.  Abdominal:  Ostomy site with some stool in bag.  No surrounding erythema.  Mild diffuse tenderness to palpation with decreased bowel sounds.  Genitourinary: Penis normal.  Musculoskeletal: Normal range of motion.  Neurological: He is alert and oriented to person, place, and time.  Skin: Skin is warm. Capillary refill takes less than 2 seconds.  Psychiatric: He has a normal mood and affect.  Nursing note and vitals reviewed.    ED Treatments / Results  Labs (all labs ordered are listed, but only abnormal results are displayed) Labs Reviewed  COMPREHENSIVE METABOLIC PANEL - Abnormal; Notable for the following components:      Result Value   Sodium 133 (*)    Potassium 3.0 (*)    Chloride 97 (*)    BUN <5 (*)    Creatinine, Ser 0.54 (*)    Albumin 2.8 (*)    AST 14 (*)    ALT 9 (*)    All other components within normal limits  CBC - Abnormal; Notable for the following components:   WBC 18.7 (*)    RBC 3.56 (*)    Hemoglobin 10.6 (*)    HCT 32.3 (*)    Platelets 705 (*)    All other components within normal limits  URINALYSIS, ROUTINE W REFLEX MICROSCOPIC - Abnormal; Notable for the following components:   APPearance CLOUDY (*)    Hgb urine  dipstick MODERATE (*)    Protein, ur 30 (*)    Leukocytes, UA MODERATE (*)    Bacteria, UA MANY (*)    Non Squamous Epithelial 6-30 (*)    All other components within normal limits  LIPASE, BLOOD  I-STAT CG4 LACTIC ACID, ED  I-STAT CG4 LACTIC ACID, ED    EKG  EKG Interpretation None       Radiology No results found.  Procedures Procedures (including critical care time)  Medications Ordered in ED Medications - No data to display   Initial Impression / Assessment and Plan / ED Course  I have reviewed the triage vital signs and the nursing notes.  Pertinent labs & imaging results that were available during my care of the patient were reviewed by me and considered  in my medical decision making (see chart for details).   CT c.w. Uti. Urine with tntc wbc, no definitive evidence of colostomy or bowel involvement.  Discussed with general surgey, who will see patient in consult.   Dr. Vanita Panda assumed care and dispositioned admission to hospitalist   Final Clinical Impressions(s) / ED Diagnoses   Final diagnoses:  Intraabdominal fluid collection  Intra-abdominal fluid collection    ED Discharge Orders    None       Pattricia Boss, MD 06/23/17 [phone]

## 2017-06-22 NOTE — ED Notes (Signed)
Patient transported to CT 

## 2017-06-23 DIAGNOSIS — N390 Urinary tract infection, site not specified: Secondary | ICD-10-CM

## 2017-06-23 DIAGNOSIS — A419 Sepsis, unspecified organism: Principal | ICD-10-CM

## 2017-06-23 DIAGNOSIS — D72829 Elevated white blood cell count, unspecified: Secondary | ICD-10-CM

## 2017-06-23 LAB — COMPREHENSIVE METABOLIC PANEL
ALT: 11 U/L — ABNORMAL LOW (ref 17–63)
ANION GAP: 9 (ref 5–15)
AST: 20 U/L (ref 15–41)
Albumin: 2.1 g/dL — ABNORMAL LOW (ref 3.5–5.0)
Alkaline Phosphatase: 107 U/L (ref 38–126)
BUN: 8 mg/dL (ref 6–20)
CHLORIDE: 101 mmol/L (ref 101–111)
CO2: 25 mmol/L (ref 22–32)
Calcium: 8.6 mg/dL — ABNORMAL LOW (ref 8.9–10.3)
Creatinine, Ser: 0.58 mg/dL — ABNORMAL LOW (ref 0.61–1.24)
Glucose, Bld: 226 mg/dL — ABNORMAL HIGH (ref 65–99)
POTASSIUM: 2.3 mmol/L — AB (ref 3.5–5.1)
Sodium: 135 mmol/L (ref 135–145)
TOTAL PROTEIN: 6.3 g/dL — AB (ref 6.5–8.1)
Total Bilirubin: 0.4 mg/dL (ref 0.3–1.2)

## 2017-06-23 LAB — CBC WITH DIFFERENTIAL/PLATELET
BASOS ABS: 0 10*3/uL (ref 0.0–0.1)
Basophils Relative: 0 %
EOS PCT: 1 %
Eosinophils Absolute: 0.1 10*3/uL (ref 0.0–0.7)
HEMATOCRIT: 29.3 % — AB (ref 39.0–52.0)
Hemoglobin: 9.6 g/dL — ABNORMAL LOW (ref 13.0–17.0)
LYMPHS PCT: 10 %
Lymphs Abs: 1.3 10*3/uL (ref 0.7–4.0)
MCH: 29.6 pg (ref 26.0–34.0)
MCHC: 32.8 g/dL (ref 30.0–36.0)
MCV: 90.4 fL (ref 78.0–100.0)
MONO ABS: 0.9 10*3/uL (ref 0.1–1.0)
MONOS PCT: 7 %
Neutro Abs: 11 10*3/uL — ABNORMAL HIGH (ref 1.7–7.7)
Neutrophils Relative %: 82 %
PLATELETS: 639 10*3/uL — AB (ref 150–400)
RBC: 3.24 MIL/uL — ABNORMAL LOW (ref 4.22–5.81)
RDW: 15 % (ref 11.5–15.5)
WBC: 13.3 10*3/uL — ABNORMAL HIGH (ref 4.0–10.5)

## 2017-06-23 LAB — PHOSPHORUS: PHOSPHORUS: 3.3 mg/dL (ref 2.5–4.6)

## 2017-06-23 LAB — MAGNESIUM: MAGNESIUM: 1.8 mg/dL (ref 1.7–2.4)

## 2017-06-23 LAB — MRSA PCR SCREENING: MRSA BY PCR: NEGATIVE

## 2017-06-23 MED ORDER — POTASSIUM CHLORIDE 10 MEQ/100ML IV SOLN
10.0000 meq | INTRAVENOUS | Status: AC
  Administered 2017-06-23 – 2017-06-24 (×4): 10 meq via INTRAVENOUS
  Filled 2017-06-23 (×3): qty 100

## 2017-06-23 MED ORDER — IBUPROFEN 200 MG PO TABS
600.0000 mg | ORAL_TABLET | Freq: Four times a day (QID) | ORAL | Status: DC | PRN
  Administered 2017-06-23 – 2017-06-24 (×4): 600 mg via ORAL
  Filled 2017-06-23 (×4): qty 3

## 2017-06-23 MED ORDER — ONDANSETRON HCL 4 MG/2ML IJ SOLN: 4.0000 mg | Freq: Four times a day (QID) | INTRAMUSCULAR | Status: DC | PRN

## 2017-06-23 MED ORDER — ACETAMINOPHEN 325 MG PO TABS
650.0000 mg | ORAL_TABLET | Freq: Four times a day (QID) | ORAL | Status: DC | PRN
  Administered 2017-06-24: 650 mg via ORAL
  Filled 2017-06-23: qty 2

## 2017-06-23 MED ORDER — ONDANSETRON HCL 4 MG PO TABS: 4.0000 mg | ORAL_TABLET | Freq: Four times a day (QID) | ORAL | Status: DC | PRN

## 2017-06-23 MED ORDER — POTASSIUM CHLORIDE CRYS ER 20 MEQ PO TBCR
40.0000 meq | EXTENDED_RELEASE_TABLET | Freq: Three times a day (TID) | ORAL | Status: DC
  Administered 2017-06-23 – 2017-06-24 (×3): 40 meq via ORAL
  Filled 2017-06-23 (×3): qty 2

## 2017-06-23 MED ORDER — KCL IN DEXTROSE-NACL 20-5-0.45 MEQ/L-%-% IV SOLN
INTRAVENOUS | Status: DC
  Administered 2017-06-23 – 2017-06-24 (×3): via INTRAVENOUS
  Filled 2017-06-23 (×3): qty 1000

## 2017-06-23 MED ORDER — ENSURE ENLIVE PO LIQD
237.0000 mL | Freq: Two times a day (BID) | ORAL | Status: DC
  Administered 2017-06-23 – 2017-06-24 (×4): 237 mL via ORAL

## 2017-06-23 MED ORDER — HEPARIN SODIUM (PORCINE) 5000 UNIT/ML IJ SOLN
5000.0000 [IU] | Freq: Three times a day (TID) | INTRAMUSCULAR | Status: DC
  Administered 2017-06-23 – 2017-06-24 (×3): 5000 [IU] via SUBCUTANEOUS
  Filled 2017-06-23 (×3): qty 1

## 2017-06-23 NOTE — Progress Notes (Addendum)
Subjective/Chief Complaint: Sore today, urine clear in bottle, hungry, wants to know plan   Objective: Vital signs in last 24 hours: Temp:  [98 F (36.7 C)-99.3 F (37.4 C)] 98.3 F (36.8 C) (01/08 0312) Pulse Rate:  [86-112] 97 (01/08 0312) Resp:  [10-24] 18 (01/08 0312) BP: (85-126)/(58-93) 122/67 (01/08 0312) SpO2:  [96 %-100 %] 98 % (01/08 0312) Weight:  [46.8 kg (103 lb 2.8 oz)-49 kg (108 lb)] 46.8 kg (103 lb 2.8 oz) (01/07 2200) Last BM Date: 06/22/17  Intake/Output from previous day: 01/07 0701 - 01/08 0700 In: 50 [IV Piggyback:50] Out: 250 [Urine:250] Intake/Output this shift: No intake/output data recorded.  GI: stoma functional, wound clean, soft nontender  Lab Results:  Recent Labs    06/22/17 1009 06/22/17 1512  WBC 18.7*  --   HGB 10.6* 10.5*  HCT 32.3* 31.0*  PLT 705*  --    BMET Recent Labs    06/22/17 1009 06/22/17 1512  NA 133* 135  K 3.0* 2.6*  CL 97* 98*  CO2 24  --   GLUCOSE 96 90  BUN <5* <3*  CREATININE 0.54* 0.40*  CALCIUM 9.4  --    PT/INR No results for input(s): LABPROT, INR in the last 72 hours. ABG No results for input(s): PHART, HCO3 in the last 72 hours.  Invalid input(s): PCO2, PO2  Studies/Results: Ct Abdomen Pelvis W Contrast  Result Date: 06/22/2017 CLINICAL DATA:  Abdominal pain, fever EXAM: CT ABDOMEN AND PELVIS WITH CONTRAST TECHNIQUE: Multidetector CT imaging of the abdomen and pelvis was performed using the standard protocol following bolus administration of intravenous contrast. CONTRAST:  11mL ISOVUE-300 IOPAMIDOL (ISOVUE-300) INJECTION 61% COMPARISON:  None. FINDINGS: Lower chest: Lung bases are clear. No effusions. Heart is normal size. Hepatobiliary: No focal hepatic abnormality. Gallbladder unremarkable. Pancreas: No focal abnormality or ductal dilatation. Spleen: No focal abnormality. Normal size. There is a small fluid collection adjacent to the lateral aspect of the spleen which may represent subcapsular  fluid collection. Adrenals/Urinary Tract: Bladder wall thickening noted which may be related to cystitis. There is R locules of gas within the bladder, presumably from recent catheterization. No hydronephrosis or focal renal abnormality. Adrenal glands unremarkable. Stomach/Bowel: There appears to be mild wall thickening within the rectosigmoid colon which is somewhat under distended and difficult to evaluate. No evidence of bowel obstruction. Appendix not definitively seen, but no pericecal inflammation. Left lower quadrant colostomy noted. Vascular/Lymphatic: Scattered aortic and iliac calcifications. No evidence of aneurysm or adenopathy. Reproductive: Mildly prominent prostate with central calcifications. Other: No free fluid or free air. Musculoskeletal: No acute bony abnormality or focal bone lesion. IMPRESSION: Diverting colostomy noted in the left lower quadrant. The rectosigmoid colon appears somewhat thick walled but is decompressed and difficult to evaluate, related to the colostomy. Bladder wall thickening may reflect cystitis or a component of bladder outlet obstruction. Recommend clinical correlation. Small fluid collection lateral to the spleen may reflect subcapsular fluid. No underlying splenic abnormality. This is of unknown etiology or significance. Electronically Signed   By: Rolm Baptise M.D.   On: 06/22/2017 15:44   Dg Chest Port 1 View  Result Date: 06/22/2017 CLINICAL DATA:  57 year old male with increased pain, decreased stool output in ostomy bag status post surgery on 05/13/2017 for treatment of diverticulitis with colocutaneous fistula. EXAM: PORTABLE CHEST 1 VIEW COMPARISON:  05/11/2017 and earlier. FINDINGS: Portable AP upright view at 1431 hours. Resolved patchy right lower lung opacity seen in November. Allowing for portable technique the lungs are  clear ; the left lung base is not entirely included. Normal cardiac size and mediastinal contours. Visualized tracheal air column is  within normal limits. No acute osseous abnormality identified. IMPRESSION: No acute cardiopulmonary abnormality. Electronically Signed   By: Genevie Ann M.D.   On: 06/22/2017 14:51    Anti-infectives: Anti-infectives (From admission, onward)   Start     Dose/Rate Route Frequency Ordered Stop   06/23/17 0530  vancomycin (VANCOCIN) 500 mg in sodium chloride 0.9 % 100 mL IVPB     500 mg 100 mL/hr over 60 Minutes Intravenous Every 12 hours 06/22/17 1927     06/23/17 0100  piperacillin-tazobactam (ZOSYN) IVPB 3.375 g     3.375 g 12.5 mL/hr over 240 Minutes Intravenous Every 8 hours 06/22/17 1927     06/22/17 1515  piperacillin-tazobactam (ZOSYN) IVPB 3.375 g     3.375 g 100 mL/hr over 30 Minutes Intravenous  Once 06/22/17 1514 06/22/17 1734   06/22/17 1515  vancomycin (VANCOCIN) IVPB 1000 mg/200 mL premix     1,000 mg 200 mL/hr over 60 Minutes Intravenous  Once 06/22/17 1514 06/22/17 1738      Assessment/Plan: S/p hartmanns for diverticular disease  - I think he likely just has cystitis, question long term would be is there something driving this from his remaining rectum. For now would just treat this episode and can see how he does -he can be discharged when medicine ready -he only needs motrin/tylenol at home at this point, he does not need narcotic rx, I discussed this plan with him -not on pharm dvt proph, if he is staying in hospital can start lovenox from my standpoint and would recommend this, he does not need any ir procedure -I will see him in 2-3 weeks in office  Rolm Bookbinder 06/23/2017

## 2017-06-23 NOTE — Progress Notes (Signed)
CRITICAL VALUE ALERT  Critical Value: K 2.3  Date & Time Notied:  06/23/17 1415  Provider Notified: Alfredia Ferguson, MD  Orders Received/Actions taken: MD made aware of continuous fluids with K infusing and that pt able to take POs well.

## 2017-06-23 NOTE — Progress Notes (Signed)
PROGRESS NOTE    Dominic Phillips  GMW:[id number] DOB: [birth year] DOA: 06/22/2017 PCP: Patient, No Pcp Per   Brief Narrative:  Dominic Phillips is a 57 y.o. 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 Donne Hazel. He is presenting with complaints of abdominal discomfort which has been ongoing for more than 1 week. This is been persistent nothing he is aware of makes it better. Abdominal pain is located in his lower abdomen and radiates from right to left. Problem has been persistent and gradually getting worse. Patient also endorses fevers. He also reports not noticing much output lately into his colostomy bag. Otherwise reports no other complaints Patient was found to be hypokalemic, soft blood pressures, with leukocytosis and tachycardia and tachypnea. They have consulted general surgery and we were subsequently consulted for further evaluation recommendations. Admitted for Sepsis 2/2 to Suspected UTI.   Assessment & Plan:   Active Problems:   Leukocytosis   Hypokalemia  Sepsis likely from UTI -Evidenced by soft blood pressures, tachycardia, and leukocytosis on Admission -For now we'll cover with broad-spectrum antibiotics and obtain blood and urine cultures -Blood Cx showing NGTD <24 hours x 2; Urine Cx pending  - Question was whether there is an intra-abdominal infection or if it's just a urinary tract infection but feel like its a UTI after Surgery evaluated  -Urinalysis indicitive of UTI -WBC improved from 18.7 - 13.3 -Otain culture and gram stain of intra abdominal fluid collection -C/w Antiemetics and fluids -Follow Cx Data repeat CBC in AM   Hypokalemia -Suspect most likely related to GI losses -Will replace IV and po and reassess -C/w Telemetry monitoring.   Nicotine Dependence -smokes 2 ppd, will provide patch and discuss cessation  DVT prophylaxis: Heparin 5,000 units sq q8h Code Status: FULL CODE Family Communication: No  family present at bedside Disposition Plan: Get PT eval;   Consultants:   General Surgery   Procedures: None   Antimicrobials:  Anti-infectives (From admission, onward)   Start     Dose/Rate Route Frequency Ordered Stop   06/23/17 0530  vancomycin (VANCOCIN) 500 mg in sodium chloride 0.9 % 100 mL IVPB     500 mg 100 mL/hr over 60 Minutes Intravenous Every 12 hours 06/22/17 1927     06/23/17 0100  piperacillin-tazobactam (ZOSYN) IVPB 3.375 g     3.375 g 12.5 mL/hr over 240 Minutes Intravenous Every 8 hours 06/22/17 1927     06/22/17 1515  piperacillin-tazobactam (ZOSYN) IVPB 3.375 g     3.375 g 100 mL/hr over 30 Minutes Intravenous  Once 06/22/17 1514 06/22/17 1734   06/22/17 1515  vancomycin (VANCOCIN) IVPB 1000 mg/200 mL premix     1,000 mg 200 mL/hr over 60 Minutes Intravenous  Once 06/22/17 1514 06/22/17 1738     Subjective: Seen and examined and stated he was feeling better. Asking when he could go home. No CP but Abdomen was sore. No nausea or vomiting.   Objective: Vitals:   06/22/17 2200 06/23/17 0026 06/23/17 0312 06/23/17 0815  BP:  125/70 122/67   Pulse:  93 97   Resp:  (!) 23 18   Temp:  98.6 F (37 C) 98.3 F (36.8 C) 99 F (37.2 C)  TempSrc:  Oral Oral   SpO2:  96% 98%   Weight: 46.8 kg (103 lb 2.8 oz)     Height: 5\' 9"  (1.753 m)       Intake/Output Summary (Last 24 hours) at 06/23/2017 0836 Last  data filed at 06/23/2017 0320 Gross per 24 hour  Intake 50 ml  Output 250 ml  Net -200 ml   Filed Weights   06/22/17 1505 06/22/17 2200  Weight: 49 kg (108 lb) 46.8 kg (103 lb 2.8 oz)   Examination: Physical Exam:  Constitutional: Thin cachectic chronically appearing Caucasian male in  NAD and appears calm and comfortable Eyes: Lids and conjunctivae normal, sclerae anicteric  ENMT: External Ears, Nose appear normal. Grossly normal hearing. Mucous membranes are a little dry.  Neck: Appears normal, supple, no cervical masses, normal ROM, no appreciable  thyromegaly, no JVD Respiratory: Diminisehd to auscultation bilaterally, no wheezing, rales, rhonchi or crackles. Normal respiratory effort and patient is not tachypenic. No accessory muscle use.  Cardiovascular: RRR, no murmurs / rubs / gallops. S1 and S2 auscultated. No extremity edema.  Abdomen: Soft, tender in Lower Abdomen, non-distended. No masses palpated. No appreciable hepatosplenomegaly. Bowel sounds positive. Has colostomy bag full of brown liquid stool GU: Deferred. Musculoskeletal: No clubbing / cyanosis of digits/nails. No joint deformity upper and lower extremities.   Skin: No rashes on a limited skin eval. No induration; Warm and dry.  Neurologic: CN 2-12 grossly intact with no focal deficits. Sensation intact in all 4 Extremities, DTR normal. Strength 5/5 in all 4. Romberg sign cerebellar reflexes not assessed.  Psychiatric: Normal judgment and insight. Alert and oriented x 3. Normal mood and appropriate affect.   Data Reviewed: I have personally reviewed following labs and imaging studies  CBC: Recent Labs  Lab 06/22/17 1009 06/22/17 1512  WBC 18.7*  --   HGB 10.6* 10.5*  HCT 32.3* 31.0*  MCV 90.7  --   PLT 705*  --    Basic Metabolic Panel: Recent Labs  Lab 06/22/17 1009 06/22/17 1512  NA 133* 135  K 3.0* 2.6*  CL 97* 98*  CO2 24  --   GLUCOSE 96 90  BUN <5* <3*  CREATININE 0.54* 0.40*  CALCIUM 9.4  --    GFR: Estimated Creatinine Clearance: 69.9 mL/min (A) (by C-G formula based on SCr of 0.4 mg/dL (L)). Liver Function Tests: Recent Labs  Lab 06/22/17 1009  AST 14*  ALT 9*  ALKPHOS 125  BILITOT 0.5  PROT 7.3  ALBUMIN 2.8*   Recent Labs  Lab 06/22/17 1009  LIPASE 22   No results for input(s): AMMONIA in the last 168 hours. Coagulation Profile: No results for input(s): INR, PROTIME in the last 168 hours. Cardiac Enzymes: No results for input(s): CKTOTAL, CKMB, CKMBINDEX, TROPONINI in the last 168 hours. BNP (last 3 results) No results for  input(s): PROBNP in the last 8760 hours. HbA1C: No results for input(s): HGBA1C in the last 72 hours. CBG: No results for input(s): GLUCAP in the last 168 hours. Lipid Profile: No results for input(s): CHOL, HDL, LDLCALC, TRIG, CHOLHDL, LDLDIRECT in the last 72 hours. Thyroid Function Tests: No results for input(s): TSH, T4TOTAL, FREET4, T3FREE, THYROIDAB in the last 72 hours. Anemia Panel: No results for input(s): VITAMINB12, FOLATE, FERRITIN, TIBC, IRON, RETICCTPCT in the last 72 hours. Sepsis Labs: Recent Labs  Lab 06/22/17 1105 06/22/17 1541  LATICACIDVEN 1.57 1.02    Recent Results (from the past 240 hour(s))  MRSA PCR Screening     Status: None   Collection Time: 06/22/17 10:30 PM  Result Value Ref Range Status   MRSA by PCR NEGATIVE NEGATIVE Final    Comment:        The GeneXpert MRSA Assay (FDA approved for NASAL  specimens only), is one component of a comprehensive MRSA colonization surveillance program. It is not intended to diagnose MRSA infection nor to guide or monitor treatment for MRSA infections.     Radiology Studies: Ct Abdomen Pelvis W Contrast  Result Date: 06/22/2017 CLINICAL DATA:  Abdominal pain, fever EXAM: CT ABDOMEN AND PELVIS WITH CONTRAST TECHNIQUE: Multidetector CT imaging of the abdomen and pelvis was performed using the standard protocol following bolus administration of intravenous contrast. CONTRAST:  133mL ISOVUE-300 IOPAMIDOL (ISOVUE-300) INJECTION 61% COMPARISON:  None. FINDINGS: Lower chest: Lung bases are clear. No effusions. Heart is normal size. Hepatobiliary: No focal hepatic abnormality. Gallbladder unremarkable. Pancreas: No focal abnormality or ductal dilatation. Spleen: No focal abnormality. Normal size. There is a small fluid collection adjacent to the lateral aspect of the spleen which may represent subcapsular fluid collection. Adrenals/Urinary Tract: Bladder wall thickening noted which may be related to cystitis. There is R locules  of gas within the bladder, presumably from recent catheterization. No hydronephrosis or focal renal abnormality. Adrenal glands unremarkable. Stomach/Bowel: There appears to be mild wall thickening within the rectosigmoid colon which is somewhat under distended and difficult to evaluate. No evidence of bowel obstruction. Appendix not definitively seen, but no pericecal inflammation. Left lower quadrant colostomy noted. Vascular/Lymphatic: Scattered aortic and iliac calcifications. No evidence of aneurysm or adenopathy. Reproductive: Mildly prominent prostate with central calcifications. Other: No free fluid or free air. Musculoskeletal: No acute bony abnormality or focal bone lesion. IMPRESSION: Diverting colostomy noted in the left lower quadrant. The rectosigmoid colon appears somewhat thick walled but is decompressed and difficult to evaluate, related to the colostomy. Bladder wall thickening may reflect cystitis or a component of bladder outlet obstruction. Recommend clinical correlation. Small fluid collection lateral to the spleen may reflect subcapsular fluid. No underlying splenic abnormality. This is of unknown etiology or significance. Electronically Signed   By: Rolm Baptise M.D.   On: 06/22/2017 15:44   Dg Chest Port 1 View  Result Date: 06/22/2017 CLINICAL DATA:  57 year old male with increased pain, decreased stool output in ostomy bag status post surgery on 05/13/2017 for treatment of diverticulitis with colocutaneous fistula. EXAM: PORTABLE CHEST 1 VIEW COMPARISON:  05/11/2017 and earlier. FINDINGS: Portable AP upright view at 1431 hours. Resolved patchy right lower lung opacity seen in November. Allowing for portable technique the lungs are clear ; the left lung base is not entirely included. Normal cardiac size and mediastinal contours. Visualized tracheal air column is within normal limits. No acute osseous abnormality identified. IMPRESSION: No acute cardiopulmonary abnormality. Electronically  Signed   By: Genevie Ann M.D.   On: 06/22/2017 14:51   Scheduled Meds: . feeding supplement (ENSURE ENLIVE)  237 mL Oral BID BM  . nicotine  21 mg Transdermal Daily   Continuous Infusions: . piperacillin-tazobactam (ZOSYN)  IV Stopped (06/23/17 0452)  . vancomycin 500 mg (06/23/17 0521)    LOS: 1 day   Kerney Elbe, DO [facility] Pager [phone]  If 7PM-7AM, please contact night-coverage [email or web address] Password [facility] 06/23/2017, 8:36 AM

## 2017-06-24 DIAGNOSIS — N390 Urinary tract infection, site not specified: Secondary | ICD-10-CM

## 2017-06-24 LAB — COMPREHENSIVE METABOLIC PANEL
ALBUMIN: 2.1 g/dL — AB (ref 3.5–5.0)
ALT: 11 U/L — ABNORMAL LOW (ref 17–63)
ANION GAP: 6 (ref 5–15)
AST: 15 U/L (ref 15–41)
Alkaline Phosphatase: 104 U/L (ref 38–126)
BILIRUBIN TOTAL: 0.5 mg/dL (ref 0.3–1.2)
BUN: 13 mg/dL (ref 6–20)
CALCIUM: 8.8 mg/dL — AB (ref 8.9–10.3)
CHLORIDE: 106 mmol/L (ref 101–111)
CO2: 26 mmol/L (ref 22–32)
CREATININE: 0.5 mg/dL — AB (ref 0.61–1.24)
GFR calc Af Amer: >60 mL/min (ref >60)
Glucose, Bld: 114 mg/dL — ABNORMAL HIGH (ref 65–99)
POTASSIUM: 4.9 mmol/L (ref 3.5–5.1)
Sodium: 138 mmol/L (ref 135–145)
Total Protein: 6 g/dL — ABNORMAL LOW (ref 6.5–8.1)

## 2017-06-24 LAB — URINE CULTURE: CULTURE: NO GROWTH

## 2017-06-24 LAB — CBC WITH DIFFERENTIAL/PLATELET
BASOS ABS: 0 10*3/uL (ref 0.0–0.1)
BASOS PCT: 0 %
EOS PCT: 2 %
Eosinophils Absolute: 0.3 10*3/uL (ref 0.0–0.7)
HEMATOCRIT: 28 % — AB (ref 39.0–52.0)
Hemoglobin: 9.2 g/dL — ABNORMAL LOW (ref 13.0–17.0)
Lymphocytes Relative: 15 %
Lymphs Abs: 1.7 10*3/uL (ref 0.7–4.0)
MCH: 30.1 pg (ref 26.0–34.0)
MCHC: 32.9 g/dL (ref 30.0–36.0)
MCV: 91.5 fL (ref 78.0–100.0)
MONO ABS: 1.2 10*3/uL — AB (ref 0.1–1.0)
MONOS PCT: 10 %
NEUTROS ABS: 8.5 10*3/uL — AB (ref 1.7–7.7)
Neutrophils Relative %: 73 %
PLATELETS: 668 10*3/uL — AB (ref 150–400)
RBC: 3.06 MIL/uL — ABNORMAL LOW (ref 4.22–5.81)
RDW: 15.2 % (ref 11.5–15.5)
WBC: 11.7 10*3/uL — ABNORMAL HIGH (ref 4.0–10.5)

## 2017-06-24 LAB — PHOSPHORUS: PHOSPHORUS: 2.6 mg/dL (ref 2.5–4.6)

## 2017-06-24 LAB — MAGNESIUM: Magnesium: 1.9 mg/dL (ref 1.7–2.4)

## 2017-06-24 LAB — HIV ANTIBODY (ROUTINE TESTING W REFLEX): HIV SCREEN 4TH GENERATION: NONREACTIVE

## 2017-06-24 MED ORDER — CIPROFLOXACIN HCL 500 MG PO TABS: 500.0000 mg | ORAL_TABLET | Freq: Two times a day (BID) | ORAL | 0 refills | Status: AC

## 2017-06-24 MED ORDER — ENSURE ENLIVE PO LIQD: 237.0000 mL | Freq: Two times a day (BID) | ORAL | 0 refills | Status: DC

## 2017-06-24 NOTE — Progress Notes (Addendum)
Subjective/Chief Complaint: Patient feels much better, still a little sore Tolerating diet Ready to go home Some colostomy output   Objective: Vital signs in last 24 hours: Temp:  [97.8 F (36.6 C)-99 F (37.2 C)] 97.8 F (36.6 C) (01/09 0411) Pulse Rate:  [86-110] 86 (01/09 0411) Resp:  [18-23] 20 (01/09 0411) BP: (94-123)/(58-77) 123/77 (01/09 0411) SpO2:  [94 %-100 %] 100 % (01/09 0411) Last BM Date: 06/23/17  Intake/Output from previous day: 01/08 0701 - 01/09 0700 In: 2420 [P.O.:965; I.V.:1055; IV Piggyback:400] Out: 1000 [Urine:1000] Intake/Output this shift: No intake/output data recorded.  General appearance: alert, cooperative and no distress GI: stoma functioning, wound is clean; abd soft and non-tender  Lab Results:  Recent Labs    06/23/17 1236 06/24/17 0356  WBC 13.3* 11.7*  HGB 9.6* 9.2*  HCT 29.3* 28.0*  PLT 639* 668*   BMET Recent Labs    06/23/17 1236 06/24/17 0356  NA 135 138  K 2.3* 4.9  CL 101 106  CO2 25 26  GLUCOSE 226* 114*  BUN 8 13  CREATININE 0.58* 0.50*  CALCIUM 8.6* 8.8*   PT/INR No results for input(s): LABPROT, INR in the last 72 hours. ABG No results for input(s): PHART, HCO3 in the last 72 hours.  Invalid input(s): PCO2, PO2  Studies/Results: Ct Abdomen Pelvis W Contrast  Result Date: 06/22/2017 CLINICAL DATA:  Abdominal pain, fever EXAM: CT ABDOMEN AND PELVIS WITH CONTRAST TECHNIQUE: Multidetector CT imaging of the abdomen and pelvis was performed using the standard protocol following bolus administration of intravenous contrast. CONTRAST:  195mL ISOVUE-300 IOPAMIDOL (ISOVUE-300) INJECTION 61% COMPARISON:  None. FINDINGS: Lower chest: Lung bases are clear. No effusions. Heart is normal size. Hepatobiliary: No focal hepatic abnormality. Gallbladder unremarkable. Pancreas: No focal abnormality or ductal dilatation. Spleen: No focal abnormality. Normal size. There is a small fluid collection adjacent to the lateral  aspect of the spleen which may represent subcapsular fluid collection. Adrenals/Urinary Tract: Bladder wall thickening noted which may be related to cystitis. There is R locules of gas within the bladder, presumably from recent catheterization. No hydronephrosis or focal renal abnormality. Adrenal glands unremarkable. Stomach/Bowel: There appears to be mild wall thickening within the rectosigmoid colon which is somewhat under distended and difficult to evaluate. No evidence of bowel obstruction. Appendix not definitively seen, but no pericecal inflammation. Left lower quadrant colostomy noted. Vascular/Lymphatic: Scattered aortic and iliac calcifications. No evidence of aneurysm or adenopathy. Reproductive: Mildly prominent prostate with central calcifications. Other: No free fluid or free air. Musculoskeletal: No acute bony abnormality or focal bone lesion. IMPRESSION: Diverting colostomy noted in the left lower quadrant. The rectosigmoid colon appears somewhat thick walled but is decompressed and difficult to evaluate, related to the colostomy. Bladder wall thickening may reflect cystitis or a component of bladder outlet obstruction. Recommend clinical correlation. Small fluid collection lateral to the spleen may reflect subcapsular fluid. No underlying splenic abnormality. This is of unknown etiology or significance. Electronically Signed   By: Rolm Baptise M.D.   On: 06/22/2017 15:44   Dg Chest Port 1 View  Result Date: 06/22/2017 CLINICAL DATA:  57 year old male with increased pain, decreased stool output in ostomy bag status post surgery on 05/13/2017 for treatment of diverticulitis with colocutaneous fistula. EXAM: PORTABLE CHEST 1 VIEW COMPARISON:  05/11/2017 and earlier. FINDINGS: Portable AP upright view at 1431 hours. Resolved patchy right lower lung opacity seen in November. Allowing for portable technique the lungs are clear ; the left lung base is not entirely  included. Normal cardiac size and  mediastinal contours. Visualized tracheal air column is within normal limits. No acute osseous abnormality identified. IMPRESSION: No acute cardiopulmonary abnormality. Electronically Signed   By: Genevie Ann M.D.   On: 06/22/2017 14:51    Anti-infectives: Anti-infectives (From admission, onward)   Start     Dose/Rate Route Frequency Ordered Stop   06/23/17 0530  vancomycin (VANCOCIN) 500 mg in sodium chloride 0.9 % 100 mL IVPB     500 mg 100 mL/hr over 60 Minutes Intravenous Every 12 hours 06/22/17 1927     06/23/17 0100  piperacillin-tazobactam (ZOSYN) IVPB 3.375 g     3.375 g 12.5 mL/hr over 240 Minutes Intravenous Every 8 hours 06/22/17 1927     06/22/17 1515  piperacillin-tazobactam (ZOSYN) IVPB 3.375 g     3.375 g 100 mL/hr over 30 Minutes Intravenous  Once 06/22/17 1514 06/22/17 1734   06/22/17 1515  vancomycin (VANCOCIN) IVPB 1000 mg/200 mL premix     1,000 mg 200 mL/hr over 60 Minutes Intravenous  Once 06/22/17 1514 06/22/17 1738      Assessment/Plan: S/p hartmanns for diverticular disease 05/13/17 by Dr. Donne Hazel  - I think he likely just has cystitis, possible residual disease in remaining rectosigmoid colon -he can be discharged when medicine ready -he only needs motrin/tylenol at home at this point, he does not need narcotic rx, I discussed this plan with him -not on pharm dvt proph, if he is staying in hospital can start lovenox from my standpoint and would recommend this -Dr. Donne Hazel will see him in 2-3 weeks in office  Will sign off for now as it appears the patient is to be discharged today.     LOS: 2 days    Maia Petties 06/24/2017

## 2017-06-24 NOTE — Progress Notes (Signed)
Patient was discharged home with no acute distress noted. Medications and discharge instructions explained to the patient. He verbalized understanding. Copies given to him. IV saline lock and tele pack removed. CCMD notified.

## 2017-06-24 NOTE — Discharge Instructions (Signed)
Urinary Tract Infection, Adult  A urinary tract infection (UTI) is an infection of any part of the urinary tract, which includes the kidneys, ureters, bladder, and urethra. These organs make, store, and get rid of urine in the body. UTI can be a bladder infection (cystitis) or kidney infection (pyelonephritis).  What are the causes?  This infection may be caused by fungi, viruses, or bacteria. Bacteria are the most common cause of UTIs. This condition can also be caused by repeated incomplete emptying of the bladder during urination.  What increases the risk?  This condition is more likely to develop if:   You ignore your need to urinate or hold urine for long periods of time.   You do not empty your bladder completely during urination.   You wipe back to front after urinating or having a bowel movement, if you are male.   You are uncircumcised, if you are male.   You are constipated.   You have a urinary catheter that stays in place (indwelling).   You have a weak defense (immune) system.   You have a medical condition that affects your bowels, kidneys, or bladder.   You have diabetes.   You take antibiotic medicines frequently or for long periods of time, and the antibiotics no longer work well against certain types of infections (antibiotic resistance).   You take medicines that irritate your urinary tract.   You are exposed to chemicals that irritate your urinary tract.   You are male.    What are the signs or symptoms?  Symptoms of this condition include:   Fever.   Frequent urination or passing small amounts of urine frequently.   Needing to urinate urgently.   Pain or burning with urination.   Urine that smells bad or unusual.   Cloudy urine.   Pain in the lower abdomen or back.   Trouble urinating.   Blood in the urine.   Vomiting or being less hungry than normal.   Diarrhea or abdominal pain.   Vaginal discharge, if you are male.    How is this diagnosed?  This condition is  diagnosed with a medical history and physical exam. You will also need to provide a urine sample to test your urine. Other tests may be done, including:   Blood tests.   Sexually transmitted disease (STD) testing.    If you have had more than one UTI, a cystoscopy or imaging studies may be done to determine the cause of the infections.  How is this treated?  Treatment for this condition often includes a combination of two or more of the following:   Antibiotic medicine.   Other medicines to treat less common causes of UTI.   Over-the-counter medicines to treat pain.   Drinking enough water to stay hydrated.    Follow these instructions at home:   Take over-the-counter and prescription medicines only as told by your health care provider.   If you were prescribed an antibiotic, take it as told by your health care provider. Do not stop taking the antibiotic even if you start to feel better.   Avoid alcohol, caffeine, tea, and carbonated beverages. They can irritate your bladder.   Drink enough fluid to keep your urine clear or pale yellow.   Keep all follow-up visits as told by your health care provider. This is important.   Make sure to:  ? Empty your bladder often and completely. Do not hold urine for long periods of time.  ?   Empty your bladder before and after sex.  ? Wipe from front to back after a bowel movement if you are male. Use each tissue one time when you wipe.  Contact a health care provider if:   You have back pain.   You have a fever.   You feel nauseous or vomit.   Your symptoms do not get better after 3 days.   Your symptoms go away and then return.  Get help right away if:   You have severe back pain or lower abdominal pain.   You are vomiting and cannot keep down any medicines or water.  This information is not intended to replace advice given to you by your health care provider. Make sure you discuss any questions you have with your health care provider.  Document Released:  03/12/2005 Document Revised: 11/14/2015 Document Reviewed: 04/23/2015  Elsevier Interactive Patient Education  2018 [employer]

## 2017-06-24 NOTE — Discharge Summary (Addendum)
Physician Discharge Summary  Dominic Phillips JME:[id number] DOB: 03-26-1961 DOA: 06/22/2017  PCP: Patient, No Pcp Per  Admit date: 06/22/2017 Discharge date: 06/24/2017  Admitted From: Home Disposition: Home  Recommendations for Outpatient Follow-up:  1. Follow up with PCP in 1 week 2. Please obtain BMP/CBC in one week 3. Please follow up on the following pending results: Blood culture final result  Home Health: None Equipment/Devices: None  Discharge Condition: Stable CODE STATUS: Full code Diet recommendation: Regular   Brief/Interim Summary:  Admission HPI written by Velvet Bathe, MD   Chief Complaint: fevers and abdominal pain  HPI: Dominic Phillips is a 57 y.o. 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 Donne Hazel. He is presenting with complaints of abdominal discomfort which has been ongoing for more than 1 week. This is been persistent nothing he is aware of makes it better. Abdominal pain is located in his lower abdomen and radiates from right to left. Problem has been persistent and gradually getting worse. Patient also endorses fevers. He also reports not noticing much output lately into his colostomy bag. Otherwise reports no other complaints   ED Course: Patient was found to have PE hypokalemic, soft blood pressures, with leukocytosis and tachycardia and tachypnea. They have consulted general surgery and we were subsequently consulted for further evaluation recommendations.    Hospital course:  Sepsis Complicated UTI Secondary to UTI. Blood cultures negative. Started on broad spectrum antibiotics of Vancomycin and Zosyn. Leukocytosis improved. Urinalysis suggestive of an infection. Urine culture with no growth (obtained after antibiotics). Blood culture without growth to date. CT scan significant for a small fluid collection lateral to the spleen (no pain/tenderness in this area). General surgery consulted and felt  source was more likely urinary. Patient improved overall with antibiotics. Since no definite bacteria isolated, Ciprofloxacin prescribed on discharge to complete a 7 day course of antibiotic treatment.  Hypokalemia Secondary to GI losses. Supplementation given.  Nicotine dependence Patch given and cessation discussed.  Discharge Diagnoses:  Active Problems:   Leukocytosis   Hypokalemia    Discharge Instructions  Discharge Instructions    Diet - low sodium heart healthy   Complete by:  As directed    Increase activity slowly   Complete by:  As directed      Allergies as of 06/24/2017   No Known Allergies     Medication List    STOP taking these medications   hyoscyamine 0.125 MG SL tablet Commonly known as:  LEVSIN SL   oxyCODONE 5 MG immediate release tablet Commonly known as:  Oxy IR/ROXICODONE     TAKE these medications   acetaminophen 325 MG tablet Commonly known as:  TYLENOL Take 2 tablets (650 mg total) by mouth every 6 (six) hours. What changed:  Another medication with the same name was removed. Continue taking this medication, and follow the directions you see here.   ciprofloxacin 500 MG tablet Commonly known as:  CIPRO Take 1 tablet (500 mg total) by mouth 2 (two) times daily for 5 days.   feeding supplement (ENSURE ENLIVE) Liqd Take 237 mLs by mouth 2 (two) times daily between meals.   ibuprofen 200 MG tablet Commonly known as:  ADVIL,MOTRIN Take 800 mg by mouth every 6 (six) hours as needed.      Follow-up Information    Rolm Bookbinder, MD Follow up in 2 week(s).   Specialty:  General Surgery Contact information: [address] [place] [address] [phone]  No Known Allergies  Consultations:  General surgery   Procedures/Studies: Ct Abdomen Pelvis W Contrast  Result Date: 06/22/2017 CLINICAL DATA:  Abdominal pain, fever EXAM: CT ABDOMEN AND PELVIS WITH CONTRAST TECHNIQUE: Multidetector CT imaging of  the abdomen and pelvis was performed using the standard protocol following bolus administration of intravenous contrast. CONTRAST:  168mL ISOVUE-300 IOPAMIDOL (ISOVUE-300) INJECTION 61% COMPARISON:  None. FINDINGS: Lower chest: Lung bases are clear. No effusions. Heart is normal size. Hepatobiliary: No focal hepatic abnormality. Gallbladder unremarkable. Pancreas: No focal abnormality or ductal dilatation. Spleen: No focal abnormality. Normal size. There is a small fluid collection adjacent to the lateral aspect of the spleen which may represent subcapsular fluid collection. Adrenals/Urinary Tract: Bladder wall thickening noted which may be related to cystitis. There is R locules of gas within the bladder, presumably from recent catheterization. No hydronephrosis or focal renal abnormality. Adrenal glands unremarkable. Stomach/Bowel: There appears to be mild wall thickening within the rectosigmoid colon which is somewhat under distended and difficult to evaluate. No evidence of bowel obstruction. Appendix not definitively seen, but no pericecal inflammation. Left lower quadrant colostomy noted. Vascular/Lymphatic: Scattered aortic and iliac calcifications. No evidence of aneurysm or adenopathy. Reproductive: Mildly prominent prostate with central calcifications. Other: No free fluid or free air. Musculoskeletal: No acute bony abnormality or focal bone lesion. IMPRESSION: Diverting colostomy noted in the left lower quadrant. The rectosigmoid colon appears somewhat thick walled but is decompressed and difficult to evaluate, related to the colostomy. Bladder wall thickening may reflect cystitis or a component of bladder outlet obstruction. Recommend clinical correlation. Small fluid collection lateral to the spleen may reflect subcapsular fluid. No underlying splenic abnormality. This is of unknown etiology or significance. Electronically Signed   By: Rolm Baptise M.D.   On: 06/22/2017 15:44   Dg Chest Port 1  View  Result Date: 06/22/2017 CLINICAL DATA:  57 year old male with increased pain, decreased stool output in ostomy bag status post surgery on 05/13/2017 for treatment of diverticulitis with colocutaneous fistula. EXAM: PORTABLE CHEST 1 VIEW COMPARISON:  05/11/2017 and earlier. FINDINGS: Portable AP upright view at 1431 hours. Resolved patchy right lower lung opacity seen in November. Allowing for portable technique the lungs are clear ; the left lung base is not entirely included. Normal cardiac size and mediastinal contours. Visualized tracheal air column is within normal limits. No acute osseous abnormality identified. IMPRESSION: No acute cardiopulmonary abnormality. Electronically Signed   By: Genevie Ann M.D.   On: 06/22/2017 14:51      Subjective: Abdominal pain improved.   Discharge Exam: Vitals:   06/24/17 0904 06/24/17 1227  BP: 119/73 108/70  Pulse: 84 92  Resp: 19 (!) 22  Temp: 97.7 F (36.5 C) (!) 97.5 F (36.4 C)  SpO2: 99% 100%   Vitals:   06/23/17 2308 06/24/17 0411 06/24/17 0904 06/24/17 1227  BP: 103/68 123/77 119/73 108/70  Pulse: 88 86 84 92  Resp: (!) 21 20 19  (!) 22  Temp: 98.1 F (36.7 C) 97.8 F (36.6 C) 97.7 F (36.5 C) (!) 97.5 F (36.4 C)  TempSrc: Oral Oral Oral Oral  SpO2: 99% 100% 99% 100%  Weight:      Height:        General: Pt is alert, awake, not in acute distress Cardiovascular: RRR, S1/S2 +, no rubs, no gallops Respiratory: CTA bilaterally, no wheezing, no rhonchi Abdominal: Soft, minimally tender in lower abdomen, ND, bowel sounds +, ostomy bag with brown stool and no erythema surrounding the ostomy site  Extremities: no edema, no cyanosis    The results of significant diagnostics from this hospitalization (including imaging, microbiology, ancillary and laboratory) are listed below for reference.     Microbiology: Recent Results (from the past 240 hour(s))  Culture, Urine     Status: None   Collection Time: 06/22/17  6:45 PM  Result  Value Ref Range Status   Specimen Description URINE, CLEAN CATCH  Final   Special Requests NONE  Final   Culture NO GROWTH  Final   Report Status 06/24/2017 FINAL  Final  Culture, blood (Routine X 2) w Reflex to ID Panel     Status: None (Preliminary result)   Collection Time: 06/22/17  6:46 PM  Result Value Ref Range Status   Specimen Description BLOOD RIGHT ARM  Final   Special Requests   Final    Blood Culture adequate volume BOTTLES DRAWN AEROBIC AND ANAEROBIC   Culture NO GROWTH 2 DAYS  Final   Report Status PENDING  Incomplete  Culture, blood (Routine X 2) w Reflex to ID Panel     Status: None (Preliminary result)   Collection Time: 06/22/17  7:51 PM  Result Value Ref Range Status   Specimen Description BLOOD LEFT FOREARM  Final   Special Requests   Final    Blood Culture adequate volume BOTTLES DRAWN AEROBIC AND ANAEROBIC   Culture NO GROWTH 2 DAYS  Final   Report Status PENDING  Incomplete  MRSA PCR Screening     Status: None   Collection Time: 06/22/17 10:30 PM  Result Value Ref Range Status   MRSA by PCR NEGATIVE NEGATIVE Final    Comment:        The GeneXpert MRSA Assay (FDA approved for NASAL specimens only), is one component of a comprehensive MRSA colonization surveillance program. It is not intended to diagnose MRSA infection nor to guide or monitor treatment for MRSA infections.      Labs: BNP (last 3 results) No results for input(s): BNP in the last 8760 hours. Basic Metabolic Panel: Recent Labs  Lab 06/22/17 1009 06/22/17 1512 06/23/17 1236 06/24/17 0356  NA 133* 135 135 138  K 3.0* 2.6* 2.3* 4.9  CL 97* 98* 101 106  CO2 24  --  25 26  GLUCOSE 96 90 226* 114*  BUN <5* <3* 8 13  CREATININE 0.54* 0.40* 0.58* 0.50*  CALCIUM 9.4  --  8.6* 8.8*  MG  --   --  1.8 1.9  PHOS  --   --  3.3 2.6   Liver Function Tests: Recent Labs  Lab 06/22/17 1009 06/23/17 1236 06/24/17 0356  AST 14* 20 15  ALT 9* 11* 11*  ALKPHOS 125 107 104  BILITOT 0.5  0.4 0.5  PROT 7.3 6.3* 6.0*  ALBUMIN 2.8* 2.1* 2.1*   Recent Labs  Lab 06/22/17 1009  LIPASE 22   No results for input(s): AMMONIA in the last 168 hours. CBC: Recent Labs  Lab 06/22/17 1009 06/22/17 1512 06/23/17 1236 06/24/17 0356  WBC 18.7*  --  13.3* 11.7*  NEUTROABS  --   --  11.0* 8.5*  HGB 10.6* 10.5* 9.6* 9.2*  HCT 32.3* 31.0* 29.3* 28.0*  MCV 90.7  --  90.4 91.5  PLT 705*  --  639* 668*   Cardiac Enzymes: No results for input(s): CKTOTAL, CKMB, CKMBINDEX, TROPONINI in the last 168 hours. BNP: Invalid input(s): POCBNP CBG: No results for input(s): GLUCAP in the last 168 hours. D-Dimer No results for input(s): DDIMER  in the last 72 hours. Hgb A1c No results for input(s): HGBA1C in the last 72 hours. Lipid Profile No results for input(s): CHOL, HDL, LDLCALC, TRIG, CHOLHDL, LDLDIRECT in the last 72 hours. Thyroid function studies No results for input(s): TSH, T4TOTAL, T3FREE, THYROIDAB in the last 72 hours.  Invalid input(s): FREET3 Anemia work up No results for input(s): VITAMINB12, FOLATE, FERRITIN, TIBC, IRON, RETICCTPCT in the last 72 hours. Urinalysis    Component Value Date/Time   COLORURINE YELLOW 06/22/2017 1005   APPEARANCEUR CLOUDY (A) 06/22/2017 1005   LABSPEC 1.006 06/22/2017 1005   PHURINE 6.0 06/22/2017 1005   GLUCOSEU NEGATIVE 06/22/2017 1005   HGBUR MODERATE (A) 06/22/2017 1005   BILIRUBINUR NEGATIVE 06/22/2017 1005   KETONESUR NEGATIVE 06/22/2017 1005   PROTEINUR 30 (A) 06/22/2017 1005   NITRITE NEGATIVE 06/22/2017 1005   LEUKOCYTESUR MODERATE (A) 06/22/2017 1005   Sepsis Labs Invalid input(s): PROCALCITONIN,  WBC,  LACTICIDVEN Microbiology Recent Results (from the past 240 hour(s))  Culture, Urine     Status: None   Collection Time: 06/22/17  6:45 PM  Result Value Ref Range Status   Specimen Description URINE, CLEAN CATCH  Final   Special Requests NONE  Final   Culture NO GROWTH  Final   Report Status 06/24/2017 FINAL  Final   Culture, blood (Routine X 2) w Reflex to ID Panel     Status: None (Preliminary result)   Collection Time: 06/22/17  6:46 PM  Result Value Ref Range Status   Specimen Description BLOOD RIGHT ARM  Final   Special Requests   Final    Blood Culture adequate volume BOTTLES DRAWN AEROBIC AND ANAEROBIC   Culture NO GROWTH 2 DAYS  Final   Report Status PENDING  Incomplete  Culture, blood (Routine X 2) w Reflex to ID Panel     Status: None (Preliminary result)   Collection Time: 06/22/17  7:51 PM  Result Value Ref Range Status   Specimen Description BLOOD LEFT FOREARM  Final   Special Requests   Final    Blood Culture adequate volume BOTTLES DRAWN AEROBIC AND ANAEROBIC   Culture NO GROWTH 2 DAYS  Final   Report Status PENDING  Incomplete  MRSA PCR Screening     Status: None   Collection Time: 06/22/17 10:30 PM  Result Value Ref Range Status   MRSA by PCR NEGATIVE NEGATIVE Final    Comment:        The GeneXpert MRSA Assay (FDA approved for NASAL specimens only), is one component of a comprehensive MRSA colonization surveillance program. It is not intended to diagnose MRSA infection nor to guide or monitor treatment for MRSA infections.       SIGNED:   Cordelia Poche, MD [facility] 06/24/2017, 1:39 PM Pager [phone]  If 7PM-7AM, please contact night-coverage [email or web address] Password [email or web address]

## 2017-06-27 LAB — CULTURE, BLOOD (ROUTINE X 2)
Culture: NO GROWTH
Culture: NO GROWTH
SPECIAL REQUESTS: ADEQUATE
SPECIAL REQUESTS: ADEQUATE

## 2017-11-13 ENCOUNTER — Ambulatory Visit: Payer: Self-pay | Admitting: Surgery

## 2017-11-13 NOTE — H&P (Signed)
CC: Colostomy status-referred by Dr. Donne Hazel for evaluation of Dominic Phillips  HPI: Dominic Phillips is a very pleasant 57 year old gentleman with history of tobacco abuse and diverticulitis presents for evaluation of Dominic Phillips. He underwent X Porter laparotomy and Dominic procedure for diverticulitis with colocutaneous fistula on 05/13/17 by Dr. Donne Hazel. Pathology returned benign. His hospital course was complicated by acute hypoxic respiratory failure/ARDS and was intubated for a week for this. He also had issues with bilateral pleural effusions. He developed a AKI and required CRRT  PMH: Tobacco abuse-current and uncontrolled; alcohol abuse-reformed alcoholic; diverticulitis; COPD; malnutrition  PSH: Exploratory laparotomy with Dominic for sigmoid stricture and colocutaneous fistula-Dr. Donne Hazel, 05/13/17  FHx: Denies FHx of malignancy  Social: 2 pack per day smoking history for 30 years; former alcoholic-states his last drink was October 2018. Denies illicit drug use  ROS: A comprehensive 10 system review of systems was completed with the patient and pertinent findings as noted above.  The patient is a 57 year old male.   Allergies Dominic Phillips; 11/13/2017 3:08 PM) No Known Drug Allergies [06/03/2017]: Allergies Reconciled   Medication History Dominic Phillips; 11/13/2017 3:08 PM) Oscimin (0.125MG  Tab Sublingual, Sublingual) Active. Acetaminophen (650MG  Tablet, Oral) Active. Medications Reconciled    Review of Systems Dominic Gave M. Keala Drum MD; 11/13/2017 3:59 PM) General Not Present- Appetite Loss, Chills, Fatigue, Fever and Night Sweats. Note: All other systems negative (unless as noted in HPI & included Review of Systems) Skin Not Present- Change in Wart/Mole, Dryness, Hives, Jaundice, New Lesions, Non-Healing Wounds, Rash and Ulcer. HEENT Not Present- Earache, Hearing Loss, Hoarseness, Nose Bleed, Oral Ulcers, Ringing in the Ears, Seasonal Allergies,  Sinus Pain, Sore Throat, Visual Disturbances, Wears glasses/contact lenses and Yellow Eyes. Respiratory Not Present- Bloody sputum, Chronic Cough, Difficulty Breathing, Snoring and Wheezing. Breast Not Present- Breast Mass, Breast Pain, Nipple Discharge and Skin Changes. Cardiovascular Not Present- Chest Pain, Difficulty Breathing Lying Down, Leg Cramps, Palpitations, Rapid Heart Rate, Shortness of Breath and Swelling of Extremities. Gastrointestinal Not Present- Abdominal Pain, Bloody Stool, Nausea, Rectal Pain and Vomiting. Male Genitourinary Not Present- Blood in Urine, Change in Urinary Stream, Frequency, Impotence, Nocturia, Painful Urination, Urgency and Urine Leakage. Musculoskeletal Not Present- Back Pain, Joint Pain, Joint Stiffness, Muscle Pain, Muscle Weakness and Swelling of Extremities. Neurological Not Present- Decreased Memory, Fainting, Headaches, Numbness, Seizures, Tingling, Tremor, Trouble walking and Weakness. Psychiatric Not Present- Anxiety, Bipolar, Change in Sleep Pattern, Depression, Fearful and Frequent crying. Endocrine Not Present- Cold Intolerance, Excessive Hunger, Hair Changes, Heat Intolerance and New Diabetes. Hematology Not Present- Abnormal Bleeding, Blood Thinners and Easy Bruising.  Vitals Dominic Phillips; 11/13/2017 3:09 PM) 11/13/2017 3:09 PM Weight: 124.25 lb Height: 69in Body Surface Area: 1.69 m Body Mass Index: 18.35 kg/m  Temp.: 98.60F(Oral)  Pulse: 97 (Regular)  BP: 110/72 (Sitting, Left Arm, Standard)       Physical Exam Dominic Gave M. Kaarin Pardy MD; 11/13/2017 4:00 PM) The physical exam findings are as follows: Note:Constitutional: No acute distress; conversant; no deformities; missing multiple teeth Eyes: Moist conjunctiva; no lid lag; anicteric sclerae; pupils equal round and reactive to light Neck: Trachea midline; no palpable thyromegaly Lungs: Normal respiratory effort; no tactile fremitus CV: Regular rate and rhythm; no  palpable thrill; no pitting edema GI: Abdomen soft, nontender, nondistended; no palpable hepatosplenomegaly; colostomy in LLQ pink, patent, productive; wound pink but healed MSK: Normal gait; no clubbing/cyanosis Psychiatric: Appropriate affect; alert and oriented 3 Lymphatic: No palpable cervical or axillary lymphadenopathy    Assessment & Plan Dominic Gave M. 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 He had a prolonged hospitalization and multisystem organ failure including temporary need for dialysis as well as intubation -He has never had a colonoscopy so we will plan to schedule him for a colonoscopy -Will also schedule for Gastrografin enema via anus -I discussed that he needs to quit smoking for multiple reasons and certainly at least 6-8 weeks prior to explaining any colostomy Phillips surgeries -In the interim, he is seeking help with smoking cessation and will keep [place] apprised of his progress. -The anatomy and physiology of the GI tract was discussed at length with the patient. The pathophysiology of colon polyps and colostomies was discussed at length with associated pictures. -We discussed the planned procedure-colonoscopy, material risks (including, but not limited to, bleeding, perforation of colon, need for additional procedures, aspiration, pneumonia, heart attack, stroke, death) benefits and alternatives. His questions are answered to satisfaction, he voiced understanding and elected to proceed  Signed electronically by Dominic Roup, MD (11/13/2017 4:04 PM)

## 2017-11-20 ENCOUNTER — Ambulatory Visit: Payer: Self-pay | Admitting: Surgery

## 2017-11-20 ENCOUNTER — Other Ambulatory Visit: Payer: Self-pay | Admitting: Surgery

## 2017-11-20 DIAGNOSIS — Z933 Colostomy status: Secondary | ICD-10-CM

## 2017-11-26 ENCOUNTER — Ambulatory Visit
Admission: RE | Admit: 2017-11-26 | Discharge: 2017-11-26 | Disposition: A | Discharge status: Home / Self Care | Payer: Medicaid Other | Source: Ambulatory Visit | Attending: Surgery | Admitting: Surgery

## 2017-11-26 DIAGNOSIS — Z933 Colostomy status: Secondary | ICD-10-CM

## 2018-01-06 ENCOUNTER — Encounter (HOSPITAL_COMMUNITY): Payer: Self-pay | Admitting: *Deleted

## 2018-01-07 ENCOUNTER — Ambulatory Visit (HOSPITAL_COMMUNITY): Payer: Medicaid Other | Admitting: Anesthesiology

## 2018-01-07 ENCOUNTER — Other Ambulatory Visit: Payer: Self-pay

## 2018-01-07 ENCOUNTER — Encounter (HOSPITAL_COMMUNITY): Payer: Self-pay

## 2018-01-07 ENCOUNTER — Ambulatory Visit (HOSPITAL_COMMUNITY)
Admission: RE | Admit: 2018-01-07 | Discharge: 2018-01-07 | Disposition: A | Discharge status: Home / Self Care | Payer: Medicaid Other | Source: Ambulatory Visit | Attending: Surgery | Admitting: Surgery

## 2018-01-07 ENCOUNTER — Encounter (HOSPITAL_COMMUNITY)
Admission: RE | Disposition: A | Discharge status: Home / Self Care | Payer: Self-pay | Source: Ambulatory Visit | Attending: Surgery

## 2018-01-07 DIAGNOSIS — Z9049 Acquired absence of other specified parts of digestive tract: Secondary | ICD-10-CM | POA: Insufficient documentation

## 2018-01-07 DIAGNOSIS — J449 Chronic obstructive pulmonary disease, unspecified: Secondary | ICD-10-CM | POA: Diagnosis not present

## 2018-01-07 DIAGNOSIS — Z933 Colostomy status: Secondary | ICD-10-CM | POA: Diagnosis not present

## 2018-01-07 DIAGNOSIS — K635 Polyp of colon: Secondary | ICD-10-CM | POA: Insufficient documentation

## 2018-01-07 DIAGNOSIS — R933 Abnormal findings on diagnostic imaging of other parts of digestive tract: Secondary | ICD-10-CM | POA: Insufficient documentation

## 2018-01-07 DIAGNOSIS — D3A8 Other benign neuroendocrine tumors: Secondary | ICD-10-CM | POA: Diagnosis not present

## 2018-01-07 DIAGNOSIS — F17210 Nicotine dependence, cigarettes, uncomplicated: Secondary | ICD-10-CM | POA: Diagnosis not present

## 2018-01-07 DIAGNOSIS — D122 Benign neoplasm of ascending colon: Secondary | ICD-10-CM | POA: Insufficient documentation

## 2018-01-07 DIAGNOSIS — K604 Rectal fistula: Secondary | ICD-10-CM | POA: Diagnosis not present

## 2018-01-07 HISTORY — PX: POLYPECTOMY: SHX5525

## 2018-01-07 HISTORY — PX: COLONOSCOPY WITH PROPOFOL: SHX5780

## 2018-01-07 SURGERY — COLONOSCOPY WITH PROPOFOL
Anesthesia: Monitor Anesthesia Care

## 2018-01-07 MED ORDER — PROPOFOL 500 MG/50ML IV EMUL
INTRAVENOUS | Status: DC | PRN
  Administered 2018-01-07 (×2): 10 mg via INTRAVENOUS
  Administered 2018-01-07: 40 mg via INTRAVENOUS
  Administered 2018-01-07 (×5): 10 mg via INTRAVENOUS

## 2018-01-07 MED ORDER — PROPOFOL 10 MG/ML IV BOLUS
INTRAVENOUS | Status: AC
  Filled 2018-01-07: qty 40

## 2018-01-07 MED ORDER — PROPOFOL 500 MG/50ML IV EMUL
INTRAVENOUS | Status: DC | PRN
  Administered 2018-01-07: 150 ug/kg/min via INTRAVENOUS

## 2018-01-07 MED ORDER — PROPOFOL 10 MG/ML IV BOLUS
INTRAVENOUS | Status: AC
  Filled 2018-01-07: qty 20

## 2018-01-07 MED ORDER — ONDANSETRON HCL 4 MG/2ML IJ SOLN
INTRAMUSCULAR | Status: DC | PRN
  Administered 2018-01-07: 4 mg via INTRAVENOUS

## 2018-01-07 MED ORDER — SODIUM CHLORIDE 0.9 % IV SOLN: INTRAVENOUS | Status: DC

## 2018-01-07 MED ORDER — SPOT INK MARKER SYRINGE KIT
PACK | SUBMUCOSAL | Status: DC | PRN
  Administered 2018-01-07: 2 mL via SUBMUCOSAL

## 2018-01-07 MED ORDER — SPOT INK MARKER SYRINGE KIT
PACK | SUBMUCOSAL | Status: AC
  Filled 2018-01-07: qty 5

## 2018-01-07 MED ORDER — LACTATED RINGERS IV SOLN
INTRAVENOUS | Status: DC
  Administered 2018-01-07: 1000 mL via INTRAVENOUS

## 2018-01-07 NOTE — Anesthesia Procedure Notes (Signed)
Date/Time: 01/07/2018 9:03 AM Performed by: Glory Buff, CRNA Oxygen Delivery Method: Simple face mask

## 2018-01-07 NOTE — Discharge Instructions (Signed)

## 2018-01-07 NOTE — Anesthesia Preprocedure Evaluation (Addendum)
Anesthesia Evaluation  Patient identified by MRN, date of birth, ID band Patient awake    Reviewed: Allergy & Precautions, H&P , NPO status , Patient's Chart, lab work & pertinent test results  Airway Mallampati: II   Neck ROM: full    Dental   Pulmonary shortness of breath, COPD, Current Smoker,    breath sounds clear to auscultation       Cardiovascular negative cardio ROS   Rhythm:regular Rate:Normal     Neuro/Psych    GI/Hepatic (+)     substance abuse  alcohol use,   Endo/Other    Renal/GU      Musculoskeletal   Abdominal   Peds  Hematology   Anesthesia Other Findings   Reproductive/Obstetrics                             Anesthesia Physical Anesthesia Plan  ASA: III  Anesthesia Plan: MAC   Post-op Pain Management:    Induction: Intravenous  PONV Risk Score and Plan: 0 and Propofol infusion and Treatment may vary due to age or medical condition  Airway Management Planned: Simple Face Mask  Additional Equipment:   Intra-op Plan:   Post-operative Plan:   Informed Consent: I have reviewed the patients History and Physical, chart, labs and discussed the procedure including the risks, benefits and alternatives for the proposed anesthesia with the patient or authorized representative who has indicated his/her understanding and acceptance.     Plan Discussed with: CRNA, Anesthesiologist and Surgeon  Anesthesia Plan Comments:         Anesthesia Quick Evaluation

## 2018-01-07 NOTE — H&P (Signed)
CC: Colostomy status-referred by Dr. Donne Hazel for evaluation of Hartmann's reversal  HPI: Mr. Mallon is a very pleasant 57 year old gentleman with history of tobacco abuse and diverticulitis presents for evaluation of Hartman's reversal. He underwent exploratory laparotomy and Hartman's procedure for diverticulitis with colocutaneous fistula on 05/13/17 by Dr. Donne Hazel. Pathology returned benign. His hospital course was complicated by acute hypoxic respiratory failure/ARDS and was intubated for a week for this. He also had issues with bilateral pleural effusions. He developed a AKI and required CRRT  PMH: Tobacco abuse-current and uncontrolled; alcohol abuse-reformed alcoholic; diverticulitis; COPD; malnutrition  PSH: Exploratory laparotomy with Hartman's for sigmoid stricture and colocutaneous fistula-Dr. Donne Hazel, 05/13/17  FHx: Denies FHx of malignancy  Social: 2 pack per day smoking history for 30 years; former alcoholic-states his last drink was October 2018. Denies illicit drug use  ROS: A comprehensive 10 system review of systems was completed with the patient and pertinent findings as noted above.  History reviewed. No pertinent past medical history.  Past Surgical History:  Procedure Laterality Date  . COLECTOMY WITH COLOSTOMY CREATION/HARTMANN PROCEDURE N/A 05/13/2017   Procedure: COLECTOMY WITH COLOSTOMY CREATION/HARTMANN PROCEDURE;  Surgeon: Rolm Bookbinder, MD;  Location: [facility];  Service: General;  Laterality: N/A;  . IR PARACENTESIS  05/05/2017  . IR THORACENTESIS ASP PLEURAL SPACE W/IMG GUIDE  04/23/2017    History reviewed. No pertinent family history.  Social:  reports that he has been smoking cigarettes.  He has been smoking about 3.00 packs per day. He has never used smokeless tobacco. He reports that he drinks about 10.8 oz of alcohol per week. He reports that he does not use drugs.  Allergies: No Known Allergies  Medications: I have reviewed the  patient's current medications.  No results found for this or any previous visit (from the past 48 hour(s)).  No results found.  ROS - all of the below systems have been reviewed with the patient and positives are indicated with bold text General: chills, fever or night sweats Eyes: blurry vision or double vision ENT: epistaxis or sore throat Allergy/Immunology: itchy/watery eyes or nasal congestion Hematologic/Lymphatic: bleeding problems, blood clots or swollen lymph nodes Endocrine: temperature intolerance or unexpected weight changes Breast: new or changing breast lumps or nipple discharge Resp: cough, shortness of breath, or wheezing CV: chest pain or dyspnea on exertion GI: as per HPI GU: dysuria, trouble voiding, or hematuria MSK: joint pain or joint stiffness Neuro: TIA or stroke symptoms Derm: pruritus and skin lesion changes Psych: anxiety and depression  PE Blood pressure (!) 126/91, pulse 100, temperature 98.5 F (36.9 C), temperature source Oral, resp. rate 15, height 5\' 9"  (1.753 m), weight 46.7 kg (103 lb), SpO2 100 %. Constitutional: NAD; conversant; no deformities Eyes: Moist conjunctiva; no lid lag; anicteric; PERRL Neck: Trachea midline; no thyromegaly Lungs: Normal respiratory effort; no tactile fremitus CV: RRR; no palpable thrills; no pitting edema GI: Abd soft, NT, ND; no palpable hepatosplenomegaly MSK: Normal gait; no clubbing/cyanosis Psychiatric: Appropriate affect; alert and oriented x3 Lymphatic: No palpable cervical or axillary lymphadenopathy   A/P: Mr. Nuon is a very pleasant 57 year old gentleman with history of tobacco abuse, alcohol abuse, COPD-underwent Hartman's 11/QA/184 articular stricture and coiled in his fistula. He had a prolonged hospitalization and multisystem organ failure including temporary need for dialysis as well as intubation  -Plan colonoscopy today -I discussed that he needs to quit smoking for multiple reasons and  certainly at least 6-8 weeks prior to explaining any colostomy reversal surgeries -The anatomy and  physiology of the GI tract was discussed at length with the patient. The pathophysiology of colon polyps and colostomies was discussed at length with associated pictures. -We discussed the planned procedure-colonoscopy, material risks (including, but not limited to, bleeding, perforation of colon, need for additional procedures, aspiration, pneumonia, heart attack, stroke, death) benefits and alternatives. His questions are answered to satisfaction, he voiced understanding and elected to proceed  Sharon Mt. Dema Severin, M.D. General and Colorectal Surgery [facility]

## 2018-01-07 NOTE — Op Note (Signed)
[facility] Patient Name: Dominic Phillips Procedure Date: 01/07/2018 MRN: [id number] Attending MD: Ileana Roup MD, MD Date of Birth: September 05, 1960 CSN: [id number] Age: 57 Admit Type: Outpatient Procedure:                Colonoscopy Indications:              Abnormal barium enema Providers:                Sharon Mt. Hazleigh Mccleave MD, MD, Carmie End, RN,                            Tinnie Gens, Technician, Rosario Adie, CRNA Referring MD:              Medicines:                Monitored Anesthesia Care Complications:            No immediate complications. Estimated Blood Loss:     Estimated blood loss was minimal. Procedure:                Pre-Anesthesia Assessment:                           - Prior to the procedure, a History and Physical                            was performed, and patient medications, allergies                            and sensitivities were reviewed. The patient's                            tolerance of previous anesthesia was reviewed.                           - The risks and benefits of the procedure and the                            sedation options and risks were discussed with the                            patient. All questions were answered and informed                            consent was obtained.                           - Patient identification and proposed procedure                            were verified prior to the procedure by the                            physician, the nurse and the anesthetist. The                            procedure was  verified in the pre-procedure area in                            the endoscopy suite.                           After obtaining informed consent, the colonoscope                            was passed under direct vision. Throughout the                            procedure, the patient's blood pressure, pulse, and                            oxygen saturations were monitored  continuously. The                            [id number]-[id number] ([id number] Olympus peds colonscope was                            introduced first through the anus into the distal                            sigmoid and then removed and insterted through the                            descending colostomy and advanced to the the cecum,                            identified by appendiceal orifice and ileocecal                            valve. The colonoscopy was performed without                            difficulty. The patient tolerated the procedure                            well. The quality of the bowel preparation was                            adequate. Scope In: 9:08:39 AM Scope Out: 9:58:55 AM Scope Withdrawal Time: 0 hours 14 minutes 43 seconds  Total Procedure Duration: 0 hours 50 minutes 16 seconds  Findings:      The perianal and digital rectal examinations were normal.      The perianal and digital rectal examinations were normal. Pertinent       negatives include no palpable rectal lesions. Scope was NOT able to be       advanced to end of Hartmann's pouch due to scarring/fibrosis and       imobility of distal sigmoid despite multiple attempts - likely from       prior infection/abscess. Fistula to rectum/Hartmann's pouch therefore       unable to be visualized.  A 5 mm polyp was found in the rectum. The polyp was umbilicated. The       polyp was removed with a saline injection-lift technique using a hot       snare. Resection and retrieval were complete. Estimated blood loss: none.      A 3 mm polyp was found in the ascending colon. The polyp was sessile.       The polyp was removed with a cold biopsy forceps. Resection and       retrieval were complete. Estimated blood loss was minimal.      A 3 mm polyp was found in the transverse colon. The polyp was sessile.       The polyp was removed with a jumbo cold forceps. Resection and retrieval       were complete. Estimated  blood loss was minimal.      A 2 mm polyp was found in the sigmoid colon. The polyp was sessile. The       polyp was removed with a jumbo cold forceps. Resection and retrieval       were complete. Estimated blood loss was minimal.      The exam was otherwise without abnormality on direct and retroflexion       views. Impression:               - One 5 mm polyp in the rectum, removed using                            injection-lift and a hot snare. Resected and                            retrieved.                           - The area in the rectum was tattooed around the                            site of the lift/polypectomy with 2cc of SPOT ink.                           - Two 2-3 mm polyps in the ascending colon, removed                            with a cold biopsy forceps. Resected and retrieved.                           - One 3 mm polyp in the transverse colon, removed                            with a jumbo cold forceps. Resected and retrieved.                           - One 2 mm polyp in the sigmoid colon, removed with                            a jumbo cold forceps. Resected and retrieved.                           -  The examination was otherwise normal on direct                            and retroflexion views. Moderate Sedation:      N/A- Per Anesthesia Care Recommendation:           - Discharge patient to home.                           - Resume previous diet.                           - No aspirin, ibuprofen, naproxen, or other                            non-steroidal anti-inflammatory drugs for 10 days                            after polyp removal.                           - Await pathology results.                           - Repeat colonoscopy date to be determined after                            pending pathology results are reviewed for                            surveillance.                           - Return to primary care physician at appointment                             to be scheduled. Procedure Code(s):        --- Professional ---                           [phone], Colonoscopy through stoma; with endoscopic                            mucosal resection                           [phone], 37, Colonoscopy through stoma; with biopsy,                            single or multiple Diagnosis Code(s):        --- Professional ---                           K62.1, Rectal polyp                           D12.2, Benign neoplasm of ascending colon  D12.3, Benign neoplasm of transverse colon (hepatic                            flexure or splenic flexure)                           D12.5, Benign neoplasm of sigmoid colon                           R93.3, Abnormal findings on diagnostic imaging of                            other parts of digestive tract CPT copyright 2017 American [employer]. All rights reserved. The codes documented in this report are preliminary and upon coder review may  be revised to meet current compliance requirements. Nadeen Landau, MD Ileana Roup MD, MD 01/07/2018 10:23:12 AM This report has been signed electronically. Number of Addenda: 0

## 2018-01-07 NOTE — Transfer of Care (Signed)
Immediate Anesthesia Transfer of Care Note  Patient: Abdou Stocks  Procedure(s) Performed: COLONOSCOPY WITH PROPOFOL  ELEVIEW SPOT (N/A ) POLYPECTOMY  Patient Location: PACU  Anesthesia Type:MAC  Level of Consciousness: awake, alert  and oriented  Airway & Oxygen Therapy: Patient Spontanous Breathing and Patient connected to face mask oxygen  Post-op Assessment: Report given to RN and Post -op Vital signs reviewed and stable  Post vital signs: Reviewed and stable  Last Vitals:  Vitals Value Taken Time  BP    Temp    Pulse 72 01/07/2018 10:11 AM  Resp 14 01/07/2018 10:11 AM  SpO2 100 % 01/07/2018 10:11 AM  Vitals shown include unvalidated device data.  Last Pain:  Vitals:   01/07/18 0752  TempSrc: Oral  PainSc: 0-No pain         Complications: No apparent anesthesia complications

## 2018-01-08 NOTE — Anesthesia Postprocedure Evaluation (Signed)
Anesthesia Post Note  Patient: Dominic Phillips  Procedure(s) Performed: COLONOSCOPY WITH PROPOFOL  ELEVIEW SPOT (N/A ) POLYPECTOMY     Patient location during evaluation: Endoscopy Anesthesia Type: MAC Level of consciousness: awake and alert Pain management: pain level controlled Vital Signs Assessment: post-procedure vital signs reviewed and stable Respiratory status: spontaneous breathing, nonlabored ventilation, respiratory function stable and patient connected to nasal cannula oxygen Cardiovascular status: blood pressure returned to baseline and stable Postop Assessment: no apparent nausea or vomiting Anesthetic complications: no    Last Vitals:  Vitals:   01/07/18 1030 01/07/18 1033  BP: (!) 142/117 129/69  Pulse: 65 64  Resp: 18 14  Temp:    SpO2: 100% 99%    Last Pain:  Vitals:   01/07/18 1033  TempSrc:   PainSc: 0-No pain                 Adithi Gammon S

## 2018-07-10 IMAGING — DX DG CHEST 2V
2 series · 2 of 2 positions shown · non-contrast
Comparison: 04/30/2017

CLINICAL DATA: Hospital acquired pneumonia

EXAM:
CHEST  2 VIEW

[x chest ap]
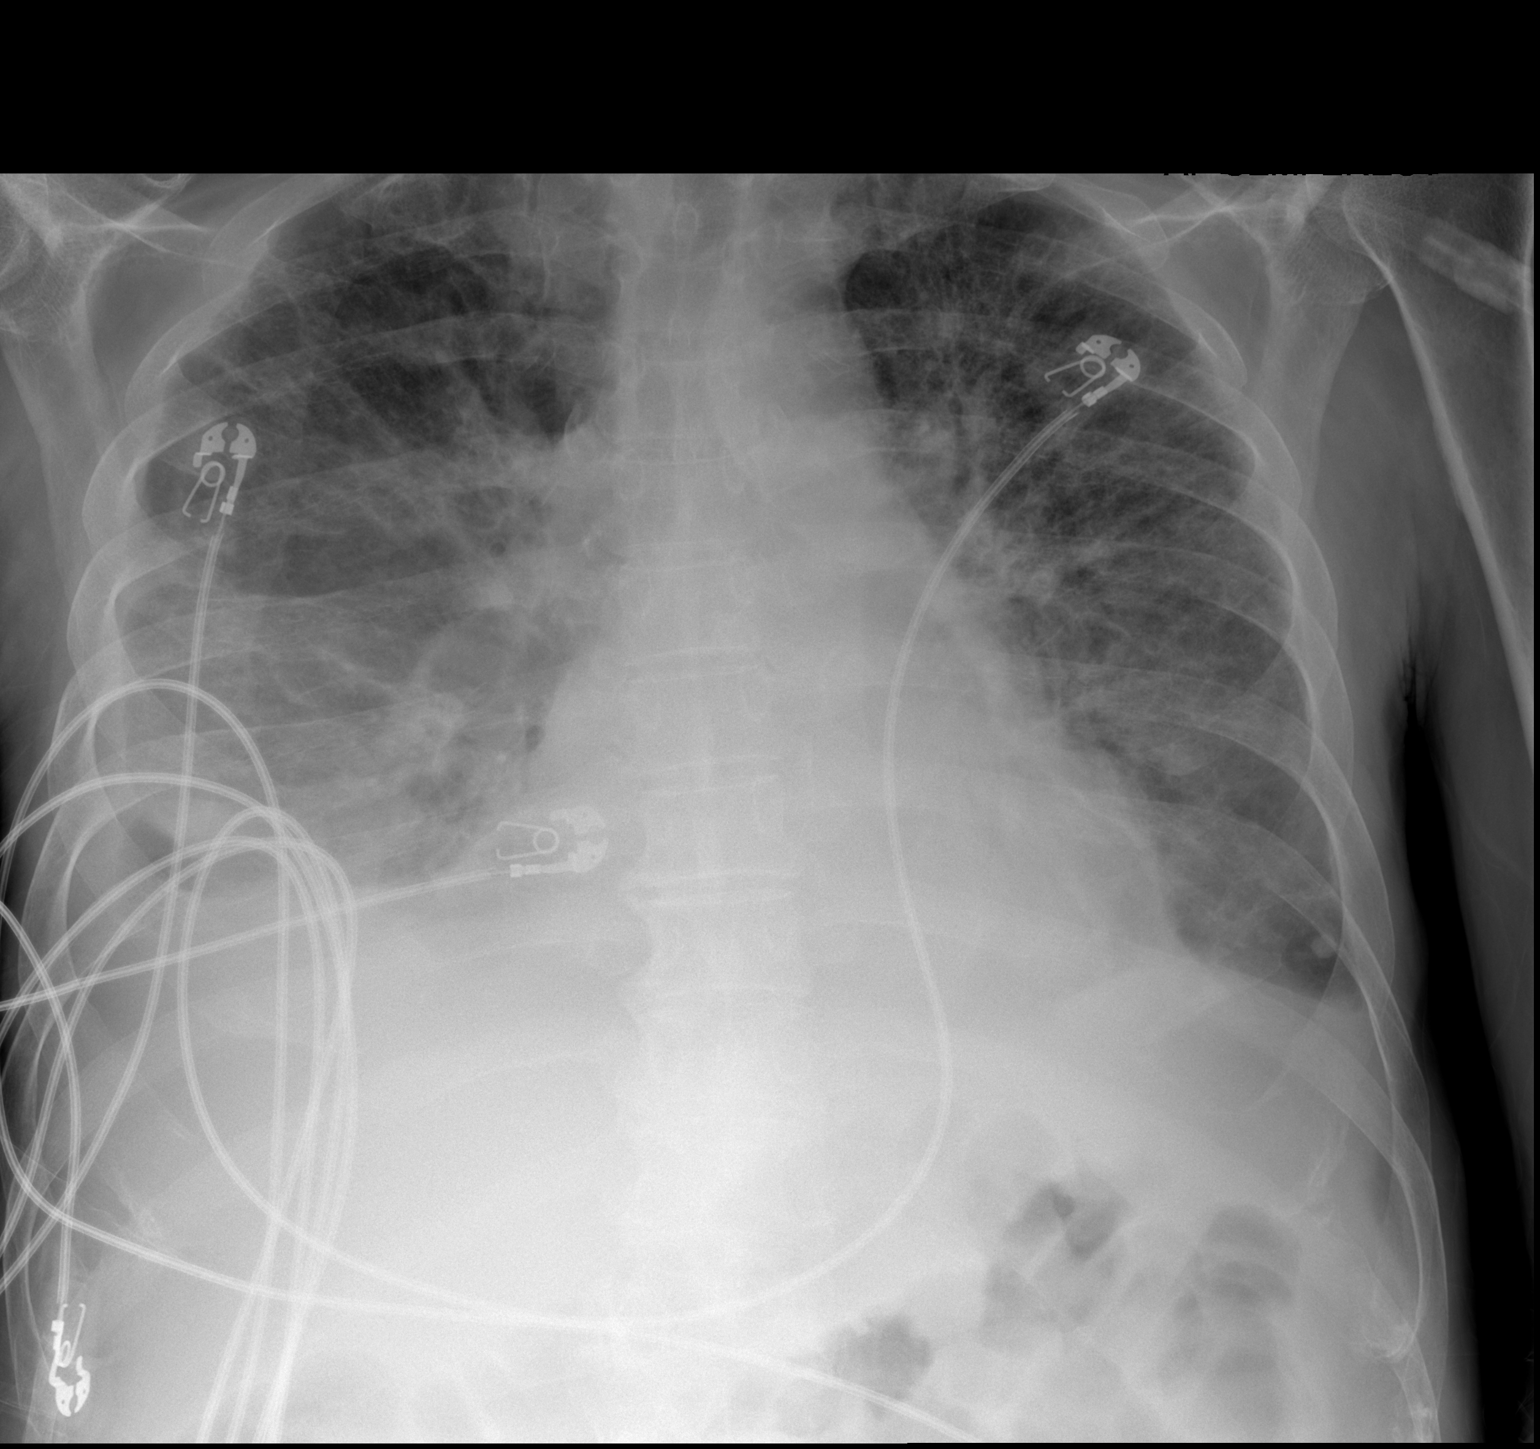

[w chest lat]
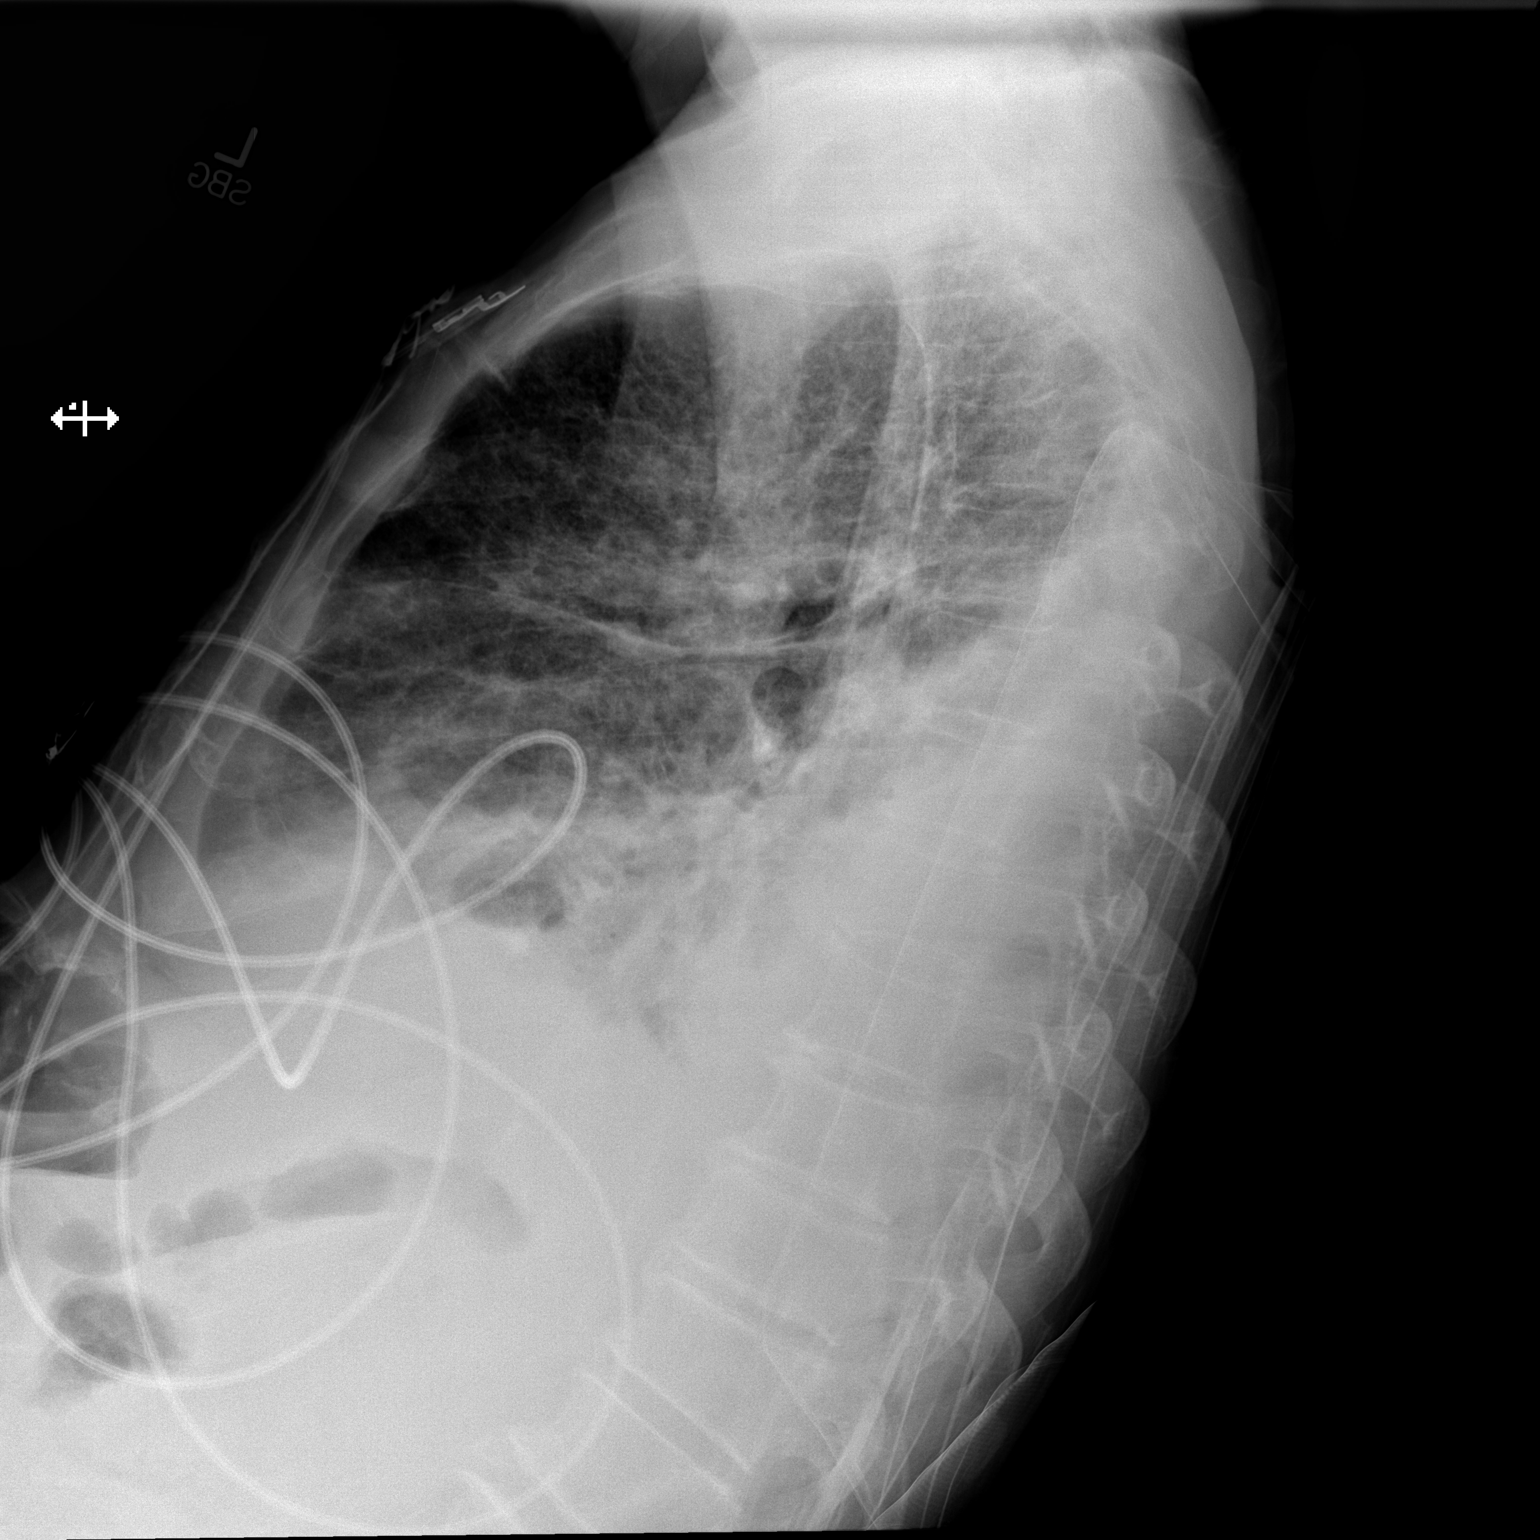

[2 of 2 positions shown; findings below may reference images not displayed]

FINDINGS: Diffuse bilateral airspace opacities have worsened since prior
study. Heart is upper limits normal in size. Small bilateral pleural
effusions, right greater than left.
IMPRESSION: Worsening diffuse bilateral airspace disease which could represent
infection or edema.

Small bilateral effusions.

## 2018-07-12 IMAGING — CT CT ABD-PELV W/O CM
2 of 4 series · 16 of 46 positions shown, 18 images · non-contrast
Comparison: 04/19/2017

CLINICAL DATA: Diverticulitis post perforation and abscess, post
drainage procedure, follow-up, question and colocutaneous fistula,
mild leukocytosis

EXAM:
CT ABDOMEN AND PELVIS WITHOUT CONTRAST
TECHNIQUE: Multidetector CT imaging of the abdomen and pelvis was performed
following the standard protocol without IV contrast. Sagittal and
coronal MPR images reconstructed from axial data set. Patient drank
dilute oral contrast for exam.

[Series 3: a/p w/o 5mm · axial · non-contrast · 0.75mm/px · z∈[+705,+1145]mm · 13 of 97 slices shown, 15 images]
[im 5/97  soft-tissue]
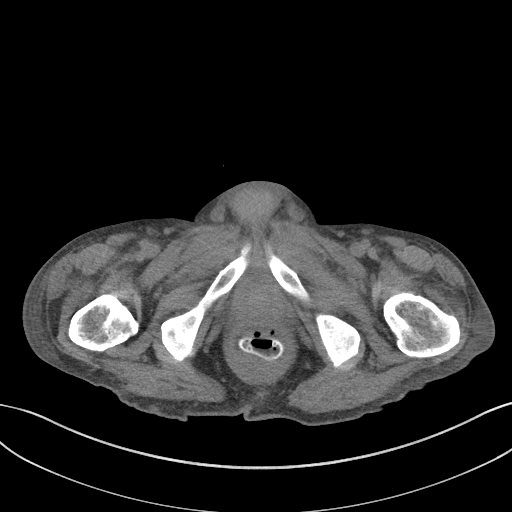
[im 5/97  bone]
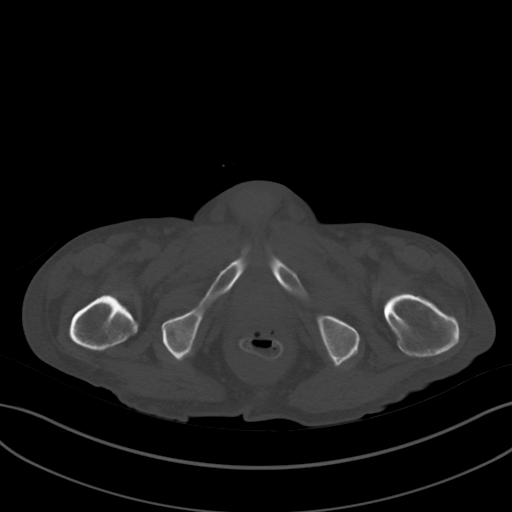
[im 13/97  soft-tissue]
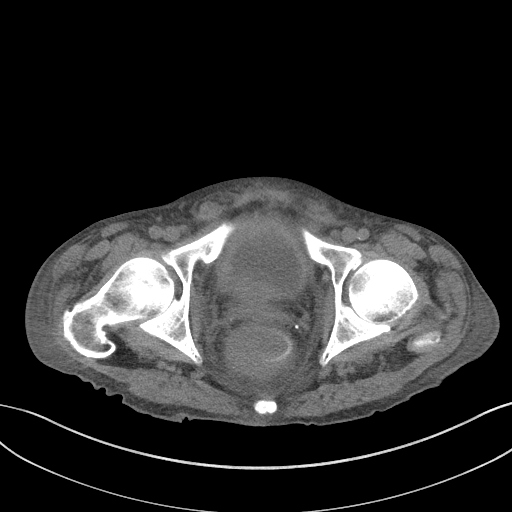
[im 21/97  soft-tissue]
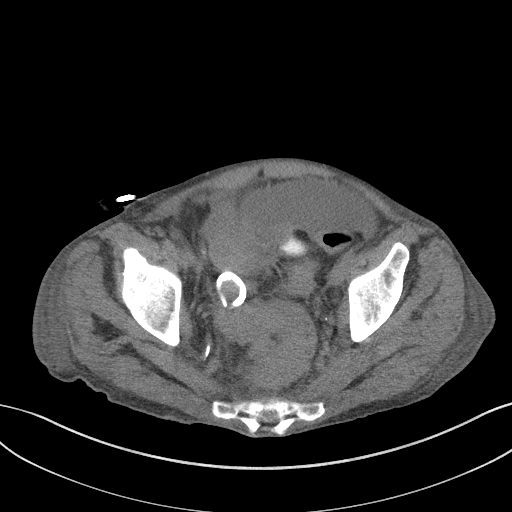
[im 29/97  soft-tissue]
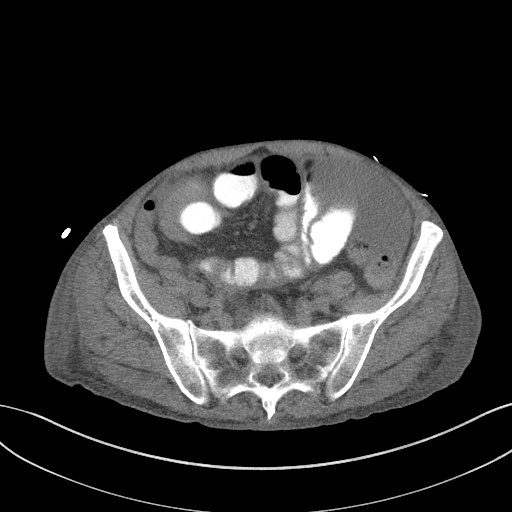
[im 33/97  soft-tissue]
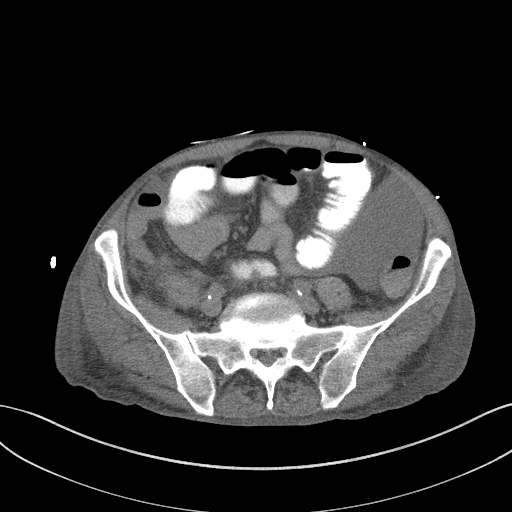
[im 41/97  soft-tissue]
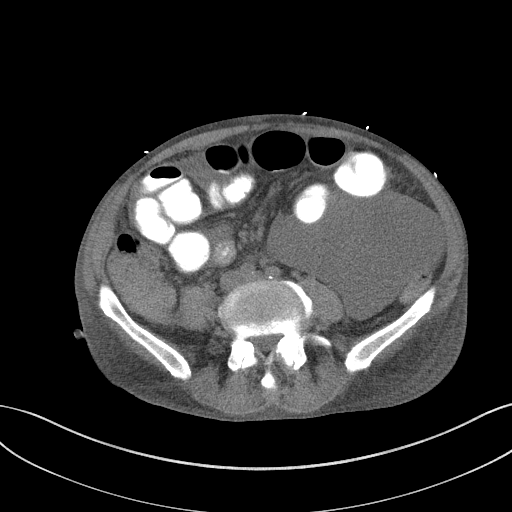
[im 49/97  soft-tissue]
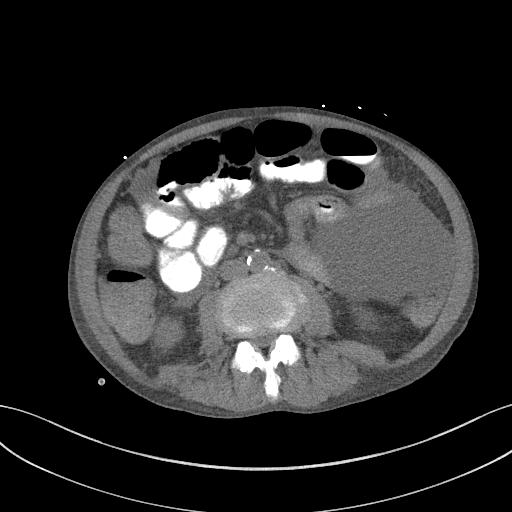
[im 57/97  soft-tissue]
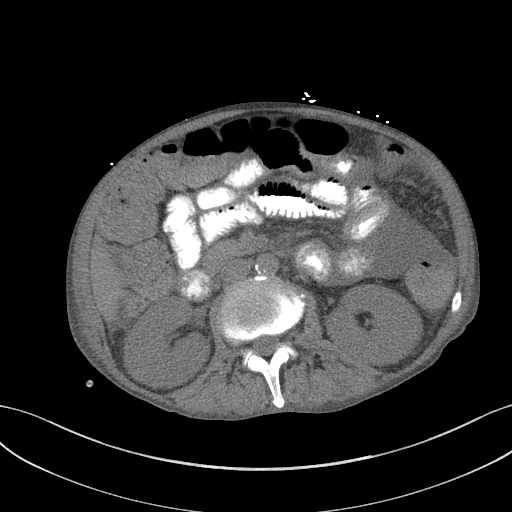
[im 65/97  soft-tissue]
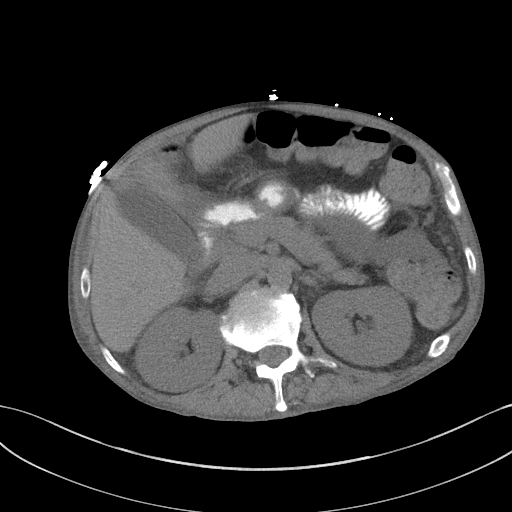
[im 65/97  bone]
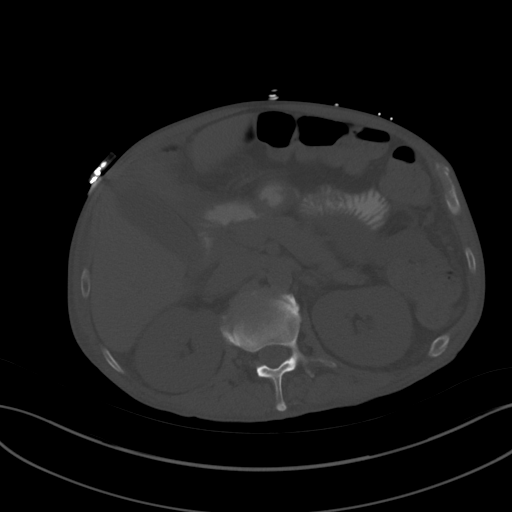
[im 69/97  soft-tissue]
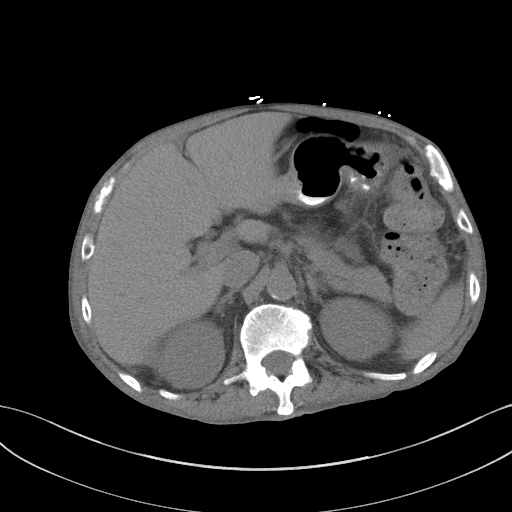
[im 77/97  soft-tissue]
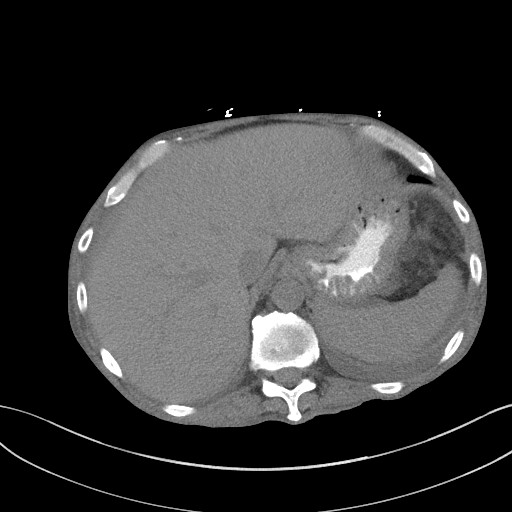
[im 85/97  soft-tissue]
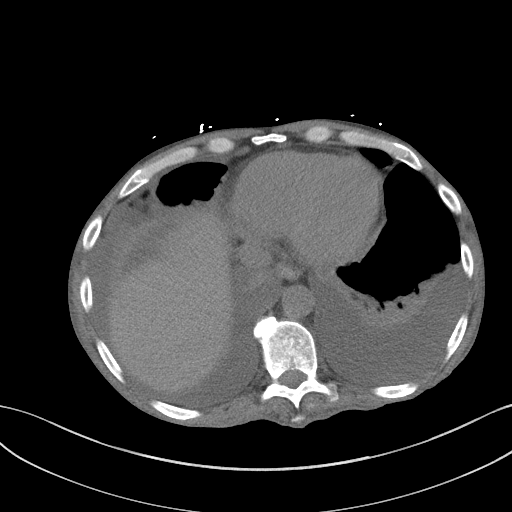
[im 93/97  soft-tissue]
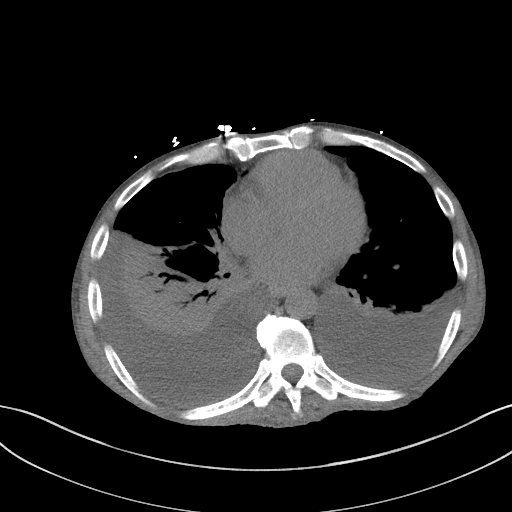

[Series 6: a/p w/o cor · coronal · non-contrast · 0.78mm/px · 3 of 151 slices shown]
[im 51/151  soft-tissue]
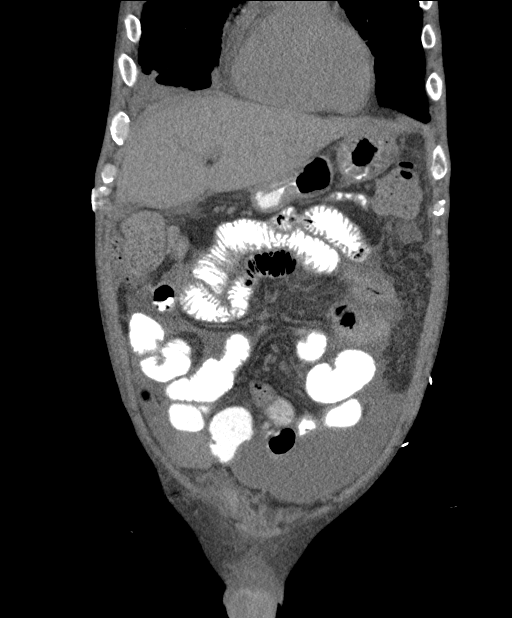
[im 67/151  soft-tissue]
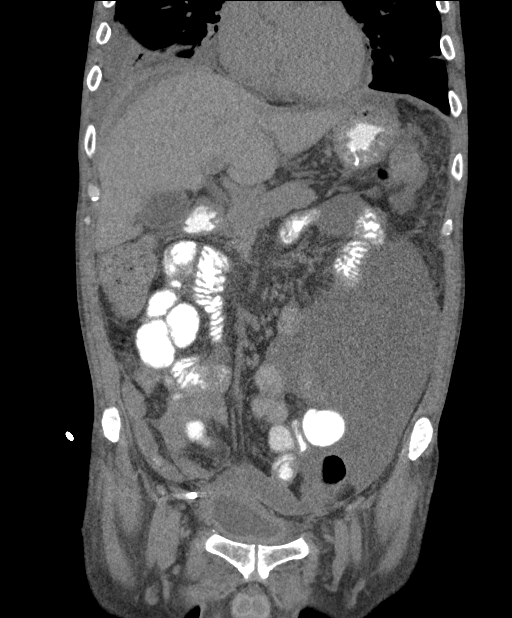
[im 84/151  soft-tissue]
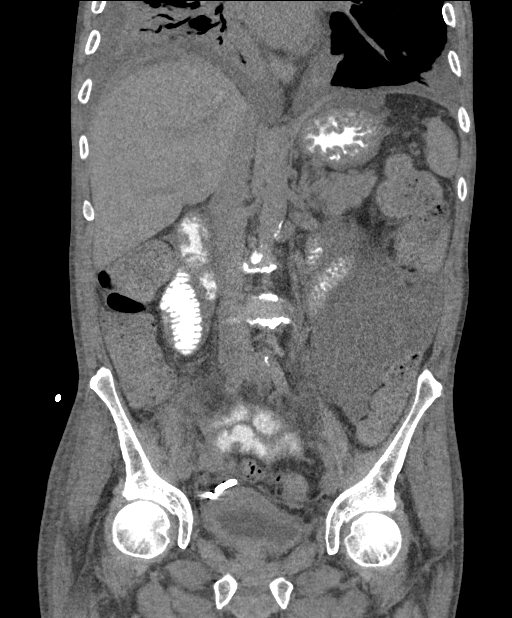

[16 of 46 positions shown; findings below may reference images not displayed]

FINDINGS: Lower chest: Moderate BILATERAL pleural effusions. Compressive
atelectasis of the lower lobes with question consolidation in RIGHT
lower lobe.

Hepatobiliary: Gallbladder and liver normal appearance

Pancreas: Normal appearance

Spleen: Normal appearance

Adrenals/Urinary Tract: Adrenal glands, kidneys, and ureters normal
appearance. Bladder is incompletely distended. Bladder wall appears
mildly thickened, question due to true wall thickening such as from
prior pelvic inflammation or an artifact from underdistention

Stomach/Bowel: Rectal tube with question mild rectal wall
thickening. Sigmoid diverticulosis changes. Remainder of colon and
small bowel loops unremarkable. Stomach decompressed.

Vascular/Lymphatic: Atherosclerotic calcifications aorta and iliac
arteries without aneurysm. No definite adenopathy.

Reproductive: Minimal prostatic enlargement. Seminal vesicles
unremarkable.

Other: Pigtail drainage catheter at site of prior abscess without
residual associated fluid collection. Scattered free fluid with a
focal area of prominent fluid in the lateral LEFT mid abdomen,
question free fluid versus loculated, appears to be displacing bowel
loops, collection of fluid at this site overall measuring
approximately 13.3 x 9.7 x 16.6 cm. No definite free intraperitoneal
air. No hernia.

Musculoskeletal: Remarkable
IMPRESSION: No residual fluid collection at site of pigtail drainage catheter in
RIGHT pelvis.

Moderate BILATERAL pleural effusions with compressive atelectasis of
both lower lobes and question RIGHT lower lobe consolidation.

Scattered ascites with question loculated versus 3 collection in the
LEFT mid abdomen, appears to be the displacing bowel loops, overall
13.3 x 9.7 x 16.6 cm in size ; this could represent sterile or
infected fluid and if an infected collection is suspected consider
aspiration.

## 2020-09-29 ENCOUNTER — Inpatient Hospital Stay (HOSPITAL_COMMUNITY): Payer: Medicaid Other

## 2020-09-29 ENCOUNTER — Emergency Department (HOSPITAL_COMMUNITY): Payer: Medicaid Other

## 2020-09-29 ENCOUNTER — Inpatient Hospital Stay (HOSPITAL_COMMUNITY)
Admission: EM | Admit: 2020-09-29 | Discharge: 2020-10-14 | DRG: 870 | Disposition: E | Payer: Medicaid Other | Attending: Pulmonary Disease | Admitting: Pulmonary Disease

## 2020-09-29 DIAGNOSIS — D689 Coagulation defect, unspecified: Secondary | ICD-10-CM | POA: Diagnosis present

## 2020-09-29 DIAGNOSIS — A419 Sepsis, unspecified organism: Principal | ICD-10-CM | POA: Diagnosis present

## 2020-09-29 DIAGNOSIS — E860 Dehydration: Secondary | ICD-10-CM | POA: Diagnosis present

## 2020-09-29 DIAGNOSIS — R579 Shock, unspecified: Secondary | ICD-10-CM | POA: Diagnosis present

## 2020-09-29 DIAGNOSIS — E872 Acidosis, unspecified: Secondary | ICD-10-CM

## 2020-09-29 DIAGNOSIS — Z681 Body mass index (BMI) 19 or less, adult: Secondary | ICD-10-CM

## 2020-09-29 DIAGNOSIS — Z20822 Contact with and (suspected) exposure to covid-19: Secondary | ICD-10-CM | POA: Diagnosis present

## 2020-09-29 DIAGNOSIS — Z7189 Other specified counseling: Secondary | ICD-10-CM | POA: Diagnosis not present

## 2020-09-29 DIAGNOSIS — Z515 Encounter for palliative care: Secondary | ICD-10-CM

## 2020-09-29 DIAGNOSIS — N432 Other hydrocele: Secondary | ICD-10-CM | POA: Diagnosis present

## 2020-09-29 DIAGNOSIS — R8290 Unspecified abnormal findings in urine: Secondary | ICD-10-CM

## 2020-09-29 DIAGNOSIS — R571 Hypovolemic shock: Secondary | ICD-10-CM | POA: Diagnosis present

## 2020-09-29 DIAGNOSIS — R319 Hematuria, unspecified: Secondary | ICD-10-CM | POA: Diagnosis present

## 2020-09-29 DIAGNOSIS — Z66 Do not resuscitate: Secondary | ICD-10-CM | POA: Diagnosis not present

## 2020-09-29 DIAGNOSIS — D6959 Other secondary thrombocytopenia: Secondary | ICD-10-CM | POA: Diagnosis present

## 2020-09-29 DIAGNOSIS — F17210 Nicotine dependence, cigarettes, uncomplicated: Secondary | ICD-10-CM | POA: Diagnosis present

## 2020-09-29 DIAGNOSIS — R6521 Severe sepsis with septic shock: Secondary | ICD-10-CM | POA: Diagnosis present

## 2020-09-29 DIAGNOSIS — Z9049 Acquired absence of other specified parts of digestive tract: Secondary | ICD-10-CM

## 2020-09-29 DIAGNOSIS — E8770 Fluid overload, unspecified: Secondary | ICD-10-CM | POA: Diagnosis present

## 2020-09-29 DIAGNOSIS — E871 Hypo-osmolality and hyponatremia: Secondary | ICD-10-CM | POA: Diagnosis present

## 2020-09-29 DIAGNOSIS — F1010 Alcohol abuse, uncomplicated: Secondary | ICD-10-CM | POA: Diagnosis present

## 2020-09-29 DIAGNOSIS — K529 Noninfective gastroenteritis and colitis, unspecified: Secondary | ICD-10-CM | POA: Diagnosis not present

## 2020-09-29 DIAGNOSIS — I472 Ventricular tachycardia: Secondary | ICD-10-CM | POA: Diagnosis not present

## 2020-09-29 DIAGNOSIS — K7200 Acute and subacute hepatic failure without coma: Secondary | ICD-10-CM | POA: Diagnosis not present

## 2020-09-29 DIAGNOSIS — I4891 Unspecified atrial fibrillation: Secondary | ICD-10-CM | POA: Diagnosis present

## 2020-09-29 DIAGNOSIS — J9601 Acute respiratory failure with hypoxia: Secondary | ICD-10-CM | POA: Diagnosis present

## 2020-09-29 DIAGNOSIS — K420 Umbilical hernia with obstruction, without gangrene: Secondary | ICD-10-CM | POA: Diagnosis present

## 2020-09-29 DIAGNOSIS — K7460 Unspecified cirrhosis of liver: Secondary | ICD-10-CM | POA: Diagnosis present

## 2020-09-29 DIAGNOSIS — Z933 Colostomy status: Secondary | ICD-10-CM

## 2020-09-29 DIAGNOSIS — E162 Hypoglycemia, unspecified: Secondary | ICD-10-CM | POA: Diagnosis present

## 2020-09-29 DIAGNOSIS — E876 Hypokalemia: Secondary | ICD-10-CM | POA: Diagnosis present

## 2020-09-29 DIAGNOSIS — M3581 Multisystem inflammatory syndrome: Secondary | ICD-10-CM

## 2020-09-29 DIAGNOSIS — L89220 Pressure ulcer of left hip, unstageable: Secondary | ICD-10-CM | POA: Diagnosis not present

## 2020-09-29 DIAGNOSIS — G9341 Metabolic encephalopathy: Secondary | ICD-10-CM | POA: Diagnosis present

## 2020-09-29 DIAGNOSIS — J690 Pneumonitis due to inhalation of food and vomit: Secondary | ICD-10-CM | POA: Diagnosis present

## 2020-09-29 DIAGNOSIS — J449 Chronic obstructive pulmonary disease, unspecified: Secondary | ICD-10-CM | POA: Diagnosis present

## 2020-09-29 DIAGNOSIS — N179 Acute kidney failure, unspecified: Secondary | ICD-10-CM | POA: Diagnosis present

## 2020-09-29 DIAGNOSIS — Z0189 Encounter for other specified special examinations: Secondary | ICD-10-CM

## 2020-09-29 DIAGNOSIS — E875 Hyperkalemia: Secondary | ICD-10-CM | POA: Diagnosis not present

## 2020-09-29 DIAGNOSIS — R9431 Abnormal electrocardiogram [ECG] [EKG]: Secondary | ICD-10-CM | POA: Diagnosis not present

## 2020-09-29 DIAGNOSIS — Z452 Encounter for adjustment and management of vascular access device: Secondary | ICD-10-CM

## 2020-09-29 DIAGNOSIS — R140 Abdominal distension (gaseous): Secondary | ICD-10-CM

## 2020-09-29 DIAGNOSIS — S3022XA Contusion of scrotum and testes, initial encounter: Secondary | ICD-10-CM

## 2020-09-29 DIAGNOSIS — I361 Nonrheumatic tricuspid (valve) insufficiency: Secondary | ICD-10-CM | POA: Diagnosis not present

## 2020-09-29 DIAGNOSIS — E43 Unspecified severe protein-calorie malnutrition: Secondary | ICD-10-CM | POA: Diagnosis present

## 2020-09-29 DIAGNOSIS — R627 Adult failure to thrive: Secondary | ICD-10-CM | POA: Diagnosis present

## 2020-09-29 DIAGNOSIS — L8990 Pressure ulcer of unspecified site, unspecified stage: Secondary | ICD-10-CM | POA: Diagnosis present

## 2020-09-29 DIAGNOSIS — L89151 Pressure ulcer of sacral region, stage 1: Secondary | ICD-10-CM | POA: Diagnosis present

## 2020-09-29 DIAGNOSIS — N5089 Other specified disorders of the male genital organs: Secondary | ICD-10-CM | POA: Diagnosis present

## 2020-09-29 LAB — CBC WITH DIFFERENTIAL/PLATELET
Abs Immature Granulocytes: 1.56 10*3/uL — ABNORMAL HIGH (ref 0.00–0.07)
Abs Immature Granulocytes: 7.33 10*3/uL — ABNORMAL HIGH (ref 0.00–0.07)
Abs Immature Granulocytes: 0.01 10*3/uL (ref 0.00–0.07)
Basophils Absolute: 0 10*3/uL (ref 0.0–0.1)
Basophils Absolute: 0.1 10*3/uL (ref 0.0–0.1)
Basophils Absolute: 0.1 10*3/uL (ref 0.0–0.1)
Basophils Relative: 0 %
Basophils Relative: 1 %
Basophils Relative: 1 %
Eosinophils Absolute: 0 10*3/uL (ref 0.0–0.5)
Eosinophils Absolute: 0 10*3/uL (ref 0.0–0.5)
Eosinophils Absolute: 0 10*3/uL (ref 0.0–0.5)
Eosinophils Relative: 0 %
Eosinophils Relative: 0 %
Eosinophils Relative: 0 %
HCT: 20.7 % — ABNORMAL LOW (ref 39.0–52.0)
HCT: 20.9 % — ABNORMAL LOW (ref 39.0–52.0)
Hemoglobin: 9 g/dL — ABNORMAL LOW (ref 13.0–17.0)
Hemoglobin: 7.3 g/dL — ABNORMAL LOW (ref 13.0–17.0)
Hemoglobin: 7.7 g/dL — ABNORMAL LOW (ref 13.0–17.0)
Immature Granulocytes: 9 %
Immature Granulocytes: 70 %
Immature Granulocytes: 0 %
Lymphocytes Relative: 5 %
Lymphocytes Relative: 3 %
Lymphocytes Relative: 3 %
Lymphs Abs: 0.8 10*3/uL (ref 0.7–4.0)
Lymphs Abs: 0.4 10*3/uL — ABNORMAL LOW (ref 0.7–4.0)
Lymphs Abs: 0.3 10*3/uL — ABNORMAL LOW (ref 0.7–4.0)
MCH: 35.3 pg — ABNORMAL HIGH (ref 26.0–34.0)
MCH: 36 pg — ABNORMAL HIGH (ref 26.0–34.0)
MCHC: 35.3 g/dL (ref 30.0–36.0)
MCHC: 36.8 g/dL — ABNORMAL HIGH (ref 30.0–36.0)
MCV: 100 fL (ref 80.0–100.0)
MCV: 97.7 fL (ref 80.0–100.0)
Monocytes Absolute: 0.5 10*3/uL (ref 0.1–1.0)
Monocytes Absolute: 0.2 10*3/uL (ref 0.1–1.0)
Monocytes Absolute: 0.1 10*3/uL (ref 0.1–1.0)
Monocytes Relative: 3 %
Monocytes Relative: 2 %
Monocytes Relative: 1 %
Neutro Abs: 14 10*3/uL — ABNORMAL HIGH (ref 1.7–7.7)
Neutro Abs: 2.5 10*3/uL (ref 1.7–7.7)
Neutro Abs: 7.4 10*3/uL (ref 1.7–7.7)
Neutrophils Relative %: 83 %
Neutrophils Relative %: 24 %
Neutrophils Relative %: 95 %
Platelets: 149 10*3/uL — ABNORMAL LOW (ref 150–400)
Platelets: 99 10*3/uL — ABNORMAL LOW (ref 150–400)
Platelets: 100 10*3/uL — ABNORMAL LOW (ref 150–400)
RBC: 2.07 MIL/uL — ABNORMAL LOW (ref 4.22–5.81)
RBC: 2.14 MIL/uL — ABNORMAL LOW (ref 4.22–5.81)
RDW: 18.6 % — ABNORMAL HIGH (ref 11.5–15.5)
RDW: 16.9 % — ABNORMAL HIGH (ref 11.5–15.5)
WBC Morphology: INCREASED
WBC Morphology: INCREASED
WBC: 16.9 10*3/uL — ABNORMAL HIGH (ref 4.0–10.5)
WBC: 10.4 10*3/uL (ref 4.0–10.5)
WBC: 7.8 10*3/uL (ref 4.0–10.5)
nRBC: 0.4 % — ABNORMAL HIGH (ref 0.0–0.2)
nRBC: 0.4 % — ABNORMAL HIGH (ref 0.0–0.2)
nRBC: 0.5 % — ABNORMAL HIGH (ref 0.0–0.2)

## 2020-09-29 LAB — I-STAT VENOUS BLOOD GAS, ED
Acid-base deficit: 21 mmol/L — ABNORMAL HIGH (ref 0.0–2.0)
Acid-base deficit: 21 mmol/L — ABNORMAL HIGH (ref 0.0–2.0)
Acid-base deficit: 21 mmol/L — ABNORMAL HIGH (ref 0.0–2.0)
Bicarbonate: 7.3 mmol/L — ABNORMAL LOW (ref 20.0–28.0)
Bicarbonate: 7.4 mmol/L — ABNORMAL LOW (ref 20.0–28.0)
Bicarbonate: 5.9 mmol/L — ABNORMAL LOW (ref 20.0–28.0)
Calcium, Ion: 0.83 mmol/L — CL (ref 1.15–1.40)
Calcium, Ion: 0.84 mmol/L — CL (ref 1.15–1.40)
Calcium, Ion: 0.72 mmol/L — CL (ref 1.15–1.40)
HCT: 27 % — ABNORMAL LOW (ref 39.0–52.0)
HCT: 28 % — ABNORMAL LOW (ref 39.0–52.0)
HCT: 28 % — ABNORMAL LOW (ref 39.0–52.0)
Hemoglobin: 9.2 g/dL — ABNORMAL LOW (ref 13.0–17.0)
Hemoglobin: 9.5 g/dL — ABNORMAL LOW (ref 13.0–17.0)
Hemoglobin: 9.5 g/dL — ABNORMAL LOW (ref 13.0–17.0)
O2 Saturation: 73 %
O2 Saturation: 88 %
O2 Saturation: 99 %
Potassium: <2 mmol/L — CL (ref 3.5–5.1)
Potassium: <2 mmol/L — CL (ref 3.5–5.1)
Potassium: <2 mmol/L — CL (ref 3.5–5.1)
Sodium: 118 mmol/L — CL (ref 135–145)
Sodium: 118 mmol/L — CL (ref 135–145)
Sodium: 116 mmol/L — CL (ref 135–145)
TCO2: 8 mmol/L — ABNORMAL LOW (ref 22–32)
TCO2: 8 mmol/L — ABNORMAL LOW (ref 22–32)
TCO2: 6 mmol/L — ABNORMAL LOW (ref 22–32)
pCO2, Ven: 24 mmHg — ABNORMAL LOW (ref 44.0–60.0)
pCO2, Ven: 25 mmHg — ABNORMAL LOW (ref 44.0–60.0)
pCO2, Ven: 16.1 mmHg — CL (ref 44.0–60.0)
pH, Ven: 7.074 — CL (ref 7.250–7.430)
pH, Ven: 7.096 — CL (ref 7.250–7.430)
pH, Ven: 7.17 — CL (ref 7.250–7.430)
pO2, Ven: 51 mmHg — ABNORMAL HIGH (ref 32.0–45.0)
pO2, Ven: 74 mmHg — ABNORMAL HIGH (ref 32.0–45.0)
pO2, Ven: 149 mmHg — ABNORMAL HIGH (ref 32.0–45.0)

## 2020-09-29 LAB — CBG MONITORING, ED
Glucose-Capillary: 54 mg/dL — ABNORMAL LOW (ref 70–99)
Glucose-Capillary: 128 mg/dL — ABNORMAL HIGH (ref 70–99)
Glucose-Capillary: 89 mg/dL (ref 70–99)

## 2020-09-29 LAB — URINALYSIS, ROUTINE W REFLEX MICROSCOPIC
Bilirubin Urine: NEGATIVE
Glucose, UA: NEGATIVE mg/dL
Ketones, ur: 5 mg/dL — AB
Nitrite: NEGATIVE
Protein, ur: >=300 mg/dL — AB
RBC / HPF: >50 RBC/hpf — ABNORMAL HIGH (ref 0–5)
Specific Gravity, Urine: 1.014 (ref 1.005–1.030)
WBC, UA: >50 WBC/hpf — ABNORMAL HIGH (ref 0–5)
pH: 7 (ref 5.0–8.0)

## 2020-09-29 LAB — LACTIC ACID, PLASMA
Lactic Acid, Venous: 1.5 mmol/L (ref 0.5–1.9)
Lactic Acid, Venous: 2 mmol/L (ref 0.5–1.9)
Lactic Acid, Venous: 1.6 mmol/L (ref 0.5–1.9)

## 2020-09-29 LAB — PROTIME-INR
INR: 2.1 — ABNORMAL HIGH (ref 0.8–1.2)
INR: 3.3 — ABNORMAL HIGH (ref 0.8–1.2)
INR: 2.5 — ABNORMAL HIGH (ref 0.8–1.2)
Prothrombin Time: 23.8 seconds — ABNORMAL HIGH (ref 11.4–15.2)
Prothrombin Time: 33.2 seconds — ABNORMAL HIGH (ref 11.4–15.2)
Prothrombin Time: 27.2 seconds — ABNORMAL HIGH (ref 11.4–15.2)

## 2020-09-29 LAB — COMPREHENSIVE METABOLIC PANEL
ALT: 27 U/L (ref 0–44)
ALT: 24 U/L (ref 0–44)
ALT: 22 U/L (ref 0–44)
AST: 61 U/L — ABNORMAL HIGH (ref 15–41)
AST: 56 U/L — ABNORMAL HIGH (ref 15–41)
AST: 51 U/L — ABNORMAL HIGH (ref 15–41)
Albumin: 1.2 g/dL — ABNORMAL LOW (ref 3.5–5.0)
Albumin: 1 g/dL — ABNORMAL LOW (ref 3.5–5.0)
Albumin: 1.7 g/dL — ABNORMAL LOW (ref 3.5–5.0)
Alkaline Phosphatase: 143 U/L — ABNORMAL HIGH (ref 38–126)
Alkaline Phosphatase: 129 U/L — ABNORMAL HIGH (ref 38–126)
Alkaline Phosphatase: 122 U/L (ref 38–126)
Anion gap: 27 — ABNORMAL HIGH (ref 5–15)
Anion gap: 18 — ABNORMAL HIGH (ref 5–15)
BUN: 53 mg/dL — ABNORMAL HIGH (ref 6–20)
BUN: 38 mg/dL — ABNORMAL HIGH (ref 6–20)
BUN: 43 mg/dL — ABNORMAL HIGH (ref 6–20)
CO2: 7 mmol/L — ABNORMAL LOW (ref 22–32)
CO2: 8 mmol/L — ABNORMAL LOW (ref 22–32)
CO2: 9 mmol/L — ABNORMAL LOW (ref 22–32)
Calcium: 5.9 mg/dL — CL (ref 8.9–10.3)
Calcium: 4.5 mg/dL — CL (ref 8.9–10.3)
Calcium: 5.1 mg/dL — CL (ref 8.9–10.3)
Chloride: 83 mmol/L — ABNORMAL LOW (ref 98–111)
Chloride: 77 mmol/L — ABNORMAL LOW (ref 98–111)
Chloride: 94 mmol/L — ABNORMAL LOW (ref 98–111)
Creatinine, Ser: 4.24 mg/dL — ABNORMAL HIGH (ref 0.61–1.24)
Creatinine, Ser: 2.77 mg/dL — ABNORMAL HIGH (ref 0.61–1.24)
Creatinine, Ser: 2.95 mg/dL — ABNORMAL HIGH (ref 0.61–1.24)
GFR, Estimated: 16 mL/min — ABNORMAL LOW (ref >60)
GFR, Estimated: 26 mL/min — ABNORMAL LOW (ref >60)
GFR, Estimated: 24 mL/min — ABNORMAL LOW (ref >60)
Glucose, Bld: 106 mg/dL — ABNORMAL HIGH (ref 70–99)
Glucose, Bld: 929 mg/dL (ref 70–99)
Glucose, Bld: 123 mg/dL — ABNORMAL HIGH (ref 70–99)
Potassium: <2 mmol/L — CL (ref 3.5–5.1)
Potassium: <2 mmol/L — CL (ref 3.5–5.1)
Potassium: <2 mmol/L — CL (ref 3.5–5.1)
Sodium: 117 mmol/L — CL (ref 135–145)
Sodium: <102 mmol/L — CL (ref 135–145)
Sodium: 121 mmol/L — ABNORMAL LOW (ref 135–145)
Total Bilirubin: 1.3 mg/dL — ABNORMAL HIGH (ref 0.3–1.2)
Total Bilirubin: 0.7 mg/dL (ref 0.3–1.2)
Total Bilirubin: 0.9 mg/dL (ref 0.3–1.2)
Total Protein: 3.8 g/dL — ABNORMAL LOW (ref 6.5–8.1)
Total Protein: 3.3 g/dL — ABNORMAL LOW (ref 6.5–8.1)
Total Protein: 3.8 g/dL — ABNORMAL LOW (ref 6.5–8.1)

## 2020-09-29 LAB — LIPASE, BLOOD: Lipase: 63 U/L — ABNORMAL HIGH (ref 11–51)

## 2020-09-29 LAB — RESP PANEL BY RT-PCR (FLU A&B, COVID) ARPGX2
Influenza A by PCR: NEGATIVE
Influenza B by PCR: NEGATIVE
SARS Coronavirus 2 by RT PCR: NEGATIVE

## 2020-09-29 LAB — PHOSPHORUS: Phosphorus: 3.3 mg/dL (ref 2.5–4.6)

## 2020-09-29 LAB — CORTISOL
Cortisol, Plasma: 24.8 ug/dL
Cortisol, Plasma: 30.4 ug/dL

## 2020-09-29 LAB — GLUCOSE, CAPILLARY
Glucose-Capillary: 95 mg/dL (ref 70–99)
Glucose-Capillary: 117 mg/dL — ABNORMAL HIGH (ref 70–99)
Glucose-Capillary: 117 mg/dL — ABNORMAL HIGH (ref 70–99)

## 2020-09-29 LAB — CREATININE, SERUM
Creatinine, Ser: 3.33 mg/dL — ABNORMAL HIGH (ref 0.61–1.24)
GFR, Estimated: 21 mL/min — ABNORMAL LOW (ref >60)

## 2020-09-29 LAB — MAGNESIUM
Magnesium: 2 mg/dL (ref 1.7–2.4)
Magnesium: 1.8 mg/dL (ref 1.7–2.4)

## 2020-09-29 LAB — APTT
aPTT: 52 seconds — ABNORMAL HIGH (ref 24–36)
aPTT: 73 seconds — ABNORMAL HIGH (ref 24–36)
aPTT: 50 seconds — ABNORMAL HIGH (ref 24–36)

## 2020-09-29 LAB — AMYLASE: Amylase: 36 U/L (ref 28–100)

## 2020-09-29 LAB — HIV ANTIBODY (ROUTINE TESTING W REFLEX): HIV Screen 4th Generation wRfx: NONREACTIVE

## 2020-09-29 LAB — PROCALCITONIN: Procalcitonin: 31.99 ng/mL

## 2020-09-29 MED ORDER — ORAL CARE MOUTH RINSE
15.0000 mL | Freq: Two times a day (BID) | OROMUCOSAL | Status: DC
  Administered 2020-09-29 – 2020-10-03 (×5): 15 mL via OROMUCOSAL

## 2020-09-29 MED ORDER — HEPARIN SODIUM (PORCINE) 5000 UNIT/ML IJ SOLN
5000.0000 [IU] | Freq: Three times a day (TID) | INTRAMUSCULAR | Status: DC
  Administered 2020-09-30: 5000 [IU] via SUBCUTANEOUS
  Filled 2020-09-29: qty 1

## 2020-09-29 MED ORDER — DEXTROSE 50 % IV SOLN
1.0000 | Freq: Once | INTRAVENOUS | Status: AC
  Administered 2020-09-29: 50 mL via INTRAVENOUS
  Filled 2020-09-29: qty 50

## 2020-09-29 MED ORDER — ALBUMIN HUMAN 5 % IV SOLN
INTRAVENOUS | Status: AC
  Administered 2020-09-29: 25 g via INTRAVENOUS
  Filled 2020-09-29: qty 500

## 2020-09-29 MED ORDER — POTASSIUM CHLORIDE 10 MEQ/100ML IV SOLN
10.0000 meq | Freq: Once | INTRAVENOUS | Status: AC
  Administered 2020-09-29: 10 meq via INTRAVENOUS

## 2020-09-29 MED ORDER — SODIUM BICARBONATE 8.4 % IV SOLN: 100.0000 meq | Freq: Once | INTRAVENOUS | Status: AC

## 2020-09-29 MED ORDER — SODIUM CHLORIDE 0.9 % IV SOLN: 250.0000 mL | INTRAVENOUS | Status: DC

## 2020-09-29 MED ORDER — PANTOPRAZOLE SODIUM 40 MG IV SOLR: 40.0000 mg | Freq: Every day | INTRAVENOUS | Status: DC

## 2020-09-29 MED ORDER — NOREPINEPHRINE 4 MG/250ML-% IV SOLN
0.0000 ug/min | INTRAVENOUS | Status: AC
  Administered 2020-09-29: 36 ug/min via INTRAVENOUS

## 2020-09-29 MED ORDER — CALCIUM GLUCONATE-NACL 1-0.675 GM/50ML-% IV SOLN: 1.0000 g | Freq: Once | INTRAVENOUS | Status: DC

## 2020-09-29 MED ORDER — MAGNESIUM SULFATE 2 GM/50ML IV SOLN
2.0000 g | Freq: Once | INTRAVENOUS | Status: AC
  Administered 2020-09-29: 2 g via INTRAVENOUS
  Filled 2020-09-29 (×2): qty 50

## 2020-09-29 MED ORDER — ONDANSETRON HCL 4 MG/2ML IJ SOLN: 4.0000 mg | Freq: Three times a day (TID) | INTRAMUSCULAR | Status: DC | PRN

## 2020-09-29 MED ORDER — VASOPRESSIN 20 UNITS/100 ML INFUSION FOR SHOCK
0.0000 [IU]/min | INTRAVENOUS | Status: DC
  Administered 2020-09-29 – 2020-09-30 (×2): 0.03 [IU]/min via INTRAVENOUS
  Filled 2020-09-29 (×2): qty 100

## 2020-09-29 MED ORDER — PIPERACILLIN-TAZOBACTAM 3.375 G IVPB 30 MIN
3.3750 g | Freq: Once | INTRAVENOUS | Status: AC
  Administered 2020-09-29: 3.375 g via INTRAVENOUS
  Filled 2020-09-29: qty 50

## 2020-09-29 MED ORDER — NOREPINEPHRINE 4 MG/250ML-% IV SOLN
2.0000 ug/min | INTRAVENOUS | Status: DC
  Filled 2020-09-29: qty 250

## 2020-09-29 MED ORDER — SODIUM CHLORIDE 0.9 % IV SOLN
1.0000 mg | Freq: Once | INTRAVENOUS | Status: AC
  Administered 2020-09-29: 1 mg via INTRAVENOUS
  Filled 2020-09-29: qty 0.2

## 2020-09-29 MED ORDER — ALBUMIN HUMAN 5 % IV SOLN: 25.0000 g | Freq: Once | INTRAVENOUS | Status: AC

## 2020-09-29 MED ORDER — POLYETHYLENE GLYCOL 3350 17 G PO PACK: 17.0000 g | PACK | Freq: Every day | ORAL | Status: DC | PRN

## 2020-09-29 MED ORDER — NOREPINEPHRINE 16 MG/250ML-% IV SOLN
0.0000 ug/min | INTRAVENOUS | Status: DC
  Administered 2020-09-29: 38 ug/min via INTRAVENOUS
  Administered 2020-09-30: 28 ug/min via INTRAVENOUS
  Administered 2020-09-30: 10 ug/min via INTRAVENOUS
  Administered 2020-10-01: 22 ug/min via INTRAVENOUS
  Administered 2020-10-01: 32 ug/min via INTRAVENOUS
  Administered 2020-10-02: 42 ug/min via INTRAVENOUS
  Administered 2020-10-02: 24 ug/min via INTRAVENOUS
  Administered 2020-10-03: 14 ug/min via INTRAVENOUS
  Administered 2020-10-04: 10 ug/min via INTRAVENOUS
  Administered 2020-10-05: 14 ug/min via INTRAVENOUS
  Administered 2020-10-06: 12 ug/min via INTRAVENOUS
  Administered 2020-10-07: 18 ug/min via INTRAVENOUS
  Filled 2020-09-29 (×9): qty 250
  Filled 2020-09-29: qty 500
  Filled 2020-09-29: qty 250

## 2020-09-29 MED ORDER — DOCUSATE SODIUM 100 MG PO CAPS: 100.0000 mg | ORAL_CAPSULE | Freq: Two times a day (BID) | ORAL | Status: DC | PRN

## 2020-09-29 MED ORDER — STERILE WATER FOR INJECTION IV SOLN
INTRAVENOUS | Status: DC
  Filled 2020-09-29 (×6): qty 1000

## 2020-09-29 MED ORDER — PIPERACILLIN-TAZOBACTAM 3.375 G IVPB
3.3750 g | Freq: Two times a day (BID) | INTRAVENOUS | Status: DC
  Administered 2020-09-30 (×2): 3.375 g via INTRAVENOUS
  Filled 2020-09-29 (×3): qty 50

## 2020-09-29 MED ORDER — CHLORHEXIDINE GLUCONATE CLOTH 2 % EX PADS
6.0000 | MEDICATED_PAD | Freq: Every day | CUTANEOUS | Status: DC
  Administered 2020-09-29 – 2020-10-07 (×9): 6 via TOPICAL

## 2020-09-29 MED ORDER — PIPERACILLIN-TAZOBACTAM 3.375 G IVPB
3.3750 g | Freq: Four times a day (QID) | INTRAVENOUS | Status: DC
Start: 2020-09-30 — End: 2020-09-29

## 2020-09-29 MED ORDER — ONDANSETRON HCL 4 MG/2ML IJ SOLN: 4.0000 mg | Freq: Four times a day (QID) | INTRAMUSCULAR | Status: DC | PRN

## 2020-09-29 MED ORDER — POTASSIUM CHLORIDE 10 MEQ/100ML IV SOLN
10.0000 meq | INTRAVENOUS | Status: AC
  Administered 2020-09-29 (×4): 10 meq via INTRAVENOUS
  Filled 2020-09-29 (×3): qty 100

## 2020-09-29 MED ORDER — SODIUM CHLORIDE 0.9 % IV BOLUS (SEPSIS)
1000.0000 mL | Freq: Once | INTRAVENOUS | Status: AC
  Administered 2020-09-29: 1000 mL via INTRAVENOUS

## 2020-09-29 MED ORDER — THIAMINE HCL 100 MG/ML IJ SOLN
100.0000 mg | Freq: Once | INTRAMUSCULAR | Status: AC
  Administered 2020-09-29: 100 mg via INTRAVENOUS
  Filled 2020-09-29: qty 2

## 2020-09-29 MED ORDER — SODIUM BICARBONATE 8.4 % IV SOLN
50.0000 meq | Freq: Once | INTRAVENOUS | Status: DC
  Filled 2020-09-29: qty 50

## 2020-09-29 MED ORDER — SODIUM BICARBONATE 8.4 % IV SOLN
INTRAVENOUS | Status: AC
  Administered 2020-09-29: 100 meq via INTRAVENOUS
  Filled 2020-09-29: qty 100

## 2020-09-29 MED ORDER — CALCIUM GLUCONATE-NACL 2-0.675 GM/100ML-% IV SOLN
2.0000 g | Freq: Once | INTRAVENOUS | Status: AC
  Administered 2020-09-29: 2000 mg via INTRAVENOUS
  Filled 2020-09-29: qty 100

## 2020-09-29 MED ORDER — PANTOPRAZOLE SODIUM 40 MG IV SOLR
40.0000 mg | Freq: Two times a day (BID) | INTRAVENOUS | Status: DC
  Administered 2020-09-29 – 2020-10-03 (×9): 40 mg via INTRAVENOUS
  Filled 2020-09-29 (×9): qty 40

## 2020-09-29 MED ORDER — SODIUM CHLORIDE 0.9 % IV BOLUS
1000.0000 mL | Freq: Once | INTRAVENOUS | Status: AC
  Administered 2020-09-29: 1000 mL via INTRAVENOUS

## 2020-09-29 MED ORDER — NOREPINEPHRINE 4 MG/250ML-% IV SOLN
0.0000 ug/min | INTRAVENOUS | Status: DC
  Administered 2020-09-29: 2 ug/min via INTRAVENOUS
  Filled 2020-09-29: qty 250

## 2020-09-29 MED ORDER — THIAMINE HCL 100 MG/ML IJ SOLN
100.0000 mg | Freq: Every day | INTRAMUSCULAR | Status: DC
  Administered 2020-09-29 – 2020-09-30 (×2): 100 mg via INTRAVENOUS
  Filled 2020-09-29 (×2): qty 2

## 2020-09-29 MED ORDER — SODIUM CHLORIDE 0.9 % IV SOLN: INTRAVENOUS | Status: DC

## 2020-09-29 MED ORDER — COSYNTROPIN 0.25 MG IJ SOLR: 0.2500 mg | Freq: Once | INTRAMUSCULAR | Status: DC

## 2020-09-29 NOTE — Progress Notes (Signed)
eLink Physician-Brief Progress Note Patient Name: Elyan Vanwieren DOB: 1960-11-14 MRN: [id number]   Date of Service  09/25/2020  HPI/Events of Note  Patient admitted with altered mental status, nausea and vomiting of coffee grounds material, hypotension with slight elevation of lactic acid, severe hyponatremia / hypokalemia, AKI, pneumonia r/o aspiration, against a background of heavy ETOH abuse and COPD. Patient also had a fall recently but CT brain and spine are without acute findings. Daughter states that patient wants to be DNR but appears a bit confused about what it means exactly. Patient is on > 20 mcg of Levophed via a peripheral line.  eICU Interventions  Albumin 5 % 500 ml iv bolus x 1 (serum albumin is 1.2 gm / dl ), PCCM ground crew requested to come and place a central line, and clarify code status via face to face conversation with daughter. Patient is quite obtunded.        Kerry Kass Juwon Scripter 09/22/2020, 8:11 PM

## 2020-09-29 NOTE — Progress Notes (Signed)
Date and time results received: 10/06/2020 1955 (use smartphrase ".now" to insert current time)  Test: Lactic Critical Value: 2.0  Name of Provider Notified: elink  Orders Received? Or Actions Taken?: awaiting new orders

## 2020-09-29 NOTE — ED Triage Notes (Signed)
Pt arrives vi [facility] for AMS.  Pt hd a fall a week ago where he hit his head and was not evaluated.  Since then he has very little intake and has become progressively weaker.  Pt has hx of ETOH but family states he hasnt drunk anything in about a week.   Pt received a L bolus of NS, 150 m L of D10 and 10 mcg of EPI IVP en route.   Pt has a colostomy.

## 2020-09-29 NOTE — Progress Notes (Signed)
[facility] Progress Note Patient Name: Hanif Radin DOB: September 15, 1960 MRN: [id number]   Date of Service  10/13/2020  HPI/Events of Note  K+ < 2, corrected calcium 7.3 gm / dl.  eICU Interventions  K+ replacement in progress, Calcium gluconate 2 gm iv bolus ordered.        Kerry Kass Casee Knepp 09/30/2020, 11:04 PM

## 2020-09-29 NOTE — Procedures (Signed)
Central Venous Catheter Insertion Procedure Note  Dominic Phillips  [id number]  January 02, 1961  Date:09/20/2020  Time:9:23 PM   Provider Performing:Eleshia Wooley [name]   Procedure: Insertion of Non-tunneled Central Venous [phone]) with US guidance ([id number])   Indication(s) Medication administration  Consent Risks of the procedure as well as the alternatives and risks of each were explained to the patient and/or caregiver.  Consent for the procedure was obtained and is signed in the bedside chart  Anesthesia Topical only with 1% lidocaine   Timeout Verified patient identification, verified procedure, site/side was marked, verified correct patient position, special equipment/implants available, medications/allergies/relevant history reviewed, required imaging and test results available.  Sterile Technique Maximal sterile technique including full sterile barrier drape, hand hygiene, sterile gown, sterile gloves, mask, hair covering, sterile ultrasound probe cover (if used).  Procedure Description Area of catheter insertion was cleaned with chlorhexidine and draped in sterile fashion.  With real-time ultrasound guidance a central venous catheter was placed into the left internal jugular vein. Nonpulsatile blood flow and easy flushing noted in all ports.  The catheter was sutured in place and sterile dressing applied.  Complications/Tolerance None; patient tolerated the procedure well. Chest X-ray is ordered to verify placement for internal jugular or subclavian cannulation.   Chest x-ray is not ordered for femoral cannulation.  EBL Minimal  Specimen(s) None    Line was done under u/s guidance. Easily compressible vein noted with neighboring pulsatile artery. Upon stick dark red non pulsatile blood was noted. Wire easily advanced. Wire was verified to be in easily compressible/non pulsatile vessel in both cross sectional and longitudinal views under ultrasound guidance. Skin was easily  dilated and catheter advanced without issue. Wire was withdrawn from vessel. No air was aspirated thru entirety of the procedure. Pt tolerated well. No complications were appreciated.

## 2020-09-29 NOTE — ED Notes (Signed)
VBG and Chem8 redrawn for lab verification. Dr.Rees made aware of critical lab results. ED-Lab

## 2020-09-29 NOTE — ED Provider Notes (Signed)
[facility] EMERGENCY DEPARTMENT Provider Note   CSN: [id number] Arrival date & time: 10/11/2020  1342     History No chief complaint on file.   Dominic Phillips is a 60 y.o. male.  The history is provided by the patient, a relative and medical records.   Dominic Phillips is a 60 y.o. male who presents to the Emergency Department complaining of weakness. Level V caveat due to AMS.  Hx is provided by EMS and the granddaughter. EMS reports a glucose in the 30s. They started D10 and repeat blood glucose was in the 50s.  Fell 1.5 weeks ago.  Hit his head.  1-2 days later he began to complaining of feeling poorly.  Started vomiting.  Initially emesis looked like food, then looked like coffee grounds.  Dizziness with ambulation.  He has a hx/o EtOH abuse - has not drink in a week, normally drinks from sunup to sundown.    Takes tylenol and ibuprofen daily but has only been taking tylenol since Wednesday.  Sometimes takes more than what is listed on the label.      No past medical history on file.  Patient Active Problem List   Diagnosis Date Noted  . Shock ([place]) 09/27/2020  . Complicated UTI (urinary tract infection) 06/24/2017  . Hypokalemia 06/22/2017  . Routine adult health maintenance   . History of ETT   . Pleural effusion   . Encounter for imaging study to confirm orogastric (OG) tube placement   . Endotracheally intubated   . Acute respiratory failure with hypoxia ([place])   . Palliative care by specialist   . Dyspnea   . HCAP (healthcare-associated pneumonia)   . Acute blood loss anemia   . Anemia of chronic disease   . ETOH abuse   . Tobacco abuse   . Tachycardia   . Tachypnea   . Leukocytosis   . Post-operative pain   . Pressure injury of skin 04/19/2017  . Colonic diverticular abscess 04/19/2017  . COPD (chronic obstructive pulmonary disease) ([place]) 04/19/2017  . Acute kidney injury ([place])   . Acute respiratory distress syndrome (ARDS) ([place]) 04/11/2017     Past Surgical History:  Procedure Laterality Date  . COLECTOMY WITH COLOSTOMY CREATION/HARTMANN PROCEDURE N/A 05/13/2017   Procedure: COLECTOMY WITH COLOSTOMY CREATION/HARTMANN PROCEDURE;  Surgeon: Rolm Bookbinder, MD;  Location: [facility];  Service: General;  Laterality: N/A;  . COLONOSCOPY WITH PROPOFOL N/A 01/07/2018   Procedure: COLONOSCOPY WITH PROPOFOL  ELEVIEW SPOT;  Surgeon: Ileana Roup, MD;  Location: [facility];  Service: General;  Laterality: N/A;  [place] Lifting SPOT injection  . IR PARACENTESIS  05/05/2017  . IR THORACENTESIS ASP PLEURAL SPACE W/IMG GUIDE  04/23/2017  . POLYPECTOMY  01/07/2018   Procedure: POLYPECTOMY;  Surgeon: Ileana Roup, MD;  Location: [facility];  Service: General;;  cold bx, hot snare       No family history on file.  Social History   Tobacco Use  . Smoking status: Current Every Day Smoker    Packs/day: 3.00    Types: Cigarettes  . Smokeless tobacco: Never Used  Vaping Use  . Vaping Use: Unknown  Substance Use Topics  . Alcohol use: Yes    Alcohol/week: 18.0 standard drinks    Types: 18 Cans of beer per week  . Drug use: No    Home Medications Prior to Admission medications   Medication Sig Start Date End Date Taking? Authorizing Provider  acetaminophen (TYLENOL) 500 MG tablet Take 2,000 mg  by mouth 5 (five) times daily as needed for headache (pain).   Yes [provider]  ibuprofen (ADVIL) 200 MG tablet Take 400 mg by mouth 5 (five) times daily as needed for headache (pain).   Yes [provider]    Allergies    Patient has no known allergies.  Review of Systems   Review of Systems  Unable to perform ROS: Mental status change    Physical Exam Updated Vital Signs BP (!) 89/50 (BP Location: Left Arm)   Pulse 82   Temp (!) 97.5 F (36.4 C) (Oral)   Resp 13   SpO2 92%   Physical Exam Vitals and nursing note reviewed.  Constitutional:      General: He is in acute distress.      Appearance: He is well-developed. He is ill-appearing.     Comments: Drowsy but awakens to verbal stimuli  HENT:     Head: Normocephalic and atraumatic.     Mouth/Throat:     Mouth: Mucous membranes are dry.  Cardiovascular:     Rate and Rhythm: Normal rate and regular rhythm.     Heart sounds: No murmur heard.   Pulmonary:     Effort: Pulmonary effort is normal. No respiratory distress.     Breath sounds: Normal breath sounds.  Abdominal:     Palpations: Abdomen is soft.     Tenderness: There is no abdominal tenderness. There is no guarding or rebound.     Comments: Ostomy pouch in LLQ  Musculoskeletal:        General: No swelling or tenderness.  Skin:    General: Skin is warm and dry.     Capillary Refill: Capillary refill takes more than 3 seconds.  Neurological:     Comments: Oriented to person.  Generalized weakness  Psychiatric:     Comments: Unable to assess     ED Results / Procedures / Treatments   Labs (all labs ordered are listed, but only abnormal results are displayed) Labs Reviewed  LACTIC ACID, PLASMA - Abnormal; Notable for the following components:      Result Value   Lactic Acid, Venous 2.0 (*)    All other components within normal limits  COMPREHENSIVE METABOLIC PANEL - Abnormal; Notable for the following components:   Sodium 117 (*)    Potassium <2.0 (*)    Chloride 83 (*)    CO2 7 (*)    Glucose, Bld 106 (*)    BUN 53 (*)    Creatinine, Ser 4.24 (*)    Calcium 5.9 (*)    Total Protein 3.8 (*)    Albumin 1.2 (*)    AST 61 (*)    Alkaline Phosphatase 143 (*)    Total Bilirubin 1.3 (*)    GFR, Estimated 16 (*)    Anion gap 27 (*)    All other components within normal limits  CBC WITH DIFFERENTIAL/PLATELET - Abnormal; Notable for the following components:   WBC 16.9 (*)    Hemoglobin 9.0 (*)    Platelets 149 (*)    nRBC 0.4 (*)    Neutro Abs 14.0 (*)    Abs Immature Granulocytes 1.56 (*)    All other components within normal limits   PROTIME-INR - Abnormal; Notable for the following components:   Prothrombin Time 23.8 (*)    INR 2.1 (*)    All other components within normal limits  APTT - Abnormal; Notable for the following components:   aPTT 52 (*)  All other components within normal limits  URINALYSIS, ROUTINE W REFLEX MICROSCOPIC - Abnormal; Notable for the following components:   Color, Urine BROWN (*)    APPearance TURBID (*)    Hgb urine dipstick LARGE (*)    Ketones, ur 5 (*)    Protein, ur >=300 (*)    Leukocytes,Ua MODERATE (*)    RBC / HPF >50 (*)    WBC, UA >50 (*)    Bacteria, UA MANY (*)    All other components within normal limits  CREATININE, SERUM - Abnormal; Notable for the following components:   Creatinine, Ser 3.33 (*)    GFR, Estimated 21 (*)    All other components within normal limits  LIPASE, BLOOD - Abnormal; Notable for the following components:   Lipase 63 (*)    All other components within normal limits  CBG MONITORING, ED - Abnormal; Notable for the following components:   Glucose-Capillary 54 (*)    All other components within normal limits  I-STAT VENOUS BLOOD GAS, ED - Abnormal; Notable for the following components:   pH, Ven 7.096 (*)    pCO2, Ven 24.0 (*)    pO2, Ven 51.0 (*)    Bicarbonate 7.4 (*)    TCO2 8 (*)    Acid-base deficit 21.0 (*)    Sodium 118 (*)    Potassium <2.0 (*)    Calcium, Ion 0.83 (*)    HCT 27.0 (*)    Hemoglobin 9.2 (*)    All other components within normal limits  I-STAT VENOUS BLOOD GAS, ED - Abnormal; Notable for the following components:   pH, Ven 7.074 (*)    pCO2, Ven 25.0 (*)    pO2, Ven 74.0 (*)    Bicarbonate 7.3 (*)    TCO2 8 (*)    Acid-base deficit 21.0 (*)    Sodium 118 (*)    Potassium <2.0 (*)    Calcium, Ion 0.84 (*)    HCT 28.0 (*)    Hemoglobin 9.5 (*)    All other components within normal limits  I-STAT VENOUS BLOOD GAS, ED - Abnormal; Notable for the following components:   pH, Ven 7.170 (*)    pCO2, Ven 16.1  (*)    pO2, Ven 149.0 (*)    Bicarbonate 5.9 (*)    TCO2 6 (*)    Acid-base deficit 21.0 (*)    Sodium 116 (*)    Potassium <2.0 (*)    Calcium, Ion 0.72 (*)    HCT 28.0 (*)    Hemoglobin 9.5 (*)    All other components within normal limits  CBG MONITORING, ED - Abnormal; Notable for the following components:   Glucose-Capillary 128 (*)    All other components within normal limits  RESP PANEL BY RT-PCR (FLU A&B, COVID) [id number]  CULTURE, BLOOD (SINGLE)  URINE CULTURE  CULTURE, BLOOD (ROUTINE X 2)  CULTURE, BLOOD (ROUTINE X 2)  URINE CULTURE  STOOL CULTURE  LACTIC ACID, PLASMA  MAGNESIUM  MAGNESIUM  PHOSPHORUS  PROCALCITONIN  AMYLASE  GLUCOSE, CAPILLARY  COMPREHENSIVE METABOLIC PANEL  HIV ANTIBODY (ROUTINE TESTING W REFLEX)  CORTISOL  STREP PNEUMONIAE URINARY ANTIGEN  LACTOFERRIN, FECAL,QUALITATIVE  CBC  BASIC METABOLIC PANEL  BLOOD GAS, ARTERIAL  CBC WITH DIFFERENTIAL/PLATELET  PROTIME-INR  APTT  I-STAT CHEM 8, ED  CBG MONITORING, ED    EKG None  Radiology CT Head Wo Contrast  Result Date: 09/28/2020 CLINICAL DATA:  Recent fall with headaches and neck pain, initial encounter EXAM:  CT HEAD WITHOUT CONTRAST CT CERVICAL SPINE WITHOUT CONTRAST TECHNIQUE: Multidetector CT imaging of the head and cervical spine was performed following the standard protocol without intravenous contrast. Multiplanar CT image reconstructions of the cervical spine were also generated. COMPARISON:  04/10/2017 FINDINGS: CT HEAD FINDINGS Brain: No evidence of acute infarction, hemorrhage, hydrocephalus, extra-axial collection or mass lesion/mass effect. Vascular: No hyperdense vessel or unexpected calcification. Skull: Normal. Negative for fracture or focal lesion. Sinuses/Orbits: No acute finding. Other: None. CT CERVICAL SPINE FINDINGS Alignment: Within normal limits. Skull base and vertebrae: 7 cervical segments are well visualized. Vertebral body height is well maintained. Mild facet  hypertrophic changes are noted. No acute fracture or acute facet abnormality is noted. Soft tissues and spinal canal: Surrounding soft tissue structures are within normal limits. No hematoma is seen. Upper chest: Visualized lung apices are within normal limits. Other: None IMPRESSION: CT of the head: No acute intracranial abnormality noted. CT of the cervical spine: Multilevel degenerative change in the facet joints without acute abnormality. Electronically Signed   By: Inez Catalina M.D.   On: 09/15/2020 16:47   CT Cervical Spine Wo Contrast  Result Date: 09/19/2020 CLINICAL DATA:  Recent fall with headaches and neck pain, initial encounter EXAM: CT HEAD WITHOUT CONTRAST CT CERVICAL SPINE WITHOUT CONTRAST TECHNIQUE: Multidetector CT imaging of the head and cervical spine was performed following the standard protocol without intravenous contrast. Multiplanar CT image reconstructions of the cervical spine were also generated. COMPARISON:  04/10/2017 FINDINGS: CT HEAD FINDINGS Brain: No evidence of acute infarction, hemorrhage, hydrocephalus, extra-axial collection or mass lesion/mass effect. Vascular: No hyperdense vessel or unexpected calcification. Skull: Normal. Negative for fracture or focal lesion. Sinuses/Orbits: No acute finding. Other: None. CT CERVICAL SPINE FINDINGS Alignment: Within normal limits. Skull base and vertebrae: 7 cervical segments are well visualized. Vertebral body height is well maintained. Mild facet hypertrophic changes are noted. No acute fracture or acute facet abnormality is noted. Soft tissues and spinal canal: Surrounding soft tissue structures are within normal limits. No hematoma is seen. Upper chest: Visualized lung apices are within normal limits. Other: None IMPRESSION: CT of the head: No acute intracranial abnormality noted. CT of the cervical spine: Multilevel degenerative change in the facet joints without acute abnormality. Electronically Signed   By: Inez Catalina M.D.    On: 10/05/2020 16:47   DG Chest Port 1 View  Result Date: 09/20/2020 CLINICAL DATA:  Question sepsis EXAM: PORTABLE CHEST 1 VIEW COMPARISON:  December 31, 2017 FINDINGS: The heart size and mediastinal contours are within normal limits. Hazy ground-glass opacity is identified in the right lateral mid lung. The left lung is clear. There is a calcified granuloma in the left lung base. There is no pleural effusion or pulmonary edema. The visualized skeletal structures are unremarkable. IMPRESSION: Hazy ground-glass opacity identified in the right lateral mid lung, developing pneumonia is not excluded. Electronically Signed   By: Abelardo Diesel M.D.   On: 09/26/2020 14:31    Procedures Procedures  CRITICAL CARE Performed by: Quintella Reichert   Total critical care time: 60 minutes  Critical care time was exclusive of separately billable procedures and treating other patients.  Critical care was necessary to treat or prevent imminent or life-threatening deterioration.  Critical care was time spent personally by me on the following activities: development of treatment plan with patient and/or surrogate as well as nursing, discussions with consultants, evaluation of patient's response to treatment, examination of patient, obtaining history from patient or surrogate, ordering and performing  treatments and interventions, ordering and review of laboratory studies, ordering and review of radiographic studies, pulse oximetry and re-evaluation of patient's condition.  Medications Ordered in ED Medications  potassium chloride 10 mEq in 100 mL IVPB (10 mEq Intravenous New Bag/Given 10/04/2020 2010)  docusate sodium (COLACE) capsule 100 mg (has no administration in time range)  polyethylene glycol (MIRALAX / GLYCOLAX) packet 17 g (has no administration in time range)  heparin injection 5,000 Units (has no administration in time range)  0.9 %  sodium chloride infusion ( Intravenous New Bag/Given 09/21/2020 1620)  0.9 %   sodium chloride infusion (0 mLs Intravenous Stopped 09/20/2020 1640)  norepinephrine (LEVOPHED) 4mg  in 263mL premix infusion (10 mcg/min Intravenous IV [employer] 09/24/2020 1758)  thiamine (B-1) injection 100 mg (has no administration in time range)  pantoprazole (PROTONIX) injection 40 mg (has no administration in time range)  ondansetron (ZOFRAN) injection 4 mg (has no administration in time range)  sodium chloride 0.9 % bolus 1,000 mL (0 mLs Intravenous Stopped 09/30/2020 1518)  thiamine (B-1) injection 100 mg (100 mg Intravenous Given 09/24/2020 1407)  dextrose 50 % solution 50 mL (50 mLs Intravenous Given 09/24/2020 1407)  sodium chloride 0.9 % bolus 1,000 mL (0 mLs Intravenous Stopped 10/03/2020 1646)  piperacillin-tazobactam (ZOSYN) IVPB 3.375 g (0 g Intravenous Stopped 09/25/2020 1615)  sodium chloride 0.9 % bolus 1,000 mL (1,000 mLs Intravenous New Bag/Given 10/06/2020 1635)  magnesium sulfate IVPB 2 g 50 mL (2 g Intravenous New Bag/Given 9/62/22 9798)  folic acid 1 mg in sodium chloride 0.9 % 50 mL IVPB (1 mg Intravenous New Bag/Given 10/06/2020 1758)  albumin human 5 % solution 25 g (25 g Intravenous New Bag/Given 10/02/2020 2006)    ED Course  I have reviewed the triage vital signs and the nursing notes.  Pertinent labs & imaging results that were available during my care of the patient were reviewed by me and considered in my medical decision making (see chart for details).    MDM Rules/Calculators/A&P                         patient with history of alcohol abuse here for evaluation of altered mental status in the setting of poor appetite and frequent vomiting in the last week. He is ill appearing on evaluation. He appears significantly dehydrated, has confusion. He was hypoglycemic and was treated with D50, thiamine for his history of alcohol abuse. Labs with significant electrolyte abnormalities including hyponatremia, hypokalemia with acute renal failure. He was treated with empiric antibiotics for  possible sepsis. He was treated with IV fluid hydration. Electrolyte replacement initiated. Critical care consulted for admission for ongoing treatment and resuscitation. Discussed with patient and granddaughter critical nature of illness.  Final Clinical Impression(s) / ED Diagnoses Final diagnoses:  Shock ([place])  Metabolic acidosis  Hyponatremia  Hypokalemia    Rx / DC Orders ED Discharge Orders    None       Quintella Reichert, MD 10/01/2020 2016

## 2020-09-29 NOTE — Progress Notes (Addendum)
Cross covering ICU physician.   Called to bedside by elink dr for evaluation of pt. Reportedly obtunded on escalating pressors to 36mcg via piv.   Pt is evaluated and awakens, he is clinically dry currently getting albumin after ivf bolus. Noted to have dark emesis and blood in urine as well. Protecting airway.   Daughter at bedside and updated. Did determine he is partial code. He would be amendable to short term intubation but not long term. He would not want cpr but aggressive goals to that point.   -stat labs to follow -cvc will be placed -monitor closely for airway compromise -holding heparin this evening   Critical care time: The patient is critically ill with multiple organ systems failure and requires high complexity decision making for assessment and support, frequent evaluation and titration of therapies, application of advanced monitoring technologies and extensive interpretation of multiple databases.  Critical care time 35 mins. This represents my time independent of the NPs time taking care of the pt. This is excluding procedures.    Audria Nine DO  Pulmonary and Critical Care 09/30/2020, 8:55 PM See Amion for pager If no response to pager, please call [phone] until 1900 After 1900 please call [facility] [phone]

## 2020-09-29 NOTE — Progress Notes (Signed)
Multiple messages sent from day RN-Laura Caudle to Dr. Tacy Learn regarding levophed dose required by patient with no improvement in blood pressure. Patient unarousable. Per Corinda Gubler, RN patient Vomited 200 ml coffee ground emesis (observed in canister in room) with blood present in urine. Patient on 2L O2, mouth breathing, eyes open but only responsive to deep sternal rubs upon assessment.  No orders received. No calls received back.  [place] notified. Charge RN [name] also at bedside. Dr. Lucile Shutters on camera- verbal order given to titrate Levophed dosing to maintain life and Albumin ordered.  Dr. [name] at bedside has given verbal orders for peripheral dose levophed to be titrated as needed to maintain life and NS bolus 500 ml ordered by Dr. [name] and administered by RN.   Daughter at bedside consented to central line placement and code status discussions done by Dr. [name]. Daughter in agreement with plans.

## 2020-09-29 NOTE — ED Notes (Signed)
Report attempted.  Unit will return call.

## 2020-09-29 NOTE — H&P (Signed)
NAME:  Dominic Phillips, MRN:  [id number], DOB:  1961/03/30, LOS: 0 ADMISSION DATE:  09/14/2020, CONSULTATION DATE: 10/04/2020 REFERRING MD:  Quintella Reichert, CHIEF COMPLAINT: Altered mental status  History of Present Illness:  Patient is encephalopathic so most of the history is taken from chart review  60 year old male who was brought into the emergency department with a generalized weakness and altered mental status.  Per chart review patient had a fall about 10 days ago where he hit his head but did not get evaluated since then he has not been eating well, started with nausea and vomiting and started with generalized weakness and feeling confused. Today he was feeling dizzy with ambulation so he was sent to the emergency department. Of note patient has history of alcohol abuse but he could not drink due to nausea and vomiting for about a week  In the emergency department was noted to be hypotensive despite IV fluid resuscitation and with severe electrolyte imbalance, critical care was consulted for evaluation and admission to ICU CT head and cervical spine is pending Pertinent  Medical History  COPD Status post colectomy with endcolostomy Alcohol abuse Tobacco dependence  Significant Hospital Events: Including procedures, antibiotic start and stop dates in addition to other pertinent events   . 4/16 admitted  Interim History / Subjective:    Objective   Blood pressure (!) 79/53, pulse 76, resp. rate 15, SpO2 100 %.        Intake/Output Summary (Last 24 hours) at 09/14/2020 1539 Last data filed at 10/13/2020 1521 Gross per 24 hour  Intake 2160.84 ml  Output --  Net 2160.84 ml   There were no vitals filed for this visit.  Examination:   Physical exam: General: Chronically ill-appearing male, lying on the bed HEENT: [place]/AT, eyes anicteric.  Severely dry mucous membranes Neuro: Awake but confused and disoriented, not following commands.  Moving all 4 extremities  spontaneously Chest: Coarse breath sounds, no wheezes or rhonchi Heart: Regular rate and rhythm, no murmurs or gallops Abdomen: Soft, nontender, nondistended, bowel sounds present.  Colostomy in place with greenish watery stool in the bag Skin: No rash   Labs/imaging that I havepersonally reviewed  (right click and "Reselect all SmartList Selections" daily)  VBG: 7.116/149 Serum sodium 117, serum potassium <2, serum creatinine 4.2 with anion gap of 27 CT head/neck: Pending   Resolved Hospital Problem list     Assessment & Plan:  Acute gastroenteritis infectious versus inflammatory Hyponatremia Severe hypokalemia Acute kidney injury due to severe dehydration High anion gap metabolic acidosis due to frequent nausea and vomiting Probable aspiration pneumonia Status post fall with head injury, rule out intracranial bleed Acute metabolic encephalopathy Alcohol abuse Tobacco dependence  She presented with nausea and vomiting for about 1 week, clinically he looks severely dehydrated and his lab work is consistent with severe electrolyte imbalance and AKI Send stool for WBCs and culture Closely monitor serum sodium every 6 hours Supplement electrolytes Continue aggressive IV fluid resuscitation Repeat BMP after electrolyte replacement Patient x-ray chest is suggestive of right lower lobe/middle lobe pneumonia could be due to aspiration from nausea and vomiting We will continue with IV Zosyn Get respiratory culture Patient had a fall about a week ago, he is very confused could be due to metabolic cause versus head injury Head CT and cervical CT spine is pending Avoid sedation Continue high-dose thiamine and folate Continue nicotine patch to prevent nicotine withdrawal  Best practice (right click and "Reselect all SmartList Selections" daily)  Diet:  NPO Pain/Anxiety/Delirium protocol (if indicated): No VAP protocol (if indicated): Not indicated DVT prophylaxis: Subcutaneous  Heparin GI prophylaxis: N/A Glucose control:  SSI No Central venous access:  N/A Arterial line:  N/A Foley:  Yes, and it is still needed Mobility:  bed rest  PT consulted: N/A Last date of multidisciplinary goals of care discussion [pending] Code Status:  full code Disposition: ICU  Labs   CBC: Recent Labs  Lab 10/10/2020 1408 09/20/2020 1416 09/26/2020 1438  HGB 9.2* 9.5* 9.5*  HCT 27.0* 28.0* 28.0*    Basic Metabolic Panel: Recent Labs  Lab 09/30/2020 1408 10/11/2020 1416 10/06/2020 1438  NA 118* 118* 116*  K <2.0* <2.0* <2.0*   GFR: CrCl cannot be calculated (Patient's most recent lab result is older than the maximum 21 days allowed.). Recent Labs  Lab 09/28/2020 1400  LATICACIDVEN 1.5    Liver Function Tests: No results for input(s): AST, ALT, ALKPHOS, BILITOT, PROT, ALBUMIN in the last 168 hours. No results for input(s): LIPASE, AMYLASE in the last 168 hours. No results for input(s): AMMONIA in the last 168 hours.  ABG    Component Value Date/Time   PHART 7.368 04/26/2017 0940   PCO2ART 42.5 04/26/2017 0940   PO2ART 114 (H) 04/26/2017 0940   HCO3 5.9 (L) 09/27/2020 1438   TCO2 6 (L) 10/11/2020 1438   ACIDBASEDEF 21.0 (H) 09/20/2020 1438   O2SAT 99.0 09/16/2020 1438     Coagulation Profile: Recent Labs  Lab 09/26/2020 1400  INR 2.1*    Cardiac Enzymes: No results for input(s): CKTOTAL, CKMB, CKMBINDEX, TROPONINI in the last 168 hours.  HbA1C: No results found for: HGBA1C  CBG: Recent Labs  Lab 09/18/2020 1349 10/13/2020 1446 10/11/2020 1512  GLUCAP 54* 89 128*    Review of Systems:   Unable to obtain as patient is encephalopathic  Past Medical History:  He,  has no past medical history on file.   Surgical History:   Past Surgical History:  Procedure Laterality Date  . COLECTOMY WITH COLOSTOMY CREATION/HARTMANN PROCEDURE N/A 05/13/2017   Procedure: COLECTOMY WITH COLOSTOMY CREATION/HARTMANN PROCEDURE;  Surgeon: Rolm Bookbinder, MD;  Location:  [facility];  Service: General;  Laterality: N/A;  . COLONOSCOPY WITH PROPOFOL N/A 01/07/2018   Procedure: COLONOSCOPY WITH PROPOFOL  ELEVIEW SPOT;  Surgeon: Ileana Roup, MD;  Location: [facility];  Service: General;  Laterality: N/A;  [place] Lifting SPOT injection  . IR PARACENTESIS  05/05/2017  . IR THORACENTESIS ASP PLEURAL SPACE W/IMG GUIDE  04/23/2017  . POLYPECTOMY  01/07/2018   Procedure: POLYPECTOMY;  Surgeon: Ileana Roup, MD;  Location: [facility];  Service: General;;  cold bx, hot snare     Social History:   reports that he has been smoking cigarettes. He has been smoking about 3.00 packs per day. He has never used smokeless tobacco. He reports current alcohol use of about 18.0 standard drinks of alcohol per week. He reports that he does not use drugs.   Family History:  His family history is not on file.   Allergies No Known Allergies   Home Medications  Prior to Admission medications   Medication Sig Start Date End Date Taking? Authorizing Provider  acetaminophen (TYLENOL) 325 MG tablet Take 2 tablets (650 mg total) by mouth every 6 (six) hours. 05/19/17   Domenic Polite, MD  feeding supplement, ENSURE ENLIVE, (ENSURE ENLIVE) LIQD Take 237 mLs by mouth 2 (two) times daily between meals. Patient not taking: Reported on 12/07/2017 06/24/17   Mariel Aloe,  MD     Total critical care time: 52 minutes  Performed by: [place] care time was exclusive of separately billable procedures and treating other patients.   Critical care was necessary to treat or prevent imminent or life-threatening deterioration.   Critical care was time spent personally by me on the following activities: development of treatment plan with patient and/or surrogate as well as nursing, discussions with consultants, evaluation of patient's response to treatment, examination of patient, obtaining history from patient or surrogate, ordering and performing treatments and  interventions, ordering and review of laboratory studies, ordering and review of radiographic studies, pulse oximetry and re-evaluation of patient's condition.   Jacky Kindle MD [place] [facility] See Amion for pager If no response to pager, please call [phone] until 7pm After 7pm, Please call E-link [phone]

## 2020-09-30 ENCOUNTER — Inpatient Hospital Stay (HOSPITAL_COMMUNITY): Payer: Medicaid Other

## 2020-09-30 DIAGNOSIS — J9601 Acute respiratory failure with hypoxia: Secondary | ICD-10-CM | POA: Diagnosis not present

## 2020-09-30 DIAGNOSIS — I361 Nonrheumatic tricuspid (valve) insufficiency: Secondary | ICD-10-CM | POA: Diagnosis not present

## 2020-09-30 DIAGNOSIS — E872 Acidosis: Secondary | ICD-10-CM

## 2020-09-30 DIAGNOSIS — R579 Shock, unspecified: Secondary | ICD-10-CM | POA: Diagnosis not present

## 2020-09-30 DIAGNOSIS — R9431 Abnormal electrocardiogram [ECG] [EKG]: Secondary | ICD-10-CM | POA: Diagnosis not present

## 2020-09-30 DIAGNOSIS — E876 Hypokalemia: Secondary | ICD-10-CM | POA: Diagnosis not present

## 2020-09-30 DIAGNOSIS — E871 Hypo-osmolality and hyponatremia: Secondary | ICD-10-CM | POA: Diagnosis not present

## 2020-09-30 LAB — CBC WITH DIFFERENTIAL/PLATELET
Abs Immature Granulocytes: 1.02 10*3/uL — ABNORMAL HIGH (ref 0.00–0.07)
Basophils Absolute: 0.1 10*3/uL (ref 0.0–0.1)
Basophils Relative: 0 %
Eosinophils Absolute: 0 10*3/uL (ref 0.0–0.5)
Eosinophils Relative: 0 %
Hemoglobin: 7.6 g/dL — ABNORMAL LOW (ref 13.0–17.0)
Immature Granulocytes: 6 %
Lymphocytes Relative: 3 %
Lymphs Abs: 0.5 10*3/uL — ABNORMAL LOW (ref 0.7–4.0)
Monocytes Absolute: 0.3 10*3/uL (ref 0.1–1.0)
Monocytes Relative: 2 %
Neutro Abs: 15.6 10*3/uL — ABNORMAL HIGH (ref 1.7–7.7)
Neutrophils Relative %: 89 %
Platelets: 52 10*3/uL — ABNORMAL LOW (ref 150–400)
WBC: 17.5 10*3/uL — ABNORMAL HIGH (ref 4.0–10.5)

## 2020-09-30 LAB — POCT I-STAT 7, (LYTES, BLD GAS, ICA,H+H)
Acid-base deficit: 15 mmol/L — ABNORMAL HIGH (ref 0.0–2.0)
Acid-base deficit: 15 mmol/L — ABNORMAL HIGH (ref 0.0–2.0)
Acid-base deficit: 11 mmol/L — ABNORMAL HIGH (ref 0.0–2.0)
Acid-base deficit: 10 mmol/L — ABNORMAL HIGH (ref 0.0–2.0)
Bicarbonate: 11.2 mmol/L — ABNORMAL LOW (ref 20.0–28.0)
Bicarbonate: 11.7 mmol/L — ABNORMAL LOW (ref 20.0–28.0)
Bicarbonate: 13.9 mmol/L — ABNORMAL LOW (ref 20.0–28.0)
Bicarbonate: 17.2 mmol/L — ABNORMAL LOW (ref 20.0–28.0)
Calcium, Ion: 1.02 mmol/L — ABNORMAL LOW (ref 1.15–1.40)
Calcium, Ion: 1.12 mmol/L — ABNORMAL LOW (ref 1.15–1.40)
Calcium, Ion: 1.03 mmol/L — ABNORMAL LOW (ref 1.15–1.40)
Calcium, Ion: 1.05 mmol/L — ABNORMAL LOW (ref 1.15–1.40)
HCT: 25 % — ABNORMAL LOW (ref 39.0–52.0)
HCT: 25 % — ABNORMAL LOW (ref 39.0–52.0)
HCT: 25 % — ABNORMAL LOW (ref 39.0–52.0)
HCT: 23 % — ABNORMAL LOW (ref 39.0–52.0)
Hemoglobin: 8.5 g/dL — ABNORMAL LOW (ref 13.0–17.0)
Hemoglobin: 8.5 g/dL — ABNORMAL LOW (ref 13.0–17.0)
Hemoglobin: 8.5 g/dL — ABNORMAL LOW (ref 13.0–17.0)
Hemoglobin: 7.8 g/dL — ABNORMAL LOW (ref 13.0–17.0)
O2 Saturation: 52 %
O2 Saturation: 79 %
O2 Saturation: 100 %
O2 Saturation: 100 %
Patient temperature: 97.7
Patient temperature: 97.9
Patient temperature: 97.9
Patient temperature: 96.8
Potassium: <2 mmol/L — CL (ref 3.5–5.1)
Potassium: <2 mmol/L — CL (ref 3.5–5.1)
Potassium: <2 mmol/L — CL (ref 3.5–5.1)
Potassium: <2 mmol/L — CL (ref 3.5–5.1)
Sodium: 123 mmol/L — ABNORMAL LOW (ref 135–145)
Sodium: 124 mmol/L — ABNORMAL LOW (ref 135–145)
Sodium: 125 mmol/L — ABNORMAL LOW (ref 135–145)
Sodium: 128 mmol/L — ABNORMAL LOW (ref 135–145)
TCO2: 12 mmol/L — ABNORMAL LOW (ref 22–32)
TCO2: 13 mmol/L — ABNORMAL LOW (ref 22–32)
TCO2: 15 mmol/L — ABNORMAL LOW (ref 22–32)
TCO2: 18 mmol/L — ABNORMAL LOW (ref 22–32)
pCO2 arterial: 25.7 mmHg — ABNORMAL LOW (ref 32.0–48.0)
pCO2 arterial: 28.1 mmHg — ABNORMAL LOW (ref 32.0–48.0)
pCO2 arterial: 25.1 mmHg — ABNORMAL LOW (ref 32.0–48.0)
pCO2 arterial: 39.2 mmHg (ref 32.0–48.0)
pH, Arterial: 7.223 — ABNORMAL LOW (ref 7.350–7.450)
pH, Arterial: 7.243 — ABNORMAL LOW (ref 7.350–7.450)
pH, Arterial: 7.348 — ABNORMAL LOW (ref 7.350–7.450)
pH, Arterial: 7.245 — ABNORMAL LOW (ref 7.350–7.450)
pO2, Arterial: 31 mmHg — CL (ref 83.0–108.0)
pO2, Arterial: 49 mmHg — ABNORMAL LOW (ref 83.0–108.0)
pO2, Arterial: 270 mmHg — ABNORMAL HIGH (ref 83.0–108.0)
pO2, Arterial: 342 mmHg — ABNORMAL HIGH (ref 83.0–108.0)

## 2020-09-30 LAB — BASIC METABOLIC PANEL
Anion gap: 16 — ABNORMAL HIGH (ref 5–15)
BUN: 42 mg/dL — ABNORMAL HIGH (ref 6–20)
CO2: 11 mmol/L — ABNORMAL LOW (ref 22–32)
Calcium: 6 mg/dL — CL (ref 8.9–10.3)
Chloride: 96 mmol/L — ABNORMAL LOW (ref 98–111)
Creatinine, Ser: 2.72 mg/dL — ABNORMAL HIGH (ref 0.61–1.24)
GFR, Estimated: 26 mL/min — ABNORMAL LOW (ref >60)
Glucose, Bld: 84 mg/dL (ref 70–99)
Potassium: <2 mmol/L — CL (ref 3.5–5.1)
Sodium: 123 mmol/L — ABNORMAL LOW (ref 135–145)

## 2020-09-30 LAB — DIC (DISSEMINATED INTRAVASCULAR COAGULATION)PANEL
D-Dimer, Quant: 2.1 ug/mL-FEU — ABNORMAL HIGH (ref 0.00–0.50)
Fibrinogen: 347 mg/dL (ref 210–475)
INR: 3.6 — ABNORMAL HIGH (ref 0.8–1.2)
Platelets: 52 10*3/uL — ABNORMAL LOW (ref 150–400)
Prothrombin Time: 35.5 seconds — ABNORMAL HIGH (ref 11.4–15.2)
Smear Review: NONE SEEN
aPTT: 62 seconds — ABNORMAL HIGH (ref 24–36)

## 2020-09-30 LAB — GLUCOSE, CAPILLARY
Glucose-Capillary: 60 mg/dL — ABNORMAL LOW (ref 70–99)
Glucose-Capillary: 216 mg/dL — ABNORMAL HIGH (ref 70–99)
Glucose-Capillary: 92 mg/dL (ref 70–99)
Glucose-Capillary: 74 mg/dL (ref 70–99)
Glucose-Capillary: 114 mg/dL — ABNORMAL HIGH (ref 70–99)
Glucose-Capillary: 112 mg/dL — ABNORMAL HIGH (ref 70–99)
Glucose-Capillary: 79 mg/dL (ref 70–99)
Glucose-Capillary: 86 mg/dL (ref 70–99)

## 2020-09-30 LAB — ECHOCARDIOGRAM COMPLETE
AR max vel: 2.43 cm2
AV Area VTI: 2.31 cm2
AV Area mean vel: 2.49 cm2
AV Mean grad: 2.5 mmHg
AV Peak grad: 5 mmHg
Ao pk vel: 1.12 m/s
Area-P 1/2: 4.15 cm2
Height: 69 in
MV VTI: 1.98 cm2
S' Lateral: 3.3 cm
Weight: 1982.38 oz

## 2020-09-30 LAB — MAGNESIUM: Magnesium: 1.8 mg/dL (ref 1.7–2.4)

## 2020-09-30 LAB — COMPREHENSIVE METABOLIC PANEL
ALT: 18 U/L (ref 0–44)
ALT: 17 U/L (ref 0–44)
ALT: 18 U/L (ref 0–44)
AST: 46 U/L — ABNORMAL HIGH (ref 15–41)
AST: 41 U/L (ref 15–41)
AST: 39 U/L (ref 15–41)
Albumin: 1.5 g/dL — ABNORMAL LOW (ref 3.5–5.0)
Albumin: 1.8 g/dL — ABNORMAL LOW (ref 3.5–5.0)
Albumin: 1.5 g/dL — ABNORMAL LOW (ref 3.5–5.0)
Alkaline Phosphatase: 107 U/L (ref 38–126)
Alkaline Phosphatase: 94 U/L (ref 38–126)
Alkaline Phosphatase: 98 U/L (ref 38–126)
Anion gap: 13 (ref 5–15)
Anion gap: 15 (ref 5–15)
Anion gap: 13 (ref 5–15)
BUN: 41 mg/dL — ABNORMAL HIGH (ref 6–20)
BUN: 40 mg/dL — ABNORMAL HIGH (ref 6–20)
BUN: 36 mg/dL — ABNORMAL HIGH (ref 6–20)
CO2: 15 mmol/L — ABNORMAL LOW (ref 22–32)
CO2: 15 mmol/L — ABNORMAL LOW (ref 22–32)
CO2: 14 mmol/L — ABNORMAL LOW (ref 22–32)
Calcium: 6.4 mg/dL — CL (ref 8.9–10.3)
Calcium: 6.4 mg/dL — CL (ref 8.9–10.3)
Calcium: 6.5 mg/dL — ABNORMAL LOW (ref 8.9–10.3)
Chloride: 98 mmol/L (ref 98–111)
Chloride: 97 mmol/L — ABNORMAL LOW (ref 98–111)
Chloride: 99 mmol/L (ref 98–111)
Creatinine, Ser: 2.22 mg/dL — ABNORMAL HIGH (ref 0.61–1.24)
Creatinine, Ser: 2.18 mg/dL — ABNORMAL HIGH (ref 0.61–1.24)
Creatinine, Ser: 2.17 mg/dL — ABNORMAL HIGH (ref 0.61–1.24)
GFR, Estimated: 34 mL/min — ABNORMAL LOW (ref >60)
GFR, Estimated: 34 mL/min — ABNORMAL LOW (ref >60)
GFR, Estimated: 35 mL/min — ABNORMAL LOW (ref >60)
Glucose, Bld: 100 mg/dL — ABNORMAL HIGH (ref 70–99)
Glucose, Bld: 113 mg/dL — ABNORMAL HIGH (ref 70–99)
Glucose, Bld: 123 mg/dL — ABNORMAL HIGH (ref 70–99)
Potassium: <2 mmol/L — CL (ref 3.5–5.1)
Potassium: <2 mmol/L — CL (ref 3.5–5.1)
Potassium: <2 mmol/L — CL (ref 3.5–5.1)
Sodium: 126 mmol/L — ABNORMAL LOW (ref 135–145)
Sodium: 127 mmol/L — ABNORMAL LOW (ref 135–145)
Sodium: 126 mmol/L — ABNORMAL LOW (ref 135–145)
Total Bilirubin: 0.8 mg/dL (ref 0.3–1.2)
Total Bilirubin: 0.8 mg/dL (ref 0.3–1.2)
Total Bilirubin: 1.1 mg/dL (ref 0.3–1.2)
Total Protein: 3.6 g/dL — ABNORMAL LOW (ref 6.5–8.1)
Total Protein: 3.8 g/dL — ABNORMAL LOW (ref 6.5–8.1)
Total Protein: 3.5 g/dL — ABNORMAL LOW (ref 6.5–8.1)

## 2020-09-30 LAB — CBC
Hemoglobin: 8.3 g/dL — ABNORMAL LOW (ref 13.0–17.0)
Platelets: 82 10*3/uL — ABNORMAL LOW (ref 150–400)
WBC: 8.9 10*3/uL (ref 4.0–10.5)

## 2020-09-30 LAB — URINE CULTURE: Culture: >=100000 — AB

## 2020-09-30 LAB — TROPONIN I (HIGH SENSITIVITY): Troponin I (High Sensitivity): 69 ng/L — ABNORMAL HIGH (ref <18)

## 2020-09-30 LAB — NA AND K (SODIUM & POTASSIUM), RAND UR
Potassium Urine: 32 mmol/L
Sodium, Ur: 58 mmol/L

## 2020-09-30 LAB — STREP PNEUMONIAE URINARY ANTIGEN: Strep Pneumo Urinary Antigen: POSITIVE — AB

## 2020-09-30 LAB — PHOSPHORUS
Phosphorus: 1.4 mg/dL — ABNORMAL LOW (ref 2.5–4.6)
Phosphorus: 1.6 mg/dL — ABNORMAL LOW (ref 2.5–4.6)
Phosphorus: 2.1 mg/dL — ABNORMAL LOW (ref 2.5–4.6)

## 2020-09-30 LAB — PROTIME-INR
INR: 3.7 — ABNORMAL HIGH (ref 0.8–1.2)
Prothrombin Time: 36.6 seconds — ABNORMAL HIGH (ref 11.4–15.2)

## 2020-09-30 LAB — APTT: aPTT: 56 seconds — ABNORMAL HIGH (ref 24–36)

## 2020-09-30 MED ORDER — CALCIUM GLUCONATE-NACL 2-0.675 GM/100ML-% IV SOLN
2.0000 g | Freq: Once | INTRAVENOUS | Status: AC
  Administered 2020-09-30: 2000 mg via INTRAVENOUS
  Filled 2020-09-30: qty 100

## 2020-09-30 MED ORDER — POTASSIUM CHLORIDE 20 MEQ PO PACK
40.0000 meq | PACK | ORAL | Status: DC
  Administered 2020-09-30 – 2020-10-01 (×5): 40 meq
  Filled 2020-09-30 (×6): qty 2

## 2020-09-30 MED ORDER — PERFLUTREN LIPID MICROSPHERE
1.0000 mL | INTRAVENOUS | Status: AC | PRN
  Administered 2020-09-30: 3 mL via INTRAVENOUS
  Filled 2020-09-30: qty 10

## 2020-09-30 MED ORDER — DEXTROSE-NACL 5-0.9 % IV SOLN: INTRAVENOUS | Status: DC

## 2020-09-30 MED ORDER — ETOMIDATE 2 MG/ML IV SOLN
10.0000 mg | Freq: Once | INTRAVENOUS | Status: AC
  Administered 2020-09-30: 10 mg via INTRAVENOUS
  Filled 2020-09-30: qty 10

## 2020-09-30 MED ORDER — POTASSIUM PHOSPHATES 15 MMOLE/5ML IV SOLN
30.0000 mmol | Freq: Once | INTRAVENOUS | Status: AC
  Administered 2020-09-30: 30 mmol via INTRAVENOUS
  Filled 2020-09-30 (×2): qty 10

## 2020-09-30 MED ORDER — ASPIRIN EC 81 MG PO TBEC: 81.0000 mg | DELAYED_RELEASE_TABLET | Freq: Every day | ORAL | Status: DC

## 2020-09-30 MED ORDER — SODIUM CHLORIDE 0.9 % IV BOLUS
2000.0000 mL | Freq: Once | INTRAVENOUS | Status: AC
  Administered 2020-09-30: 2000 mL via INTRAVENOUS

## 2020-09-30 MED ORDER — ROCURONIUM BROMIDE 10 MG/ML (PF) SYRINGE
50.0000 mg | PREFILLED_SYRINGE | Freq: Once | INTRAVENOUS | Status: AC
  Administered 2020-09-30: 50 mg via INTRAVENOUS
  Filled 2020-09-30: qty 10

## 2020-09-30 MED ORDER — POTASSIUM CHLORIDE 10 MEQ/50ML IV SOLN
10.0000 meq | INTRAVENOUS | Status: AC
  Administered 2020-09-30 (×6): 10 meq via INTRAVENOUS
  Filled 2020-09-30 (×6): qty 50

## 2020-09-30 MED ORDER — SODIUM CHLORIDE 0.9% FLUSH
10.0000 mL | INTRAVENOUS | Status: DC | PRN
  Administered 2020-10-04 – 2020-10-05 (×2): 10 mL

## 2020-09-30 MED ORDER — SODIUM CHLORIDE 0.9 % IV SOLN: INTRAVENOUS | Status: DC | PRN

## 2020-09-30 MED ORDER — CHLORHEXIDINE GLUCONATE 0.12% ORAL RINSE (MEDLINE KIT)
15.0000 mL | Freq: Two times a day (BID) | OROMUCOSAL | Status: DC
  Administered 2020-09-30 – 2020-10-07 (×14): 15 mL via OROMUCOSAL

## 2020-09-30 MED ORDER — DEXTROSE 50 % IV SOLN: 1.0000 | Freq: Once | INTRAVENOUS | Status: AC

## 2020-09-30 MED ORDER — DEXTROSE 50 % IV SOLN
INTRAVENOUS | Status: AC
  Administered 2020-09-30: 1 via INTRAVENOUS
  Filled 2020-09-30: qty 50

## 2020-09-30 MED ORDER — POTASSIUM CHLORIDE 10 MEQ/50ML IV SOLN
10.0000 meq | INTRAVENOUS | Status: AC
  Administered 2020-09-30 – 2020-10-01 (×10): 10 meq via INTRAVENOUS
  Filled 2020-09-30 (×10): qty 50

## 2020-09-30 MED ORDER — ALBUMIN HUMAN 5 % IV SOLN
25.0000 g | Freq: Once | INTRAVENOUS | Status: AC
  Administered 2020-09-30: 25 g via INTRAVENOUS
  Filled 2020-09-30: qty 500

## 2020-09-30 MED ORDER — PIPERACILLIN-TAZOBACTAM 3.375 G IVPB
3.3750 g | Freq: Three times a day (TID) | INTRAVENOUS | Status: DC
  Administered 2020-09-30 – 2020-10-01 (×2): 3.375 g via INTRAVENOUS
  Filled 2020-09-30 (×3): qty 50

## 2020-09-30 MED ORDER — THIAMINE HCL 100 MG/ML IJ SOLN
500.0000 mg | Freq: Every day | INTRAVENOUS | Status: DC
  Filled 2020-09-30: qty 5

## 2020-09-30 MED ORDER — VASOPRESSIN 20 UNITS/100 ML INFUSION FOR SHOCK
0.0000 [IU]/min | INTRAVENOUS | Status: DC
  Administered 2020-09-30 (×2): 0.03 [IU]/min via INTRAVENOUS
  Administered 2020-10-01 – 2020-10-02 (×6): 0.04 [IU]/min via INTRAVENOUS
  Filled 2020-09-30 (×7): qty 100

## 2020-09-30 MED ORDER — SODIUM CHLORIDE 0.9% IV SOLUTION: Freq: Once | INTRAVENOUS | Status: AC

## 2020-09-30 MED ORDER — ORAL CARE MOUTH RINSE
15.0000 mL | OROMUCOSAL | Status: DC
  Administered 2020-09-30 – 2020-10-07 (×66): 15 mL via OROMUCOSAL

## 2020-09-30 MED ORDER — SODIUM CHLORIDE 0.9% FLUSH
10.0000 mL | Freq: Two times a day (BID) | INTRAVENOUS | Status: DC
  Administered 2020-09-30 – 2020-10-05 (×13): 10 mL
  Administered 2020-10-06: 30 mL
  Administered 2020-10-06 – 2020-10-07 (×2): 10 mL

## 2020-09-30 MED ORDER — ASPIRIN 300 MG RE SUPP
150.0000 mg | Freq: Every day | RECTAL | Status: DC
  Administered 2020-09-30: 150 mg via RECTAL
  Filled 2020-09-30: qty 1

## 2020-09-30 NOTE — Progress Notes (Addendum)
eLink Physician-Brief Progress Note Patient Name: Dominic Phillips DOB: Apr 16, 1961 MRN: [id number]   Date of Service  09/30/2020  HPI/Events of Note  Patient in atrial fibrillation with a currently controlled ventricular response rate of 80 to 96, patient is hemodynamically stable. Arrhythmia likely secondary to profound electrolyte disturbance (hypokalemia). EKG shows antero-lateral ST changes r/o myocardial ischemia.   eICU Interventions  As long as ventricular response rate is controlled and patient is hemodynamically stable will focus on aggressive repletion of electrolytes,  If rate accelerates and / or blood pressure drops will treat with Amiodarone. He is heparinized, will cycle Troponin and order a.m. echocardiogram. Will order aspirin.        Kerry Kass Wylder Macomber 09/30/2020, 5:18 AM

## 2020-09-30 NOTE — Progress Notes (Signed)
eLink Physician-Brief Progress Note Patient Name: Dominic Phillips DOB: [birth year] MRN: [id number]   Date of Service  09/30/2020  HPI/Events of Note  Patient with a left flank hematoma extending into his scrotum, development has been relatively rapid. Patient has abnormal platelets and coagulation parameters, he has also had a change in mental status per bedside RN.  eICU Interventions  PCCM Ground [employer] asked to come and assess the hematoma at bedside, stat PT-INR, PTT, CBC CT abdomen / pelvis r/o retroperitoneal hematoma, CT brain r/o ICH ordered, Heparin and Aspirin discontinued, coagulopathy will be reversed depending on results of ordered tests.        Kerry Kass Payten Beaumier 09/30/2020, 7:51 PM

## 2020-09-30 NOTE — Progress Notes (Signed)
Assessed left hip/flank hematoma with off-going RN. Hematoma has expanded and now extends down left leg, with ecchymosis extended down left leg, groin, and across penis and scrotum.  Dr. Lucile Shutters on camera assessing patient.

## 2020-09-30 NOTE — Progress Notes (Signed)
  Echocardiogram 2D Echocardiogram has been performed with Definity.  Dominic Phillips 09/30/2020, 9:13 AM

## 2020-09-30 NOTE — Progress Notes (Signed)
Pt transported on the Vent to CT and back to his room 2M08 without any complications.

## 2020-09-30 NOTE — Progress Notes (Addendum)
eLink Physician-Brief Progress Note Patient Name: Dominic Phillips DOB: 1961-05-10 MRN: [id number]   Date of Service  09/30/2020  HPI/Events of Note  INR 3.6 but no evidence of DIC, CT imaging negative x 2, K+ and PHOS are low but replacement is on going.  eICU Interventions  Will give 2 units of FFP to partially correct  The coagulopathy, monitor H & H and platelet count. Informed consent for transfusion of FFP obtained from patient's daughter [name].        Kerry Kass Advaith Lamarque 09/30/2020, 10:22 PM

## 2020-09-30 NOTE — Progress Notes (Signed)
Critical ABG called to elink. Patient irregularly breathing.

## 2020-09-30 NOTE — Progress Notes (Signed)
Night Team Event note- brief  Dominic Phillips is 60 year old gentleman with hx of COPD, colectomy and alcoholism.  He was admitted on 4/16/ 22 because of altered mental status and is being treated for multiple medical conditions that were reviewed. I was asked by the [employer] team to do a bedside evaluation because of worsening left flank hematoma that has spread into the groin and scrotum.  Vitals reviewed.  Patient is on Levophed, vasopressin and Bicarbonate drips.  Neuro: + corneal, gag and cough reflexes CVS: HR 89, MAP 71, S1, S2 heard Resp: good A/E on 40% FIO2 vent support GI: colostomy-productive of blackish output consistent with GI bleed Renal: foley in place ID: [id number] Skin: diffuse purpura rash involving the left flank, upper left thigh left groin and into the scrotum. ? Dermatome delineation. The rash/ecchymosis of the scrotum appears to me limited to the skin rather than bleeding into the scrotum.   Intervention: DIC profile, CBC, type and screen, HIT profile. Elink has already ordered CT scan, stopped Heparin and Aspirin. Elevation of the scrotum. Transfuse per protocol.  Thank you for allowing me the privilege to assist with this patient's care

## 2020-09-30 NOTE — Progress Notes (Signed)
    Critical lab results K<2.0, and Calcium  6.4 MD. Lizbeth Bark notify.  and new orders are in place to address it. Nurse will continue to monitor.

## 2020-09-30 NOTE — Procedures (Signed)
Intubation Procedure Note  Airik Goodlin  [id number]  05-10-1961  Date:09/30/20  Time:10:56 AM   Provider Performing:Asuna Peth    Procedure: Intubation (31500)  Indication(s) Respiratory Failure  Consent Risks of the procedure as well as the alternatives and risks of each were explained to the patient and/or caregiver.  Consent for the procedure was obtained and is signed in the bedside chart   Anesthesia Etomidate and Rocuronium   Time Out Verified patient identification, verified procedure, site/side was marked, verified correct patient position, special equipment/implants available, medications/allergies/relevant history reviewed, required imaging and test results available.   Sterile Technique Usual hand hygeine, masks, and gloves were used   Procedure Description Patient positioned in bed supine.  Sedation given as noted above.  Patient was intubated with endotracheal tube using Direct laryngoscope.  View was Grade 1 full glottis .  Number of attempts was 1.  Colorimetric CO2 detector was consistent with tracheal placement.   Complications/Tolerance None; patient tolerated the procedure well. Chest X-ray is ordered to verify placement.   EBL Minimal   Specimen(s) None

## 2020-09-30 NOTE — Progress Notes (Addendum)
Rhythm changes noted on cardiac monitor- EKG performed/transmitted into medical record. Elink/MD notified- Marked ST abnormality, inferior and anterior subendocardial injury, new atrial fibrillation.  Elink also notified of glucose of 60 which improved with amp of D50.

## 2020-09-30 NOTE — Progress Notes (Signed)
Post intubation ABG obtained on ventilator settings of VT: 560, RR: 16, FIO2: 100%, and PEEP: 5.0.  Increased RR to 20 and decreased FIO2 to 50%.  Will continue to monitor.    Ref. Range 09/30/2020 11:35  Sample type Unknown ARTERIAL  pH, Arterial Latest Ref Range: 7.350 - 7.450  7.245 (L)  pCO2 arterial Latest Ref Range: 32.0 - 48.0 mmHg 39.2  pO2, Arterial Latest Ref Range: 83.0 - 108.0 mmHg 342 (H)  TCO2 Latest Ref Range: 22 - 32 mmol/L 18 (L)  Acid-base deficit Latest Ref Range: 0.0 - 2.0 mmol/L 10.0 (H)  Bicarbonate Latest Ref Range: 20.0 - 28.0 mmol/L 17.2 (L)  O2 Saturation Latest Units: % 100.0  Patient temperature Unknown 96.8 F  Collection site Unknown art line

## 2020-09-30 NOTE — Progress Notes (Signed)
MD notified- total 4 K-riders given. No further potassium ordered at this time.  MD to place further orders.

## 2020-09-30 NOTE — Progress Notes (Signed)
[place] Progress Note Patient Name: Dominic Phillips DOB: Dec 01, 1960 MRN: [id number]   Date of Service  09/30/2020  HPI/Events of Note  Repeat K+ < 2.0, corrected Calcium 8.3 gm / dl.  eICU Interventions  KCL 60 meq iv over 6 hours via CVC, Calcium gluconate 2 gm iv x 1        Alexcis Bicking U Derrico Zhong 09/30/2020, 3:58 AM

## 2020-09-30 NOTE — Progress Notes (Signed)
[place] Progress Note Patient Name: Sael Furches DOB: 09/17/1960 MRN: [id number]   Date of Service  09/30/2020  HPI/Events of Note  Bedside RN requesting review of CXR and clearing NG tube for use.  eICU Interventions  NG tube in good position, okay to use.        Kerry Kass Jadan Hinojos 09/30/2020, 10:48 PM

## 2020-09-30 NOTE — Progress Notes (Signed)
Disregard previous note from Dr Lucile Shutters, patient is not heparinized.

## 2020-09-30 NOTE — Progress Notes (Signed)
   MD. Jacky Kindle was notify at 1546 of new left abdominal hematoma, noted during assessment. MD. Respond 1558 that he would come and see the patient. No new orders at this time will continue to monitor.

## 2020-09-30 NOTE — Progress Notes (Signed)
eLink Physician-Brief Progress Note Patient Name: Dominic Phillips DOB: 13-Feb-1961 MRN: [id number]   Date of Service  09/30/2020  HPI/Events of Note  Patient with profound hypoxemia. Unfortunately creatinine is 2.72 precluding CTA chest to r/o PE, he does have modest right lung infiltrates.  eICU Interventions  100 % non re-breather mask ordered with repeat ABG in one hour. Patient is already on Zosyn for aspiration pneumonia.        Kerry Kass Ambrosio Reuter 09/30/2020, 5:59 AM

## 2020-09-30 NOTE — Progress Notes (Signed)
PHARMACY NOTE:  ANTIMICROBIAL RENAL DOSAGE ADJUSTMENT  Current antimicrobial regimen includes a mismatch between antimicrobial dosage and estimated renal function.  As per policy approved by the Pharmacy & Therapeutics and Medical Executive Committees, the antimicrobial dosage will be adjusted accordingly.  Current antimicrobial dosage:  Zosyn 3.375g IV q12h extended infusion.   Indication: Sepsis  Renal Function:  Estimated Creatinine Clearance: 29.2 mL/min (A) (by C-G formula based on SCr of 2.22 mg/dL (H)). []      On intermittent HD, scheduled: []      On CRRT    Antimicrobial dosage has been changed to:  Zosyn 3.375g IV q8h extended infusion.   Additional comments:   Thank you for allowing pharmacy to be a part of this patient's care.  Sloan Leiter, PharmD, BCPS, BCCCP Clinical Pharmacist Please refer to [facility] for [place] numbers 09/30/2020 3:35 PM

## 2020-09-30 NOTE — Progress Notes (Addendum)
NAME:  Dominic Phillips, MRN:  [id number], DOB:  Sep 01, 1960, LOS: 1 ADMISSION DATE:  09/28/2020, CONSULTATION DATE: 09/21/2020 REFERRING MD:  Quintella Reichert, CHIEF COMPLAINT: Altered mental status  History of Present Illness:  Patient is encephalopathic so most of the history is taken from chart review  60 year old male who was brought into the emergency department with a generalized weakness and altered mental status.  Per chart review patient had a fall about 10 days ago where he hit his head but did not get evaluated since then he has not been eating well, started with nausea and vomiting and started with generalized weakness and feeling confused. Today he was feeling dizzy with ambulation so he was sent to the emergency department. Of note patient has history of alcohol abuse but he could not drink due to nausea and vomiting for about a week  In the emergency department was noted to be hypotensive despite IV fluid resuscitation and with severe electrolyte imbalance, critical care was consulted for evaluation and admission to ICU CT head and cervical spine is pending Pertinent  Medical History  COPD Status post colectomy with endcolostomy Alcohol abuse Tobacco dependence  Significant Hospital Events: Including procedures, antibiotic start and stop dates in addition to other pertinent events   . 4/16 admitted . 4/17 ETT  Interim History / Subjective:  Patient remained encephalopathic, severely dry and hypokalemic He started with coffee-ground emesis, was not protecting his airway, needed to be intubated  Objective   Blood pressure (!) 144/68, pulse 81, temperature 98 F (36.7 C), temperature source Axillary, resp. rate 20, height 5\' 9"  (1.753 m), weight 56.2 kg, SpO2 100 %.    Vent Mode: PRVC FiO2 (%):  [50 %-100 %] 50 % Set Rate:  [16 bmp-20 bmp] 20 bmp Vt Set:  [560 mL] 560 mL PEEP:  [5 cmH20] 5 cmH20 Plateau Pressure:  [22 cmH20] 22 cmH20   Intake/Output Summary (Last 24  hours) at 09/30/2020 1334 Last data filed at 09/30/2020 1100 Gross per 24 hour  Intake 9328.76 ml  Output 1330 ml  Net 7998.76 ml   Filed Weights   09/30/20 0342  Weight: 56.2 kg    Examination:   Physical exam: General: Chronically ill-appearing male, orally intubated HEENT: [place]/AT, eyes anicteric.  Severely dry mucous membranes, ETT and NGT in place Neuro: Stuporous, not following commands Chest: Bilateral crackles right more than left, no wheezes or rhonchi Heart: Regular rate and rhythm, no murmurs or gallops Abdomen: Soft, nontender, nondistended, bowel sounds present.  Colostomy in place with greenish watery stool in the bag Skin: No rash   Labs/imaging that I havepersonally reviewed  (right click and "Reselect all SmartList Selections" daily)  Serum potassium less than 2 ABGs: 7.2 4/39/342  Resolved Hospital Problem list     Assessment & Plan:  Acute gastroenteritis infectious versus inflammatory Septic and hypovolemic shock from aspiration pneumonia and severe dehydration Volume hyponatremia Severe hypokalemia Hypocalcemia A. fib with RVR/nonsustained V. tach due to severe electrolyte imbalance Acute kidney injury due to severe dehydration High anion gap metabolic acidosis due to frequent nausea and vomiting, without respiratory compensation Acute hypoxic respiratory failure due to aspiration pneumonia Status post fall with head injury, intracranial injury was ruled out with negative head CT Acute metabolic encephalopathy Alcohol abuse Tobacco dependence Coffee-ground emesis due to upper GI bleeding from possible Mallory-Weiss  Despite aggressive IV fluid therapy patient still looks severely dry and dehydrated Continue aggressive IV fluid resuscitation Follow-up stool studies Continue aggressive electrolyte supplement and monitor  Patient went to A. fib with RVR last night followed by nonsustained beats of V. tach due to severe electrolyte imbalance, he is  getting aggressive supplementation Currently in sinus rhythm Auditor serum creatinine and electrolyte every 6 hours Patient x-ray chest is suggestive of right lower lobe/middle lobe pneumonia could be due to aspiration from nausea and vomiting Continue IV Zosyn Continue lung protective ventilation Follow-up respiratory culture Head CT was negative for acute intracranial injury Avoid sedation Continue high-dose thiamine and folate Continue nicotine patch to prevent nicotine withdrawal Continue Levophed/vasopressin with map goal 65 Continue Protonix twice daily Monitor H&H Best practice (right click and "Reselect all SmartList Selections" daily)  Diet:  NPO, start tube feeds Pain/Anxiety/Delirium protocol (if indicated): No VAP protocol (if indicated): Not indicated DVT prophylaxis: Subcutaneous Heparin GI prophylaxis: N/A Glucose control:  SSI No Central venous access:  N/A Arterial line:  N/A Foley:  Yes, and it is still needed Mobility:  bed rest  PT consulted: N/A Last date of multidisciplinary goals of care discussion: 4/17 [spoke with patient's daughter, patient was made DNR, continue aggressive medical care but if fails then proceed with comfort care] Code Status: DNR Disposition: ICU  Labs   CBC: Recent Labs  Lab 10/04/2020 1400 09/28/2020 1408 10/12/2020 1958 09/14/2020 2155 09/30/20 0230 09/30/20 0505 09/30/20 0527 09/30/20 0733 09/30/20 1135  WBC 16.9*  --  10.4 7.8 8.9  --   --   --   --   NEUTROABS 14.0*  --  2.5 7.4  --   --   --   --   --   HGB 9.0*   < > 7.3* 7.7* 8.3* 8.5* 8.5* 8.5* 7.8*  HCT RESULTS UNAVAILABLE DUE TO INTERFERING SUBSTANCE   < > 20.7* 20.9* RESULTS UNAVAILABLE DUE TO INTERFERING SUBSTANCE 25.0* 25.0* 25.0* 23.0*  MCV RESULTS UNAVAILABLE DUE TO INTERFERING SUBSTANCE  --  100.0 97.7 RESULTS UNAVAILABLE DUE TO INTERFERING SUBSTANCE  --   --   --   --   PLT 149*  --  99* 100* 82*  --   --   --   --    < > = values in this interval not displayed.     Basic Metabolic Panel: Recent Labs  Lab 09/22/2020 1400 09/28/2020 1408 09/16/2020 1543 10/11/2020 2000 09/18/2020 2155 09/30/20 0230 09/30/20 0505 09/30/20 0527 09/30/20 0733 09/30/20 1135  NA 117*   < >  --  <102* 121* 123* 123* 124* 125* 128*  K <2.0*   < >  --  <2.0* <2.0* <2.0* <2.0* <2.0* <2.0* <2.0*  CL 83*  --   --  77* 94* 96*  --   --   --   --   CO2 7*  --   --  8* 9* 11*  --   --   --   --   GLUCOSE 106*  --   --  929* 123* 84  --   --   --   --   BUN 53*  --   --  38* 43* 42*  --   --   --   --   CREATININE 4.24*  --  3.33* 2.77* 2.95* 2.72*  --   --   --   --   CALCIUM 5.9*  --   --  4.5* 5.1* 6.0*  --   --   --   --   MG 2.0  --  1.8  --   --   --   --   --   --   --  PHOS  --   --  3.3  --   --   --   --   --   --   --    < > = values in this interval not displayed.   GFR: Estimated Creatinine Clearance: 23.8 mL/min (A) (by C-G formula based on SCr of 2.72 mg/dL (H)). Recent Labs  Lab 09/23/2020 1400 09/16/2020 1543 10/12/2020 1552 10/04/2020 1958 10/13/2020 2155 10/10/2020 2225 09/30/20 0230  PROCALCITON  --  31.99  --   --   --   --   --   WBC 16.9*  --   --  10.4 7.8  --  8.9  LATICACIDVEN 1.5  --  2.0*  --   --  1.6  --     Liver Function Tests: Recent Labs  Lab 10/10/2020 1400 10/08/2020 2000 09/14/2020 2155  AST 61* 56* 51*  ALT 27 24 22   ALKPHOS 143* 129* 122  BILITOT 1.3* 0.7 0.9  PROT 3.8* 3.3* 3.8*  ALBUMIN 1.2* 1.0* 1.7*   Recent Labs  Lab 10/13/2020 1543  LIPASE 63*  AMYLASE 36   No results for input(s): AMMONIA in the last 168 hours.  ABG    Component Value Date/Time   PHART 7.245 (L) 09/30/2020 1135   PCO2ART 39.2 09/30/2020 1135   PO2ART 342 (H) 09/30/2020 1135   HCO3 17.2 (L) 09/30/2020 1135   TCO2 18 (L) 09/30/2020 1135   ACIDBASEDEF 10.0 (H) 09/30/2020 1135   O2SAT 100.0 09/30/2020 1135     Coagulation Profile: Recent Labs  Lab 10/06/2020 1400 09/25/2020 1958 10/04/2020 2155  INR 2.1* 3.3* 2.5*    Cardiac Enzymes: No results for  input(s): CKTOTAL, CKMB, CKMBINDEX, TROPONINI in the last 168 hours.  HbA1C: No results found for: HGBA1C  CBG: Recent Labs  Lab 09/30/20 0429 09/30/20 0443 09/30/20 0741 09/30/20 1117 09/30/20 1230  GLUCAP 60* 216* 92 74 114*    Total critical care time: 49 minutes  Performed by: [place] care time was exclusive of separately billable procedures and treating other patients.   Critical care was necessary to treat or prevent imminent or life-threatening deterioration.   Critical care was time spent personally by me on the following activities: development of treatment plan with patient and/or surrogate as well as nursing, discussions with consultants, evaluation of patient's response to treatment, examination of patient, obtaining history from patient or surrogate, ordering and performing treatments and interventions, ordering and review of laboratory studies, ordering and review of radiographic studies, pulse oximetry and re-evaluation of patient's condition.   Jacky Kindle MD [facility] See Amion for pager If no response to pager, please call [phone] until 7pm After 7pm, Please call E-link [phone]

## 2020-09-30 NOTE — Procedures (Signed)
Arterial Catheter Insertion Procedure Note  Dominic Phillips  [id number]  1960/06/24  Date:09/30/20  Time:11:40 AM    Provider Performing: Judith Part    Procedure: Insertion of Arterial Line [phone]) without US guidance  Indication(s) Blood pressure monitoring and/or need for frequent ABGs  Consent Unable to obtain consent due to emergent nature of procedure.  Anesthesia None   Time Out Verified patient identification, verified procedure, site/side was marked, verified correct patient position, special equipment/implants available, medications/allergies/relevant history reviewed, required imaging and test results available.   Sterile Technique Maximal sterile technique including full sterile barrier drape, hand hygiene, sterile gown, sterile gloves, mask, hair covering, sterile ultrasound probe cover (if used).   Procedure Description Area of catheter insertion was cleaned with chlorhexidine and draped in sterile fashion. Without real-time ultrasound guidance an arterial catheter was placed into the left radial artery.  Appropriate arterial tracings confirmed on monitor.     Complications/Tolerance None; patient tolerated the procedure well.   EBL Minimal   Specimen(s) None

## 2020-10-01 ENCOUNTER — Inpatient Hospital Stay (HOSPITAL_COMMUNITY): Payer: Medicaid Other

## 2020-10-01 DIAGNOSIS — R579 Shock, unspecified: Secondary | ICD-10-CM | POA: Diagnosis not present

## 2020-10-01 LAB — CBC
HCT: 19.6 % — ABNORMAL LOW (ref 39.0–52.0)
HCT: 20.1 % — ABNORMAL LOW (ref 39.0–52.0)
Hemoglobin: 7 g/dL — ABNORMAL LOW (ref 13.0–17.0)
Hemoglobin: 7.2 g/dL — ABNORMAL LOW (ref 13.0–17.0)
Hemoglobin: 7.1 g/dL — ABNORMAL LOW (ref 13.0–17.0)
MCH: 32.9 pg (ref 26.0–34.0)
MCH: 31.8 pg (ref 26.0–34.0)
MCHC: 36.7 g/dL — ABNORMAL HIGH (ref 30.0–36.0)
MCHC: 35.3 g/dL (ref 30.0–36.0)
MCV: 89.5 fL (ref 80.0–100.0)
MCV: 90.1 fL (ref 80.0–100.0)
Platelets: 28 10*3/uL — CL (ref 150–400)
Platelets: 28 10*3/uL — CL (ref 150–400)
Platelets: 27 10*3/uL — CL (ref 150–400)
RBC: 2.19 MIL/uL — ABNORMAL LOW (ref 4.22–5.81)
RBC: 2.23 MIL/uL — ABNORMAL LOW (ref 4.22–5.81)
RDW: 19.2 % — ABNORMAL HIGH (ref 11.5–15.5)
RDW: 19.9 % — ABNORMAL HIGH (ref 11.5–15.5)
WBC: 18.8 10*3/uL — ABNORMAL HIGH (ref 4.0–10.5)
WBC: 18.5 10*3/uL — ABNORMAL HIGH (ref 4.0–10.5)
WBC: 17.3 10*3/uL — ABNORMAL HIGH (ref 4.0–10.5)
nRBC: 0.2 % (ref 0.0–0.2)
nRBC: 0.2 % (ref 0.0–0.2)

## 2020-10-01 LAB — BASIC METABOLIC PANEL
Anion gap: 17 — ABNORMAL HIGH (ref 5–15)
BUN: 34 mg/dL — ABNORMAL HIGH (ref 6–20)
CO2: 14 mmol/L — ABNORMAL LOW (ref 22–32)
Calcium: 6.4 mg/dL — CL (ref 8.9–10.3)
Chloride: 97 mmol/L — ABNORMAL LOW (ref 98–111)
Creatinine, Ser: 1.88 mg/dL — ABNORMAL HIGH (ref 0.61–1.24)
GFR, Estimated: 41 mL/min — ABNORMAL LOW (ref >60)
Glucose, Bld: 87 mg/dL (ref 70–99)
Potassium: 3.5 mmol/L (ref 3.5–5.1)
Sodium: 128 mmol/L — ABNORMAL LOW (ref 135–145)

## 2020-10-01 LAB — URINE CULTURE

## 2020-10-01 LAB — C DIFFICILE QUICK SCREEN W PCR REFLEX
C Diff antigen: NEGATIVE
C Diff interpretation: NOT DETECTED
C Diff toxin: NEGATIVE

## 2020-10-01 LAB — COMPREHENSIVE METABOLIC PANEL
ALT: 21 U/L (ref 0–44)
AST: 64 U/L — ABNORMAL HIGH (ref 15–41)
Albumin: 1.7 g/dL — ABNORMAL LOW (ref 3.5–5.0)
Alkaline Phosphatase: 85 U/L (ref 38–126)
Anion gap: 18 — ABNORMAL HIGH (ref 5–15)
BUN: 36 mg/dL — ABNORMAL HIGH (ref 6–20)
CO2: 14 mmol/L — ABNORMAL LOW (ref 22–32)
Calcium: 6.1 mg/dL — CL (ref 8.9–10.3)
Chloride: 98 mmol/L (ref 98–111)
Creatinine, Ser: 2.14 mg/dL — ABNORMAL HIGH (ref 0.61–1.24)
GFR, Estimated: 35 mL/min — ABNORMAL LOW (ref >60)
Glucose, Bld: 137 mg/dL — ABNORMAL HIGH (ref 70–99)
Potassium: 2.8 mmol/L — ABNORMAL LOW (ref 3.5–5.1)
Sodium: 130 mmol/L — ABNORMAL LOW (ref 135–145)
Total Bilirubin: 1 mg/dL (ref 0.3–1.2)
Total Protein: 3.8 g/dL — ABNORMAL LOW (ref 6.5–8.1)

## 2020-10-01 LAB — POCT I-STAT EG7
Acid-base deficit: 21 mmol/L — ABNORMAL HIGH (ref 0.0–2.0)
Bicarbonate: 5.9 mmol/L — ABNORMAL LOW (ref 20.0–28.0)
Calcium, Ion: 0.72 mmol/L — CL (ref 1.15–1.40)
HCT: 28 % — ABNORMAL LOW (ref 39.0–52.0)
Hemoglobin: 9.5 g/dL — ABNORMAL LOW (ref 13.0–17.0)
O2 Saturation: 99 %
Potassium: <2 mmol/L — CL (ref 3.5–5.1)
Sodium: 116 mmol/L — CL (ref 135–145)
TCO2: 6 mmol/L — ABNORMAL LOW (ref 22–32)
pCO2, Ven: 16.1 mmHg — CL (ref 44.0–60.0)
pH, Ven: 7.17 — CL (ref 7.250–7.430)
pO2, Ven: 149 mmHg — ABNORMAL HIGH (ref 32.0–45.0)

## 2020-10-01 LAB — APTT: aPTT: 45 seconds — ABNORMAL HIGH (ref 24–36)

## 2020-10-01 LAB — HEPATITIS PANEL, ACUTE
HCV Ab: NONREACTIVE
Hep A IgM: NONREACTIVE
Hep B C IgM: NONREACTIVE
Hepatitis B Surface Ag: NONREACTIVE

## 2020-10-01 LAB — AMMONIA: Ammonia: 101 umol/L — ABNORMAL HIGH (ref 9–35)

## 2020-10-01 LAB — PROTIME-INR
INR: 2 — ABNORMAL HIGH (ref 0.8–1.2)
INR: 1.6 — ABNORMAL HIGH (ref 0.8–1.2)
INR: 1.6 — ABNORMAL HIGH (ref 0.8–1.2)
Prothrombin Time: 22.7 seconds — ABNORMAL HIGH (ref 11.4–15.2)
Prothrombin Time: 18.7 seconds — ABNORMAL HIGH (ref 11.4–15.2)
Prothrombin Time: 19.3 seconds — ABNORMAL HIGH (ref 11.4–15.2)

## 2020-10-01 LAB — LACTIC ACID, PLASMA
Lactic Acid, Venous: 7 mmol/L (ref 0.5–1.9)
Lactic Acid, Venous: 6.8 mmol/L (ref 0.5–1.9)
Lactic Acid, Venous: 6.8 mmol/L (ref 0.5–1.9)
Lactic Acid, Venous: 7.3 mmol/L (ref 0.5–1.9)

## 2020-10-01 LAB — POCT I-STAT 7, (LYTES, BLD GAS, ICA,H+H)
Acid-base deficit: 9 mmol/L — ABNORMAL HIGH (ref 0.0–2.0)
Bicarbonate: 15.2 mmol/L — ABNORMAL LOW (ref 20.0–28.0)
Calcium, Ion: 0.92 mmol/L — ABNORMAL LOW (ref 1.15–1.40)
HCT: 22 % — ABNORMAL LOW (ref 39.0–52.0)
Hemoglobin: 7.5 g/dL — ABNORMAL LOW (ref 13.0–17.0)
O2 Saturation: 90 %
Patient temperature: 97.8
Potassium: 4.1 mmol/L (ref 3.5–5.1)
Sodium: 129 mmol/L — ABNORMAL LOW (ref 135–145)
TCO2: 16 mmol/L — ABNORMAL LOW (ref 22–32)
pCO2 arterial: 27.2 mmHg — ABNORMAL LOW (ref 32.0–48.0)
pH, Arterial: 7.354 (ref 7.350–7.450)
pO2, Arterial: 59 mmHg — ABNORMAL LOW (ref 83.0–108.0)

## 2020-10-01 LAB — MRSA PCR SCREENING: MRSA by PCR: NEGATIVE

## 2020-10-01 LAB — GLUCOSE, CAPILLARY
Glucose-Capillary: 170 mg/dL — ABNORMAL HIGH (ref 70–99)
Glucose-Capillary: 51 mg/dL — ABNORMAL LOW (ref 70–99)
Glucose-Capillary: 93 mg/dL (ref 70–99)
Glucose-Capillary: 58 mg/dL — ABNORMAL LOW (ref 70–99)
Glucose-Capillary: 85 mg/dL (ref 70–99)
Glucose-Capillary: 87 mg/dL (ref 70–99)
Glucose-Capillary: 93 mg/dL (ref 70–99)
Glucose-Capillary: 90 mg/dL (ref 70–99)

## 2020-10-01 LAB — PHOSPHORUS
Phosphorus: 2.7 mg/dL (ref 2.5–4.6)
Phosphorus: 3.6 mg/dL (ref 2.5–4.6)

## 2020-10-01 LAB — CK: Total CK: 665 U/L — ABNORMAL HIGH (ref 49–397)

## 2020-10-01 LAB — FIBRINOGEN: Fibrinogen: 283 mg/dL (ref 210–475)

## 2020-10-01 LAB — MAGNESIUM
Magnesium: 1.4 mg/dL — ABNORMAL LOW (ref 1.7–2.4)
Magnesium: 2.8 mg/dL — ABNORMAL HIGH (ref 1.7–2.4)

## 2020-10-01 LAB — LACTOFERRIN, FECAL, QUALITATIVE: Lactoferrin, Fecal, Qual: POSITIVE — AB

## 2020-10-01 LAB — OCCULT BLOOD X 1 CARD TO LAB, STOOL: Fecal Occult Bld: POSITIVE — AB

## 2020-10-01 LAB — PREPARE RBC (CROSSMATCH)

## 2020-10-01 MED ORDER — VITAMIN K1 10 MG/ML IJ SOLN
10.0000 mg | Freq: Once | INTRAVENOUS | Status: AC
  Administered 2020-10-01: 10 mg via INTRAVENOUS
  Filled 2020-10-01: qty 1

## 2020-10-01 MED ORDER — SODIUM CHLORIDE 0.9 % IV SOLN
2.0000 g | INTRAVENOUS | Status: AC
  Administered 2020-10-01 – 2020-10-05 (×5): 2 g via INTRAVENOUS
  Filled 2020-10-01 (×5): qty 20

## 2020-10-01 MED ORDER — ADULT MULTIVITAMIN LIQUID CH
15.0000 mL | Freq: Every day | ORAL | Status: DC
  Administered 2020-10-01 – 2020-10-06 (×6): 15 mL
  Filled 2020-10-01 (×8): qty 15

## 2020-10-01 MED ORDER — LACTULOSE 10 GM/15ML PO SOLN
10.0000 g | Freq: Three times a day (TID) | ORAL | Status: DC
  Administered 2020-10-01 (×2): 10 g
  Filled 2020-10-01 (×3): qty 15

## 2020-10-01 MED ORDER — SODIUM CHLORIDE 0.9% IV SOLUTION: Freq: Once | INTRAVENOUS | Status: AC

## 2020-10-01 MED ORDER — ALBUMIN HUMAN 5 % IV SOLN
12.5000 g | Freq: Four times a day (QID) | INTRAVENOUS | Status: AC
  Administered 2020-10-01 – 2020-10-02 (×4): 12.5 g via INTRAVENOUS
  Filled 2020-10-01 (×4): qty 250

## 2020-10-01 MED ORDER — LACTATED RINGERS IV BOLUS
1000.0000 mL | Freq: Once | INTRAVENOUS | Status: AC
  Administered 2020-10-01: 1000 mL via INTRAVENOUS

## 2020-10-01 MED ORDER — FOLIC ACID 1 MG PO TABS
1.0000 mg | ORAL_TABLET | Freq: Every day | ORAL | Status: DC
  Administered 2020-10-01 – 2020-10-07 (×7): 1 mg
  Filled 2020-10-01 (×7): qty 1

## 2020-10-01 MED ORDER — CALCIUM GLUCONATE-NACL 2-0.675 GM/100ML-% IV SOLN
2.0000 g | Freq: Once | INTRAVENOUS | Status: AC
  Administered 2020-10-01: 2000 mg via INTRAVENOUS
  Filled 2020-10-01: qty 100

## 2020-10-01 MED ORDER — POTASSIUM CHLORIDE 10 MEQ/50ML IV SOLN
10.0000 meq | INTRAVENOUS | Status: AC
  Administered 2020-10-01 (×6): 10 meq via INTRAVENOUS
  Filled 2020-10-01 (×6): qty 50

## 2020-10-01 MED ORDER — DEXTROSE 50 % IV SOLN: 50.0000 mL | Freq: Once | INTRAVENOUS | Status: AC

## 2020-10-01 MED ORDER — THIAMINE HCL 100 MG PO TABS
100.0000 mg | ORAL_TABLET | Freq: Every day | ORAL | Status: DC
  Administered 2020-10-04 – 2020-10-07 (×4): 100 mg
  Filled 2020-10-01 (×4): qty 1

## 2020-10-01 MED ORDER — SODIUM BICARBONATE 8.4 % IV SOLN
INTRAVENOUS | Status: DC
  Filled 2020-10-01 (×3): qty 1000

## 2020-10-01 MED ORDER — FENTANYL CITRATE (PF) 100 MCG/2ML IJ SOLN
25.0000 ug | INTRAMUSCULAR | Status: DC | PRN
Start: 2020-10-01 — End: 2020-10-05
  Administered 2020-10-01: 25 ug via INTRAVENOUS
  Administered 2020-10-01: 50 ug via INTRAVENOUS
  Administered 2020-10-02: 100 ug via INTRAVENOUS
  Administered 2020-10-02 (×3): 50 ug via INTRAVENOUS
  Administered 2020-10-03: 100 ug via INTRAVENOUS
  Administered 2020-10-03: 50 ug via INTRAVENOUS
  Administered 2020-10-05: 100 ug via INTRAVENOUS
  Filled 2020-10-01 (×9): qty 2

## 2020-10-01 MED ORDER — MAGNESIUM SULFATE 4 GM/100ML IV SOLN
4.0000 g | Freq: Once | INTRAVENOUS | Status: AC
  Administered 2020-10-01: 4 g via INTRAVENOUS
  Filled 2020-10-01: qty 100

## 2020-10-01 MED ORDER — VITAL AF 1.2 CAL PO LIQD
1000.0000 mL | ORAL | Status: DC
  Administered 2020-10-01 – 2020-10-07 (×7): 1000 mL
  Filled 2020-10-01 (×5): qty 1000

## 2020-10-01 MED ORDER — DEXTROSE 50 % IV SOLN
INTRAVENOUS | Status: AC
  Administered 2020-10-01: 50 mL via INTRAVENOUS
  Filled 2020-10-01: qty 50

## 2020-10-01 MED ORDER — ALBUMIN HUMAN 5 % IV SOLN
25.0000 g | Freq: Once | INTRAVENOUS | Status: AC
  Administered 2020-10-01: 25 g via INTRAVENOUS
  Filled 2020-10-01: qty 500

## 2020-10-01 MED ORDER — THIAMINE HCL 100 MG/ML IJ SOLN
500.0000 mg | Freq: Every day | INTRAVENOUS | Status: AC
  Administered 2020-10-01 – 2020-10-03 (×3): 500 mg via INTRAVENOUS
  Filled 2020-10-01 (×3): qty 5

## 2020-10-01 NOTE — Progress Notes (Signed)
Per Dr. Lucile Shutters, ok to proceed with transfusion at this time.

## 2020-10-01 NOTE — Progress Notes (Signed)
Respiratory rate elevated- 33/min, patient moves arms intermittently. Grimaces to pain.  Increased swelling to scrotum and penis and ecchymosis has increased along leg border previously drawn and increased to scrotum.  [place] notified. Patient assessed on camera by elink staff.

## 2020-10-01 NOTE — Progress Notes (Addendum)
eLink Physician-Brief Progress Note Patient Name: Dominic Phillips DOB: May 13, 1961 MRN: [id number]   Date of Service  10/01/2020  HPI/Events of Note  Patient's scrotum is enlarging, he needs something PRN for sedation,Levophed requirement has increased.  eICU Interventions  Ultrasound of the scrotum ordered for this a.m, Vasopressin increased to 0.04,  PT-INR and PTT ordered, CBC result is pending, urology needs to be consulted in a.m. PRN Fentanyl ordered for sedation on the ventilator.        Kerry Kass Basir Niven 10/01/2020, 4:39 AM

## 2020-10-01 NOTE — Progress Notes (Signed)
NAME:  Dominic Phillips, MRN:  [id number], DOB:  [birth year], LOS: 2 ADMISSION DATE:  09/27/2020, CONSULTATION DATE: 09/17/2020 REFERRING MD:  Quintella Reichert, CHIEF COMPLAINT: Altered mental status  History of Present Illness:  Patient is encephalopathic so most of the history is taken from chart review  60 year old male who was brought into the emergency department with a generalized weakness and altered mental status.  Per chart review patient had a fall about 10 days ago where he hit his head but did not get evaluated since then he has not been eating well, started with nausea and vomiting and started with generalized weakness and feeling confused. Today he was feeling dizzy with ambulation so he was sent to the emergency department. 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 4/16 admitted . 4/17 ETT  Interim History / Subjective:  Overnight remains on vasopressors therapy. developed bruising to scrotum and left thigh. INR 3.7, given 2 units of FFP. K 2.8, receiving supplementation now   Objective   Blood pressure 108/72, pulse 93, temperature 97.8 F (36.6 C), temperature source Axillary, resp. rate (!) 34, height 5\' 9"  (1.753 m), weight 56.2 kg, SpO2 99 %.    Vent Mode: PRVC FiO2 (%):  [40 %-100 %] 40 % Set Rate:  [16 bmp-20 bmp] 20 bmp Vt Set:  [560 mL] 560 mL PEEP:  [5 cmH20] 5 cmH20 Plateau Pressure:  [22  cmH20-32 cmH20] 28 cmH20   Intake/Output Summary (Last 24 hours) at 10/01/2020 0488 Last data filed at 10/01/2020 8916 Gross per 24 hour  Intake 9978.59 ml  Output 1685 ml  Net 8293.59 ml   Filed Weights   09/30/20 0342  Weight: 56.2 kg    Examination: Physical exam: General: Chronically ill-appearing male HEENT: NG and ETT in place, sclera edema   Neuro: alert, does not follow commands, pupils 3 mm and reactive  Chest: Crackles noted, vent assisted breath sounds  Heart: Tachy, no MRG Abdomen: Soft, bowel sounds present, Colostomy in place with dark output  Skin: bruising noted to left lower flank and thigh, scrotum with swelling and bruising    Resolved Hospital Problem list   A. fib with RVR/nonsustained V. tach due to severe electrolyte imbalance  Assessment & Plan:   Septic and hypovolemic shock from aspiration pneumonia and severe dehydration Acute gastroenteritis infectious versus inflammatory -ECHO 4/17 with EF 50-55 Plan -Titrate Vasopressors for MAP goal >65. Currently on 24 Levophed and 0.03 Vasopressin  -Strep P Positive. Deescalate to Rocephin. Continue to follow culture data  -Trial Albumin for 4 doses.  -Obtain C.Diff antigen   Partial Small Bowel Obstruction  -CT A/P with Partial small bowel obstruction with point of transition involving single loop of bowel herniated within a right paraumbilical hernia. Proximal small bowel appears mildly distended and fluid-filled H/O colectomy with endcolostomy due to diverticulitis with perforation 2018  Plan -ABD soft, active bowel sounds, passing stool. If lactic acid continues to rise despite treatment will consult  surgery.   Scrotal Swelling  Plan -Patient is volume overloaded within the tissues. U/S Scrotum with trace bilateral hydrocele, left greater then right, if no improvement will consult urology   Acute kidney injury due to severe dehydration High anion gap metabolic acidosis Volume hyponatremia Severe  hypokalemia Hypocalcemia Hypomagnesemia  Plan -Trend BMP -Replacing K, Mag, Calcium this AM -Trend Lactic Acid > Currently up-trending  -Continue Bicarb gtt   Acute hypoxic respiratory failure due to aspiration pneumonia Tobacco dependence Plan -Continue Vent Support, encephalopathy barrier to extubation  -VAP Prevention Bundle   Acute metabolic encephalopathy Alcohol abuse Plan -Obtain Amnionia level  -Avoid Sedating medications  -Continue Thiamine, add folic acid and multivitamin   Coffee-ground emesis due to upper GI bleeding from possible Mallory-Weiss Plan -Continue BID PPI -Obtain Occult Blood via stool  -Trend CBC -Avoid Anticoagulation   Coagulopathy secondary to sepsis, liver dysfunction Plan  -Send Fib Level  -Received 2 units FFP overnight -Give 2 units PLTs now    -Trend Levels   Best practice (right click and "Reselect all SmartList Selections" daily)  Diet:  NPO, start tube feeds Pain/Anxiety/Delirium protocol (if indicated): No VAP protocol (if indicated): Not indicated DVT prophylaxis: SCD GI prophylaxis: PPI Glucose control:  SSI No Central venous access:  Yes, and it is still needed Arterial line:  Yes, and it is still needed Foley:  Yes, and it is still needed Mobility:  bed rest  PT consulted: N/A Last date of multidisciplinary goals of care discussion:  patient's  patient made DNR, continue aggressive medical care but if fails then proceed with comfort care. Will update family today.  Code Status: DNR Disposition: ICU  Labs   CBC: Recent Labs  Lab 10/04/2020 1400 10/11/2020 1408 10/06/2020 1958 09/16/2020 2155 09/30/20 0230 09/30/20 0505 09/30/20 0527 09/30/20 0733 09/30/20 1135 09/30/20 1954 09/30/20 2014 10/01/20 0419  WBC 16.9*  --  10.4 7.8 8.9  --   --   --   --  17.5*  --  18.8*  NEUTROABS 14.0*  --  2.5 7.4  --   --   --   --   --  15.6*  --   --   HGB 9.0*   < > 7.3* 7.7* 8.3*   < > 8.5* 8.5* 7.8* 7.6*  --  7.0*  HCT RESULTS  UNAVAILABLE DUE TO INTERFERING SUBSTANCE   < > 20.7* 20.9* RESULTS UNAVAILABLE DUE TO INTERFERING SUBSTANCE   < > 25.0* 25.0* 23.0* RESULTS UNAVAILABLE DUE TO INTERFERING SUBSTANCE  --  RESULTS UNAVAILABLE DUE TO INTERFERING SUBSTANCE  MCV RESULTS UNAVAILABLE DUE TO INTERFERING SUBSTANCE  --  100.0 97.7 RESULTS UNAVAILABLE DUE TO INTERFERING SUBSTANCE  --   --   --   --  RESULTS UNAVAILABLE DUE TO INTERFERING SUBSTANCE  --  RESULTS UNAVAILABLE DUE TO INTERFERING SUBSTANCE  PLT 149*  --  99* 100* 82*  --   --   --   --  52* 52* 28*   < > = values in this interval not displayed.    Basic Metabolic Panel: Recent Labs  Lab 09/25/2020 1400 10/01/2020 1408 09/30/2020 1543 09/24/2020 2000 09/30/20 0230 09/30/20 0505 09/30/20 0933 09/30/20 1135 09/30/20 1436 09/30/20 1954 10/01/20 0419  NA 117*   < >  --    < > 123*   < > 126* 128* 127* 126* 130*  K <2.0*   < >  --    < > <2.0*   < > <2.0* <2.0* <2.0* <2.0* 2.8*  CL 83*  --   --    < > 96*  --  98  --  97* 99 98  CO2 7*  --   --    < > 11*  --  15*  --  15* 14* 14*  GLUCOSE 106*  --   --    < > 84  --  100*  --  113* 123* 137*  BUN 53*  --   --    < > 42*  --  41*  --  40* 36* 36*  CREATININE 4.24*  --  3.33*   < > 2.72*  --  2.22*  --  2.18* 2.17* 2.14*  CALCIUM 5.9*  --   --    < > 6.0*  --  6.4*  --  6.4* 6.5* 6.1*  MG 2.0  --  1.8  --   --   --  1.8  --   --   --   --   PHOS  --   --  3.3  --   --   --  1.4*  --  1.6* 2.1* 2.7   < > = values in this interval not displayed.   GFR: Estimated Creatinine Clearance: 30.3 mL/min (A) (by C-G formula based on SCr of 2.14 mg/dL (H)). Recent Labs  Lab 09/25/2020 1400 09/25/2020 1543 10/11/2020 1552 10/04/2020 1958 10/02/2020 2155 10/06/2020 2225 09/30/20 0230 09/30/20 1954 10/01/20 0419  PROCALCITON  --  31.99  --   --   --   --   --   --   --   WBC 16.9*  --   --    < > 7.8  --  8.9 17.5* 18.8*  LATICACIDVEN 1.5  --  2.0*  --   --  1.6  --   --   --    < > = values in this interval not displayed.     Liver Function Tests: Recent Labs  Lab 10/09/2020 2155 09/30/20 0933 09/30/20 1436 09/30/20 1954 10/01/20 0419  AST 51* 46* 41 39 64*  ALT 22 18 17 18 21   ALKPHOS 122 107 94 98 85  BILITOT 0.9 0.8 0.8 1.1 1.0  PROT 3.8* 3.6* 3.8* 3.5* 3.8*  ALBUMIN 1.7* 1.5* 1.8* 1.5* 1.7*   Recent Labs  Lab 10/08/2020 1543  LIPASE 63*  AMYLASE 36   No results for input(s): AMMONIA in the last 168 hours.  ABG    Component Value Date/Time   PHART 7.245 (L) 09/30/2020 1135   PCO2ART 39.2 09/30/2020 1135   PO2ART 342 (H) 09/30/2020 1135   HCO3 17.2 (L) 09/30/2020 1135   TCO2 18 (L) 09/30/2020 1135   ACIDBASEDEF 10.0 (H) 09/30/2020 1135   O2SAT 100.0 09/30/2020 1135     Coagulation Profile: Recent Labs  Lab 09/18/2020 1958 09/30/2020 2155 09/30/20 1954 09/30/20 2014 10/01/20 0600  INR 3.3* 2.5* 3.7* 3.6* 2.0*    Cardiac Enzymes: No results for input(s): CKTOTAL, CKMB, CKMBINDEX, TROPONINI in the last 168 hours.  HbA1C: No results found for: HGBA1C  CBG: Recent Labs  Lab 09/30/20 1908 09/30/20 2315 10/01/20 0305 10/01/20 0325 10/01/20 0717  GLUCAP 79 86 51* 170* 93    Total critical care time: 42 minutes   Critical care time was exclusive of separately billable procedures and treating other patients.   Critical care was necessary to treat or prevent imminent or life-threatening deterioration.   Critical care was time spent personally by me on the following activities: development  of treatment plan with patient and/or surrogate as well as nursing, discussions with consultants, evaluation of patient's response to treatment, examination of patient, obtaining history from patient or surrogate, ordering and performing treatments and interventions, ordering and review of laboratory studies, ordering and review of radiographic studies, pulse oximetry and re-evaluation of patient's condition.   Hayden Pedro, AGACNP-BC [facility]

## 2020-10-01 NOTE — Progress Notes (Signed)
Dr. Celesta Aver rounded on patient once more. Aware of CT abd/pelvis results.  Scrotum ecchymosis continues at this time and is purple.

## 2020-10-01 NOTE — Progress Notes (Signed)
Dusky nailbeds observed to left hand. Dr. Lucile Shutters on camera assessing patient at this time.

## 2020-10-01 NOTE — Progress Notes (Signed)
Family- daughter [name] stated additional questions for MD regarding blood transfusion. Per MD, RN to hold on blood transfusion until questions answered.

## 2020-10-01 NOTE — Progress Notes (Signed)
[facility] Progress Note Patient Name: Dominic Phillips DOB: 1961/03/13 MRN: [id number]   Date of Service  10/01/2020  HPI/Events of Note  K+ 2.8, Corrected calcium 7.9.  eICU Interventions  KCL 10 meq iv Q 1 hour x 6, Calcium gluconate  2 gm iv x  1.        Kerry Kass Dyllan Kats 10/01/2020, 6:19 AM

## 2020-10-01 NOTE — Progress Notes (Signed)
Initial Nutrition Assessment  DOCUMENTATION CODES:   Severe malnutrition in context of chronic illness,Underweight  INTERVENTION:   Initiate tube feeding via OG tube: Vital AF 1.2 at 20 ml/h, increase by 10 ml every 12 hours to goal rate of 70 ml/h (1680 ml per day)  Provides 2016 kcal, 126 gm protein, 1362 ml free water daily.  Monitor magnesium, potassium, and phosphorus daily for at least 3 days, MD to replete as needed, as pt is at risk for refeeding syndrome given severe malnutrition, low K and mag, hx alcoholism.  NUTRITION DIAGNOSIS:   Severe Malnutrition related to chronic illness (COPD, alcohol abuse) as evidenced by severe muscle depletion,severe fat depletion.  GOAL:   Patient will meet greater than or equal to 90% of their needs  MONITOR:   TF tolerance,Vent status,Labs,Skin,I & O's  REASON FOR ASSESSMENT:   Ventilator,Consult Enteral/tube feeding initiation and management  ASSESSMENT:   60 yo male admitted with encephalopathy, sepsis. PMH includes COPD, colectomy, colostomy, alcoholism.   Discussed patient in ICU rounds and with RN today. L flank/hip hematoma extending into scrotum discovered 4/17.  Partial SBO per CT abd/pelvis.  Abdomen is soft, passing stool.  Received MD Consult for TF initiation and management. OG tube in place. Will start TF at a low rate and advance slowly d/t refeeding risk with hx of alcohol abuse, severe malnutrition, and low K and mag.   Patient is currently intubated on ventilator support MV: 20 L/min Temp (24hrs), Avg:98.3 F (36.8 C), Min:97.3 F (36.3 C), Max:99.3 F (37.4 C)   Labs reviewed. Na 129, K 2.8, Mag 1.4 CBG: [phone]  Medications reviewed and include Levophed, vasopressin, KCl, calcium gluconate.  I/O +14.3 L since admission UOP 950 ml x 24 h Stool OP 465 ml x 24 h (colostomy)  Meets criteria for severe malnutrition with severe depletion of muscle and subcutaneous fat mass.  NUTRITION - FOCUSED  PHYSICAL EXAM:  Flowsheet Row Most Recent Value  Orbital Region Severe depletion  Upper Arm Region Unable to assess  Thoracic and Lumbar Region Severe depletion  Buccal Region Severe depletion  Temple Region Severe depletion  Clavicle Bone Region Mild depletion  Clavicle and Acromion Bone Region Moderate depletion  Scapular Bone Region Severe depletion  Dorsal Hand Unable to assess  Patellar Region No depletion  Anterior Thigh Region No depletion  Posterior Calf Region Mild depletion  Edema (RD Assessment) Mild  Hair Reviewed  Eyes Unable to assess  Mouth Unable to assess  Skin Reviewed  Nails Reviewed       Diet Order:   Diet Order            Diet NPO time specified  Diet effective now                 EDUCATION NEEDS:   Not appropriate for education at this time  Skin:  Skin Assessment: Skin Integrity Issues: Skin Integrity Issues:: Stage I,Incisions Stage I: sacrum Incisions: R chest  Last BM:  4/18 (colostomy)  Height:   Ht Readings from Last 1 Encounters:  09/30/20 5\' 9"  (1.753 m)    Weight:   Wt Readings from Last 1 Encounters:  09/30/20 56.2 kg    Ideal Body Weight:  72.7 kg  BMI:  Body mass index is 18.3 kg/m.  Estimated Nutritional Needs:   Kcal:  1980  Protein:  100-115 gm  Fluid:  >/= 2 L    Lucas Mallow, RD, LDN, CNSC Please refer to Amion for contact information.

## 2020-10-01 NOTE — Progress Notes (Signed)
[facility] Progress Note Patient Name: [name] DOB: 01-Aug-1960 MRN: [id number]   Date of Service  10/01/2020  HPI/Events of Note  Mr. Brandenburg's daughter [name] called the bedside RN to say that she had a couple of questions about the blood transfusion. I called her back but the call went to voicemail, so I left her a message indicating that I would put the FFP transfusion on hold pending a conversation with her, and requesting a call back.  eICU Interventions  See above.        Kerry Kass Dylon Correa 10/01/2020, 12:34 AM

## 2020-10-01 NOTE — Progress Notes (Signed)
eLink Physician-Brief Progress Note Patient Name: Caprice Wasko DOB: 02-10-1961 MRN: [id number]   Date of Service  10/01/2020  HPI/Events of Note  Patient with coagulopathy and expanding hematoma. FFP already transfused.  eICU Interventions  Vitamin K 10 mg iv x 1.        Kerry Kass Johnnye Sandford 10/01/2020, 5:17 AM

## 2020-10-02 DIAGNOSIS — E43 Unspecified severe protein-calorie malnutrition: Secondary | ICD-10-CM | POA: Diagnosis not present

## 2020-10-02 DIAGNOSIS — R579 Shock, unspecified: Secondary | ICD-10-CM | POA: Diagnosis not present

## 2020-10-02 LAB — HEPATIC FUNCTION PANEL
ALT: 22 U/L (ref 0–44)
AST: 43 U/L — ABNORMAL HIGH (ref 15–41)
Albumin: 2.2 g/dL — ABNORMAL LOW (ref 3.5–5.0)
Alkaline Phosphatase: 73 U/L (ref 38–126)
Bilirubin, Direct: 0.8 mg/dL — ABNORMAL HIGH (ref 0.0–0.2)
Indirect Bilirubin: 0.7 mg/dL (ref 0.3–0.9)
Total Bilirubin: 1.5 mg/dL — ABNORMAL HIGH (ref 0.3–1.2)
Total Protein: 4.1 g/dL — ABNORMAL LOW (ref 6.5–8.1)

## 2020-10-02 LAB — POCT I-STAT 7, (LYTES, BLD GAS, ICA,H+H)
Acid-base deficit: 4 mmol/L — ABNORMAL HIGH (ref 0.0–2.0)
Bicarbonate: 19.8 mmol/L — ABNORMAL LOW (ref 20.0–28.0)
Calcium, Ion: 0.93 mmol/L — ABNORMAL LOW (ref 1.15–1.40)
HCT: 22 % — ABNORMAL LOW (ref 39.0–52.0)
Hemoglobin: 7.5 g/dL — ABNORMAL LOW (ref 13.0–17.0)
O2 Saturation: 86 %
Patient temperature: 99.4
Potassium: 3.2 mmol/L — ABNORMAL LOW (ref 3.5–5.1)
Sodium: 131 mmol/L — ABNORMAL LOW (ref 135–145)
TCO2: 21 mmol/L — ABNORMAL LOW (ref 22–32)
pCO2 arterial: 32 mmHg (ref 32.0–48.0)
pH, Arterial: 7.401 (ref 7.350–7.450)
pO2, Arterial: 52 mmHg — ABNORMAL LOW (ref 83.0–108.0)

## 2020-10-02 LAB — BASIC METABOLIC PANEL
Anion gap: 15 (ref 5–15)
Anion gap: 12 (ref 5–15)
BUN: 31 mg/dL — ABNORMAL HIGH (ref 6–20)
BUN: 30 mg/dL — ABNORMAL HIGH (ref 6–20)
CO2: 20 mmol/L — ABNORMAL LOW (ref 22–32)
CO2: 19 mmol/L — ABNORMAL LOW (ref 22–32)
Calcium: 6.4 mg/dL — CL (ref 8.9–10.3)
Calcium: 6.6 mg/dL — ABNORMAL LOW (ref 8.9–10.3)
Chloride: 97 mmol/L — ABNORMAL LOW (ref 98–111)
Chloride: 97 mmol/L — ABNORMAL LOW (ref 98–111)
Creatinine, Ser: 1.66 mg/dL — ABNORMAL HIGH (ref 0.61–1.24)
Creatinine, Ser: 1.55 mg/dL — ABNORMAL HIGH (ref 0.61–1.24)
GFR, Estimated: 48 mL/min — ABNORMAL LOW (ref >60)
GFR, Estimated: 52 mL/min — ABNORMAL LOW (ref >60)
Glucose, Bld: 122 mg/dL — ABNORMAL HIGH (ref 70–99)
Glucose, Bld: 151 mg/dL — ABNORMAL HIGH (ref 70–99)
Potassium: 2.9 mmol/L — ABNORMAL LOW (ref 3.5–5.1)
Potassium: 3.6 mmol/L (ref 3.5–5.1)
Sodium: 132 mmol/L — ABNORMAL LOW (ref 135–145)
Sodium: 128 mmol/L — ABNORMAL LOW (ref 135–145)

## 2020-10-02 LAB — GLUCOSE, CAPILLARY
Glucose-Capillary: 116 mg/dL — ABNORMAL HIGH (ref 70–99)
Glucose-Capillary: 90 mg/dL (ref 70–99)
Glucose-Capillary: 87 mg/dL (ref 70–99)
Glucose-Capillary: 135 mg/dL — ABNORMAL HIGH (ref 70–99)
Glucose-Capillary: 131 mg/dL — ABNORMAL HIGH (ref 70–99)
Glucose-Capillary: 159 mg/dL — ABNORMAL HIGH (ref 70–99)

## 2020-10-02 LAB — CBC
HCT: 19.6 % — ABNORMAL LOW (ref 39.0–52.0)
Hemoglobin: 7.3 g/dL — ABNORMAL LOW (ref 13.0–17.0)
MCH: 32.9 pg (ref 26.0–34.0)
MCHC: 37.2 g/dL — ABNORMAL HIGH (ref 30.0–36.0)
MCV: 88.3 fL (ref 80.0–100.0)
Platelets: 13 10*3/uL — CL (ref 150–400)
RBC: 2.22 MIL/uL — ABNORMAL LOW (ref 4.22–5.81)
RDW: 22.5 % — ABNORMAL HIGH (ref 11.5–15.5)
WBC: 16.4 10*3/uL — ABNORMAL HIGH (ref 4.0–10.5)
nRBC: 0.2 % (ref 0.0–0.2)

## 2020-10-02 LAB — DIC (DISSEMINATED INTRAVASCULAR COAGULATION)PANEL
D-Dimer, Quant: 3.83 ug/mL-FEU — ABNORMAL HIGH (ref 0.00–0.50)
Fibrinogen: 227 mg/dL (ref 210–475)
INR: 1.5 — ABNORMAL HIGH (ref 0.8–1.2)
Platelets: 12 10*3/uL — CL (ref 150–400)
Prothrombin Time: 18 seconds — ABNORMAL HIGH (ref 11.4–15.2)
Smear Review: NONE SEEN
aPTT: 38 seconds — ABNORMAL HIGH (ref 24–36)

## 2020-10-02 LAB — BPAM FFP
Blood Product Expiration Date: 202204222359
Blood Product Expiration Date: 202204222359
ISSUE DATE / TIME: 202204180110
ISSUE DATE / TIME: 202204180110
Unit Type and Rh: 7300
Unit Type and Rh: 7300

## 2020-10-02 LAB — BPAM PLATELET PHERESIS
Blood Product Expiration Date: 202204182359
Blood Product Expiration Date: 202204182359
ISSUE DATE / TIME: 202204181018
ISSUE DATE / TIME: 202204181216
Unit Type and Rh: 600
Unit Type and Rh: 6200

## 2020-10-02 LAB — PHOSPHORUS
Phosphorus: 2.7 mg/dL (ref 2.5–4.6)
Phosphorus: 2.8 mg/dL (ref 2.5–4.6)

## 2020-10-02 LAB — PREPARE FRESH FROZEN PLASMA
Unit division: 0
Unit division: 0

## 2020-10-02 LAB — PREPARE PLATELET PHERESIS
Unit division: 0
Unit division: 0

## 2020-10-02 LAB — LACTIC ACID, PLASMA
Lactic Acid, Venous: 5.9 mmol/L (ref 0.5–1.9)
Lactic Acid, Venous: 5.4 mmol/L (ref 0.5–1.9)
Lactic Acid, Venous: 4.2 mmol/L (ref 0.5–1.9)

## 2020-10-02 LAB — CK
Total CK: 465 U/L — ABNORMAL HIGH (ref 49–397)
Total CK: 441 U/L — ABNORMAL HIGH (ref 49–397)

## 2020-10-02 LAB — MAGNESIUM
Magnesium: 2.1 mg/dL (ref 1.7–2.4)
Magnesium: 2 mg/dL (ref 1.7–2.4)

## 2020-10-02 LAB — CALCIUM, IONIZED: Calcium, Ionized, Serum: 3.8 mg/dL — ABNORMAL LOW (ref 4.5–5.6)

## 2020-10-02 MED ORDER — POTASSIUM CHLORIDE 10 MEQ/50ML IV SOLN
10.0000 meq | INTRAVENOUS | Status: AC
  Administered 2020-10-02 (×4): 10 meq via INTRAVENOUS
  Filled 2020-10-02 (×4): qty 50

## 2020-10-02 MED ORDER — RIFAXIMIN 550 MG PO TABS
550.0000 mg | ORAL_TABLET | Freq: Two times a day (BID) | ORAL | Status: DC
  Administered 2020-10-02: 550 mg via ORAL
  Filled 2020-10-02 (×2): qty 1

## 2020-10-02 MED ORDER — RIFAXIMIN 550 MG PO TABS
550.0000 mg | ORAL_TABLET | Freq: Two times a day (BID) | ORAL | Status: DC
  Administered 2020-10-02 – 2020-10-07 (×10): 550 mg
  Filled 2020-10-02 (×9): qty 1

## 2020-10-02 MED ORDER — SODIUM CHLORIDE 0.9% IV SOLUTION: Freq: Once | INTRAVENOUS | Status: AC

## 2020-10-02 MED ORDER — HYDROCORTISONE NA SUCCINATE PF 100 MG IJ SOLR
50.0000 mg | Freq: Four times a day (QID) | INTRAMUSCULAR | Status: DC
  Administered 2020-10-02 – 2020-10-04 (×8): 50 mg via INTRAVENOUS
  Filled 2020-10-02 (×8): qty 2

## 2020-10-02 MED ORDER — POTASSIUM CHLORIDE 20 MEQ PO PACK
40.0000 meq | PACK | Freq: Once | ORAL | Status: AC
  Administered 2020-10-02: 40 meq
  Filled 2020-10-02: qty 2

## 2020-10-02 MED ORDER — POLYETHYLENE GLYCOL 3350 17 G PO PACK: 17.0000 g | PACK | Freq: Every day | ORAL | Status: DC | PRN

## 2020-10-02 MED ORDER — CALCIUM GLUCONATE-NACL 2-0.675 GM/100ML-% IV SOLN
2.0000 g | Freq: Once | INTRAVENOUS | Status: AC
  Administered 2020-10-02: 2000 mg via INTRAVENOUS
  Filled 2020-10-02 (×2): qty 100

## 2020-10-02 MED ORDER — LACTULOSE 10 GM/15ML PO SOLN
20.0000 g | Freq: Three times a day (TID) | ORAL | Status: DC
  Administered 2020-10-02 – 2020-10-07 (×13): 20 g
  Filled 2020-10-02 (×13): qty 30

## 2020-10-02 MED ORDER — DEXTROSE 5 % IV SOLN: INTRAVENOUS | Status: DC

## 2020-10-02 NOTE — Progress Notes (Addendum)
NAME:  Dominic Phillips, MRN:  [id number], DOB:  30-Nov-1960, LOS: 3 ADMISSION DATE:  10/13/2020, CONSULTATION DATE: 10/04/2020 REFERRING MD:  Quintella Reichert, CHIEF COMPLAINT: Altered mental status  History of Present Illness:  Patient is encephalopathic so most of the history is taken from chart review  60 year old male who was brought into the emergency department with a generalized weakness and altered mental status.  Per chart review patient had a fall about 10 days ago where he hit his head but did not get evaluated since then he has not been eating well, started with nausea and vomiting and started with generalized weakness and feeling confused. Today he was feeling dizzy with ambulation so he was sent to the emergency department. 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 4/16 admitted . 4/17 ETT . 4/18 INR 3.7, given 2 units of FFP.developed bruising to scrotum and left thigh   Interim History / Subjective:  Remains on vasopressor therapy with levophed at 28 mcg/min and vasopressin at 0.04.   Objective   Blood pressure 123/77, pulse (!) 114, temperature 99.5 F (37.5 C), temperature source Oral, resp. rate (!) 35, height 5\' 9"  (1.753 m), weight 70.5 kg, SpO2 95 %.    Vent Mode: PRVC FiO2 (%):  [40 %-60 %] 60 % Set Rate:  [20 bmp] 20 bmp Vt Set:  [560 mL] 560 mL PEEP:  [5 cmH20] 5 cmH20 Plateau Pressure:  [16  cmH20-34 cmH20] 28 cmH20   Intake/Output Summary (Last 24 hours) at 10/02/2020 8101 Last data filed at 10/02/2020 0600 Gross per 24 hour  Intake 5313.18 ml  Output 915 ml  Net 4398.18 ml   Filed Weights   09/30/20 0342 10/02/20 0338  Weight: 56.2 kg 70.5 kg    Examination: Physical exam:  General: Chronically ill-appearing male, on vent  HEENT: NG and ETT in place, sclera edema   Neuro: sedated, does not follow commands, pupils pin point Chest: Crackles noted, vent assisted breath sounds  Heart: Tachy, no MRG Abdomen: Soft, bowel sounds present, Colostomy in place with dark output  Skin: bruising noted to left lower flank and thigh, scrotum with swelling and bruising, pitting edema noted to all extremities     Resolved Hospital Problem list   A. fib with RVR/nonsustained V. tach due to severe electrolyte imbalance  Assessment & Plan:   Septic and hypovolemic shock from aspiration pneumonia and severe dehydration Acute gastroenteritis infectious versus inflammatory -ECHO 4/17 with EF 50-55 -Strep P + -C.Diff Negative  Plan -Titrate Vasopressors for MAP goal >65. Currently on 28 Levophed and 0.03 Vasopressin  -Continue Rocephin.  -Continue to follow culture data  -completing Albumin today   Partial Small Bowel Obstruction  -CT A/P with Partial small bowel obstruction with point of transition involving single loop of bowel herniated within a right paraumbilical hernia. Proximal small bowel appears mildly distended and fluid-filled H/O colectomy with endcolostomy due to diverticulitis with perforation 2018  Plan -ABD soft, active  bowel sounds, passing stool. KUB 4/18 negative for acute.   Scrotal Swelling  Plan -Patient is volume overloaded within the tissues. U/S Scrotum with trace bilateral hydrocele, left greater then right, if no improvement will consult urology   Acute kidney injury due to severe dehydration High anion gap metabolic acidosis Volume  hyponatremia Severe hypokalemia Hypocalcemia Hypomagnesemia  Plan -Trend BMP -Replacing K, Mag, Calcium this AM -Trend Lactic Acid > down-trending -D/C Bicarb gtt.   Acute hypoxic respiratory failure due to aspiration pneumonia Tobacco dependence Plan -Continue Vent Support, encephalopathy barrier to extubation  -VAP Prevention Bundle   Acute metabolic encephalopathy -Ammonia 101 -Acute Hepatitis Negative  Alcohol abuse Plan -Continue Lactulose  -Trend LFT -Avoid Sedating medications  -Continue Thiamine, folic acid and multivitamin   Coffee-ground emesis due to upper GI bleeding from possible Mallory-Weiss -Occult blood via stool + Plan -Continue BID PPI -Trend CBC -Avoid Anticoagulation   Hypoglycemia  Plan -Started TF on 4/18 at low rate with slow taper  -Start Dextrose gtt with d/c dextrose bicarb as above  Coagulopathy secondary to sepsis, liver dysfunction -Fib 227 -S/P 2 units FFP, 2 units PLT, 1 unit RBC  Plan  -Receiving 1 unit PLT now per Elink  -Trend Levels   Best practice (right click and "Reselect all SmartList Selections" daily)  Diet:  NPO, start tube feeds Pain/Anxiety/Delirium protocol (if indicated): No VAP protocol (if indicated): Not indicated DVT prophylaxis: SCD GI prophylaxis: PPI Glucose control:  SSI No Central venous access:  Yes, and it is still needed Arterial line:  Yes, and it is still needed Foley:  Yes, and it is still needed Mobility:  bed rest  PT consulted: N/A Last date of multidisciplinary goals of care discussion:  patient's  patient made DNR, continue aggressive medical care but if fails then proceed with comfort care. Will update family today.  Code Status: DNR Disposition: ICU  Labs   CBC: Recent Labs  Lab 10/10/2020 1400 09/21/2020 1408 10/06/2020 1958 10/09/2020 2155 09/30/20 0230 09/30/20 1954 09/30/20 2014 10/01/20 0419 10/01/20 0848 10/01/20 1624 10/01/20 1706 10/02/20 0459 10/02/20 0837  WBC 16.9*  --   10.4 7.8   < > 17.5*  --  18.8*  --  18.5* 17.3* 16.4*  --   NEUTROABS 14.0*  --  2.5 7.4  --  15.6*  --   --   --   --   --   --   --   HGB 9.0*   < > 7.3* 7.7*   < > 7.6*  --  7.0* 7.5* 7.2* 7.1* 7.3* 7.5*  HCT RESULTS UNAVAILABLE DUE TO INTERFERING SUBSTANCE   < > 20.7* 20.9*   < > RESULTS UNAVAILABLE DUE TO INTERFERING SUBSTANCE  --  RESULTS UNAVAILABLE DUE TO INTERFERING SUBSTANCE 22.0* 19.6* 20.1* 19.6* 22.0*  MCV RESULTS UNAVAILABLE DUE TO INTERFERING SUBSTANCE  --  100.0 97.7   < > RESULTS UNAVAILABLE DUE TO INTERFERING SUBSTANCE  --  RESULTS UNAVAILABLE DUE TO INTERFERING SUBSTANCE  --  89.5 90.1 88.3  --   PLT 149*  --  99* 100*   < > 52* 52* 28*  --  28* 27* 12*  13*  --    < > = values in this interval not displayed.    Basic Metabolic Panel: Recent Labs  Lab 10/12/2020 1543 10/13/2020 2000 09/30/20 0933 09/30/20 1135 09/30/20 1436 09/30/20 1954 10/01/20 0419 10/01/20 0848 10/01/20 1000 10/01/20 1306 10/01/20 1354 10/02/20 0459 10/02/20 0837  NA  --    < >  126*   < > 127* 126* 130* 129*  --  128*  --  132* 131*  K  --    < > <2.0*   < > <2.0* <2.0* 2.8* 4.1  --  3.5  --  2.9* 3.2*  CL  --    < > 98  --  97* 99 98  --   --  97*  --  97*  --   CO2  --    < > 15*  --  15* 14* 14*  --   --  14*  --  20*  --   GLUCOSE  --    < > 100*  --  113* 123* 137*  --   --  87  --  122*  --   BUN  --    < > 41*  --  40* 36* 36*  --   --  34*  --  31*  --   CREATININE 3.33*   < > 2.22*  --  2.18* 2.17* 2.14*  --   --  1.88*  --  1.66*  --   CALCIUM  --    < > 6.4*  --  6.4* 6.5* 6.1*  --   --  6.4*  --  6.4*  --   MG 1.8  --  1.8  --   --   --   --   --  1.4*  --  2.8* 2.1  --   PHOS 3.3  --  1.4*  --  1.6* 2.1* 2.7  --   --   --  3.6 2.7  --    < > = values in this interval not displayed.   GFR: Estimated Creatinine Clearance: 49 mL/min (A) (by C-G formula based on SCr of 1.66 mg/dL (H)). Recent Labs  Lab 09/20/2020 1543 10/05/2020 1552 10/01/20 0419 10/01/20 0832 10/01/20 1130  10/01/20 1341 10/01/20 1624 10/01/20 1706 10/01/20 1741 10/02/20 0459  PROCALCITON 31.99  --   --   --   --   --   --   --   --   --   WBC  --    < > 18.8*  --   --   --  18.5* 17.3*  --  16.4*  LATICACIDVEN  --    < >  --    < > 6.8* 6.8*  --   --  7.3* 5.9*   < > = values in this interval not displayed.    Liver Function Tests: Recent Labs  Lab 10/03/2020 2155 09/30/20 0933 09/30/20 1436 09/30/20 1954 10/01/20 0419  AST 51* 46* 41 39 64*  ALT 22 18 17 18 21   ALKPHOS 122 107 94 98 85  BILITOT 0.9 0.8 0.8 1.1 1.0  PROT 3.8* 3.6* 3.8* 3.5* 3.8*  ALBUMIN 1.7* 1.5* 1.8* 1.5* 1.7*   Recent Labs  Lab 10/12/2020 1543  LIPASE 63*  AMYLASE 36   Recent Labs  Lab 10/01/20 1306  AMMONIA 101*    ABG    Component Value Date/Time   PHART 7.401 10/02/2020 0837   PCO2ART 32.0 10/02/2020 0837   PO2ART 52 (L) 10/02/2020 0837   HCO3 19.8 (L) 10/02/2020 0837   TCO2 21 (L) 10/02/2020 0837   ACIDBASEDEF 4.0 (H) 10/02/2020 0837   O2SAT 86.0 10/02/2020 0837     Coagulation Profile: Recent Labs  Lab 09/30/20 2014 10/01/20 0600 10/01/20 1624 10/01/20 1706 10/02/20 0459  INR 3.6* 2.0* 1.6* 1.6* 1.5*  Cardiac Enzymes: Recent Labs  Lab 10/01/20 1706 10/02/20 0459  CKTOTAL 665* 465*    HbA1C: No results found for: HGBA1C  CBG: Recent Labs  Lab 10/01/20 1540 10/01/20 1910 10/01/20 2309 10/02/20 0310 10/02/20 0705  GLUCAP 87 93 90 116* 90    Total critical care time: 42 minutes   Critical care time was exclusive of separately billable procedures and treating other patients.   Critical care was necessary to treat or prevent imminent or life-threatening deterioration.   Critical care was time spent personally by me on the following activities: development of treatment plan with patient and/or surrogate as well as nursing, discussions with consultants, evaluation of patient's response to treatment, examination of patient, obtaining history from patient or  surrogate, ordering and performing treatments and interventions, ordering and review of laboratory studies, ordering and review of radiographic studies, pulse oximetry and re-evaluation of patient's condition.   Hayden Pedro, AGACNP-BC  Pulmonary & Critical Care

## 2020-10-02 NOTE — Progress Notes (Signed)
Pharmacy Electrolyte Replacement  Recent Labs:  Recent Labs    10/02/20 0459  K 2.9*  MG 2.1  PHOS 2.7  CREATININE 1.66*    Low Critical Values (K </= 2.5, Phos </= 1, Mg </= 1) Present: K = 2.9  MD Contacted: N/A  Plan: replaced per protocol  Rebbeca Paul, PharmD PGY1 Pharmacy Resident 10/02/2020 8:05 AM  Please check [email or web address] for unit-specific pharmacy phone numbers.

## 2020-10-02 NOTE — Progress Notes (Signed)
eLink Physician-Brief Progress Note Patient Name: Dominic Phillips DOB: 1960-10-07 MRN: [id number]   Date of Service  10/02/2020  HPI/Events of Note  Platelets now 70, previously 27 Update from bedside RN is that mental status has been this way for the past few days with previous CT negative for acute findings  eICU Interventions  Transfused 1 apharesed platelets. Will hold off head CT for now     Intervention Category Intermediate Interventions: Thrombocytopenia - evaluation and management  Judd Lien 10/02/2020, 6:04 AM

## 2020-10-02 NOTE — Progress Notes (Signed)
eLink Physician-Brief Progress Note Patient Name: Dominic Phillips DOB: 1961/06/05 MRN: [id number]   Date of Service  10/02/2020  HPI/Events of Note  Notified of RASS -4 from -3. Not on continuous sedation but did receive several doses of Fentanyl  Moves all extremities on bedside assessment. Pupils reportedly equally reactive  eICU Interventions  Discussed with bedside RN to hold off on Fentanyl pushes Will await this mornings labs and if platelets significantly lower may warrant a head CT to rule out bleed     Intervention Category Intermediate Interventions: Change in mental status - evaluation and management  Judd Lien 10/02/2020, 4:32 AM

## 2020-10-03 DIAGNOSIS — E43 Unspecified severe protein-calorie malnutrition: Secondary | ICD-10-CM | POA: Diagnosis not present

## 2020-10-03 DIAGNOSIS — R579 Shock, unspecified: Secondary | ICD-10-CM | POA: Diagnosis not present

## 2020-10-03 LAB — PREPARE PLATELET PHERESIS: Unit division: 0

## 2020-10-03 LAB — BASIC METABOLIC PANEL
Anion gap: 13 (ref 5–15)
Anion gap: 15 (ref 5–15)
BUN: 31 mg/dL — ABNORMAL HIGH (ref 6–20)
BUN: 34 mg/dL — ABNORMAL HIGH (ref 6–20)
CO2: 19 mmol/L — ABNORMAL LOW (ref 22–32)
CO2: 18 mmol/L — ABNORMAL LOW (ref 22–32)
Calcium: 6.7 mg/dL — ABNORMAL LOW (ref 8.9–10.3)
Calcium: 7 mg/dL — ABNORMAL LOW (ref 8.9–10.3)
Chloride: 97 mmol/L — ABNORMAL LOW (ref 98–111)
Chloride: 99 mmol/L (ref 98–111)
Creatinine, Ser: 1.52 mg/dL — ABNORMAL HIGH (ref 0.61–1.24)
Creatinine, Ser: 1.5 mg/dL — ABNORMAL HIGH (ref 0.61–1.24)
GFR, Estimated: 53 mL/min — ABNORMAL LOW (ref >60)
GFR, Estimated: 54 mL/min — ABNORMAL LOW (ref >60)
Glucose, Bld: 155 mg/dL — ABNORMAL HIGH (ref 70–99)
Glucose, Bld: 124 mg/dL — ABNORMAL HIGH (ref 70–99)
Potassium: 3.4 mmol/L — ABNORMAL LOW (ref 3.5–5.1)
Potassium: 3.7 mmol/L (ref 3.5–5.1)
Sodium: 129 mmol/L — ABNORMAL LOW (ref 135–145)
Sodium: 132 mmol/L — ABNORMAL LOW (ref 135–145)

## 2020-10-03 LAB — DIC (DISSEMINATED INTRAVASCULAR COAGULATION)PANEL
D-Dimer, Quant: 3.88 ug/mL-FEU — ABNORMAL HIGH (ref 0.00–0.50)
Fibrinogen: 289 mg/dL (ref 210–475)
INR: 1.3 — ABNORMAL HIGH (ref 0.8–1.2)
Platelets: 15 10*3/uL — CL (ref 150–400)
Prothrombin Time: 16.1 seconds — ABNORMAL HIGH (ref 11.4–15.2)
Smear Review: NONE SEEN
aPTT: 34 seconds (ref 24–36)

## 2020-10-03 LAB — BPAM PLATELET PHERESIS
Blood Product Expiration Date: 202204202359
ISSUE DATE / TIME: 202204190858
Unit Type and Rh: 7300

## 2020-10-03 LAB — CBC
HCT: 18.7 % — ABNORMAL LOW (ref 39.0–52.0)
Hemoglobin: 6.6 g/dL — CL (ref 13.0–17.0)
MCH: 32.5 pg (ref 26.0–34.0)
MCHC: 35.3 g/dL (ref 30.0–36.0)
MCV: 92.1 fL (ref 80.0–100.0)
Platelets: 17 10*3/uL — CL (ref 150–400)
RBC: 2.03 MIL/uL — ABNORMAL LOW (ref 4.22–5.81)
RDW: 23.3 % — ABNORMAL HIGH (ref 11.5–15.5)
WBC: 17.5 10*3/uL — ABNORMAL HIGH (ref 4.0–10.5)
nRBC: 0.1 % (ref 0.0–0.2)

## 2020-10-03 LAB — GLUCOSE, CAPILLARY
Glucose-Capillary: 156 mg/dL — ABNORMAL HIGH (ref 70–99)
Glucose-Capillary: 144 mg/dL — ABNORMAL HIGH (ref 70–99)
Glucose-Capillary: 132 mg/dL — ABNORMAL HIGH (ref 70–99)
Glucose-Capillary: 123 mg/dL — ABNORMAL HIGH (ref 70–99)
Glucose-Capillary: 123 mg/dL — ABNORMAL HIGH (ref 70–99)
Glucose-Capillary: 142 mg/dL — ABNORMAL HIGH (ref 70–99)

## 2020-10-03 LAB — PREPARE RBC (CROSSMATCH)

## 2020-10-03 LAB — LACTIC ACID, PLASMA: Lactic Acid, Venous: 3.9 mmol/L (ref 0.5–1.9)

## 2020-10-03 LAB — HEPARIN INDUCED PLATELET AB (HIT ANTIBODY): Heparin Induced Plt Ab: 0.098 OD (ref 0.000–0.400)

## 2020-10-03 LAB — MAGNESIUM: Magnesium: 2.1 mg/dL (ref 1.7–2.4)

## 2020-10-03 LAB — PHOSPHORUS: Phosphorus: 2.6 mg/dL (ref 2.5–4.6)

## 2020-10-03 LAB — H. PYLORI ANTIGEN, STOOL: H. Pylori Stool Ag, Eia: NEGATIVE

## 2020-10-03 MED ORDER — FUROSEMIDE 10 MG/ML IJ SOLN
80.0000 mg | Freq: Once | INTRAMUSCULAR | Status: AC
  Administered 2020-10-03: 80 mg via INTRAVENOUS
  Filled 2020-10-03: qty 8

## 2020-10-03 MED ORDER — FUROSEMIDE 10 MG/ML IJ SOLN
60.0000 mg | Freq: Once | INTRAMUSCULAR | Status: AC
  Administered 2020-10-03: 60 mg via INTRAVENOUS
  Filled 2020-10-03: qty 6

## 2020-10-03 MED ORDER — POTASSIUM CHLORIDE 20 MEQ PO PACK
40.0000 meq | PACK | Freq: Once | ORAL | Status: AC
  Administered 2020-10-03: 40 meq
  Filled 2020-10-03: qty 2

## 2020-10-03 MED ORDER — POTASSIUM CHLORIDE 20 MEQ PO PACK: 40.0000 meq | PACK | Freq: Once | ORAL | Status: DC

## 2020-10-03 MED ORDER — DOCUSATE SODIUM 50 MG/5ML PO LIQD: 100.0000 mg | Freq: Two times a day (BID) | ORAL | Status: DC | PRN

## 2020-10-03 MED ORDER — POTASSIUM CHLORIDE 20 MEQ PO PACK
20.0000 meq | PACK | Freq: Once | ORAL | Status: AC
  Administered 2020-10-03: 20 meq via ORAL
  Filled 2020-10-03: qty 1

## 2020-10-03 MED ORDER — SODIUM CHLORIDE 0.9% IV SOLUTION: Freq: Once | INTRAVENOUS | Status: AC

## 2020-10-03 NOTE — Progress Notes (Signed)
eLink Physician-Brief Progress Note Patient Name: Dominic Phillips DOB: 06-Feb-1961 MRN: [id number]   Date of Service  10/03/2020  HPI/Events of Note  Notified of Hgb 6.6 from 7.5, platelets 17 No brisk bleed but with oozing on IV sites Still on norepinephrine able to titrate down  eICU Interventions  Ordered to transfuse 1 unit PRBC and 1 apharesed platelets     Intervention Category Intermediate Interventions: Coagulopathy - evaluation and management;Bleeding - evaluation and treatment with blood products  Judd Lien 10/03/2020, 4:57 AM

## 2020-10-03 NOTE — Progress Notes (Signed)
NAME:  Dominic Phillips, MRN:  [id number], DOB:  02-16-1961, LOS: 4 ADMISSION DATE:  10/06/2020, CONSULTATION DATE: 09/24/2020 REFERRING MD:  Quintella Reichert, CHIEF COMPLAINT: Altered mental status  History of Present Illness:  60 year old male with history significant for ETOH use, fall 10 days prior to admission, presented 4/16 to ED with encephalopathy, generalized weakness and found to be in shock with severe electrolyte imbalance.   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 4/16 admitted . 4/17 ETT . 4/18 INR 3.7, given 2 units of FFP.developed bruising to scrotum and left thigh   Interim History / Subjective:  + 1.6 L I/O, net positive 20 L this admission. T Max 101.3. Hgb 6.6 this am and received 1 unit PRBC, pending 1 unit of plt. Vasopressors improving, down to 13 mcg/min.   Objective   Blood pressure 129/80, pulse (!) 118, temperature 98.5 F (36.9 C), temperature source Axillary, resp. rate (!) 33, height 5\' 9"  (1.753 m), weight 71.5 kg, SpO2 92 %.    Vent Mode: PRVC FiO2 (%):  [50 %-60 %] 50 % Set Rate:  [20 bmp] 20 bmp Vt Set:  [560 mL] 560 mL PEEP:  [5 cmH20] 5 cmH20 Plateau Pressure:  [21 cmH20-29 cmH20] 24 cmH20   Intake/Output Summary (Last 24 hours) at 10/03/2020 0830 Last data filed at 10/03/2020 0600 Gross per 24 hour  Intake 2940.42 ml  Output 1345 ml  Net 1595.42 ml   Filed Weights   09/30/20 0342 10/02/20 0338 10/03/20 0422  Weight: 56.2 kg 70.5 kg 71.5 kg    Examination: Physical exam:  General: Chronically ill male, intubated and on no sedation  HEENT: AT/Craig, NG and ETT in place, sclera edema   Neuro:  Chest: Crackles noted, no secretions via ETT.   Heart: Tachy, no MRG Abdomen: Soft, bowel sounds present,  Colostomy in place with dark output  Skin: Anasarca. Bruising noted to left lower flank and left lateral thigh with open blisters, scrotum with swelling and bruising, pitting edema noted to all extremities     Resolved Hospital Problem list   A. fib with RVR/nonsustained V. tach due to severe electrolyte imbalance Hypoglycemia, resolved with tube feeds  Assessment & Plan:   Septic and hypovolemic shock from aspiration pneumonia and severe dehydration Acute gastroenteritis infectious versus inflammatory -ECHO 4/17 with EF 50-55 -Strep P + -C.Diff Negative  Plan -Titrate Vasopressors for MAP goal >65. Continue to wean Levo and vaso to maintain MAP > 65.   -Stress dose steroids started 4/19, continue -Continue Rocephin.   Partial Small Bowel Obstruction  -CT A/P with Partial small bowel obstruction with point of transition involving single loop of bowel herniated within a right paraumbilical hernia. Proximal small bowel appears mildly distended and fluid-filled H/O colectomy with endcolostomy due to diverticulitis with perforation 2018  Plan - Colostomy with brown output.   Scrotal Swelling  Plan -Patient is volume overloaded within the tissues. U/S Scrotum with trace bilateral hydrocele, left greater then right, if no improvement will consult urology  -Elevate scrotum, attempt diuresis for volume removal  Acute kidney injury due to severe dehydration High anion gap metabolic acidosis Plan - Creatinine 3.3 on presentation, downtrending - Replace Potassium this am  Acute hypoxic respiratory failure due to aspiration pneumonia Tobacco dependence Plan -Continue Vent Support with  lung protective strategies.   Acute hepatic encephalopathy Cirrhosis  -CTH 4/17:No acute intracranial pathology -lactulose and rifaxamin  Alcohol abuse Plan -Continue Thiamine, folic acid and multivitamin   Coffee-ground emesis due to upper GI bleeding from possible Mallory-Weiss -Occult blood  via stool + Plan -Continue BID PPI  Coagulopathy secondary to sepsis, liver dysfunction Plan  -Goal Hgb > 7 and plt > 20  Hyponatremia -Multifactorial with chronic ETOH use and volume overload -No indication for acute reversal -Diuresis today  Fever -1 episode of temp 101.3 overnight with stable leukocytosis.  Improving vasopressors, will trend and work up if re-current  Skin breakdown -left lateral thigh with ruptured blisters, dressing intact.   Best practice (right click and "Reselect all SmartList Selections" daily)  Diet:  NPO, tolerating tube feeds Pain/Anxiety/Delirium protocol (if indicated): No VAP protocol (if indicated): Not indicated DVT prophylaxis: SCD due to thrombocytopenia GI prophylaxis: PPI Glucose control:  SSI No Central venous access:  Yes, and it is still needed Arterial line:  Yes, and it is still needed Foley:  Yes, and it is still needed Mobility:  bed rest  PT consulted: N/A Last date of multidisciplinary goals of care discussion:  patient's  patient made DNR, continue aggressive medical care but if fails then proceed with comfort care.  Code Status: DNR Disposition: ICU  Labs   CBC: Recent Labs  Lab 09/15/2020 1400 10/13/2020 1408 09/30/2020 1958 10/01/2020 2155 09/30/20 0230 09/30/20 1954 09/30/20 2014 10/01/20 0419 10/01/20 0848 10/01/20 1624 10/01/20 1706 10/02/20 0459 10/02/20 0837 10/03/20 0352  WBC 16.9*  --  10.4 7.8   < > 17.5*  --  18.8*  --  18.5* 17.3* 16.4*  --  17.5*  NEUTROABS 14.0*  --  2.5 7.4  --  15.6*  --   --   --   --   --   --   --   --   HGB 9.0*   < > 7.3* 7.7*   < > 7.6*  --  7.0*   < > 7.2* 7.1* 7.3* 7.5* 6.6*  HCT RESULTS UNAVAILABLE DUE TO INTERFERING SUBSTANCE   < > 20.7* 20.9*   < > RESULTS UNAVAILABLE DUE TO INTERFERING SUBSTANCE  --  RESULTS UNAVAILABLE DUE TO INTERFERING SUBSTANCE   < > 19.6* 20.1* 19.6* 22.0* 18.7*  MCV RESULTS UNAVAILABLE DUE TO INTERFERING SUBSTANCE  --  100.0 97.7   < > RESULTS  UNAVAILABLE DUE TO INTERFERING SUBSTANCE  --  RESULTS UNAVAILABLE DUE TO INTERFERING SUBSTANCE  --  89.5 90.1 88.3  --  92.1  PLT 149*  --  99* 100*   < > 52*   < > 28*  --  28* 27* 12*  13*  --  15*  17*   < > = values in this interval not displayed.    Basic Metabolic Panel: Recent Labs  Lab 10/01/20 0419 10/01/20 0848 10/01/20 1000 10/01/20 1306 10/01/20 1354 10/02/20 0459 10/02/20 0837 10/02/20 2226 10/03/20 0352  NA 130*   < >  --  128*  --  132* 131* 128* 129*  K 2.8*   < >  --  3.5  --  2.9* 3.2* 3.6 3.4*  CL 98  --   --  97*  --  97*  --  97* 97*  CO2 14*  --   --  14*  --  20*  --  19* 19*  GLUCOSE 137*  --   --  87  --  122*  --  151* 155*  BUN 36*  --   --  34*  --  31*  --  30* 31*  CREATININE 2.14*  --   --  1.88*  --  1.66*  --  1.55* 1.52*  CALCIUM 6.1*  --   --  6.4*  --  6.4*  --  6.6* 6.7*  MG  --   --  1.4*  --  2.8* 2.1  --  2.0 2.1  PHOS 2.7  --   --   --  3.6 2.7  --  2.8 2.6   < > = values in this interval not displayed.   GFR: Estimated Creatinine Clearance: 53.6 mL/min (A) (by C-G formula based on SCr of 1.52 mg/dL (H)). Recent Labs  Lab 10/09/2020 1543 09/27/2020 1552 10/01/20 1624 10/01/20 1706 10/01/20 1741 10/02/20 0459 10/02/20 1428 10/02/20 2156 10/03/20 0352 10/03/20 0357  PROCALCITON 31.99  --   --   --   --   --   --   --   --   --   WBC  --    < > 18.5* 17.3*  --  16.4*  --   --  17.5*  --   LATICACIDVEN  --    < >  --   --    < > 5.9* 5.4* 4.2*  --  3.9*   < > = values in this interval not displayed.    Liver Function Tests: Recent Labs  Lab 09/30/20 0933 09/30/20 1436 09/30/20 1954 10/01/20 0419 10/02/20 1506  AST 46* 41 39 64* 43*  ALT 18 17 18 21 22   ALKPHOS 107 94 98 85 73  BILITOT 0.8 0.8 1.1 1.0 1.5*  PROT 3.6* 3.8* 3.5* 3.8* 4.1*  ALBUMIN 1.5* 1.8* 1.5* 1.7* 2.2*   Recent Labs  Lab 09/19/2020 1543  LIPASE 63*  AMYLASE 36   Recent Labs  Lab 10/01/20 1306  AMMONIA 101*    ABG    Component Value  Date/Time   PHART 7.401 10/02/2020 0837   PCO2ART 32.0 10/02/2020 0837   PO2ART 52 (L) 10/02/2020 0837   HCO3 19.8 (L) 10/02/2020 0837   TCO2 21 (L) 10/02/2020 0837   ACIDBASEDEF 4.0 (H) 10/02/2020 0837   O2SAT 86.0 10/02/2020 0837     Coagulation Profile: Recent Labs  Lab 10/01/20 0600 10/01/20 1624 10/01/20 1706 10/02/20 0459 10/03/20 0352  INR 2.0* 1.6* 1.6* 1.5* 1.3*    Cardiac Enzymes: Recent Labs  Lab 10/01/20 1706 10/02/20 0459 10/02/20 1506  CKTOTAL 665* 465* 441*    HbA1C: No results found for: HGBA1C  CBG: Recent Labs  Lab 10/02/20 1620 10/02/20 1920 10/02/20 2311 10/03/20 0327 10/03/20 0717  GLUCAP 135* 131* 159* 156* 144*    Total critical care time: 40 minutes   Critical care time was exclusive of separately billable procedures and treating other patients.   Critical care was necessary to treat or prevent imminent or life-threatening deterioration.   Critical care was time spent personally by me on the following activities: development of treatment plan with patient and/or surrogate as well as nursing, discussions with consultants, evaluation of patient's response to treatment, examination of patient, obtaining history from patient or surrogate, ordering and performing treatments and interventions, ordering and review of laboratory studies, ordering and review of radiographic studies, pulse oximetry and re-evaluation of patient's condition.  Paulita Fujita, ACNP [facility]

## 2020-10-04 ENCOUNTER — Inpatient Hospital Stay (HOSPITAL_COMMUNITY): Payer: Medicaid Other

## 2020-10-04 DIAGNOSIS — R579 Shock, unspecified: Secondary | ICD-10-CM | POA: Diagnosis not present

## 2020-10-04 LAB — BASIC METABOLIC PANEL
Anion gap: 13 (ref 5–15)
Anion gap: 11 (ref 5–15)
BUN: 42 mg/dL — ABNORMAL HIGH (ref 6–20)
BUN: 47 mg/dL — ABNORMAL HIGH (ref 6–20)
CO2: 21 mmol/L — ABNORMAL LOW (ref 22–32)
CO2: 25 mmol/L (ref 22–32)
Calcium: 7.3 mg/dL — ABNORMAL LOW (ref 8.9–10.3)
Calcium: 7.4 mg/dL — ABNORMAL LOW (ref 8.9–10.3)
Chloride: 102 mmol/L (ref 98–111)
Chloride: 100 mmol/L (ref 98–111)
Creatinine, Ser: 1.51 mg/dL — ABNORMAL HIGH (ref 0.61–1.24)
Creatinine, Ser: 1.45 mg/dL — ABNORMAL HIGH (ref 0.61–1.24)
GFR, Estimated: 54 mL/min — ABNORMAL LOW (ref >60)
GFR, Estimated: 56 mL/min — ABNORMAL LOW (ref >60)
Glucose, Bld: 148 mg/dL — ABNORMAL HIGH (ref 70–99)
Glucose, Bld: 148 mg/dL — ABNORMAL HIGH (ref 70–99)
Potassium: 2.8 mmol/L — ABNORMAL LOW (ref 3.5–5.1)
Potassium: 2.6 mmol/L — CL (ref 3.5–5.1)
Sodium: 136 mmol/L (ref 135–145)
Sodium: 136 mmol/L (ref 135–145)

## 2020-10-04 LAB — GLUCOSE, CAPILLARY
Glucose-Capillary: 146 mg/dL — ABNORMAL HIGH (ref 70–99)
Glucose-Capillary: 134 mg/dL — ABNORMAL HIGH (ref 70–99)
Glucose-Capillary: 140 mg/dL — ABNORMAL HIGH (ref 70–99)
Glucose-Capillary: 148 mg/dL — ABNORMAL HIGH (ref 70–99)
Glucose-Capillary: 157 mg/dL — ABNORMAL HIGH (ref 70–99)

## 2020-10-04 LAB — PREPARE PLATELET PHERESIS: Unit division: 0

## 2020-10-04 LAB — TYPE AND SCREEN
ABO/RH(D): B POS
Antibody Screen: NEGATIVE
Unit division: 0
Unit division: 0

## 2020-10-04 LAB — BPAM PLATELET PHERESIS
Blood Product Expiration Date: 202204212359
ISSUE DATE / TIME: 202204200923
Unit Type and Rh: 6200

## 2020-10-04 LAB — CBC WITH DIFFERENTIAL/PLATELET
Abs Immature Granulocytes: 0.41 10*3/uL — ABNORMAL HIGH (ref 0.00–0.07)
Basophils Absolute: 0 10*3/uL (ref 0.0–0.1)
Basophils Relative: 0 %
Eosinophils Absolute: 0 10*3/uL (ref 0.0–0.5)
Eosinophils Relative: 0 %
HCT: 22.5 % — ABNORMAL LOW (ref 39.0–52.0)
Hemoglobin: 8.1 g/dL — ABNORMAL LOW (ref 13.0–17.0)
Immature Granulocytes: 3 %
Lymphocytes Relative: 6 %
Lymphs Abs: 0.9 10*3/uL (ref 0.7–4.0)
MCH: 32.3 pg (ref 26.0–34.0)
MCHC: 36 g/dL (ref 30.0–36.0)
MCV: 89.6 fL (ref 80.0–100.0)
Monocytes Absolute: 0.4 10*3/uL (ref 0.1–1.0)
Monocytes Relative: 3 %
Neutro Abs: 13.3 10*3/uL — ABNORMAL HIGH (ref 1.7–7.7)
Neutrophils Relative %: 88 %
Platelets: 48 10*3/uL — ABNORMAL LOW (ref 150–400)
RBC: 2.51 MIL/uL — ABNORMAL LOW (ref 4.22–5.81)
RDW: 20.8 % — ABNORMAL HIGH (ref 11.5–15.5)
WBC: 15.1 10*3/uL — ABNORMAL HIGH (ref 4.0–10.5)
nRBC: 0.7 % — ABNORMAL HIGH (ref 0.0–0.2)

## 2020-10-04 LAB — CULTURE, BLOOD (ROUTINE X 2)
Culture: NO GROWTH
Culture: NO GROWTH

## 2020-10-04 LAB — BPAM RBC
Blood Product Expiration Date: 202205052359
Blood Product Expiration Date: 202205102359
ISSUE DATE / TIME: 202204181450
ISSUE DATE / TIME: 202204200555
Unit Type and Rh: 7300
Unit Type and Rh: 7300

## 2020-10-04 LAB — LACTIC ACID, PLASMA: Lactic Acid, Venous: 1.9 mmol/L (ref 0.5–1.9)

## 2020-10-04 LAB — CULTURE, BLOOD (SINGLE): Culture: NO GROWTH

## 2020-10-04 MED ORDER — POTASSIUM CHLORIDE 20 MEQ PO PACK
40.0000 meq | PACK | Freq: Every day | ORAL | Status: DC
  Administered 2020-10-04 – 2020-10-07 (×4): 40 meq
  Filled 2020-10-04 (×4): qty 2

## 2020-10-04 MED ORDER — PANTOPRAZOLE SODIUM 40 MG PO PACK
40.0000 mg | PACK | Freq: Two times a day (BID) | ORAL | Status: DC
  Administered 2020-10-04 – 2020-10-07 (×7): 40 mg
  Filled 2020-10-04 (×8): qty 20

## 2020-10-04 MED ORDER — POTASSIUM CHLORIDE 10 MEQ/100ML IV SOLN
10.0000 meq | INTRAVENOUS | Status: AC
  Administered 2020-10-04 (×4): 10 meq via INTRAVENOUS
  Filled 2020-10-04: qty 100

## 2020-10-04 MED ORDER — POTASSIUM CHLORIDE 10 MEQ/50ML IV SOLN
10.0000 meq | INTRAVENOUS | Status: DC
  Filled 2020-10-04: qty 50

## 2020-10-04 MED ORDER — SODIUM CHLORIDE 0.9 % IV SOLN: INTRAVENOUS | Status: DC | PRN

## 2020-10-04 MED ORDER — ALBUMIN HUMAN 5 % IV SOLN
12.5000 g | Freq: Once | INTRAVENOUS | Status: AC
  Administered 2020-10-04: 12.5 g via INTRAVENOUS
  Filled 2020-10-04: qty 250

## 2020-10-04 MED ORDER — POTASSIUM CHLORIDE 20 MEQ PO PACK
40.0000 meq | PACK | Freq: Once | ORAL | Status: AC
  Administered 2020-10-04: 40 meq
  Filled 2020-10-04: qty 2

## 2020-10-04 MED ORDER — FUROSEMIDE 10 MG/ML IJ SOLN
80.0000 mg | Freq: Three times a day (TID) | INTRAMUSCULAR | Status: AC
  Administered 2020-10-04 – 2020-10-05 (×3): 80 mg via INTRAVENOUS
  Filled 2020-10-04 (×3): qty 8

## 2020-10-04 MED ORDER — MAGNESIUM SULFATE 2 GM/50ML IV SOLN
2.0000 g | Freq: Once | INTRAVENOUS | Status: AC
  Administered 2020-10-04: 2 g via INTRAVENOUS
  Filled 2020-10-04: qty 50

## 2020-10-04 NOTE — Progress Notes (Signed)
Pharmacy Electrolyte Replacement  Recent Labs:  Recent Labs    10/03/20 0352 10/03/20 1600 10/04/20 0500  K 3.4*   < > 2.8*  MG 2.1  --   --   PHOS 2.6  --   --   CREATININE 1.52*   < > 1.51*   < > = values in this interval not displayed.    Low Critical Values (K </= 2.5, Phos </= 1, Mg </= 1) Present: N/A  MD Contacted: N/A  Plan: Replaced per protocol  Rebbeca Paul, PharmD PGY1 Pharmacy Resident 10/04/2020 6:41 AM  Please check [email or web address] for unit-specific pharmacy phone numbers.

## 2020-10-04 NOTE — Consult Note (Addendum)
WOC Nurse Consult Note: Reason for Consult:Consulted for hematoma to abdomen, left thigh and scrotum.  Coagulopathy and transfusion of FFP. Wound type: Coagulopathy  inflammation Pressure Injury POA: NA Measurement:generalized ecchymosis, hematoma and blistering to left flank, lower abdomen, left thigh and hip.  Scrotum is edematous and ruborous.   Wound bed:red and moist Drainage (amount, consistency, odor)moderate weeping, serosanguinous  No odor.   Periwound:bruising, edema Dressing procedure/placement/frequency: Cleanse blisters and weeping wounds to left flank, abdomen and left thigh/hip with NS.  Apply Xeroform gauze  (LAWSON # [id number])- large sheets- and cover with ABD pad and tape or silicone foam dressing.  Change each shift.   Scrotum wrapped with Interdry pillowcase to wick moisture, change each shift.  [facility] Nurse ostomy consult note Stoma type/location: LLQ colostomy  Stomal assessment/size: 1 " red and moist Peristomal assessment: intact   Treatment options for stomal/peristomal skin: barrier ring and 1 piece convex pouch Output soft brown stool Ostomy pouching: 1pc.convex with barrier ring (LAWsON # [id number]) Education provided: None Enrolled patient in [place] program: NO Will not follow at this time.  Please re-consult if needed.  Domenic Moras MSN, RN, FNP-BC [facility]ON Wound, Ostomy, Continence Nurse Pager [phone]   Conservative sharp wound debridement (CSWD performed at the bedside):

## 2020-10-04 NOTE — Progress Notes (Signed)
Ventilator pt transported from 2M08 to CT and back without any complications. Pt placed back on full vent support.

## 2020-10-04 NOTE — Progress Notes (Signed)
Nutrition Follow-up  DOCUMENTATION CODES:   Severe malnutrition in context of chronic illness,Underweight  INTERVENTION:   Continue tube feeding via NG tube: Vital AF 1.2 increasing by 10 ml every 12 hours to goal rate of 70 ml/h (1680 ml per day)  Provides 2016 kcal, 126 gm protein, 1362 ml free water daily.  Continue to monitor magnesium, potassium, and phosphorus. MD to replete as needed, as pt is at risk for refeeding syndrome given severe malnutrition, low K and mag, hx alcoholism.  NUTRITION DIAGNOSIS:   Severe Malnutrition related to chronic illness (COPD, alcohol abuse) as evidenced by severe muscle depletion,severe fat depletion.  Ongoing  GOAL:   Patient will meet greater than or equal to 90% of their needs   Progressing with TF advancement  MONITOR:   TF tolerance,Vent status,Labs,Skin,I & O's  REASON FOR ASSESSMENT:   Ventilator,Consult Enteral/tube feeding initiation and management  ASSESSMENT:   60 yo male admitted with encephalopathy, sepsis. PMH includes COPD, colectomy, colostomy, alcoholism.   Discussed patient in ICU rounds and with RN today. Patient tolerating TF well. Currently at 60 ml/h. Held a few hours this morning due to a feeding pump malfunction. Almost at goal rate of 70 ml/h. Mag and phos were WNL yesterday. K is low again today, receiving IV and enteral repletion.   Patient remains intubated on ventilator support MV: 11.5 L/min Temp (24hrs), Avg:98.5 F (36.9 C), Min:96.8 F (36 C), Max:99.5 F (37.5 C)   Labs reviewed. K 2.8, Phos and mag WNL 4/20 CBG: 146-134  Medications reviewed and include folic acid, Lasix, lactulose, liquid MVI, KCl (enteral and IV), thiamine, Levophed.  I/O +21.9 L since admission UOP 2,225 ml x 24 h Stool OP 685 ml x 24 h (colostomy)   Diet Order:   Diet Order            Diet NPO time specified  Diet effective now                 EDUCATION NEEDS:   Not appropriate for education at this  time  Skin:  Skin Assessment: Skin Integrity Issues: Skin Integrity Issues:: Stage I,Incisions Stage I: sacrum Incisions: R chest  Last BM:  4/20 colostomy  Height:   Ht Readings from Last 1 Encounters:  09/30/20 5\' 9"  (1.753 m)    Weight:   Wt Readings from Last 1 Encounters:  10/04/20 69.1 kg    Ideal Body Weight:  72.7 kg  BMI:  Body mass index is 22.5 kg/m.  Estimated Nutritional Needs:   Kcal:  1980  Protein:  100-115 gm  Fluid:  >/= 2 L    Lucas Mallow, RD, LDN, CNSC Please refer to Amion for contact information.

## 2020-10-04 NOTE — Progress Notes (Addendum)
NAME:  Dominic Phillips, MRN:  [id number], DOB:  1961/01/02, LOS: 5 ADMISSION DATE:  09/15/2020, CONSULTATION DATE: 10/06/2020 REFERRING MD:  Quintella Reichert, CHIEF COMPLAINT: Altered mental status  History of Present Illness:  60 year old male with history significant for ETOH use, fall 10 days prior to admission, presented 4/16 to ED with encephalopathy, generalized weakness and found to be in shock with severe electrolyte imbalance.   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 4/16 admitted . 4/17 ETT . 4/18 INR 3.7, given 2 units of FFP.developed bruising to scrotum and left thigh   Interim History / Subjective:  + 1.2 L I/O, 2.2 L UOP with diuresis. Afebrile. Off sedation without interaction  Objective   Blood pressure 118/61, pulse 73, temperature 98.3 F (36.8 C), temperature source Axillary, resp. rate (!) 27, height 5\' 9"  (1.753 m), weight 69.1 kg, SpO2 96 %.    Vent Mode: PSV FiO2 (%):  [40 %-50 %] 40 % Set Rate:  [20 bmp] 20 bmp Vt Set:  [560 mL] 560 mL PEEP:  [5 cmH20] 5 cmH20 Pressure Support:  [10 cmH20] 10 cmH20 Plateau Pressure:  [21 cmH20-26 cmH20] 21 cmH20   Intake/Output Summary (Last 24 hours) at 10/04/2020 1761 Last data filed at 10/04/2020 6073 Gross per 24 hour  Intake 4059.88 ml  Output 2785 ml  Net 1274.88 ml   Filed Weights   10/02/20 0338 10/03/20 0422 10/04/20 0500  Weight: 70.5 kg 71.5 kg 69.1 kg    Examination: Physical exam:  General: Chronically ill male, intubated and on no sedation  HEENT: AT/Bee, NG and ETT in place, sclera edema   Neuro:  Chest: Crackles noted, no secretions via ETT.   Heart: Tachy, no MRG Abdomen: Soft, bowel sounds present, Colostomy in place with dark output  Skin: Anasarca. Bruising  noted to left lower flank and left lateral thigh with open blisters, scrotum with swelling and bruising, pitting edema noted to all extremities     Resolved Hospital Problem list   A. fib with RVR/nonsustained V. tach due to severe electrolyte imbalance Hypoglycemia, resolved with tube feeds  Assessment & Plan:   Septic and hypovolemic shock from aspiration pneumonia and severe dehydration Acute gastroenteritis infectious versus inflammatory -ECHO 4/17 with EF 50-55 -Strep P + -C.Diff Negative  Plan -Titrate Vasopressors for MAP goal >65. Continue to wean Levo and vaso to maintain MAP > 65.   -Stress dose steroids started 4/19, discontinue today. If patient requires increase vasopressor requirements will restart.  -Continue Rocephin, 7 day course.   Partial Small Bowel Obstruction  -CT A/P with Partial small bowel obstruction with point of transition involving single loop of bowel herniated within a right paraumbilical hernia. Proximal small bowel appears mildly distended and fluid-filled H/O colectomy with endcolostomy due to diverticulitis with perforation 2018  Plan - Colostomy with brown output.   Scrotal Swelling  Plan -Patient is volume overloaded within the tissues. U/S Scrotum with trace bilateral hydrocele, left greater then right -Elevate scrotum, attempt diuresis for volume removal. Consult wound care  Acute kidney injury due to severe dehydration High anion gap metabolic acidosis Plan - Creatinine 3.3 on presentation, downtrending - Replace Potassium this am  Acute hypoxic respiratory failure due to aspiration pneumonia Tobacco dependence Plan -Continue Vent Support with lung protective strategies.  Acute hepatic encephalopathy Cirrhosis  -CTH 4/17:No acute intracranial pathology -lactulose and rifaxamin  Alcohol abuse Plan -Continue Thiamine, folic acid and multivitamin   Coffee-ground emesis due to upper GI bleeding from possible Mallory-Weiss -Occult  blood via stool + Plan -Continue BID PPI  Coagulopathy secondary to sepsis, liver dysfunction Plan  -Goal Hgb > 7 and plt > 20 -No transfusions required today  Hyponatremia, improving -Multifactorial with chronic ETOH use and volume overload -No indication for acute reversal -Diuresis today  Fever,resolved  Skin breakdown -not present on admission. left lateral thigh with ruptured blisters, crosses upper abdomen. Consult wound care  Best practice (right click and "Reselect all SmartList Selections" daily)  Diet:  NPO, tolerating tube feeds Pain/Anxiety/Delirium protocol (if indicated): No VAP protocol (if indicated): Not indicated DVT prophylaxis: SCD due to thrombocytopenia GI prophylaxis: PPI Glucose control:  SSI No Central venous access:  Yes, and it is still needed Arterial line:  Yes, and it is still needed Foley:  Yes, and it is still needed Mobility:  bed rest  PT consulted: N/A Last date of multidisciplinary goals of care discussion:  patient's  patient made DNR, continue aggressive medical care but if fails then proceed with comfort care. Last updated 4/20 Code Status: DNR Disposition: ICU  Labs   CBC: Recent Labs  Lab 10/11/2020 1400 10/04/2020 1408 09/28/2020 1958 10/10/2020 2155 09/30/20 0230 09/30/20 1954 09/30/20 2014 10/01/20 1624 10/01/20 1706 10/02/20 0459 10/02/20 0837 10/03/20 0352 10/04/20 0500  WBC 16.9*  --  10.4 7.8   < > 17.5*   < > 18.5* 17.3* 16.4*  --  17.5* 15.1*  NEUTROABS 14.0*  --  2.5 7.4  --  15.6*  --   --   --   --   --   --  13.3*  HGB 9.0*   < > 7.3* 7.7*   < > 7.6*   < > 7.2* 7.1* 7.3* 7.5* 6.6* 8.1*  HCT RESULTS UNAVAILABLE DUE TO INTERFERING SUBSTANCE   < > 20.7* 20.9*   < > RESULTS UNAVAILABLE DUE TO INTERFERING SUBSTANCE   < > 19.6* 20.1* 19.6* 22.0* 18.7* 22.5*  MCV RESULTS UNAVAILABLE DUE TO INTERFERING SUBSTANCE  --  100.0 97.7   < > RESULTS UNAVAILABLE DUE TO INTERFERING SUBSTANCE   < > 89.5 90.1 88.3  --  92.1 89.6  PLT  149*  --  99* 100*   < > 52*   < > 28* 27* 12*  13*  --  15*  17* 48*   < > = values in this interval not displayed.    Basic Metabolic Panel: Recent Labs  Lab 10/01/20 0419 10/01/20 0848 10/01/20 1000 10/01/20 1306 10/01/20 1354 10/02/20 0459 10/02/20 0837 10/02/20 2226 10/03/20 0352 10/03/20 1600 10/04/20 0500  NA 130*   < >  --    < >  --  132* 131* 128* 129* 132* 136  K 2.8*   < >  --    < >  --  2.9* 3.2* 3.6 3.4* 3.7 2.8*  CL 98  --   --    < >  --  97*  --  97* 97* 99 102  CO2 14*  --   --    < >  --  20*  --  19* 19* 18* 21*  GLUCOSE 137*  --   --    < >  --  122*  --  151* 155* 124* 148*  BUN 36*  --   --    < >  --  31*  --  30* 31* 34* 42*  CREATININE 2.14*  --   --    < >  --  1.66*  --  1.55* 1.52* 1.50* 1.51*  CALCIUM 6.1*  --   --    < >  --  6.4*  --  6.6* 6.7* 7.0* 7.3*  MG  --   --  1.4*  --  2.8* 2.1  --  2.0 2.1  --   --   PHOS 2.7  --   --   --  3.6 2.7  --  2.8 2.6  --   --    < > = values in this interval not displayed.   GFR: Estimated Creatinine Clearance: 52.8 mL/min (A) (by C-G formula based on SCr of 1.51 mg/dL (H)). Recent Labs  Lab 09/24/2020 1543 10/02/2020 1552 10/01/20 1706 10/01/20 1741 10/02/20 0459 10/02/20 1428 10/02/20 2156 10/03/20 0352 10/03/20 0357 10/04/20 0500  PROCALCITON 31.99  --   --   --   --   --   --   --   --   --   WBC  --    < > 17.3*  --  16.4*  --   --  17.5*  --  15.1*  LATICACIDVEN  --    < >  --    < > 5.9* 5.4* 4.2*  --  3.9* 1.9   < > = values in this interval not displayed.    Liver Function Tests: Recent Labs  Lab 09/30/20 0933 09/30/20 1436 09/30/20 1954 10/01/20 0419 10/02/20 1506  AST 46* 41 39 64* 43*  ALT 18 17 18 21 22   ALKPHOS 107 94 98 85 73  BILITOT 0.8 0.8 1.1 1.0 1.5*  PROT 3.6* 3.8* 3.5* 3.8* 4.1*  ALBUMIN 1.5* 1.8* 1.5* 1.7* 2.2*   Recent Labs  Lab 09/14/2020 1543  LIPASE 63*  AMYLASE 36   Recent Labs  Lab 10/01/20 1306  AMMONIA 101*    ABG    Component Value Date/Time    PHART 7.401 10/02/2020 0837   PCO2ART 32.0 10/02/2020 0837   PO2ART 52 (L) 10/02/2020 0837   HCO3 19.8 (L) 10/02/2020 0837   TCO2 21 (L) 10/02/2020 0837   ACIDBASEDEF 4.0 (H) 10/02/2020 0837   O2SAT 86.0 10/02/2020 0837     Coagulation Profile: Recent Labs  Lab 10/01/20 0600 10/01/20 1624 10/01/20 1706 10/02/20 0459 10/03/20 0352  INR 2.0* 1.6* 1.6* 1.5* 1.3*    Cardiac Enzymes: Recent Labs  Lab 10/01/20 1706 10/02/20 0459 10/02/20 1506  CKTOTAL 665* 465* 441*    HbA1C: No results found for: HGBA1C  CBG: Recent Labs  Lab 10/03/20 1507 10/03/20 1917 10/03/20 2307 10/04/20 0306 10/04/20 0715  GLUCAP 123* 123* 142* 146* 134*    Total critical care time: 41 minutes   Critical care time was exclusive of separately billable procedures and treating other patients.   Critical care was necessary to treat or prevent imminent or life-threatening deterioration.   Critical care was time spent personally by me on the following activities: development of treatment plan with patient and/or surrogate as well as nursing, discussions with consultants, evaluation of patient's response to treatment, examination of patient, obtaining history from patient or surrogate, ordering and performing treatments and interventions, ordering and review of laboratory studies, ordering and review of radiographic studies, pulse oximetry and re-evaluation of patient's condition.  Paulita Fujita, ACNP [facility]

## 2020-10-05 DIAGNOSIS — E43 Unspecified severe protein-calorie malnutrition: Secondary | ICD-10-CM | POA: Diagnosis not present

## 2020-10-05 DIAGNOSIS — Z7189 Other specified counseling: Secondary | ICD-10-CM

## 2020-10-05 DIAGNOSIS — Z515 Encounter for palliative care: Secondary | ICD-10-CM

## 2020-10-05 DIAGNOSIS — R579 Shock, unspecified: Secondary | ICD-10-CM | POA: Diagnosis not present

## 2020-10-05 LAB — BASIC METABOLIC PANEL
Anion gap: 12 (ref 5–15)
Anion gap: 10 (ref 5–15)
BUN: 52 mg/dL — ABNORMAL HIGH (ref 6–20)
BUN: 51 mg/dL — ABNORMAL HIGH (ref 6–20)
CO2: 26 mmol/L (ref 22–32)
CO2: 31 mmol/L (ref 22–32)
Calcium: 7.7 mg/dL — ABNORMAL LOW (ref 8.9–10.3)
Calcium: 7.6 mg/dL — ABNORMAL LOW (ref 8.9–10.3)
Chloride: 100 mmol/L (ref 98–111)
Chloride: 101 mmol/L (ref 98–111)
Creatinine, Ser: 1.27 mg/dL — ABNORMAL HIGH (ref 0.61–1.24)
Creatinine, Ser: 1.07 mg/dL (ref 0.61–1.24)
GFR, Estimated: >60 mL/min (ref >60)
GFR, Estimated: >60 mL/min (ref >60)
Glucose, Bld: 168 mg/dL — ABNORMAL HIGH (ref 70–99)
Glucose, Bld: 129 mg/dL — ABNORMAL HIGH (ref 70–99)
Potassium: 2 mmol/L — CL (ref 3.5–5.1)
Potassium: 2.2 mmol/L — CL (ref 3.5–5.1)
Sodium: 138 mmol/L (ref 135–145)
Sodium: 142 mmol/L (ref 135–145)

## 2020-10-05 LAB — CBC
HCT: 22.1 % — ABNORMAL LOW (ref 39.0–52.0)
Hemoglobin: 7.6 g/dL — ABNORMAL LOW (ref 13.0–17.0)
MCH: 31.9 pg (ref 26.0–34.0)
MCHC: 34.4 g/dL (ref 30.0–36.0)
MCV: 92.9 fL (ref 80.0–100.0)
Platelets: 68 10*3/uL — ABNORMAL LOW (ref 150–400)
RBC: 2.38 MIL/uL — ABNORMAL LOW (ref 4.22–5.81)
RDW: 20.9 % — ABNORMAL HIGH (ref 11.5–15.5)
WBC: 13.5 10*3/uL — ABNORMAL HIGH (ref 4.0–10.5)
nRBC: 1.4 % — ABNORMAL HIGH (ref 0.0–0.2)

## 2020-10-05 LAB — PHOSPHORUS: Phosphorus: 2.3 mg/dL — ABNORMAL LOW (ref 2.5–4.6)

## 2020-10-05 LAB — GLUCOSE, CAPILLARY
Glucose-Capillary: 133 mg/dL — ABNORMAL HIGH (ref 70–99)
Glucose-Capillary: 153 mg/dL — ABNORMAL HIGH (ref 70–99)
Glucose-Capillary: 161 mg/dL — ABNORMAL HIGH (ref 70–99)
Glucose-Capillary: 168 mg/dL — ABNORMAL HIGH (ref 70–99)
Glucose-Capillary: 191 mg/dL — ABNORMAL HIGH (ref 70–99)
Glucose-Capillary: 226 mg/dL — ABNORMAL HIGH (ref 70–99)

## 2020-10-05 LAB — STOOL CULTURE REFLEX - RSASHR

## 2020-10-05 LAB — STOOL CULTURE: E coli, Shiga toxin Assay: NEGATIVE

## 2020-10-05 LAB — MAGNESIUM
Magnesium: 2 mg/dL (ref 1.7–2.4)
Magnesium: 1.7 mg/dL (ref 1.7–2.4)

## 2020-10-05 LAB — STOOL CULTURE REFLEX - CMPCXR

## 2020-10-05 MED ORDER — POTASSIUM CHLORIDE 20 MEQ PO PACK
40.0000 meq | PACK | Freq: Once | ORAL | Status: AC
  Administered 2020-10-05: 40 meq
  Filled 2020-10-05: qty 2

## 2020-10-05 MED ORDER — ALBUMIN HUMAN 5 % IV SOLN
12.5000 g | Freq: Once | INTRAVENOUS | Status: AC
  Administered 2020-10-05: 12.5 g via INTRAVENOUS
  Filled 2020-10-05: qty 250

## 2020-10-05 MED ORDER — AMIODARONE IV BOLUS ONLY 150 MG/100ML
150.0000 mg | Freq: Once | INTRAVENOUS | Status: AC
  Administered 2020-10-05: 150 mg via INTRAVENOUS
  Filled 2020-10-05: qty 100

## 2020-10-05 MED ORDER — FENTANYL CITRATE (PF) 100 MCG/2ML IJ SOLN
50.0000 ug | Freq: Once | INTRAMUSCULAR | Status: AC
Start: 2020-10-05 — End: 2020-10-05
  Administered 2020-10-05: 50 ug via INTRAVENOUS

## 2020-10-05 MED ORDER — FENTANYL 2500MCG IN NS 250ML (10MCG/ML) PREMIX INFUSION
0.0000 ug/h | INTRAVENOUS | Status: DC
  Administered 2020-10-05: 50 ug/h via INTRAVENOUS
  Administered 2020-10-06: 100 ug/h via INTRAVENOUS
  Filled 2020-10-05 (×2): qty 250

## 2020-10-05 MED ORDER — POTASSIUM CHLORIDE 10 MEQ/50ML IV SOLN
10.0000 meq | INTRAVENOUS | Status: AC
  Administered 2020-10-05 (×6): 10 meq via INTRAVENOUS
  Filled 2020-10-05 (×6): qty 50

## 2020-10-05 MED ORDER — FENTANYL BOLUS VIA INFUSION
50.0000 ug | INTRAVENOUS | Status: DC | PRN
Start: 2020-10-05 — End: 2020-10-08
  Administered 2020-10-07: 50 ug via INTRAVENOUS
  Filled 2020-10-05: qty 100

## 2020-10-05 MED ORDER — POTASSIUM CHLORIDE 10 MEQ/50ML IV SOLN
10.0000 meq | INTRAVENOUS | Status: AC
  Administered 2020-10-05 – 2020-10-06 (×6): 10 meq via INTRAVENOUS
  Filled 2020-10-05 (×6): qty 50

## 2020-10-05 MED ORDER — POTASSIUM PHOSPHATES 15 MMOLE/5ML IV SOLN
20.0000 mmol | Freq: Once | INTRAVENOUS | Status: DC
  Filled 2020-10-05: qty 6.67

## 2020-10-05 MED ORDER — PNEUMOCOCCAL VAC POLYVALENT 25 MCG/0.5ML IJ INJ
0.5000 mL | INJECTION | INTRAMUSCULAR | Status: DC | PRN
  Filled 2020-10-05: qty 0.5

## 2020-10-05 MED ORDER — POTASSIUM PHOSPHATES 15 MMOLE/5ML IV SOLN
30.0000 mmol | Freq: Once | INTRAVENOUS | Status: AC
  Administered 2020-10-05: 30 mmol via INTRAVENOUS
  Filled 2020-10-05: qty 10

## 2020-10-05 MED ORDER — POTASSIUM CHLORIDE 20 MEQ PO PACK: 40.0000 meq | PACK | Freq: Once | ORAL | Status: DC

## 2020-10-05 MED ORDER — MAGNESIUM SULFATE 2 GM/50ML IV SOLN
2.0000 g | Freq: Once | INTRAVENOUS | Status: AC
  Administered 2020-10-05: 2 g via INTRAVENOUS
  Filled 2020-10-05: qty 50

## 2020-10-05 MED ORDER — FENTANYL CITRATE (PF) 100 MCG/2ML IJ SOLN
25.0000 ug | INTRAMUSCULAR | Status: DC | PRN
  Administered 2020-10-05 – 2020-10-06 (×2): 50 ug via INTRAVENOUS
  Filled 2020-10-05: qty 2

## 2020-10-05 MED ORDER — ACETAMINOPHEN 160 MG/5ML PO SOLN
650.0000 mg | Freq: Four times a day (QID) | ORAL | Status: DC | PRN
  Administered 2020-10-05 – 2020-10-07 (×4): 650 mg
  Filled 2020-10-05 (×4): qty 20.3

## 2020-10-05 NOTE — Progress Notes (Signed)
NAME:  Dominic Phillips, MRN:  [id number], DOB:  [birth year], LOS: 6 ADMISSION DATE:  10/04/2020, CONSULTATION DATE: 09/25/2020 REFERRING MD:  Quintella Reichert, CHIEF COMPLAINT: Altered mental status  History of Present Illness:  60 year old male with history significant for ETOH use, fall 10 days prior to admission, presented 4/16 to ED with encephalopathy, generalized weakness and found to be in shock with severe electrolyte imbalance.   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 4/16 admitted . 4/17 ETT . 4/18 INR 3.7, given 2 units of FFP.developed bruising to scrotum and left thigh   Interim History / Subjective:  - 5 L with diuresis, remains + 16 L this admission. T Max 100.7. CTH without acute intracranial process.   Objective   Blood pressure (!) 103/58, pulse 88, temperature 100.3 F (37.9 C), temperature source Oral, resp. rate 20, height 5\' 9"  (1.753 m), weight 62.2 kg, SpO2 100 %.    Vent Mode: PSV FiO2 (%):  [40 %] 40 % Set Rate:  [20 bmp] 20 bmp Vt Set:  [560 mL] 560 mL PEEP:  [5 cmH20] 5 cmH20 Pressure Support:  [10 cmH20] 10 cmH20 Plateau Pressure:  [16 cmH20] 16 cmH20   Intake/Output Summary (Last 24 hours) at 10/05/2020 0724 Last data filed at 10/05/2020 0700 Gross per 24 hour  Intake 2888.22 ml  Output 8265 ml  Net -5376.78 ml   Filed Weights   10/03/20 0422 10/04/20 0500 10/05/20 0500  Weight: 71.5 kg 69.1 kg 62.2 kg    Examination: Physical exam:  General: Chronically ill male, intubated and on no sedation  HEENT: AT/, NG and ETT in place, sclera edema   Neuro:  Chest: Crackles noted, no secretions via ETT.   Heart: Tachy, no MRG Abdomen: Soft, bowel sounds present, Colostomy in place with dark output  Skin: Anasarca.  Bruising noted to left lower flank and left lateral thigh with open blisters, scrotum with swelling and bruising, pitting edema noted to all extremities     Resolved Hospital Problem list   A. fib with RVR/nonsustained V. tach due to severe electrolyte imbalance Hypoglycemia, resolved with tube feeds  Assessment & Plan:   Septic and hypovolemic shock from aspiration pneumonia and severe dehydration Acute gastroenteritis infectious versus inflammatory -ECHO 4/17 with EF 50-55 -Strep P + -C.Diff Negative  Plan -Titrate Vasopressors for MAP goal >65. Increase in vasopressors from 5 mcg/min to 10 mcg/min this am, may be related to diuresis and negative fluid balance of 5 L > resuscitate with albumin. Replacing electrolytes. If continues to have increasing requirements will consider restarting stress dose steroids.   -Stress dose steroids started 4/19-4/21 -Continue Rocephin, 7 day course.   Partial Small Bowel Obstruction  -CT A/P with Partial small bowel obstruction with point of transition involving single loop of bowel herniated within a right paraumbilical hernia. Proximal small bowel appears mildly distended and fluid-filled H/O colectomy with endcolostomy due to diverticulitis with perforation 2018  Plan - Colostomy with brown output, abdominal exam benign. No clinical signs of obstruction.   Anasarca Scrotal edema Plan -Patient is volume overloaded within the tissues. U/S Scrotum with trace bilateral hydrocele, left greater then right -Elevate scrotum -Wound care following  Acute kidney injury due to severe dehydration High anion gap metabolic acidosis Plan - Creatinine 3.3 on presentation, downtrending  with diuresis - Continue electrolyte replacement  Acute hypoxic respiratory failure due to aspiration pneumonia Tobacco dependence Plan -Continue Vent Support with lung protective strategies. Neurological exam prevents extubation and ability to protect airway  Acute  hepatic encephalopathy Cirrhosis  -[facility] 4/17 & 4/21:No acute intracranial pathology -lactulose and rifaxamin -Mild improvement in exam today, patient will open eyes and turn toward voice.   Alcohol abuse Plan -Continue Thiamine, folic acid and multivitamin   Coffee-ground emesis due to upper GI bleeding from possible Mallory-Weiss -Occult blood via stool + Plan -Continue BID PPI  Coagulopathy secondary to sepsis, liver dysfunction Plan  -Goal Hgb > 7 and plt > 20 -No transfusions required today  Hyponatremia, improving -Multifactorial with chronic ETOH use and volume overload  Fever,resolved  Skin breakdown -not present on admission. left lateral thigh with ruptured blisters, crosses upper abdomen. Wound care following  Best practice (right click and "Reselect all SmartList Selections" daily)  Diet:  NPO, tolerating tube feeds Pain/Anxiety/Delirium protocol (if indicated): No VAP protocol (if indicated): Not indicated DVT prophylaxis: SCD due to thrombocytopenia. Consider resuming tomorrow if continues to have plt recovery  GI prophylaxis: PPI Glucose control:  SSI No Central venous access:  Yes, and it is still needed Arterial line:  Yes, and it is still needed Foley:  Yes, and it is still needed Mobility:  bed rest  PT consulted: N/A Last date of multidisciplinary goals of care discussion:  patient's  patient made DNR, continue aggressive medical care but if fails then proceed with comfort care. Last updated 4/21. Palliative care consult for expectation and long term goals of care.  Code Status: DNR Disposition: ICU  Labs   CBC: Recent Labs  Lab 2020/10/21 1400 10/21/2020 1408 10/21/20 1958 21-Oct-2020 2155 09/30/20 0230 09/30/20 1954 09/30/20 2014 10/01/20 1706 10/02/20 0459 10/02/20 0837 10/03/20 0352 10/04/20 0500 10/05/20 0400  WBC 16.9*  --  10.4 7.8   < > 17.5*   < > 17.3* 16.4*  --  17.5* 15.1* 13.5*  NEUTROABS 14.0*  --  2.5 7.4  --  15.6*  --   --   --    --   --  13.3*  --   HGB 9.0*   < > 7.3* 7.7*   < > 7.6*   < > 7.1* 7.3* 7.5* 6.6* 8.1* 7.6*  HCT RESULTS UNAVAILABLE DUE TO INTERFERING SUBSTANCE   < > 20.7* 20.9*   < > RESULTS UNAVAILABLE DUE TO INTERFERING SUBSTANCE   < > 20.1* 19.6* 22.0* 18.7* 22.5* 22.1*  MCV RESULTS UNAVAILABLE DUE TO INTERFERING SUBSTANCE  --  100.0 97.7   < > RESULTS UNAVAILABLE DUE TO INTERFERING SUBSTANCE   < > 90.1 88.3  --  92.1 89.6 92.9  PLT 149*  --  99* 100*   < > 52*   < > 27* 12*  13*  --  15*  17* 48* 68*   < > = values in this interval not displayed.    Basic Metabolic Panel: Recent Labs  Lab 10/01/20 1000 10/01/20 1306 10/01/20 1354 10/02/20 0459 10/02/20 0837 10/02/20 2226 10/03/20 0352 10/03/20 1600 10/04/20 0500 10/04/20 1600 10/05/20 0400  NA  --    < >  --  132*   < > 128* 129* 132* 136 136 138  K  --    < >  --  2.9*   < > 3.6 3.4* 3.7 2.8* 2.6* 2.0*  CL  --    < >  --  97*  --  97* 97* 99 102 100 100  CO2  --    < >  --  20*  --  19* 19* 18* 21* 25 26  GLUCOSE  --    < >  --  122*  --  151* 155* 124* 148* 148* 168*  BUN  --    < >  --  31*  --  30* 31* 34* 42* 47* 52*  CREATININE  --    < >  --  1.66*  --  1.55* 1.52* 1.50* 1.51* 1.45* 1.27*  CALCIUM  --    < >  --  6.4*  --  6.6* 6.7* 7.0* 7.3* 7.4* 7.7*  MG 1.4*  --  2.8* 2.1  --  2.0 2.1  --   --   --   --   PHOS  --   --  3.6 2.7  --  2.8 2.6  --   --   --  2.3*   < > = values in this interval not displayed.   GFR: Estimated Creatinine Clearance: 56.5 mL/min (A) (by C-G formula based on SCr of 1.27 mg/dL (H)). Recent Labs  Lab 10/09/2020 1543 09/22/2020 1552 10/02/20 0459 10/02/20 1428 10/02/20 2156 10/03/20 0352 10/03/20 0357 10/04/20 0500 10/05/20 0400  PROCALCITON 31.99  --   --   --   --   --   --   --   --   WBC  --    < > 16.4*  --   --  17.5*  --  15.1* 13.5*  LATICACIDVEN  --    < > 5.9* 5.4* 4.2*  --  3.9* 1.9  --    < > = values in this interval not displayed.    Liver Function Tests: Recent Labs  Lab  09/30/20 0933 09/30/20 1436 09/30/20 1954 10/01/20 0419 10/02/20 1506  AST 46* 41 39 64* 43*  ALT 18 17 18 21 22   ALKPHOS 107 94 98 85 73  BILITOT 0.8 0.8 1.1 1.0 1.5*  PROT 3.6* 3.8* 3.5* 3.8* 4.1*  ALBUMIN 1.5* 1.8* 1.5* 1.7* 2.2*   Recent Labs  Lab 09/27/2020 1543  LIPASE 63*  AMYLASE 36   Recent Labs  Lab 10/01/20 1306  AMMONIA 101*    ABG    Component Value Date/Time   PHART 7.401 10/02/2020 0837   PCO2ART 32.0 10/02/2020 0837   PO2ART 52 (L) 10/02/2020 0837   HCO3 19.8 (L) 10/02/2020 0837   TCO2 21 (L) 10/02/2020 0837   ACIDBASEDEF 4.0 (H) 10/02/2020 0837   O2SAT 86.0 10/02/2020 0837     Coagulation Profile: Recent Labs  Lab 10/01/20 0600 10/01/20 1624 10/01/20 1706 10/02/20 0459 10/03/20 0352  INR 2.0* 1.6* 1.6* 1.5* 1.3*    Cardiac Enzymes: Recent Labs  Lab 10/01/20 1706 10/02/20 0459 10/02/20 1506  CKTOTAL 665* 465* 441*    HbA1C: No results found for: HGBA1C  CBG: Recent Labs  Lab 10/04/20 0715 10/04/20 1059 10/04/20 1547 10/04/20 2000 10/05/20 0026  GLUCAP 134* 140* 148* 157* 153*    Total critical care time: 39 minutes   Critical care time was exclusive of separately billable procedures and treating other patients.   Critical care was necessary to treat or prevent imminent or life-threatening deterioration.   Critical care was time spent personally by me on the following activities: development of treatment plan with patient and/or surrogate as well as nursing, discussions with consultants, evaluation of patient's response to treatment, examination of patient, obtaining history from  patient or surrogate, ordering and performing treatments and interventions, ordering and review of laboratory studies, ordering and review of radiographic studies, pulse oximetry and re-evaluation of patient's condition.  Paulita Fujita, ACNP [facility]  Please use secure chat for coordination of care.

## 2020-10-05 NOTE — Progress Notes (Signed)
Date and time results received: 10/05/20 5:27 PM (use smartphrase ".now" to insert current time)  Test: Potassium blood Critical Value: K+ 2.2  Name of Provider Notified: Dr. Silas Flood  Orders Received? Or Actions Taken?: Orders Received - See Orders for details

## 2020-10-05 NOTE — Consult Note (Addendum)
Palliative Medicine Inpatient Consult Note  Reason for consult:  Discuss goals of care  HPI:  Per intake H&P --> 60 year old male with history significant for ETOH use, fall 10 days prior to admission, presented 4/16 to ED with encephalopathy, generalized weakness and found to be in shock with severe electrolyte imbalance. Dominic Phillips now with multi-system organ failure, FTT --> d/t ongoing with N/V, diarrhea.  Palliative care was asked to get involved to further discuss goals of care given patient poor prognosis. Dominic Phillips was seen by the PMT in 2018   Clinical Assessment/Goals of Care:  *Please note that this is a verbal dictation therefore any spelling or grammatical errors are due to the "Moody One" system interpretation.  I have reviewed medical records including EPIC notes, labs and imaging, received report from bedside RN, assessed the patient who is lying in bed intubated.  Does respond by opening eyes and turning head towards me when name is called, is able to track me.   I called patient's daughter, Dominic Phillips to further discuss diagnosis prognosis, GOC, EOL wishes, disposition and options.   I introduced Palliative Medicine as specialized medical care for people living with serious illness. It focuses on providing relief from the symptoms and stress of a serious illness. The goal is to improve quality of life for both the patient and the family.  Dominic Phillips shares with me that her father presently lives in [place], [place].  Dominic Phillips is single as his wife left him many years ago.  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  He is a man of faith and practices within Christianity.  Dominic habits include chronic alcohol abuse disorder and frequent marijuana smoking.  Prior to  admission to the hospital Dominic Phillips had been living alone in his home with his son Dominic Phillips who he has been the primary caregiver for due to his son's disabilities.  This has been throughout the course of Dominic Phillips's life.  I asked Dominic Phillips which she understands about Dominic Phillips medical conditions at the present time.  She shares with me that she knows he has been "very sick" for quite some time.  She expresses that for the most part he had been consuming beer throughout his life though as of recently he had been drinking hard liquor.  She feels that this certainly contributed to his poor clinical state.  Reviewed that Dominic Phillips has endured great stress to his body in the setting of septic shock and that multiple organ systems are in failure inclusive of his respiratory system whereby he is on full mechanical ventilation support.  Reviewed that his gastrointestinal system is in dysfunction in the setting of obstruction requiring nasogastric tube to suction.  Reviewed that his coagulation is impaired secondary to shock.  We further discussed that right now he is critically ill and has a very guarded prognosis.  A detailed discussion was had today regarding advanced directives -Dominic Phillips had never completed these though being that Dominic Phillips is his oldest daughter she by default is his Media planner.   Concepts specific to code status, artifical feeding and hydration, continued IV antibiotics and rehospitalization was had.    Patient is do not attempt resuscitation.  He is presently intubated.  Time she was very tearful on the phone with me this morning and expressed  that she plans to come into the hospital later today.  She vocalizes that she did not realize her father's health was in such poor condition.  I shared with Dominic Phillips that at this time is reasonable to maintain hope but we must be realistic about the possible outcomes of Dominic Phillips hospitalization.  Discussed the importance of continued conversation with family and their  medical  providers regarding overall plan of care and treatment options, ensuring decisions are within the context of the patients values and GOCs.  Decision Maker: Dominic Phillips (daughter) [phone]  SUMMARY OF RECOMMENDATIONS   DNAR - patient is presently intubated.  Patient's daughter, Dominic Phillips plans to come in today to have further palliative care conversations. She is aware that patient is critically ill with a guarded prognosis. I shared that moving forward we will need to come to some potentially very difficult decisions.   Ongoing palliative care support in the oncoming days.  Code Status/Advance Care Planning: DNAR    Palliative Prophylaxis:   Oral care, turn every 2 hours, delirium precautions, aspiration precautions, pain management  Additional Recommendations (Limitations, Scope, Preferences):  Treat what is treatable   Psycho-social/Spiritual:   Desire for further Chaplaincy support:  Yes - Patient is a [place]  Additional Recommendations:  Provided patient's family education on chronic disease conditions and comorbidities   Prognosis:  Patient's prognosis is poor in the setting of encephalopathy, multisystem organ dysfunction, septic shock.  Discharge Planning:  Discharge plan is unclear  Vitals:   10/05/20 0615 10/05/20 0630  BP:    Pulse: 98 97  Resp: 20 20  Temp:    SpO2: 100% 100%    Intake/Output Summary (Last 24 hours) at 10/05/2020 0641 Last data filed at 10/05/2020 0600 Gross per 24 hour  Intake 2796.76 ml  Output 6675 ml  Net -3878.24 ml   Last Weight  Most recent update: 10/05/2020  5:19 AM   Weight  62.2 kg (137 lb 2 oz)           Gen:  Very ill appearing M HEENT: Dry mucous membranes, ETT tube and coretrack in place CV: Irregular rate and rhythm  PULM:  ON ventilator ABD: soft/nontender  EXT: RUE (+) ecchymosis, LLE (+) petechia Neuro: Alert to move eyes to track moves head to voice  PPS: 10%   This conversation/these recommendations  were discussed with patient primary care team, Dr. Silas Flood  Time In: 0900 Time Out: 1010 Total Time: 54 Greater than 50%  of this time was spent counseling and coordinating care related to the above assessment and plan. ______________________________________________________ Addendum:  Family meeting held this afternoon with patient's 3 sisters and niece.  We reviewed Nechemia's current health conditions inclusive of septic shock, encephalopathy, small bowel obstruction, thrombocytopenia, colitis, and severe protein calorie malnutrition.  We reviewed that patient is a chronic alcohol abuser.  Per patient's family they have seen him in a very poor clinical state before in 2018 though he is much weaker now than he was then and him surviving that episode of sepsis was deemed as a miracle according to his family.  Reviewed that he is in a very tenuous health state and there is great concern that he could decline rather rapidly.  Reviewed that in the oncoming days it may be necessary to change our focus if Lamichael continues to decline.  Delon's family expressed 1 important person he is and that he is at the center of his family.  Reviewed that he is lived a hard life with  his wife having left him and his chronic alcohol abuse disorder.  We discussed that his son, Dominic Phillips remains to be his main focus and reason for living.  After Dominic Phillips hospitalization in 2018 he was no longer able to care for [facility] in the same way which per discussion with his sister is caused him a tremendous amount of emotional distress.  Plan for now is to wait a few days to see if patient can make improvements.  At this point in time we are pursuing watchful waiting.  If patient lacks an improvements then further conversations circulating around comfort care will take place.  Time In: 1330 Time Out: 1400 Total Time: 30 Greater than 50%  of this time was spent counseling and coordinating care related to the above assessment and  plan.  [facility] Team Team Cell Phone: [phone] Please utilize secure chat with additional questions, if there is no response within 30 minutes please call the above phone number  Palliative Medicine Team providers are available by phone from 7am to 7pm daily and can be reached through the team cell phone.  Should this patient require assistance outside of these hours, please call the patient's attending physician.

## 2020-10-05 NOTE — Progress Notes (Signed)
Pt is back on full vent support due to increased HR and decreased BP.

## 2020-10-05 NOTE — Progress Notes (Signed)
This chaplain responded to PMT consult for spiritual care.  This chaplain learned the Pt. sisters and niece  are visiting at the bedside.  The Pt. sisters positively recognized the medical teams efforts to calm the Pt. after the Pt. became excited upon their arrival.  The chaplain understands the Pt. heart is at the center of every story the family shared with the chaplain. The Pt. role of caregiver for his son-[name] is included in the love.    Music older country and bluegrass is the Pt. favorite. The Pt. plays the guitar and is looking forward to playing the banjo.  Music brings multiple generations of joy to the Pt. family celebrations.  The chaplain accepted the family's request for prayer and continued spiritual care.

## 2020-10-05 NOTE — Progress Notes (Incomplete)
Date and time results received: 10/05/20 04:51  Test: BMP Critical Value: K 2.0  Name of Provider Notified: Emmit Alexanders, MD  Orders Received? K replaced

## 2020-10-05 NOTE — Progress Notes (Signed)
eLink Physician-Brief Progress Note Patient Name: Dominic Phillips DOB: 1960/11/28 MRN: [id number]   Date of Service  10/05/2020  HPI/Events of Note  Hypophosphatemia  Hypokalemia - K+ = 2.0, PO4--- = 2.3.  Creatinine = 1.27.  eICU Interventions  Will replace K+ and PO4---.     Intervention Category Major Interventions: Electrolyte abnormality - evaluation and management  [name] 10/05/2020, 5:06 AM

## 2020-10-05 NOTE — Progress Notes (Signed)
eLink Physician-Brief Progress Note Patient Name: Dominic Phillips DOB: [birth year] MRN: [id number]   Date of Service  10/05/2020  HPI/Events of Note  Fever to 102.9 F - AST very mildly elevated at 43.   eICU Interventions  Plan: 1. Tylenol liquid 650 mg per tube Q 6 hours PRN Temp > 101.0 F. 2. Trend AST and ALT.     Intervention Category Major Interventions: Other:  Lysle Dingwall 10/05/2020, 8:07 PM

## 2020-10-05 NOTE — Progress Notes (Signed)
Tube holder changed without any complications. Assisted by RN.

## 2020-10-06 DIAGNOSIS — L89220 Pressure ulcer of left hip, unstageable: Secondary | ICD-10-CM

## 2020-10-06 DIAGNOSIS — R579 Shock, unspecified: Secondary | ICD-10-CM | POA: Diagnosis not present

## 2020-10-06 DIAGNOSIS — E43 Unspecified severe protein-calorie malnutrition: Secondary | ICD-10-CM | POA: Diagnosis not present

## 2020-10-06 LAB — BASIC METABOLIC PANEL
Anion gap: 10 (ref 5–15)
Anion gap: 9 (ref 5–15)
BUN: 49 mg/dL — ABNORMAL HIGH (ref 6–20)
BUN: 43 mg/dL — ABNORMAL HIGH (ref 6–20)
CO2: 30 mmol/L (ref 22–32)
CO2: 28 mmol/L (ref 22–32)
Calcium: 7.4 mg/dL — ABNORMAL LOW (ref 8.9–10.3)
Calcium: 7.6 mg/dL — ABNORMAL LOW (ref 8.9–10.3)
Chloride: 105 mmol/L (ref 98–111)
Chloride: 110 mmol/L (ref 98–111)
Creatinine, Ser: 0.91 mg/dL (ref 0.61–1.24)
Creatinine, Ser: 0.76 mg/dL (ref 0.61–1.24)
GFR, Estimated: >60 mL/min (ref >60)
GFR, Estimated: >60 mL/min (ref >60)
Glucose, Bld: 161 mg/dL — ABNORMAL HIGH (ref 70–99)
Glucose, Bld: 155 mg/dL — ABNORMAL HIGH (ref 70–99)
Potassium: 2.6 mmol/L — CL (ref 3.5–5.1)
Potassium: 4.4 mmol/L (ref 3.5–5.1)
Sodium: 145 mmol/L (ref 135–145)
Sodium: 147 mmol/L — ABNORMAL HIGH (ref 135–145)

## 2020-10-06 LAB — GLUCOSE, CAPILLARY
Glucose-Capillary: 156 mg/dL — ABNORMAL HIGH (ref 70–99)
Glucose-Capillary: 155 mg/dL — ABNORMAL HIGH (ref 70–99)
Glucose-Capillary: 149 mg/dL — ABNORMAL HIGH (ref 70–99)
Glucose-Capillary: 131 mg/dL — ABNORMAL HIGH (ref 70–99)
Glucose-Capillary: 151 mg/dL — ABNORMAL HIGH (ref 70–99)
Glucose-Capillary: 162 mg/dL — ABNORMAL HIGH (ref 70–99)

## 2020-10-06 LAB — CBC
HCT: 23.1 % — ABNORMAL LOW (ref 39.0–52.0)
Hemoglobin: 7.7 g/dL — ABNORMAL LOW (ref 13.0–17.0)
MCH: 32.8 pg (ref 26.0–34.0)
MCHC: 33.3 g/dL (ref 30.0–36.0)
MCV: 98.3 fL (ref 80.0–100.0)
Platelets: 100 10*3/uL — ABNORMAL LOW (ref 150–400)
RBC: 2.35 MIL/uL — ABNORMAL LOW (ref 4.22–5.81)
RDW: 22.1 % — ABNORMAL HIGH (ref 11.5–15.5)
WBC: 16.5 10*3/uL — ABNORMAL HIGH (ref 4.0–10.5)
nRBC: 1.6 % — ABNORMAL HIGH (ref 0.0–0.2)

## 2020-10-06 LAB — PHOSPHORUS: Phosphorus: 2.4 mg/dL — ABNORMAL LOW (ref 2.5–4.6)

## 2020-10-06 LAB — MAGNESIUM: Magnesium: 2.2 mg/dL (ref 1.7–2.4)

## 2020-10-06 MED ORDER — POTASSIUM CHLORIDE 20 MEQ PO PACK
40.0000 meq | PACK | Freq: Once | ORAL | Status: AC
  Administered 2020-10-06: 40 meq
  Filled 2020-10-06: qty 2

## 2020-10-06 MED ORDER — FREE WATER
100.0000 mL | Status: DC
  Administered 2020-10-06 – 2020-10-07 (×5): 100 mL

## 2020-10-06 MED ORDER — POTASSIUM CHLORIDE 10 MEQ/50ML IV SOLN
10.0000 meq | INTRAVENOUS | Status: AC
  Administered 2020-10-06 (×4): 10 meq via INTRAVENOUS
  Filled 2020-10-06 (×4): qty 50

## 2020-10-06 MED ORDER — POTASSIUM CHLORIDE 20 MEQ PO PACK
40.0000 meq | PACK | ORAL | Status: AC
  Administered 2020-10-06 (×2): 40 meq
  Filled 2020-10-06 (×2): qty 2

## 2020-10-06 MED ORDER — POTASSIUM & SODIUM PHOSPHATES 280-160-250 MG PO PACK
1.0000 | PACK | Freq: Three times a day (TID) | ORAL | Status: AC
  Administered 2020-10-06 (×3): 1 via ORAL
  Filled 2020-10-06 (×3): qty 1

## 2020-10-06 MED ORDER — MIDODRINE HCL 5 MG PO TABS
10.0000 mg | ORAL_TABLET | Freq: Three times a day (TID) | ORAL | Status: DC
  Administered 2020-10-06 – 2020-10-07 (×3): 10 mg
  Filled 2020-10-06 (×3): qty 2

## 2020-10-06 MED ORDER — FUROSEMIDE 10 MG/ML IJ SOLN
40.0000 mg | Freq: Once | INTRAMUSCULAR | Status: AC
  Administered 2020-10-06: 40 mg via INTRAVENOUS
  Filled 2020-10-06: qty 4

## 2020-10-06 NOTE — Progress Notes (Signed)
[facility] ADULT ICU REPLACEMENT PROTOCOL   The patient does apply for the [facility] Adult ICU Electrolyte Replacment Protocol based on the criteria listed below:   1. Is GFR >/= 30 ml/min? Yes.    Patient's GFR today is >60 2. Is SCr </= 2? Yes.   Patient's SCr is 0.91 ml/kg/hr 3. Did SCr increase >/= 0.5 in 24 hours? No. 4. Abnormal electrolyte(s): k 2.6 5. Ordered repletion with: protocol per tube and cvc 6. If a panic level lab has been reported, has the CCM MD in charge been notified? No..   Physician:    Ronda Fairly A 10/06/2020 3:20 AM

## 2020-10-06 NOTE — Progress Notes (Signed)
   Palliative Medicine Inpatient Follow Up Note  Reason for consult:  Discuss goals of care  HPI:  Per intake H&P --> 60 year old male with history significant for ETOH use, fall 10 days prior to admission, presented 4/16 to ED with encephalopathy, generalized weakness and found to be in shock with severe electrolyte imbalance. Dominic Phillips now with multi-system organ failure, FTT --> d/t ongoing with N/V, diarrhea.  Palliative care was asked to get involved to further discuss goals of care given patient poor prognosis. Dominic Phillips was seen by the PMT in 2018   Today's Discussion (10/06/2020):  *Please note that this is a verbal dictation therefore any spelling or grammatical errors are due to the "[employer]" system interpretation.  Chart reviewed. (+) fever overnight , (+) low K.   Patient appears alert this morning and is able to perform simple tasks such as squeezing my hand and track me throughout the room.  Per conversation with nursing and respiratory therapy spontaneous breathing trials have been initiated.  I was able to call patient's daughter Dominic Phillips this afternoon and she expressed a lot of hope for the future.  I shared with her that he still in a very guarded health state and that it is still important to consider future steps.  She expresses that she will be present tomorrow in the hospital at which time we will meet.  Goals at this moment are to continue treating Dominic Phillips' multiple active medical conditions and identify if further improvements can be made.  Questions and concerns addressed   Objective Assessment: Vital Signs Vitals:   10/06/20 1100 10/06/20 1122  BP: 107/66   Pulse: 98   Resp: 20   Temp:  97.9 F (36.6 C)  SpO2: 100%     Intake/Output Summary (Last 24 hours) at 10/06/2020 1215 Last data filed at 10/06/2020 5956 Gross per 24 hour  Intake 2908.64 ml  Output 3795 ml  Net -886.36 ml   Last Weight  Most recent update: 10/06/2020  1:12 AM   Weight  57.2 kg  (126 lb 1.7 oz)           Gen:  Very ill appearing M HEENT: Dry mucous membranes, ETT tube and coretrack in place CV: Irregular rate and rhythm  PULM:  On ventilator ABD: soft/nontender  EXT: RUE (+) ecchymosis, LLE (+) petechia Neuro: Alert to move eyes to track moves head to voice  SUMMARY OF RECOMMENDATIONS DNAR - patient is presently intubated.  Continue present measures to improve patient's situation, watchful waiting  Plan to meet with patient's daughter Dominic Phillips tomorrow  Ongoing palliative care support in the oncoming days.  Time Spent: 25 Greater than 50% of the time was spent in counseling and coordination of care ______________________________________________________________________________________ [facility] Team Team Cell Phone: [phone] Please utilize secure chat with additional questions, if there is no response within 30 minutes please call the above phone number  Palliative Medicine Team providers are available by phone from 7am to 7pm daily and can be reached through the team cell phone.  Should this patient require assistance outside of these hours, please call the patient's attending physician.

## 2020-10-06 NOTE — Progress Notes (Signed)
NAME:  Dominic Phillips, MRN:  [id number], DOB:  April 14, 1961, LOS: 7 ADMISSION DATE:  09/24/2020, CONSULTATION DATE: 09/15/2020 REFERRING MD:  Quintella Reichert, CHIEF COMPLAINT: Altered mental status  History of Present Illness:  60 year old male with history significant for ETOH use, fall 10 days prior to admission, presented 4/16 to ED with encephalopathy, generalized weakness and found to be in shock with severe electrolyte imbalance.   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 4/16 admitted . 4/17 ETT . 4/18 INR 3.7, given 2 units of FFP.developed bruising to scrotum and left thigh  4/22 family meeting, based on his values ssupect comfort care would be best course of action, family agrees, would like to see if he can wake up and participate in decision Interim History / Subjective:  Diuresing well, Still volume up. Cr better. Chasing K - wasting via ostomy and with lasix.  Objective   Blood pressure 104/64, pulse 94, temperature (!) 101.5 F (38.6 C), temperature source Oral, resp. rate 20, height 5\' 9"  (1.753 m), weight 57.2 kg, SpO2 100 %.    Vent Mode: PRVC FiO2 (%):  [40 %] 40 % Set Rate:  [20 bmp] 20 bmp Vt Set:  [560 mL] 560 mL PEEP:  [5 cmH20] 5 cmH20 Plateau Pressure:  [16 cmH20-19 cmH20] 16 cmH20   Intake/Output Summary (Last 24 hours) at 10/06/2020 1050 Last data filed at 10/06/2020 4765 Gross per 24 hour  Intake 3140.84 ml  Output 4395 ml  Net -1254.16 ml   Filed Weights   10/05/20 0500 10/06/20 0030 10/06/20 0112  Weight: 62.2 kg 57.2 kg 57.2 kg    Examination: Physical exam:  General: Chronically ill male, intubated and on no sedation  HEENT: AT/[place], NG and ETT in place, sclera edema   Neuro: arouses, attends, imrpoved Chest:  Crackles noted, no secretions via ETT.   Heart: Tachy, no MRG Abdomen: Soft, bowel sounds present, Colostomy in place with dark output  Skin: Anasarca. Bruising noted to left lower flank and left lateral thigh with open blisters, scrotum with swelling and bruising, pitting edema noted to all extremities     Resolved Hospital Problem list   A. fib with RVR/nonsustained V. tach due to severe electrolyte imbalance Hypoglycemia, resolved with tube feeds  Assessment & Plan:   Septic and hypovolemic shock from aspiration pneumonia and severe dehydration Acute gastroenteritis infectious versus inflammatory -ECHO 4/17 with EF 50-55 -Strep P + -C.Diff Negative  Plan -Titrate Vasopressors for MAP goal >65 ,appears weanable per recorded Bps -Add midodrine -Stress dose steroids started 4/19-4/21 -Continue Rocephin, 7 day course.   Partial Small Bowel Obstruction  -CT A/P with Partial small bowel obstruction with point of transition involving single loop of bowel herniated within a right paraumbilical hernia. Proximal small bowel appears mildly distended and fluid-filled H/O colectomy with endcolostomy due to diverticulitis with perforation 2018  Plan - Colostomy with brown output, abdominal exam benign. No clinical signs of obstruction.   Anasarca Scrotal edema Plan -Patient is volume overloaded within the tissues. U/S Scrotum with trace bilateral hydrocele, left greater then right -Elevate scrotum -Wound care following -Diurese  Acute kidney injury due to severe dehydration High anion gap metabolic acidosis Plan - Creatinine 3.3 on presentation, downtrending with diuresis - Continue electrolyte replacement - Diurese  Acute hypoxic respiratory failure  due to aspiration pneumonia Tobacco dependence Plan -Continue Vent Support with lung protective strategies. Neurological exam prevents extubation and ability to protect airway but is imrpoving  Acute hepatic  encephalopathy Cirrhosis  -[facility] 4/17 & 4/21:No acute intracranial pathology -lactulose and rifaxamin -Exam improving.   Alcohol abuse Plan -Continue Thiamine, folic acid and multivitamin   Coffee-ground emesis due to upper GI bleeding from possible Mallory-Weiss -Occult blood via stool + Plan -Continue BID PPI PO  Coagulopathy secondary to sepsis, liver dysfunction Plan  -Goal Hgb > 7 and plt > 20 -No transfusions required today  Hyponatremia, improving -Multifactorial with chronic ETOH use and volume overload  Fever,resolved  Skin breakdown -not present on admission. left lateral thigh with ruptured blisters, crosses upper abdomen. Wound care following  Best practice (right click and "Reselect all SmartList Selections" daily)  Diet:  NPO, tolerating tube feeds Pain/Anxiety/Delirium protocol (if indicated): No VAP protocol (if indicated): Not indicated DVT prophylaxis: SCD due to thrombocytopenia. Consider resuming tomorrow if continues to have plt recovery  GI prophylaxis: PPI Glucose control:  SSI No Central venous access:  Yes, and it is still needed Arterial line:  Yes, and it is still needed Foley:  Yes, and it is still needed Mobility:  bed rest  PT consulted: N/A Last date of multidisciplinary goals of care discussion:  patient's  patient made DNR, continue aggressive medical care but if fails then proceed with comfort care. Last updated 4/22. Would not want trach, likely would want comfort care, family would like to see if mental status imrpoves and if patient can participate in diuscssion  Code Status: DNR Disposition: ICU   Total critical care time: 35 minutes   Critical care time was exclusive of separately billable procedures and treating other patients.   Critical care was necessary to treat or prevent imminent or life-threatening deterioration.   Critical care was time spent personally by me on the following activities: development of treatment plan  with patient and/or surrogate as well as nursing, discussions with consultants, evaluation of patient's response to treatment, examination of patient, obtaining history from patient or surrogate, ordering and performing treatments and interventions, ordering and review of laboratory studies, ordering and review of radiographic studies, pulse oximetry and re-evaluation of patient's condition.  Lanier Clam, MD See Shea Evans for contact info

## 2020-10-07 ENCOUNTER — Inpatient Hospital Stay: Payer: Self-pay

## 2020-10-07 DIAGNOSIS — K7460 Unspecified cirrhosis of liver: Secondary | ICD-10-CM | POA: Diagnosis present

## 2020-10-07 DIAGNOSIS — R579 Shock, unspecified: Secondary | ICD-10-CM | POA: Diagnosis not present

## 2020-10-07 DIAGNOSIS — A419 Sepsis, unspecified organism: Secondary | ICD-10-CM | POA: Diagnosis present

## 2020-10-07 DIAGNOSIS — L89220 Pressure ulcer of left hip, unstageable: Secondary | ICD-10-CM | POA: Diagnosis not present

## 2020-10-07 DIAGNOSIS — E43 Unspecified severe protein-calorie malnutrition: Secondary | ICD-10-CM | POA: Diagnosis not present

## 2020-10-07 DIAGNOSIS — Z66 Do not resuscitate: Secondary | ICD-10-CM

## 2020-10-07 DIAGNOSIS — N179 Acute kidney failure, unspecified: Secondary | ICD-10-CM | POA: Diagnosis present

## 2020-10-07 LAB — CBC
HCT: 24.4 % — ABNORMAL LOW (ref 39.0–52.0)
Hemoglobin: 7.7 g/dL — ABNORMAL LOW (ref 13.0–17.0)
MCH: 32.6 pg (ref 26.0–34.0)
MCHC: 31.6 g/dL (ref 30.0–36.0)
MCV: 103.4 fL — ABNORMAL HIGH (ref 80.0–100.0)
Platelets: 133 10*3/uL — ABNORMAL LOW (ref 150–400)
RBC: 2.36 MIL/uL — ABNORMAL LOW (ref 4.22–5.81)
RDW: 24.3 % — ABNORMAL HIGH (ref 11.5–15.5)
WBC: 15.2 10*3/uL — ABNORMAL HIGH (ref 4.0–10.5)
nRBC: 0.6 % — ABNORMAL HIGH (ref 0.0–0.2)

## 2020-10-07 LAB — BASIC METABOLIC PANEL
Anion gap: 10 (ref 5–15)
BUN: 39 mg/dL — ABNORMAL HIGH (ref 6–20)
CO2: 30 mmol/L (ref 22–32)
Calcium: 7.8 mg/dL — ABNORMAL LOW (ref 8.9–10.3)
Chloride: 110 mmol/L (ref 98–111)
Creatinine, Ser: 0.67 mg/dL (ref 0.61–1.24)
GFR, Estimated: >60 mL/min (ref >60)
Glucose, Bld: 165 mg/dL — ABNORMAL HIGH (ref 70–99)
Potassium: 3.4 mmol/L — ABNORMAL LOW (ref 3.5–5.1)
Sodium: 150 mmol/L — ABNORMAL HIGH (ref 135–145)

## 2020-10-07 LAB — GLUCOSE, CAPILLARY
Glucose-Capillary: 149 mg/dL — ABNORMAL HIGH (ref 70–99)
Glucose-Capillary: 158 mg/dL — ABNORMAL HIGH (ref 70–99)
Glucose-Capillary: 136 mg/dL — ABNORMAL HIGH (ref 70–99)

## 2020-10-07 LAB — PHOSPHORUS: Phosphorus: 2.4 mg/dL — ABNORMAL LOW (ref 2.5–4.6)

## 2020-10-07 LAB — MAGNESIUM: Magnesium: 1.6 mg/dL — ABNORMAL LOW (ref 1.7–2.4)

## 2020-10-07 MED ORDER — MIDAZOLAM 50MG/50ML (1MG/ML) PREMIX INFUSION
0.5000 mg/h | INTRAVENOUS | Status: DC
  Administered 2020-10-07: 0.5 mg/h via INTRAVENOUS
  Filled 2020-10-07: qty 50

## 2020-10-07 MED ORDER — ACETAMINOPHEN 650 MG RE SUPP: 650.0000 mg | Freq: Four times a day (QID) | RECTAL | Status: DC | PRN

## 2020-10-07 MED ORDER — POLYVINYL ALCOHOL 1.4 % OP SOLN: 1.0000 [drp] | Freq: Four times a day (QID) | OPHTHALMIC | Status: DC | PRN

## 2020-10-07 MED ORDER — ONDANSETRON HCL 4 MG/2ML IJ SOLN: 4.0000 mg | Freq: Four times a day (QID) | INTRAMUSCULAR | Status: DC | PRN

## 2020-10-07 MED ORDER — GLYCOPYRROLATE 0.2 MG/ML IJ SOLN: 0.2000 mg | INTRAMUSCULAR | Status: DC | PRN

## 2020-10-07 MED ORDER — GLYCOPYRROLATE 1 MG PO TABS: 1.0000 mg | ORAL_TABLET | ORAL | Status: DC | PRN

## 2020-10-07 MED ORDER — ENOXAPARIN SODIUM 40 MG/0.4ML ~~LOC~~ SOLN
40.0000 mg | SUBCUTANEOUS | Status: DC
  Administered 2020-10-07: 40 mg via SUBCUTANEOUS
  Filled 2020-10-07: qty 0.4

## 2020-10-07 MED ORDER — BIOTENE DRY MOUTH MT LIQD: 15.0000 mL | OROMUCOSAL | Status: DC | PRN

## 2020-10-07 MED ORDER — POTASSIUM CHLORIDE 20 MEQ PO PACK
40.0000 meq | PACK | Freq: Once | ORAL | Status: AC
  Administered 2020-10-07: 40 meq
  Filled 2020-10-07: qty 2

## 2020-10-07 MED ORDER — ONDANSETRON 4 MG PO TBDP: 4.0000 mg | ORAL_TABLET | Freq: Four times a day (QID) | ORAL | Status: DC | PRN

## 2020-10-07 MED ORDER — ACETAMINOPHEN 325 MG PO TABS: 650.0000 mg | ORAL_TABLET | Freq: Four times a day (QID) | ORAL | Status: DC | PRN

## 2020-10-07 MED ORDER — MAGNESIUM SULFATE 4 GM/100ML IV SOLN
4.0000 g | Freq: Once | INTRAVENOUS | Status: AC
  Administered 2020-10-07: 4 g via INTRAVENOUS
  Filled 2020-10-07: qty 100

## 2020-10-07 MED ORDER — FREE WATER
200.0000 mL | Status: DC
  Administered 2020-10-07: 200 mL

## 2020-10-07 MED ORDER — POTASSIUM & SODIUM PHOSPHATES 280-160-250 MG PO PACK
1.0000 | PACK | Freq: Three times a day (TID) | ORAL | Status: DC
  Filled 2020-10-07 (×3): qty 1

## 2020-10-14 NOTE — Progress Notes (Signed)
60 year old pt the family has been told that they are going to transition pt to comfort care. Members of the family told stories about their interaction with the pt. The chaplain offered caring and supportive presence, prayers and blessings. Service of commendation at the bedside. Family grateful for visit.

## 2020-10-14 NOTE — Progress Notes (Signed)
RT attempted SBT with pt this am. Pt placed in CPAP/PS 10/5 with adequate volumes achieved. After 30 seconds pt with continued apneas despite RT coaching pt to breathe. Pt returned to full support at this time. VS remain stable. RT will continue to monitor and be available as needed.

## 2020-10-14 NOTE — Death Summary Note (Signed)
DEATH SUMMARY   Patient Details  Name: Dominic Phillips MRN: [id number] DOB: 08/17/1960  Admission/Discharge Information   Admit Date:  10/26/2020  Date of Death: Date of Death: 11-03-20  Time of Death: Time of Death: 1600  Length of Stay: 8  Referring Physician: Amado Nash, MD   Reason(s) for Hospitalization  shock  Diagnoses  Preliminary cause of death:  Secondary Diagnoses (including complications and co-morbidities):  Principal Problem:   Protein-calorie malnutrition, severe Active Problems:   Pressure injury of skin   Shock ([place])   Septic shock ([place])   Acute renal failure (ARF) ([place])   Cirrhosis ([place])   DNR (do not resuscitate)   [place] [facility] Course (including significant findings, care, treatment, and services provided and events leading to death)  Dominic Phillips is a 60 y.o. year old male who was admitted with septic shock and renal failure. Intubated for respiratory distress. Placed on broad spectrum antibiotics. Persistent pressor requirement that did not improve. Due to malnutrition developed anasarca. Anasarca and malnutrition yielded skin blisters and break down that required debridement. He continued to require life support despite aggressive care. GOC with PCCM and palliative determined he would not want to live on life support he was made comfort care and terminally extubated the PM 11/04/22. He passed away shortly after.     Pertinent Labs and Studies  Significant Diagnostic Studies CT ABDOMEN PELVIS WO CONTRAST  Result Date: 09/30/2020 CLINICAL DATA:  Retroperitoneal hematoma EXAM: CT ABDOMEN AND PELVIS WITHOUT CONTRAST TECHNIQUE: Multidetector CT imaging of the abdomen and pelvis was performed following the standard protocol without IV contrast. COMPARISON:  06/22/2017 FINDINGS: Lower chest: Small bilateral pleural effusions are partially visualized with associated bibasilar compressive atelectasis. Superimposed bilateral peripheral ground-glass pulmonary  infiltrates are noted, likely infectious in the acute setting. Hypoattenuation of the cardiac blood pool is in keeping with at least moderate anemia. No pericardial effusion peer Hepatobiliary: No focal liver abnormality is seen. No gallstones, gallbladder wall thickening, or biliary dilatation. Pancreas: Unremarkable Spleen: Unremarkable Adrenals/Urinary Tract: The adrenal glands are unremarkable. There is mild left hydronephrosis which has progressed slightly to the level of the crossing iliac vasculature. No intrarenal or ureteral calculi are identified and this may represent the sequela of an intrinsic stricture or obstructing mucosal lesion. There has developed marked left renal cortical atrophy since prior examination. The right kidney is normal in size and position. No hydronephrosis on the right. The bladder is decompressed with a Foley catheter balloon seen within the bladder lumen. Stomach/Bowel: Nasogastric tube tip extends into the juncture of the first and second portion of the duodenum. There is a partial small bowel obstruction secondary to a knuckle of small bowel which has herniated through a right paraumbilical anterior abdominal wall hernia, best seen on axial image # 61 and coronal image # 13. The proximal small bowel is mildly dilated and fluid-filled. Distally, the bowel appears decompressed. 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 There is decompression of the colon. Despite this, there is superimposed circumferential bowel wall thickening and pericolonic inflammatory stranding involving the cecum and ascending colon in keeping with a superimposed infectious or inflammatory colitis. No evidence of perforation. No free intraperitoneal gas. Trace ascites. Vascular/Lymphatic: There is moderate aortoiliac atherosclerotic calcification. No aortic aneurysm. No pathologic adenopathy within the abdomen and pelvis. Reproductive: Prostate is unremarkable. Other:  There is diffuse body wall subcutaneous edema in keeping with anasarca. Rectum unremarkable. No retroperitoneal hematoma identified. Musculoskeletal: No acute bone abnormality. No lytic  or blastic bone lesion. IMPRESSION: Bibasilar pulmonary infiltrates, likely infectious in etiology. Associated small bilateral pleural effusions and bibasilar compressive atelectasis. Partial small bowel obstruction with point of transition involving single loop of bowel herniated within a right paraumbilical hernia. Proximal small bowel appears mildly distended and fluid-filled. 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 Circumferential bowel wall thickening and pericolonic inflammatory stranding involving the ascending colon in keeping with an infectious or inflammatory colitis. No evidence of perforation. Diffuse body wall subcutaneous edema in keeping with anasarca. Mild left hydronephrosis, progressive since prior examination, now with marked left renal cortical atrophy, new since prior examination suggesting a longstanding obstruction. No superimposed nephro or urolithiasis suggests an obstructing intrinsic stricture or possible mucosal lesion. Correlation with ureteroscopy may be helpful for further evaluation. Aortic Atherosclerosis (ICD10-I70.0). Electronically Signed   By: Fidela Salisbury MD   On: 09/30/2020 21:50   DG Chest 1 View  Result Date: 09/30/2020 CLINICAL DATA:  Nasogastric tube placement EXAM: CHEST  1 VIEW COMPARISON:  Chest x-ray dated 10/01/2020. FINDINGS: Slightly worsened airspace opacities throughout the periphery of the RIGHT lung. Worsening bibasilar opacities, likely pleural effusions and/or atelectasis. LEFT upper lung remains clear. LEFT IJ central line is adequately positioned with tip at the level of the mid/upper SVC. Enteric tube passes below the diaphragm. No pneumothorax is seen. IMPRESSION: 1. Slightly worsened airspace opacities throughout the periphery of the  RIGHT lung, compatible with worsening pneumonia versus asymmetric pulmonary edema. 2. Increased bibasilar opacities, likely pleural effusions and/or atelectasis. Electronically Signed   By: Franki Cabot M.D.   On: 09/30/2020 10:14   DG Chest 1 View  Result Date: 10/06/2020 CLINICAL DATA:  Check central line placement EXAM: CHEST  1 VIEW COMPARISON:  09/16/2020 FINDINGS: Cardiac shadow is stable. New left jugular central line is noted in the mid superior vena cava. Patchy airspace opacity is noted within the right lung increased when compared with the prior exam. No pneumothorax is noted. Left lung remains clear IMPRESSION: Increasing infiltrate in the right lung. Status post left central venous line without pneumothorax. Electronically Signed   By: Inez Catalina M.D.   On: 09/20/2020 21:49   DG Abd 1 View  Result Date: 10/01/2020 CLINICAL DATA:  Abdominal distension EXAM: ABDOMEN - 1 VIEW COMPARISON:  09/30/2020 FINDINGS: Enteric tube remains present with tip overlying the distal stomach. Bowel-gas pattern remains unremarkable but overall paucity of bowel gas. Left lower quadrant ostomy. IMPRESSION: Nonobstructive bowel gas pattern. Electronically Signed   By: Macy Mis M.D.   On: 10/01/2020 11:53   DG Abd 1 View  Result Date: 09/30/2020 CLINICAL DATA:  Is a gastric tube placement. EXAM: ABDOMEN - 1 VIEW COMPARISON:  None. FINDINGS: Enteric tube appears well positioned in the stomach with tip directed towards the stomach pylorus/duodenal bulb. [place] bowel gas within the upper abdomen. No evidence of free intraperitoneal air. IMPRESSION: Nasogastric tube appears well positioned in the stomach. Electronically Signed   By: Franki Cabot M.D.   On: 09/30/2020 10:14   CT HEAD WO CONTRAST  Result Date: 10/04/2020 CLINICAL DATA:  Altered mental status. EXAM: CT HEAD WITHOUT CONTRAST TECHNIQUE: Contiguous axial images were obtained from the base of the skull through the vertex without intravenous  contrast. COMPARISON:  September 30, 2020. FINDINGS: Brain: No evidence of acute infarction, hemorrhage, hydrocephalus, extra-axial collection or mass lesion/mass effect. Vascular: No hyperdense vessel or unexpected calcification. Skull: Normal. Negative for fracture or focal lesion. Sinuses/Orbits: No acute finding. Other: None. IMPRESSION: Normal  head CT. Electronically Signed   By: Lupita RaiderJames  Green Jr M.D.   On: 10/04/2020 17:20   CT HEAD WO CONTRAST  Result Date: 09/30/2020 CLINICAL DATA:  Acute neurologic deficit, stroke EXAM: CT HEAD WITHOUT CONTRAST TECHNIQUE: Contiguous axial images were obtained from the base of the skull through the vertex without intravenous contrast. COMPARISON:  None. FINDINGS: Brain: Normal anatomic configuration. No abnormal intra or extra-axial mass lesion or fluid collection. No abnormal mass effect or midline shift. No evidence of acute intracranial hemorrhage or infarct. Ventricular size is normal. Cerebellum unremarkable. Vascular: Unremarkable Skull: Intact Sinuses/Orbits: Paranasal sinuses are clear. Orbits are unremarkable. Other: Mastoid air cells and middle ear cavities are clear. Endotracheal tube and nasogastric tube are partially visualized. IMPRESSION: No acute intracranial abnormality. Electronically Signed   By: Helyn NumbersAshesh  Parikh MD   On: 09/30/2020 21:36   CT Head Wo Contrast  Result Date: 21-Jun-2020 CLINICAL DATA:  Recent fall with headaches and neck pain, initial encounter EXAM: CT HEAD WITHOUT CONTRAST CT CERVICAL SPINE WITHOUT CONTRAST TECHNIQUE: Multidetector CT imaging of the head and cervical spine was performed following the standard protocol without intravenous contrast. Multiplanar CT image reconstructions of the cervical spine were also generated. COMPARISON:  04/10/2017 FINDINGS: CT HEAD FINDINGS Brain: No evidence of acute infarction, hemorrhage, hydrocephalus, extra-axial collection or mass lesion/mass effect. Vascular: No hyperdense vessel or unexpected  calcification. Skull: Normal. Negative for fracture or focal lesion. Sinuses/Orbits: No acute finding. Other: None. CT CERVICAL SPINE FINDINGS Alignment: Within normal limits. Skull base and vertebrae: 7 cervical segments are well visualized. Vertebral body height is well maintained. Mild facet hypertrophic changes are noted. No acute fracture or acute facet abnormality is noted. Soft tissues and spinal canal: Surrounding soft tissue structures are within normal limits. No hematoma is seen. Upper chest: Visualized lung apices are within normal limits. Other: None IMPRESSION: CT of the head: No acute intracranial abnormality noted. CT of the cervical spine: Multilevel degenerative change in the facet joints without acute abnormality. Electronically Signed   By: Alcide CleverMark  Lukens M.D.   On: 006-Jan-2022 16:47   CT Cervical Spine Wo Contrast  Result Date: 21-Jun-2020 CLINICAL DATA:  Recent fall with headaches and neck pain, initial encounter EXAM: CT HEAD WITHOUT CONTRAST CT CERVICAL SPINE WITHOUT CONTRAST TECHNIQUE: Multidetector CT imaging of the head and cervical spine was performed following the standard protocol without intravenous contrast. Multiplanar CT image reconstructions of the cervical spine were also generated. COMPARISON:  04/10/2017 FINDINGS: CT HEAD FINDINGS Brain: No evidence of acute infarction, hemorrhage, hydrocephalus, extra-axial collection or mass lesion/mass effect. Vascular: No hyperdense vessel or unexpected calcification. Skull: Normal. Negative for fracture or focal lesion. Sinuses/Orbits: No acute finding. Other: None. CT CERVICAL SPINE FINDINGS Alignment: Within normal limits. Skull base and vertebrae: 7 cervical segments are well visualized. Vertebral body height is well maintained. Mild facet hypertrophic changes are noted. No acute fracture or acute facet abnormality is noted. Soft tissues and spinal canal: Surrounding soft tissue structures are within normal limits. No hematoma is seen.  Upper chest: Visualized lung apices are within normal limits. Other: None IMPRESSION: CT of the head: No acute intracranial abnormality noted. CT of the cervical spine: Multilevel degenerative change in the facet joints without acute abnormality. Electronically Signed   By: Alcide CleverMark  Lukens M.D.   On: 006-Jan-2022 16:47   DG Chest Port 1 View  Result Date: 21-Jun-2020 CLINICAL DATA:  Question sepsis EXAM: PORTABLE CHEST 1 VIEW COMPARISON:  December 31, 2017 FINDINGS: The heart size and mediastinal  contours are within normal limits. Hazy ground-glass opacity is identified in the right lateral mid lung. The left lung is clear. There is a calcified granuloma in the left lung base. There is no pleural effusion or pulmonary edema. The visualized skeletal structures are unremarkable. IMPRESSION: Hazy ground-glass opacity identified in the right lateral mid lung, developing pneumonia is not excluded. Electronically Signed   By: Abelardo Diesel M.D.   On: 09/28/2020 14:31   ECHOCARDIOGRAM COMPLETE  Result Date: 09/30/2020    ECHOCARDIOGRAM REPORT   Patient Name:   Dominic Phillips Date of Exam: 09/30/2020 Medical Rec #:  [id number]     Height:       69.0 in Accession #:    [id number]    Weight:       123.9 lb Date of Birth:  1960-07-19     BSA:          1.686 m Patient Age:    24 years      BP:           111/61 mmHg Patient Gender: M             HR:           90 bpm. Exam Location:  Inpatient Procedure: 2D Echo, Cardiac Doppler, Color Doppler and Intracardiac            Opacification Agent                           STAT ECHO Reported to: Dr Rudean Haskell on 09/30/2020 9:10:00 AM. Indications:    Abnormal ECG  History:        Patient has prior history of Echocardiogram examinations, most                 recent 04/12/2017. COPD; Risk Factors:Current Smoker. ETOH.  Sonographer:    Clayton Lefort RDCS (AE) Referring Phys: [id number] Frederik Pear  Sonographer Comments: Technically difficult study due to poor echo windows. Image  acquisition challenging due to COPD and Image acquisition challenging due to respiratory motion. Patient supine. IMPRESSIONS  1. Left ventricular ejection fraction, by estimation, is 50 to 55%. The left ventricle has low normal function. The left ventricle has no regional wall motion abnormalities. There is mild concentric left ventricular hypertrophy. Left ventricular diastolic parameters are indeterminate.  2. Right ventricular systolic function is normal. The right ventricular size is normal. There is normal pulmonary artery systolic pressure.  3. The mitral valve is grossly normal. No evidence of mitral valve regurgitation. No evidence of mitral stenosis.  4. The aortic valve is tricuspid. Aortic valve regurgitation is not visualized. No aortic stenosis is present.  5. The inferior vena cava is normal in size with greater than 50% respiratory variability, suggesting right atrial pressure of 3 mmHg.  6. Cannot rule out right to left shunting by Color Doppler. Consider bubble study in future study only if clinically indicated. Comparison(s): A prior study was performed on 04/12/2017. Prior images reviewed side by side. Slight decrease in LV function from 2018 study (prior study performed without contrast on ventilator). FINDINGS  Left Ventricle: No LVOTO. Left ventricular ejection fraction, by estimation, is 50 to 55%. The left ventricle has low normal function. The left ventricle has no regional wall motion abnormalities. Definity contrast agent was given IV to delineate the left ventricular endocardial borders. The left ventricular internal cavity size was normal in size. There is mild concentric left ventricular hypertrophy. Left ventricular  diastolic parameters are indeterminate. Right Ventricle: The right ventricular size is normal. No increase in right ventricular wall thickness. Right ventricular systolic function is normal. There is normal pulmonary artery systolic pressure. The tricuspid regurgitant  velocity is 2.63 m/s, and  with an assumed right atrial pressure of 3 mmHg, the estimated right ventricular systolic pressure is [id number] mmHg. Left Atrium: Left atrial size was normal in size. Right Atrium: Right atrial size was normal in size. Pericardium: There is no evidence of pericardial effusion. Mitral Valve: The mitral valve is grossly normal. No evidence of mitral valve regurgitation. No evidence of mitral valve stenosis. MV peak gradient, 3.2 mmHg. The mean mitral valve gradient is 1.0 mmHg. Tricuspid Valve: The tricuspid valve is normal in structure. Tricuspid valve regurgitation is mild. Aortic Valve: The aortic valve is tricuspid. Aortic valve regurgitation is not visualized. No aortic stenosis is present. Aortic valve mean gradient measures 2.5 mmHg. Aortic valve peak gradient measures 5.0 mmHg. Aortic valve area, by VTI measures 2.31 cm. Pulmonic Valve: The pulmonic valve was not well visualized. Pulmonic valve regurgitation is not visualized. No evidence of pulmonic stenosis. Aorta: The aortic root and ascending aorta are structurally normal, with no evidence of dilitation. Venous: The inferior vena cava is normal in size with greater than 50% respiratory variability, suggesting right atrial pressure of 3 mmHg. IAS/Shunts: Cannot rule out right to left shunting by Color Doppler.  LEFT VENTRICLE PLAX 2D LVIDd:         4.30 cm LVIDs:         3.30 cm LV PW:         1.10 cm LV IVS:        1.00 cm LVOT diam:     1.90 cm LV SV:         41 LV SV Index:   24 LVOT Area:     2.84 cm  RIGHT VENTRICLE             IVC RV Basal diam:  3.10 cm     IVC diam: 1.80 cm RV S prime:     11.60 cm/s LEFT ATRIUM             Index       RIGHT ATRIUM           Index LA diam:        3.00 cm 1.78 cm/m  RA Area:     12.90 cm LA Vol (A2C):   25.7 ml 15.25 ml/m RA Volume:   34.90 ml  20.71 ml/m LA Vol (A4C):   17.7 ml 10.50 ml/m LA Biplane Vol: 22.0 ml 13.05 ml/m  AORTIC VALVE AV Area (Vmax):    2.43 cm AV Area (Vmean):    2.49 cm AV Area (VTI):     2.31 cm AV Vmax:           111.85 cm/s AV Vmean:          74.800 cm/s AV VTI:            0.178 m AV Peak Grad:      5.0 mmHg AV Mean Grad:      2.5 mmHg LVOT Vmax:         95.90 cm/s LVOT Vmean:        65.800 cm/s LVOT VTI:          0.145 m LVOT/AV VTI ratio: 0.81  AORTA Ao Root diam: 3.20 cm Ao Asc diam:  3.10 cm MITRAL VALVE  TRICUSPID VALVE MV Area (PHT): 4.15 cm    TR Peak grad:   27.7 mmHg MV Area VTI:   1.98 cm    TR Vmax:        263.00 cm/s MV Peak grad:  3.2 mmHg MV Mean grad:  1.0 mmHg    SHUNTS MV Vmax:       0.89 m/s    Systemic VTI:  0.14 m MV Vmean:      57.8 cm/s   Systemic Diam: 1.90 cm MV Decel Time: 183 msec MV E velocity: 79.10 cm/s MV A velocity: 81.00 cm/s MV E/A ratio:  0.98 Rudean Haskell MD Electronically signed by Rudean Haskell MD Signature Date/Time: 09/30/2020/9:29:27 AM    Final    US SCROTUM W/DOPPLER  Result Date: 10/01/2020 CLINICAL DATA:  Scrotal hematoma, rapid discoloration and swelling of the testes. EXAM: SCROTAL ULTRASOUND DOPPLER ULTRASOUND OF THE TESTICLES TECHNIQUE: Complete ultrasound examination of the testicles, epididymis, and other scrotal structures was performed. Color and spectral Doppler ultrasound were also utilized to evaluate blood flow to the testicles. COMPARISON:  CT 09/30/2020 FINDINGS: Right testicle Measurements: 3.2 x 2 x 2.1 cm. No mass or microlithiasis visualized. Left testicle Measurements: 3.8 x 2.1 x 2.1 cm. No mass or microlithiasis visualized. Right epididymis:  Normal in size and appearance. Left epididymis:  Normal in size and appearance. Hydrocele:  Trace bilateral hydrocele, left greater than right. Varicocele:  None visualized. Pulsed Doppler interrogation of both testes demonstrates normal low resistance arterial and venous waveforms bilaterally. Other: There is severe bilateral edematous scrotal skin thickening measuring up to 2.4 cm on the right and 2.2 cm on the left. No organized  fluid collection or mass is seen. Sonographer noted a taut and violaceous appearance on exam. IMPRESSION: 1. Severe bilateral scrotal skin thickening without organized fluid collection or mass. 2. Trace bilateral hydrocele, left greater than right. 3. No sonographic evidence of testicular torsion or epididymo-orchitis. Electronically Signed   By: Lovena Le M.D.   On: 10/01/2020 05:44   [place] EKG SITE RITE  Result Date: 10-12-20 If Site Rite image not attached, placement could not be confirmed due to current cardiac rhythm.   Microbiology Recent Results (from the past 240 hour(s))  Resp Panel by RT-PCR (Flu A&B, Covid) Nasopharyngeal Swab     Status: None   Collection Time: 10/05/2020  2:14 PM   Specimen: Nasopharyngeal Swab; Nasopharyngeal(NP) swabs in vial transport medium  Result Value Ref Range Status   SARS Coronavirus 2 by RT PCR NEGATIVE NEGATIVE Final    Comment: (NOTE) SARS-CoV-2 target nucleic acids are NOT DETECTED.  The SARS-CoV-2 RNA is generally detectable in upper respiratory specimens during the acute phase of infection. The lowest concentration of SARS-CoV-2 viral copies this assay can detect is 138 copies/mL. A negative result does not preclude SARS-Cov-2 infection and should not be used as the sole basis for treatment or other patient management decisions. A negative result may occur with  improper specimen collection/handling, submission of specimen other than nasopharyngeal swab, presence of viral mutation(s) within the areas targeted by this assay, and inadequate number of viral copies(<138 copies/mL). A negative result must be combined with clinical observations, patient history, and epidemiological information. The expected result is Negative.  Fact Sheet for Patients:  EntrepreneurPulse.com.au  Fact Sheet for Healthcare Providers:  IncredibleEmployment.be  This test is no t yet approved or cleared by the [place] FDA  and  has been authorized for detection and/or diagnosis of SARS-CoV-2 by FDA under an  Emergency Use Authorization (EUA). This EUA will remain  in effect (meaning this test can be used) for the duration of the COVID-19 declaration under Section 564(b)(1) of the Act, 21 U.S.C.section 360bbb-3(b)(1), unless the authorization is terminated  or revoked sooner.       Influenza A by PCR NEGATIVE NEGATIVE Final   Influenza B by PCR NEGATIVE NEGATIVE Final    Comment: (NOTE) The Xpert Xpress SARS-CoV-2/FLU/RSV plus assay is intended as an aid in the diagnosis of influenza from Nasopharyngeal swab specimens and should not be used as a sole basis for treatment. Nasal washings and aspirates are unacceptable for Xpert Xpress SARS-CoV-2/FLU/RSV testing.  Fact Sheet for Patients: EntrepreneurPulse.com.au  Fact Sheet for Healthcare Providers: IncredibleEmployment.be  This test is not yet approved or cleared by the [place] FDA and has been authorized for detection and/or diagnosis of SARS-CoV-2 by FDA under an Emergency Use Authorization (EUA). This EUA will remain in effect (meaning this test can be used) for the duration of the COVID-19 declaration under Section 564(b)(1) of the Act, 21 U.S.C. section 360bbb-3(b)(1), unless the authorization is terminated or revoked.  Performed at [facility], [place] [address], [place], [place] [address]   Urine culture     Status: Abnormal   Collection Time: 09/22/2020  2:50 PM   Specimen: Urine, Catheterized  Result Value Ref Range Status   Specimen Description URINE, CATHETERIZED  Final   Special Requests   Final    NONE Performed at [facility], [place] [address] [place], [place] [address]    Culture (A)  Final    >=100,000 COLONIES/mL MULTIPLE SPECIES PRESENT, SUGGEST RECOLLECTION   Report Status 09/30/2020 FINAL  Final  Culture, blood (routine x 2)     Status: None   Collection Time: 09/15/2020   3:28 PM   Specimen: BLOOD RIGHT HAND  Result Value Ref Range Status   Specimen Description BLOOD RIGHT HAND  Final   Special Requests   Final    BOTTLES DRAWN AEROBIC AND ANAEROBIC Blood Culture results may not be optimal due to an inadequate volume of blood received in culture bottles   Culture   Final    NO GROWTH 5 DAYS Performed at [facility], [place] [address] [place], [place] [address]    Report Status 10/04/2020 FINAL  Final  Urine culture     Status: Abnormal   Collection Time: 10/01/2020  3:43 PM   Specimen: Urine, Random  Result Value Ref Range Status   Specimen Description URINE, RANDOM  Final   Special Requests   Final    NONE Performed at [facility], [place] [address] [place], [place] [address]    Culture MULTIPLE SPECIES PRESENT, SUGGEST RECOLLECTION (A)  Final   Report Status 10/01/2020 FINAL  Final  Blood culture (routine single)     Status: None   Collection Time: 09/16/2020  6:20 PM   Specimen: BLOOD RIGHT HAND  Result Value Ref Range Status   Specimen Description BLOOD RIGHT HAND  Final   Special Requests   Final    BOTTLES DRAWN AEROBIC ONLY Blood Culture results may not be optimal due to an inadequate volume of blood received in culture bottles   Culture   Final    NO GROWTH 5 DAYS Performed at [facility], [place] [address], [place], [place] [address]    Report Status 10/04/2020 FINAL  Final  Culture, blood (routine x 2)     Status:  None   Collection Time: 10/13/2020  8:49 PM   Specimen: Blood  Result Value Ref Range Status   Specimen Description BLOOD LEFT HAND  Final   Special Requests   Final    BOTTLES DRAWN AEROBIC AND ANAEROBIC Blood Culture results may not be optimal due to an inadequate volume of blood received in culture bottles   Culture   Final    NO GROWTH 5 DAYS Performed at [facility], [place] [address], [place], [place] [address]    Report Status 10/04/2020 FINAL  Final  MRSA PCR Screening     Status: None   Collection  Time: 09/30/20 10:58 PM   Specimen: Nasal Mucosa; Nasopharyngeal  Result Value Ref Range Status   MRSA by PCR NEGATIVE NEGATIVE Final    Comment:        The GeneXpert MRSA Assay (FDA approved for NASAL specimens only), is one component of a comprehensive MRSA colonization surveillance program. It is not intended to diagnose MRSA infection nor to guide or monitor treatment for MRSA infections. Performed at [facility], [place] [address] [place], [place] [address]   Stool culture     Status: None   Collection Time: 09/30/20 11:00 PM  Result Value Ref Range Status   Salmonella/Shigella Screen Final report  Final   Campylobacter Culture Final report  Final   E coli, Shiga toxin Assay Negative Negative Final    Comment: (NOTE) Performed At: [facility] [place], [place] [id number] Rush Farmer MD [id number]   STOOL CULTURE REFLEX - RSASHR     Status: None   Collection Time: 09/30/20 11:00 PM  Result Value Ref Range Status   Stool Culture result 1 (RSASHR) Comment  Final    Comment: (NOTE) No Salmonella or Shigella recovered. Performed At: [facility] [place], [place] [id number] Rush Farmer MD [id number]   STOOL CULTURE Reflex - CMPCXR     Status: None   Collection Time: 09/30/20 11:00 PM  Result Value Ref Range Status   Stool Culture result 1 (CMPCXR) Comment  Final    Comment: (NOTE) No Campylobacter species isolated. Performed At: [facility] [place], [place] [id number] Rush Farmer MD [id number]   C Difficile Quick Screen w PCR reflex     Status: None   Collection Time: 10/01/20 10:59 AM   Specimen: Stool  Result Value Ref Range Status   C Diff antigen NEGATIVE NEGATIVE Final   C Diff toxin NEGATIVE NEGATIVE Final   C Diff interpretation No C. difficile detected.  Final    Comment: Performed at [facility], [place] [address] [place], [place] [address]    Lab Basic  Metabolic Panel: Recent Labs  Lab 10/02/20 2226 10/03/20 0352 10/03/20 1600 10/05/20 0400 10/05/20 1626 10/06/20 0200 10/06/20 1342 2020/10/23 0330  NA 128* 129*   < > 138 142 145 147* 150*  K 3.6 3.4*   < > 2.0* 2.2* 2.6* 4.4 3.4*  CL 97* 97*   < > 100 101 105 110 110  CO2 19* 19*   < > 26 31 30 28 30   GLUCOSE 151* 155*   < > 168* 129* 161* 155* 165*  BUN 30* 31*   < > 52* 51* 49* 43* 39*  CREATININE 1.55* 1.52*   < > 1.27* 1.07 0.91 0.76 0.67  CALCIUM 6.6* 6.7*   < > 7.7* 7.6* 7.4* 7.6* 7.8*  MG 2.0 2.1  --  2.0 1.7 2.2  --  1.6*  PHOS 2.8 2.6  --  2.3*  --  2.4*  --  2.4*   < > = values in this interval not displayed.   Liver Function Tests: Recent Labs  Lab 09/30/20 1954 10/01/20 0419 10/02/20 1506  AST 39 64* 43*  ALT 18 21 22   ALKPHOS 98 85 73  BILITOT 1.1 1.0 1.5*  PROT 3.5* 3.8* 4.1*  ALBUMIN 1.5* 1.7* 2.2*   No results for input(s): LIPASE, AMYLASE in the last 168 hours. Recent Labs  Lab 10/01/20 1306  AMMONIA 101*   CBC: Recent Labs  Lab 09/30/20 1954 09/30/20 2014 10/03/20 0352 10/04/20 0500 10/05/20 0400 10/06/20 0200 18-Oct-2020 0330  WBC 17.5*   < > 17.5* 15.1* 13.5* 16.5* 15.2*  NEUTROABS 15.6*  --   --  13.3*  --   --   --   HGB 7.6*   < > 6.6* 8.1* 7.6* 7.7* 7.7*  HCT RESULTS UNAVAILABLE DUE TO INTERFERING SUBSTANCE   < > 18.7* 22.5* 22.1* 23.1* 24.4*  MCV RESULTS UNAVAILABLE DUE TO INTERFERING SUBSTANCE   < > 92.1 89.6 92.9 98.3 103.4*  PLT 52*   < > 15*  17* 48* 68* 100* 133*   < > = values in this interval not displayed.   Cardiac Enzymes: Recent Labs  Lab 10/01/20 1706 10/02/20 0459 10/02/20 1506  CKTOTAL 665* 465* 441*   Sepsis Labs: Recent Labs  Lab 10/02/20 1428 10/02/20 2156 10/03/20 0352 10/03/20 0357 10/04/20 0500 10/05/20 0400 10/06/20 0200 10-18-2020 0330  WBC  --   --    < >  --  15.1* 13.5* 16.5* 15.2*  LATICACIDVEN 5.4* 4.2*  --  3.9* 1.9  --   --   --    < > = values in this interval not displayed.     Procedures/Operations  As per EMR   Bonna Gains Shawnell Dykes 10/18/20, 5:30 PM

## 2020-10-14 NOTE — Progress Notes (Signed)
Spoke with Dr Silas Flood on 2 M re PICC placement with febrile status, removing CVC for potential infectious source.  Peripheral access not suitable for Levo infusion due to edema and significant bruising. Dr Silas Flood states ok to proceed with PICC placement due to inablity to have a line holiday. Also orders to proceed under medical necessity due to inability to contact POA.

## 2020-10-14 NOTE — Progress Notes (Signed)
Pt meets criteria for ELink electrolyte protocol. K+ and Mag replaced.

## 2020-10-14 NOTE — Procedures (Signed)
Extubation Procedure Note  Patient Details:   Name: Dominic Phillips DOB: 15-Jun-1961 MRN: [id number]   Airway Documentation:    Vent end date: 09/30/2020 Vent end time: 1510   Evaluation  O2 sats: currently acceptable Complications: No apparent complications Patient did tolerate procedure well. Bilateral Breath Sounds: Clear,Diminished   No   Pt extubated per physician order and family request. Pt currently on comfort care measures. Family remained at bedside. Pt tolerated well, no further needs at this time. RT will continue to be available as needed.   Sharla Kidney 09/15/2020, 4:28 PM

## 2020-10-14 NOTE — Progress Notes (Signed)
Patient showing asystole on monitor. Family at bedside. No heart sounds x 1 min, no breath sounds x 1 min, pupils non reactive. Second check by Lyla Son, RN.

## 2020-10-14 NOTE — Progress Notes (Signed)
Palliative Medicine Inpatient Follow Up Note  Reason for consult:  Discuss goals of care  HPI:  Per intake H&P --> 60 year old male with history significant for ETOH use, fall 10 days prior to admission, presented 4/16 to ED with encephalopathy, generalized weakness and found to be in shock with severe electrolyte imbalance. Dominic Phillips now with multi-system organ failure, FTT --> d/t ongoing with N/V, diarrhea.  Palliative care was asked to get involved to further discuss goals of care given patient poor prognosis. Dominic Phillips was seen by the PMT in 2018   Today's Discussion (11-05-20):  *Please note that this is a verbal dictation therefore any spelling or grammatical errors are due to the "[place] [employer]" system interpretation.  Chart reviewed.  I met with patient at bedside he was more somnolent today and less interactive.  Patient's 3 sisters were noted to be in the waiting area per discussion with patient's RN, Dominic Phillips.  I spoke with the patient's sisters and they all expressed that Dominic Phillips is not doing well overall.  Patient's sister shared that seeing his wounds was a very defining moment helping him identify that comfort may be the best next step.  Patient sisters expressed some discord amongst family members and more specifically with patient's daughter, Dominic Phillips which they are trying to work out amongst themselves.  We reviewed the plan for a meeting once Dominic Phillips comes into the hospital later today. _______________________________________________________________________________________ Addendum:  Afternoon I met with patient's daughter Dominic Phillips at bedside.  We reviewed patient's current comorbidities and went through each hospital problems one by one as well as all radiographic studies this admission.  We then carefully reviewed the forms of life support Dominic Phillips is presently on inclusive of pressor medications and ventilatory support.  Review Dominic Phillips' multisystem organ dysfunction.  Discussed in  detail the poor quality of life Dominic Phillips has had since 2018.  Reviewed that he had clearly suffered from existential factors as well as internal factors.  Dominic Phillips is very tearful during our conversation and has to excuse herself.  I was able to asked the chaplain, Dominic Phillips to come by to pray with patient's family.  Dr. Silas Phillips came by to provide Dominic Phillips and his daughter and his sister a medical update as well as confirmed agreement that comfort focused care may be the best next path for him.  Formal review of comfort oriented care was discussed.We talked about transition to comfort measures in house and what that would entail inclusive of medications to control pain, dyspnea, agitation, nausea, itching, and hiccups.  We discussed stopping all uneccessary measures such as mechanical ventilatory support, NGT feeding, blood draws, needle sticks, and frequent vital signs.   Patient's daughter, Dominic Phillips was in agreement with this plan.  We reviewed that compassionate extubation will not be pursued until after Dominic Phillips's family has had adequate time with him.  Emotional support provided through therapeutic listening.  Questions and concerns addressed   Objective Assessment: Vital Signs Vitals:   Nov 05, 2020 1000 Nov 05, 2020 1228  BP: (!) 95/56   Pulse: 95   Resp: 20   Temp:  (!) 103.1 F (39.5 C)  SpO2: 100%     Intake/Output Summary (Last 24 hours) at Nov 05, 2020 1322 Last data filed at 11-05-20 1100 Gross per 24 hour  Intake 3402.08 ml  Output 5100 ml  Net -1697.92 ml   Last Weight  Most recent update: 11/05/20  3:23 AM   Weight  57.2 kg (126 lb 1.7 oz)  Gen:  Very ill appearing M HEENT: Dry mucous membranes, ETT tube and coretrack in place CV: Irregular rate and rhythm  PULM:  On ventilator ABD: soft/nontender  EXT: RUE (+) ecchymosis, LLE (+) petechia Neuro: Alert to move eyes to track moves head to voice  [place] for compassionate extubation  this afternoon  Unrestricted visitation  Comfort medications per [facility]  Ongoing palliative care support in the oncoming days  Time Spent: 98 minutes Greater than 50% of the time was spent in counseling and coordination of care ______________________________________________________________________________________ [place] Team Team Cell Phone: [phone] Please utilize secure chat with additional questions, if there is no response within 30 minutes please call the above phone number  Palliative Medicine Team providers are available by phone from 7am to 7pm daily and can be reached through the team cell phone.  Should this patient require assistance outside of these hours, please call the patient's attending physician.

## 2020-10-14 NOTE — Progress Notes (Signed)
Peripherally Inserted Central Catheter Placement  The IV Nurse has discussed with the patient and/or persons authorized to consent for the patient, the purpose of this procedure and the potential benefits and risks involved with this procedure.  The benefits include less needle sticks, lab draws from the catheter, and the patient may be discharged home with the catheter. Risks include, but not limited to, infection, bleeding, blood clot (thrombus formation), and puncture of an artery; nerve damage and irregular heartbeat and possibility to perform a PICC exchange if needed/ordered by physician.  Alternatives to this procedure were also discussed.  Bard Power PICC patient education guide, fact sheet on infection prevention and patient information card has been provided to patient /or left at bedside.   PICC placed per medical necessity per Dr Silas Flood.  PICC Placement Documentation  PICC Double Lumen 64/40/34 PICC Right Basilic 36 cm 0 cm (Active)  Indication for Insertion or Continuance of Line Vasoactive infusions 10/04/2020 1000  Exposed Catheter (cm) 0 cm 09/17/2020 1000  Site Assessment Clean;Dry;Intact 09/15/2020 1000  Lumen #1 Status Flushed;Saline locked;Blood return noted 09/26/2020 1000  Lumen #2 Status Flushed;Saline locked;Blood return noted 09/16/2020 1000  Dressing Type Transparent;Securing device 09/28/2020 1000  Dressing Status Clean;Dry;Intact 10/09/2020 1000  Antimicrobial disc in place? Yes 09/25/2020 1000  Safety Lock Not Applicable 74/25/95 6387  Line Care Connections checked and tightened 10/03/2020 1000  Dressing Intervention New dressing 09/23/2020 1000  Dressing Change Due 10/14/20 10/11/2020 1000       Rolena Infante 09/28/2020, 10:48 AM

## 2020-10-14 NOTE — Progress Notes (Signed)
Patient family forgot 2 photos in frames, sent downstairs with body.

## 2020-10-14 NOTE — Progress Notes (Addendum)
NAME:  Latonya Knight, MRN:  [id number], DOB:  [birth year], LOS: 8 ADMISSION DATE:  10/13/2020, CONSULTATION DATE: 09/20/2020 REFERRING MD:  Quintella Reichert, CHIEF COMPLAINT: Altered mental status  History of Present Illness:  60 year old male with history significant for ETOH use, fall 10 days prior to admission, presented 4/16 to ED with encephalopathy, generalized weakness and found to be in shock with severe electrolyte imbalance.   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 4/16 admitted . 4/17 ETT . 4/18 INR 3.7, given 2 units of FFP.developed bruising to scrotum and left thigh  4/22 family meeting, based on his values ssupect comfort care would be best course of action, family agrees, would like to see if he can wake up and participate in decision 4/23 Diuresed, more alert Interim History / Subjective:  Diuresing well, Still volume up. Cr better.Catching up with K.   Objective   Blood pressure (!) 95/56, pulse 95, temperature (!) 101.2 F (38.4 C), temperature source Axillary, resp. rate 20, height _0  (1.753 m), weight 57.2 kg, SpO2 100 %.    Vent Mode: PRVC FiO2 (%):  [40 %] 40 % Set Rate:  [20 bmp] 20 bmp Vt Set:  [540 mL-560 mL] 540 mL PEEP:  [5 cmH20] 5 cmH20 Plateau Pressure:  [18 cmH20-19 cmH20] 19 cmH20   Intake/Output Summary (Last 24 hours) at 10/12/2020 1009 Last data filed at 12-Oct-2020 1000 Gross per 24 hour  Intake 3400.44 ml  Output 5000 ml  Net -1599.56 ml   Filed Weights   10/06/20 0030 10/06/20 0112 10-12-20 0323  Weight: 57.2 kg 57.2 kg 57.2 kg    Examination: Physical exam:  General: Chronically ill male, intubated and on no sedation  HEENT: AT/[place], NG and ETT in place, sclera edema   Neuro: arouses, attends,  improved Chest: Crackles noted, no secretions via ETT.   Heart: Tachy, no MRG Abdomen: Soft, bowel sounds present, Colostomy in place with dark output  Skin: Anasarca. Bruising noted to left lower flank and left lateral thigh with open blisters, scrotum with swelling and bruising, pitting edema noted to all extremities     Resolved Hospital Problem list   A. fib with RVR/nonsustained V. tach due to severe electrolyte imbalance Hypoglycemia, resolved with tube feeds  Assessment & Plan:   Septic and hypovolemic shock from aspiration pneumonia and severe dehydration Acute gastroenteritis infectious versus inflammatory -ECHO 4/17 with EF 50-55 -Strep P + -C.Diff Negative  Plan -Titrate Vasopressors for MAP goal >65 ,appears weanable per recorded Bps -Add midodrine -Stress dose steroids started 4/19-4/21 -Continue Rocephin, 7 day course.   Partial Small Bowel Obstruction  -CT A/P with Partial small bowel obstruction with point of transition involving single loop of bowel herniated within a right paraumbilical hernia. Proximal small bowel appears mildly distended and fluid-filled H/O colectomy with endcolostomy due to diverticulitis with perforation 2018  Plan - Colostomy with brown output, abdominal exam benign. No clinical signs of obstruction.   Anasarca Scrotal edema Plan -Patient is volume overloaded within the tissues. U/S Scrotum with trace bilateral hydrocele, left greater then right -Elevate scrotum -Wound care following -Diurese  Acute kidney injury due to severe dehydration High anion gap metabolic acidosis Plan - Creatinine 3.3 on presentation, downtrending with diuresis - Continue electrolyte replacement - Diurese  Acute hypoxic respiratory  failure due to aspiration pneumonia Tobacco dependence Plan -Continue Vent Support with lung protective strategies. Neurological exam prevents extubation and ability to protect airway but is imrpoving  Acute hepatic  encephalopathy Cirrhosis  -[facility] - [facility] 4/17 & 4/21:No acute intracranial pathology -lactulose and rifaxamin -Exam improving.   Alcohol abuse Plan -Continue Thiamine, folic acid and multivitamin   Coffee-ground emesis due to upper GI bleeding from possible Mallory-Weiss -Occult blood via stool + Plan -Continue BID PPI PO  Coagulopathy secondary to sepsis, liver dysfunction Plan  -Goal Hgb > 7 and plt > 20 -No transfusions required today  Hyponatremia, improving -Multifactorial with chronic ETOH use and volume overload  Fever, --replace foley, PICC and remove TLC 4/24 --Dopplers UE and LE 4/24  Skin breakdown -not present on admission. left lateral thigh with ruptured blisters, crosses upper abdomen. Wound care following  Best practice (right click and "Reselect all SmartList Selections" daily)  Diet:  NPO, tolerating tube feeds Pain/Anxiety/Delirium protocol (if indicated): No VAP protocol (if indicated): Not indicated DVT prophylaxis: SCD due to thrombocytopenia. Consider resuming tomorrow if continues to have plt recovery  GI prophylaxis: PPI Glucose control:  SSI No Central venous access:  Yes, and it is still needed Arterial line:  Yes, and it is still needed Foley:  Yes, and it is still needed Mobility:  bed rest  PT consulted: N/A Last date of multidisciplinary goals of care discussion: ADDENDUM: Met with daughter, sisters, granddaughter, friends. Long GOC discussion resulting in comfort care decision.  Code Status: DNR Disposition: ICU   Total critical care time: 60 minutes   Critical care time was exclusive of separately billable procedures and treating other patients.   Critical care was necessary to treat or prevent imminent or life-threatening deterioration.   Critical care was time spent personally by me on the following activities: development of treatment plan with patient and/or surrogate as well as nursing, discussions with consultants, evaluation of  patient's response to treatment, examination of patient, obtaining history from patient or surrogate, ordering and performing treatments and interventions, ordering and review of laboratory studies, ordering and review of radiographic studies, pulse oximetry and re-evaluation of patient's condition.  Lanier Clam, MD See Shea Evans for contact info

## 2020-10-14 DEATH — deceased
